# Patient Record
Sex: Female | Born: 1985 | Race: White | Hispanic: No | Smoking: Current every day smoker
Health system: Southern US, Community
[De-identification: ages and names within clinical notes are randomized; demographics above are authoritative.]

## PROBLEM LIST (undated history)

## (undated) DIAGNOSIS — I38 Endocarditis, valve unspecified: Secondary | ICD-10-CM

## (undated) DIAGNOSIS — N159 Renal tubulo-interstitial disease, unspecified: Secondary | ICD-10-CM

## (undated) DIAGNOSIS — F1910 Other psychoactive substance abuse, uncomplicated: Secondary | ICD-10-CM

## (undated) HISTORY — PX: KIDNEY STONE SURGERY: SHX686

## (undated) HISTORY — PX: ABDOMINAL HYSTERECTOMY: SHX81

## (undated) HISTORY — PX: MITRAL VALVE REPLACEMENT: SHX147

## (undated) MED FILL — lidocaine (PF) 20 mg/mL (2 %) intravenous solution: INTRAVENOUS | Qty: 3.35 | Status: AC

## (undated) MED FILL — microplegic 7.84 %-8.56 % (0.92 molar)-CP2D perfusion solution: Qty: 29.4 | Status: AC

## (undated) MED FILL — potassium chloride 2 mEq/mL intravenous solution: INTRAVENOUS | Qty: 30 | Status: AC

---
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
## 2009-07-24 ENCOUNTER — Ambulatory Visit: Payer: Self-pay | Admitting: Obstetrics & Gynecology

## 2009-08-01 ENCOUNTER — Ambulatory Visit: Payer: Self-pay | Admitting: Obstetrics & Gynecology

## 2010-12-23 ENCOUNTER — Emergency Department (HOSPITAL_COMMUNITY)
Admission: EM | Admit: 2010-12-23 | Discharge: 2010-12-23 | Disposition: A | Discharge status: Home / Self Care | Payer: Self-pay | Attending: Emergency Medicine | Admitting: Emergency Medicine

## 2010-12-23 ENCOUNTER — Emergency Department (HOSPITAL_COMMUNITY): Payer: Self-pay

## 2010-12-23 DIAGNOSIS — N12 Tubulo-interstitial nephritis, not specified as acute or chronic: Secondary | ICD-10-CM | POA: Insufficient documentation

## 2010-12-23 DIAGNOSIS — R110 Nausea: Secondary | ICD-10-CM | POA: Insufficient documentation

## 2010-12-23 DIAGNOSIS — Z79899 Other long term (current) drug therapy: Secondary | ICD-10-CM | POA: Insufficient documentation

## 2010-12-23 LAB — URINALYSIS, ROUTINE W REFLEX MICROSCOPIC
Glucose, UA: NEGATIVE mg/dL
Specific Gravity, Urine: 1.023 (ref 1.005–1.030)
pH: 6 (ref 5.0–8.0)

## 2010-12-23 LAB — BASIC METABOLIC PANEL
CO2: 28 mEq/L (ref 19–32)
Calcium: 9 mg/dL (ref 8.4–10.5)
Sodium: 135 mEq/L (ref 135–145)

## 2010-12-23 LAB — DIFFERENTIAL
Basophils Relative: 0 % (ref 0–1)
Eosinophils Absolute: 0.1 10*3/uL (ref 0.0–0.7)
Monocytes Relative: 9 % (ref 3–12)
Neutrophils Relative %: 72 % (ref 43–77)

## 2010-12-23 LAB — URINE MICROSCOPIC-ADD ON

## 2010-12-23 LAB — CBC
MCH: 31.1 pg (ref 26.0–34.0)
Platelets: 133 10*3/uL — ABNORMAL LOW (ref 150–400)
RBC: 4.7 MIL/uL (ref 3.87–5.11)
WBC: 7.4 10*3/uL (ref 4.0–10.5)

## 2010-12-25 LAB — URINE CULTURE

## 2011-05-04 ENCOUNTER — Emergency Department: Payer: Self-pay | Admitting: Emergency Medicine

## 2011-11-20 ENCOUNTER — Emergency Department: Payer: Self-pay | Admitting: *Deleted

## 2011-11-20 LAB — URINALYSIS, COMPLETE
Bacteria: NONE SEEN
Bilirubin,UR: NEGATIVE
Glucose,UR: NEGATIVE mg/dL (ref 0–75)
Ketone: NEGATIVE
Nitrite: NEGATIVE
Ph: 6 (ref 4.5–8.0)
Protein: 100
RBC,UR: 43 /HPF (ref 0–5)
Specific Gravity: 1.01 (ref 1.003–1.030)
Squamous Epithelial: 1
WBC UR: 164 /HPF (ref 0–5)

## 2011-11-20 LAB — COMPREHENSIVE METABOLIC PANEL
Albumin: 3.1 g/dL — ABNORMAL LOW (ref 3.4–5.0)
Alkaline Phosphatase: 80 U/L (ref 50–136)
Anion Gap: 6 — ABNORMAL LOW (ref 7–16)
BUN: 5 mg/dL — ABNORMAL LOW (ref 7–18)
Bilirubin,Total: 0.5 mg/dL (ref 0.2–1.0)
Calcium, Total: 8.4 mg/dL — ABNORMAL LOW (ref 8.5–10.1)
Chloride: 104 mmol/L (ref 98–107)
Co2: 28 mmol/L (ref 21–32)
Creatinine: 0.67 mg/dL (ref 0.60–1.30)
EGFR (African American): >60
EGFR (Non-African Amer.): >60
Glucose: 78 mg/dL (ref 65–99)
Osmolality: 272 (ref 275–301)
Potassium: 3.8 mmol/L (ref 3.5–5.1)
SGOT(AST): 40 U/L — ABNORMAL HIGH (ref 15–37)
SGPT (ALT): 35 U/L
Sodium: 138 mmol/L (ref 136–145)
Total Protein: 7 g/dL (ref 6.4–8.2)

## 2011-11-20 LAB — CBC
HCT: 36.4 % (ref 35.0–47.0)
HGB: 12.7 g/dL (ref 12.0–16.0)
MCH: 30.7 pg (ref 26.0–34.0)
MCHC: 34.8 g/dL (ref 32.0–36.0)
MCV: 88 fL (ref 80–100)
Platelet: 166 10*3/uL (ref 150–440)
RBC: 4.13 10*6/uL (ref 3.80–5.20)
RDW: 13.4 % (ref 11.5–14.5)
WBC: 8.6 10*3/uL (ref 3.6–11.0)

## 2011-11-22 LAB — URINE CULTURE

## 2012-06-28 IMAGING — CT CT ABD-PELV W/O CM
2 of 4 series · 14 of 32 positions shown, 19 images · non-contrast
Comparison: None.

CLINICAL DATA: Left-sided abdominal pain.  Flank pain.  History of
kidney stones.  Hematuria.

CT ABDOMEN AND PELVIS WITHOUT CONTRAST
TECHNIQUE: Multidetector CT imaging of the abdomen and pelvis was
performed following the standard protocol without intravenous
contrast.

[Series 2: renal stone · axial · 0.61mm/px · z∈[-411,-91]mm · 6 of 90 slices shown, 11 images]
[im 13/90  soft-tissue]
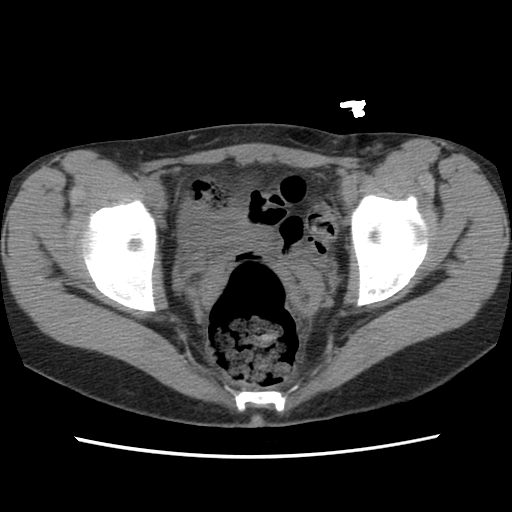
[im 13/90  bone]
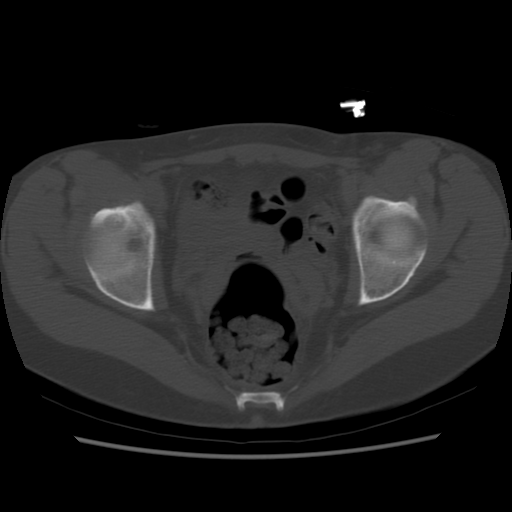
[im 26/90  soft-tissue]
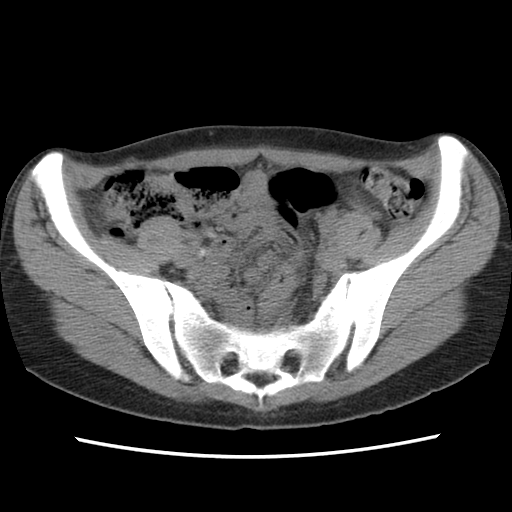
[im 39/90  soft-tissue]
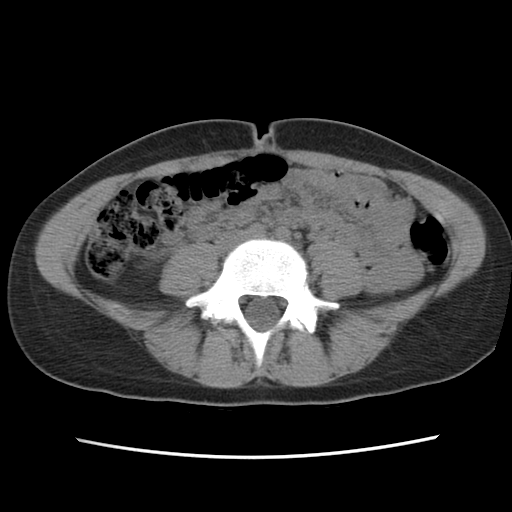
[im 39/90  lung]
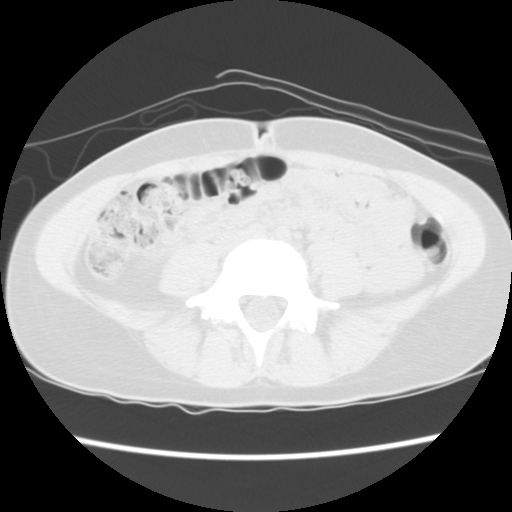
[im 51/90  soft-tissue]
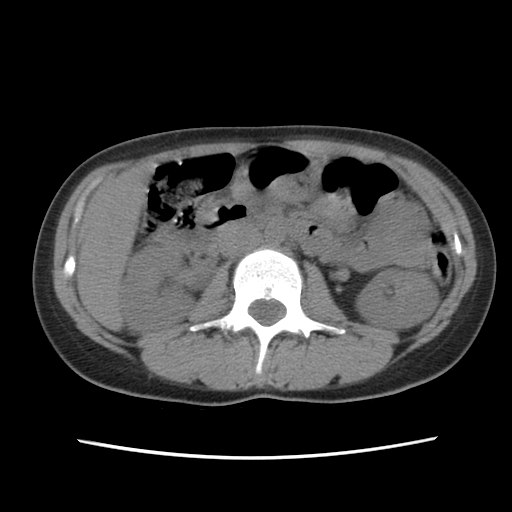
[im 51/90  lung]
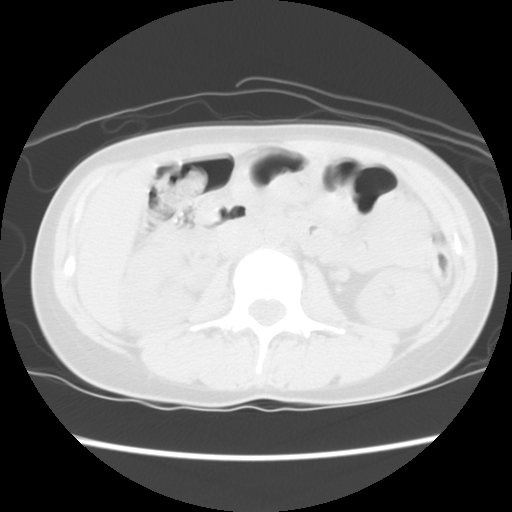
[im 64/90  soft-tissue]
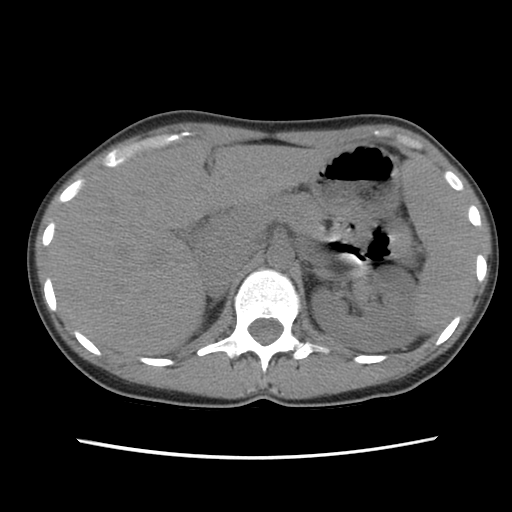
[im 64/90  lung]
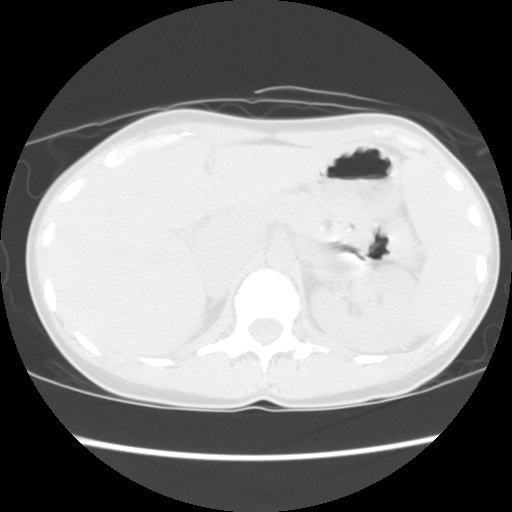
[im 77/90  soft-tissue]
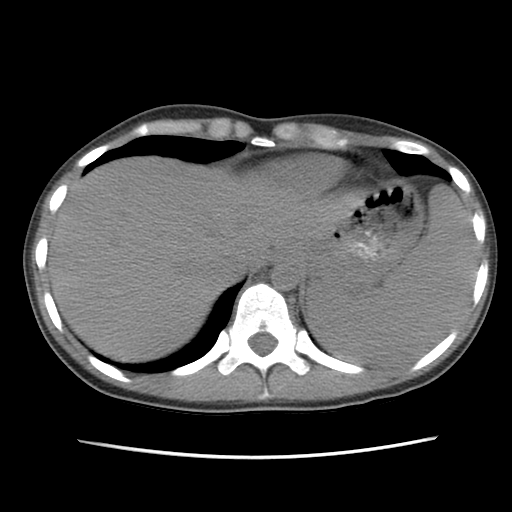
[im 77/90  lung]
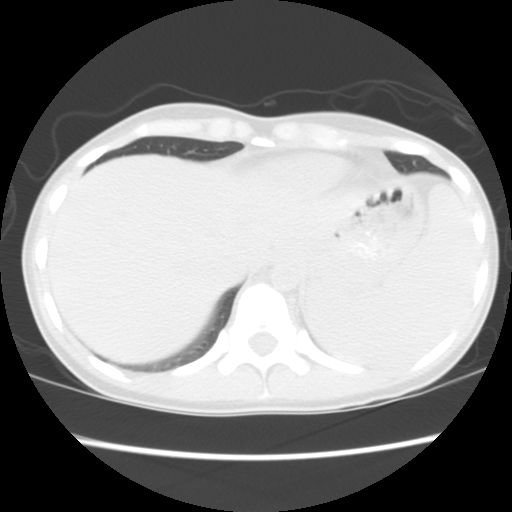

[Series 401: sag · sagittal · 0.95mm/px · 8 of 154 slices shown]
[im 13/154  soft-tissue]
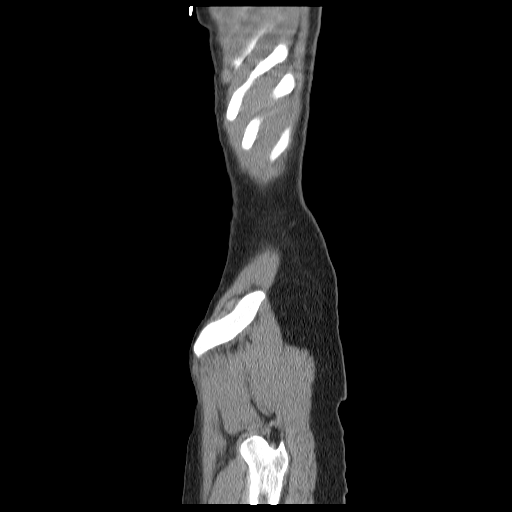
[im 39/154  soft-tissue]
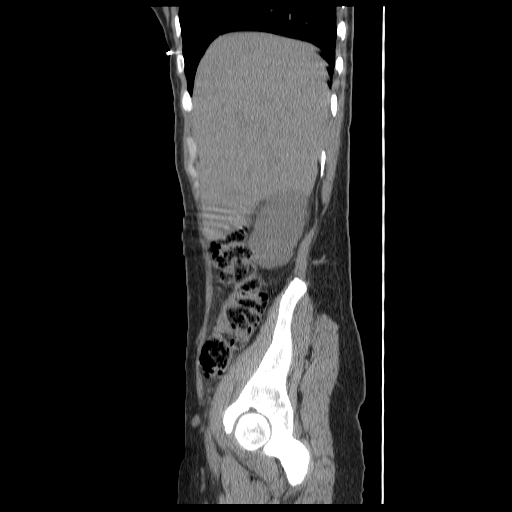
[im 52/154  soft-tissue]
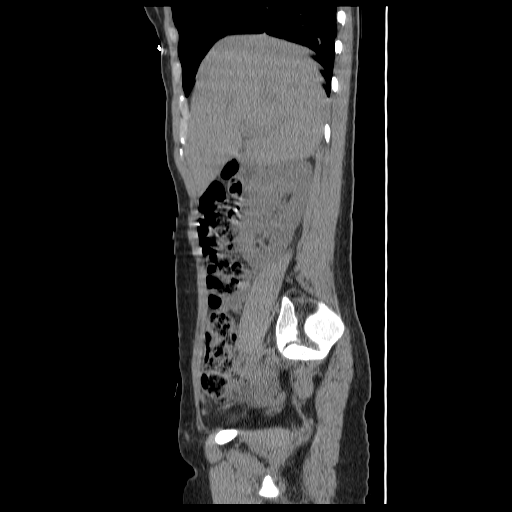
[im 64/154  soft-tissue]
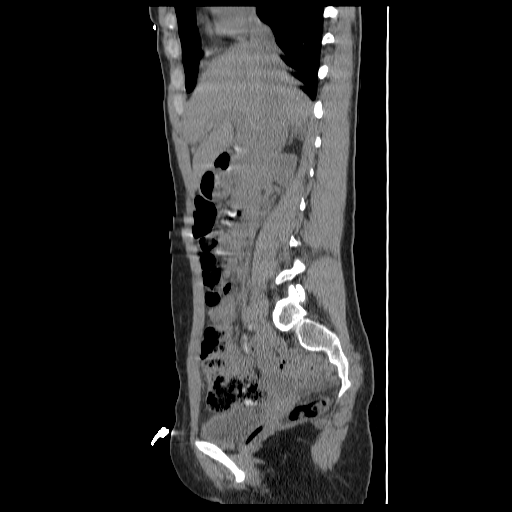
[im 90/154  soft-tissue]
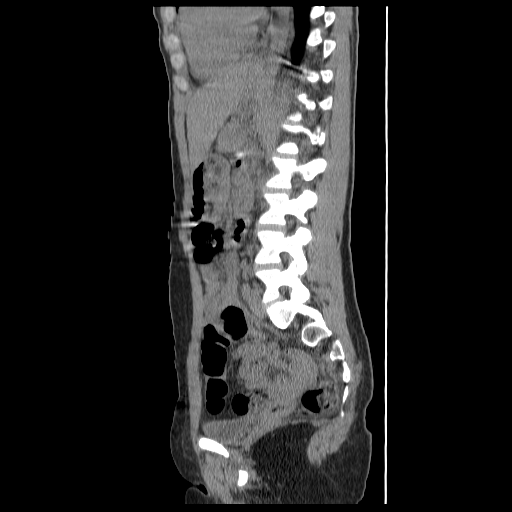
[im 103/154  soft-tissue]
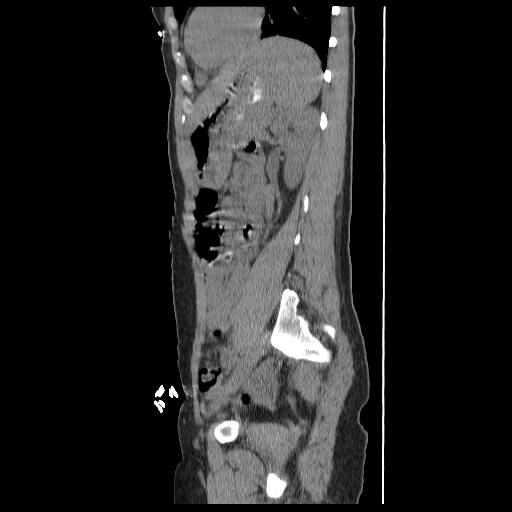
[im 115/154  soft-tissue]
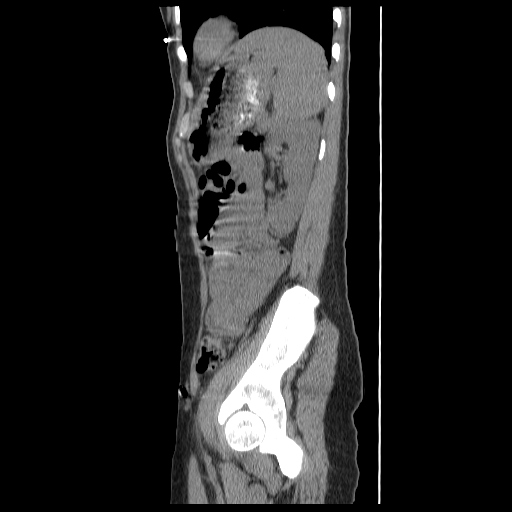
[im 141/154  soft-tissue]
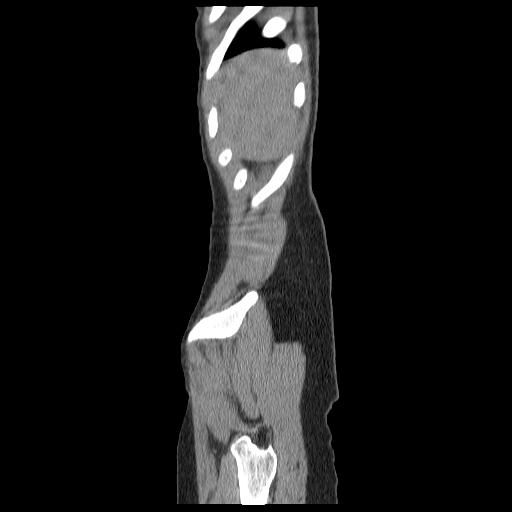

[14 of 32 positions shown; findings below may reference images not displayed]

FINDINGS: Lung bases clear.  Noncontrast appearance of liver and
spleen normal.  No renal calculi.  No obstruction or
hydronephrosis.  Urinary bladder appears within normal limits.
Large stool burden is present.  Normal appendix identified with
high density material contained within.  Small and large bowel
grossly appears normal.  Bilateral ovarian cysts are present.  Bone
windows are within normal limits.
IMPRESSION: No acute abnormality.  No urolithiasis.

## 2013-02-09 ENCOUNTER — Emergency Department (HOSPITAL_COMMUNITY)
Admission: EM | Admit: 2013-02-09 | Discharge: 2013-02-09 | Disposition: A | Discharge status: Home / Self Care | Payer: Self-pay | Attending: Emergency Medicine | Admitting: Emergency Medicine

## 2013-02-09 ENCOUNTER — Encounter (HOSPITAL_COMMUNITY): Payer: Self-pay | Admitting: Emergency Medicine

## 2013-02-09 DIAGNOSIS — T24219A Burn of second degree of unspecified thigh, initial encounter: Secondary | ICD-10-CM | POA: Insufficient documentation

## 2013-02-09 DIAGNOSIS — W38XXXA Explosion and rupture of other specified pressurized devices, initial encounter: Secondary | ICD-10-CM | POA: Insufficient documentation

## 2013-02-09 DIAGNOSIS — Y9289 Other specified places as the place of occurrence of the external cause: Secondary | ICD-10-CM | POA: Insufficient documentation

## 2013-02-09 DIAGNOSIS — F17200 Nicotine dependence, unspecified, uncomplicated: Secondary | ICD-10-CM | POA: Insufficient documentation

## 2013-02-09 DIAGNOSIS — T25229A Burn of second degree of unspecified foot, initial encounter: Secondary | ICD-10-CM | POA: Insufficient documentation

## 2013-02-09 DIAGNOSIS — T2112XA Burn of first degree of abdominal wall, initial encounter: Secondary | ICD-10-CM

## 2013-02-09 DIAGNOSIS — T2122XA Burn of second degree of abdominal wall, initial encounter: Secondary | ICD-10-CM | POA: Insufficient documentation

## 2013-02-09 DIAGNOSIS — Y9389 Activity, other specified: Secondary | ICD-10-CM | POA: Insufficient documentation

## 2013-02-09 DIAGNOSIS — T23179A Burn of first degree of unspecified wrist, initial encounter: Secondary | ICD-10-CM | POA: Insufficient documentation

## 2013-02-09 DIAGNOSIS — Z87442 Personal history of urinary calculi: Secondary | ICD-10-CM | POA: Insufficient documentation

## 2013-02-09 DIAGNOSIS — X12XXXA Contact with other hot fluids, initial encounter: Secondary | ICD-10-CM | POA: Insufficient documentation

## 2013-02-09 HISTORY — DX: Renal tubulo-interstitial disease, unspecified: N15.9

## 2013-02-09 MED ORDER — SILVER SULFADIAZINE 1 % EX CREA
TOPICAL_CREAM | Freq: Two times a day (BID) | CUTANEOUS | Status: DC
  Filled 2013-02-09: qty 85

## 2013-02-09 MED ORDER — FENTANYL CITRATE 0.05 MG/ML IJ SOLN
100.0000 ug | INTRAMUSCULAR | Status: DC | PRN
  Administered 2013-02-09 (×3): 100 ug via INTRAVENOUS
  Filled 2013-02-09 (×3): qty 2

## 2013-02-09 MED ORDER — FENTANYL CITRATE 0.05 MG/ML IJ SOLN: 50.0000 ug | INTRAMUSCULAR | Status: DC | PRN

## 2013-02-09 MED ORDER — SILVER SULFADIAZINE 1 % EX CREA: TOPICAL_CREAM | Freq: Every day | CUTANEOUS | Status: DC

## 2013-02-09 MED ORDER — CEPHALEXIN 500 MG PO CAPS: 500.0000 mg | ORAL_CAPSULE | Freq: Four times a day (QID) | ORAL | Status: DC

## 2013-02-09 MED ORDER — OXYCODONE-ACETAMINOPHEN 5-325 MG PO TABS
2.0000 | ORAL_TABLET | Freq: Once | ORAL | Status: AC
  Administered 2013-02-09: 2 via ORAL
  Filled 2013-02-09: qty 2

## 2013-02-09 MED ORDER — OXYCODONE-ACETAMINOPHEN 5-325 MG PO TABS: 1.0000 | ORAL_TABLET | ORAL | Status: DC | PRN
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
## 2013-02-09 NOTE — Progress Notes (Signed)
ED CM meet with patient in regards to PCP issues. Pt states, she is in transition, she is relocating from [place] to [place]. She is awaiting her Medicaid to be transferred to [facility]. Provided her with verbal and written information on the [facility] and [facility]. Explained the importance of a PCP for preventive,matinence, and follow up care, pt verbalizes understanding. Also provided the [place]. Pt very appreciative. No further CM needs identified
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
## 2013-02-12 ENCOUNTER — Emergency Department (HOSPITAL_COMMUNITY)
Admission: EM | Admit: 2013-02-12 | Discharge: 2013-02-13 | Disposition: A | Discharge status: Home / Self Care | Payer: Self-pay | Attending: Emergency Medicine | Admitting: Emergency Medicine

## 2013-02-12 ENCOUNTER — Encounter (HOSPITAL_COMMUNITY): Payer: Self-pay | Admitting: *Deleted

## 2013-02-12 DIAGNOSIS — Y929 Unspecified place or not applicable: Secondary | ICD-10-CM | POA: Insufficient documentation

## 2013-02-12 DIAGNOSIS — X12XXXA Contact with other hot fluids, initial encounter: Secondary | ICD-10-CM | POA: Insufficient documentation

## 2013-02-12 DIAGNOSIS — T2102XD Burn of unspecified degree of abdominal wall, subsequent encounter: Secondary | ICD-10-CM

## 2013-02-12 DIAGNOSIS — T2122XA Burn of second degree of abdominal wall, initial encounter: Secondary | ICD-10-CM | POA: Insufficient documentation

## 2013-02-12 DIAGNOSIS — Z87448 Personal history of other diseases of urinary system: Secondary | ICD-10-CM | POA: Insufficient documentation

## 2013-02-12 DIAGNOSIS — Y939 Activity, unspecified: Secondary | ICD-10-CM | POA: Insufficient documentation

## 2013-02-12 DIAGNOSIS — F17200 Nicotine dependence, unspecified, uncomplicated: Secondary | ICD-10-CM | POA: Insufficient documentation

## 2013-02-12 MED ORDER — OXYCODONE-ACETAMINOPHEN 5-325 MG PO TABS
2.0000 | ORAL_TABLET | Freq: Once | ORAL | Status: AC
  Administered 2013-02-12: 2 via ORAL
  Filled 2013-02-12: qty 2

## 2013-02-12 MED ORDER — LIDOCAINE-PRILOCAINE 2.5-2.5 % EX CREA
TOPICAL_CREAM | Freq: Once | CUTANEOUS | Status: AC
  Administered 2013-02-13: via TOPICAL
  Filled 2013-02-12: qty 5
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
## 2013-02-12 NOTE — ED Notes (Signed)
Nurse First Rounds: nurse explained delay , wait time and process to pt. No pain / respirations unlabored .
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
## 2013-02-13 MED ORDER — OXYCODONE-ACETAMINOPHEN 5-325 MG PO TABS: 1.0000 | ORAL_TABLET | ORAL | Status: DC | PRN

## 2013-02-13 MED ORDER — HYDROMORPHONE HCL PF 1 MG/ML IJ SOLN
1.0000 mg | Freq: Once | INTRAMUSCULAR | Status: AC
  Administered 2013-02-13: 1 mg via INTRAMUSCULAR
  Filled 2013-02-13: qty 1

## 2013-02-13 MED ORDER — HYDROMORPHONE HCL PF 1 MG/ML IJ SOLN: 1.0000 mg | Freq: Once | INTRAMUSCULAR | Status: DC
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
## 2013-02-13 NOTE — ED Notes (Signed)
Pt  Given pain med. emla cream applied to the pts abd especially over her blisters

## 2013-02-13 NOTE — ED Provider Notes (Deleted)
Medical screening examination/treatment/procedure(s) were conducted as a shared visit with non-physician practitioner(s) and myself.  I personally evaluated the patient during the encounter  Elyn Peers, MD 02/13/13 [phone]
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
## 2013-02-17 ENCOUNTER — Encounter (HOSPITAL_COMMUNITY): Payer: Self-pay | Admitting: *Deleted

## 2013-02-17 ENCOUNTER — Emergency Department (HOSPITAL_COMMUNITY)
Admission: EM | Admit: 2013-02-17 | Discharge: 2013-02-17 | Discharge status: Against Medical Advice | Payer: Self-pay | Attending: Emergency Medicine | Admitting: Emergency Medicine

## 2013-02-17 DIAGNOSIS — F17200 Nicotine dependence, unspecified, uncomplicated: Secondary | ICD-10-CM | POA: Insufficient documentation

## 2013-02-17 DIAGNOSIS — R404 Transient alteration of awareness: Secondary | ICD-10-CM | POA: Insufficient documentation

## 2013-02-17 NOTE — ED Notes (Signed)
Pt passed out and states she has not ate and drank 2 energy drinks.  Pt states that she has anemia and has passed out before.  NO pain.

## 2013-02-17 NOTE — ED Notes (Signed)
Nurse First: pt choosing to leave, alert, NAD, calm, interactive, speaking in clear complete sentences, walking out with sister and friend. walked out of exam/tx room in back, D30.

## 2013-02-17 NOTE — ED Notes (Signed)
Per friend when she passed out she curled up and was shaking and they thought she had a seizures.  No history of seizures

## 2019-04-15 ENCOUNTER — Emergency Department (HOSPITAL_COMMUNITY)
Admission: EM | Admit: 2019-04-15 | Discharge: 2019-04-15 | Disposition: A | Discharge status: Home / Self Care | Payer: Medicaid - Out of State | Attending: Emergency Medicine | Admitting: Emergency Medicine

## 2019-04-15 ENCOUNTER — Encounter (HOSPITAL_COMMUNITY): Payer: Self-pay

## 2019-04-15 ENCOUNTER — Other Ambulatory Visit: Payer: Self-pay

## 2019-04-15 DIAGNOSIS — F17210 Nicotine dependence, cigarettes, uncomplicated: Secondary | ICD-10-CM | POA: Insufficient documentation

## 2019-04-15 DIAGNOSIS — S41132S Puncture wound without foreign body of left upper arm, sequela: Secondary | ICD-10-CM

## 2019-04-15 DIAGNOSIS — M79632 Pain in left forearm: Secondary | ICD-10-CM | POA: Diagnosis not present

## 2019-04-15 DIAGNOSIS — S51832S Puncture wound without foreign body of left forearm, sequela: Secondary | ICD-10-CM | POA: Insufficient documentation

## 2019-04-15 DIAGNOSIS — W3400XS Accidental discharge from unspecified firearms or gun, sequela: Secondary | ICD-10-CM | POA: Insufficient documentation

## 2019-04-15 MED ORDER — OXYCODONE-ACETAMINOPHEN 5-325 MG PO TABS: 2.0000 | ORAL_TABLET | Freq: Four times a day (QID) | ORAL | 0 refills | Status: DC | PRN
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
## 2019-04-15 NOTE — Progress Notes (Signed)
Orthopedic Tech Progress Note Patient Details:  Robyn Shaffer Feb 27, 1986 [id number] went to find PA to asked about which splint to apply to patient and she said I could just reapply what she had on. Patient is from [place] and was not able to access patient records. So I applied a Volar splint to patient.  Ortho Devices Type of Ortho Device: Short arm splint Ortho Device/Splint Location: ULE Ortho Device/Splint Interventions: Application, Ordered, Adjustment   Post Interventions Patient Tolerated: Well Instructions Provided: Care of device, Adjustment of device   [name] 04/15/2019, 4:28 PM
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
## 2019-04-15 NOTE — ED Notes (Signed)
Ortho placed left short arm splint

## 2019-04-15 NOTE — ED Notes (Signed)
Called ortho to place short arm splint

## 2019-04-15 NOTE — Discharge Instructions (Signed)
Keep your arm elevated as much as possible. Take ibuprofen every 6 hours for pain and swelling. Use the oxycodone for breakthrough pain.  The emergency department will be unable to provide any further prescriptions for controlled substance medication regarding this injury.  Please follow-up closely with the orthopedic specialist.

## 2021-01-22 ENCOUNTER — Encounter: Payer: Self-pay | Admitting: Emergency Medicine

## 2021-01-22 ENCOUNTER — Emergency Department: Payer: Medicaid - Out of State

## 2021-01-22 ENCOUNTER — Emergency Department
Admission: EM | Admit: 2021-01-22 | Discharge: 2021-01-22 | Discharge status: Against Medical Advice | Payer: Medicaid - Out of State | Attending: Student in an Organized Health Care Education/Training Program | Admitting: Student in an Organized Health Care Education/Training Program

## 2021-01-22 ENCOUNTER — Other Ambulatory Visit: Payer: Self-pay

## 2021-01-22 DIAGNOSIS — Z2239 Carrier of other specified bacterial diseases: Secondary | ICD-10-CM

## 2021-01-22 DIAGNOSIS — M79605 Pain in left leg: Secondary | ICD-10-CM | POA: Insufficient documentation

## 2021-01-22 DIAGNOSIS — E871 Hypo-osmolality and hyponatremia: Secondary | ICD-10-CM | POA: Diagnosis not present

## 2021-01-22 DIAGNOSIS — Z87442 Personal history of urinary calculi: Secondary | ICD-10-CM | POA: Insufficient documentation

## 2021-01-22 DIAGNOSIS — M79604 Pain in right leg: Secondary | ICD-10-CM | POA: Insufficient documentation

## 2021-01-22 DIAGNOSIS — F17210 Nicotine dependence, cigarettes, uncomplicated: Secondary | ICD-10-CM | POA: Diagnosis not present

## 2021-01-22 DIAGNOSIS — N12 Tubulo-interstitial nephritis, not specified as acute or chronic: Secondary | ICD-10-CM | POA: Insufficient documentation

## 2021-01-22 DIAGNOSIS — R109 Unspecified abdominal pain: Secondary | ICD-10-CM | POA: Diagnosis present

## 2021-01-22 HISTORY — DX: Carrier of other specified bacterial diseases: Z22.39

## 2021-01-22 LAB — COMPREHENSIVE METABOLIC PANEL
ALT: 51 U/L — ABNORMAL HIGH (ref 0–44)
AST: 59 U/L — ABNORMAL HIGH (ref 15–41)
Albumin: 4 g/dL (ref 3.5–5.0)
Alkaline Phosphatase: 83 U/L (ref 38–126)
Anion gap: 7 (ref 5–15)
BUN: 25 mg/dL — ABNORMAL HIGH (ref 6–20)
CO2: 26 mmol/L (ref 22–32)
Calcium: 8.7 mg/dL — ABNORMAL LOW (ref 8.9–10.3)
Chloride: 97 mmol/L — ABNORMAL LOW (ref 98–111)
Creatinine, Ser: 1.43 mg/dL — ABNORMAL HIGH (ref 0.44–1.00)
GFR, Estimated: 49 mL/min — ABNORMAL LOW (ref >60)
Glucose, Bld: 120 mg/dL — ABNORMAL HIGH (ref 70–99)
Potassium: 4.4 mmol/L (ref 3.5–5.1)
Sodium: 130 mmol/L — ABNORMAL LOW (ref 135–145)
Total Bilirubin: 0.8 mg/dL (ref 0.3–1.2)
Total Protein: 8 g/dL (ref 6.5–8.1)

## 2021-01-22 LAB — URINALYSIS, COMPLETE (UACMP) WITH MICROSCOPIC
Bilirubin Urine: NEGATIVE
Glucose, UA: NEGATIVE mg/dL
Ketones, ur: NEGATIVE mg/dL
Nitrite: NEGATIVE
Protein, ur: 100 mg/dL — AB
RBC / HPF: >50 RBC/hpf — ABNORMAL HIGH (ref 0–5)
Specific Gravity, Urine: 1.016 (ref 1.005–1.030)
WBC, UA: >50 WBC/hpf — ABNORMAL HIGH (ref 0–5)
pH: 5 (ref 5.0–8.0)

## 2021-01-22 LAB — CBC WITH DIFFERENTIAL/PLATELET
Abs Immature Granulocytes: 0.05 10*3/uL (ref 0.00–0.07)
Basophils Absolute: 0 10*3/uL (ref 0.0–0.1)
Basophils Relative: 0 %
Eosinophils Absolute: 0 10*3/uL (ref 0.0–0.5)
Eosinophils Relative: 0 %
HCT: 35.5 % — ABNORMAL LOW (ref 36.0–46.0)
Hemoglobin: 10.9 g/dL — ABNORMAL LOW (ref 12.0–15.0)
Immature Granulocytes: 1 %
Lymphocytes Relative: 7 %
Lymphs Abs: 0.7 10*3/uL (ref 0.7–4.0)
MCH: 22 pg — ABNORMAL LOW (ref 26.0–34.0)
MCHC: 30.7 g/dL (ref 30.0–36.0)
MCV: 71.6 fL — ABNORMAL LOW (ref 80.0–100.0)
Monocytes Absolute: 0.7 10*3/uL (ref 0.1–1.0)
Monocytes Relative: 7 %
Neutro Abs: 8.5 10*3/uL — ABNORMAL HIGH (ref 1.7–7.7)
Neutrophils Relative %: 85 %
Platelets: 134 10*3/uL — ABNORMAL LOW (ref 150–400)
RBC: 4.96 MIL/uL (ref 3.87–5.11)
RDW: 17.6 % — ABNORMAL HIGH (ref 11.5–15.5)
WBC: 10 10*3/uL (ref 4.0–10.5)
nRBC: 0 % (ref 0.0–0.2)

## 2021-01-22 LAB — POC URINE PREG, ED: Preg Test, Ur: NEGATIVE

## 2021-01-22 MED ORDER — SODIUM CHLORIDE 0.9 % IV BOLUS
1000.0000 mL | Freq: Once | INTRAVENOUS | Status: AC
  Administered 2021-01-22: 1000 mL via INTRAVENOUS

## 2021-01-22 MED ORDER — SODIUM CHLORIDE 0.9 % IV SOLN
1.0000 g | Freq: Once | INTRAVENOUS | Status: AC
  Administered 2021-01-22: 1 g via INTRAVENOUS
  Filled 2021-01-22: qty 10

## 2021-01-22 MED ORDER — CEPHALEXIN 500 MG PO CAPS: 500.0000 mg | ORAL_CAPSULE | Freq: Four times a day (QID) | ORAL | 0 refills | Status: AC

## 2021-01-22 NOTE — Discharge Instructions (Addendum)
Take Keflex four times daily for the next seven days.  
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
## 2021-01-22 NOTE — ED Notes (Signed)
Sent a rainbow with gray on ICE to lab.

## 2021-01-22 NOTE — ED Provider Notes (Signed)
[facility]-EMERGENCY DEPARTMENT  ____________________________________________  Time seen: Approximately 6:00 PM  I have reviewed the triage vital signs and the nursing notes.   HISTORY  Chief Complaint Leg Pain and Hematuria   Historian Patient     HPI Robyn Shaffer is a 35 y.o. female presents to the emergency department with hematuria and left-sided flank pain.  Patient endorses a history of pyelonephritis and nephrolithiasis.  She reports that she has had chills and some dysuria at home.  No chest pain, chest tightness or abdominal pain.     Past Medical History:  Diagnosis Date   Kidney infection      Immunizations up to date:  Yes.     Past Medical History:  Diagnosis Date   Kidney infection     There are no problems to display for this patient.   Past Surgical History:  Procedure Laterality Date   ABDOMINAL HYSTERECTOMY     KIDNEY STONE SURGERY      Prior to Admission medications   Medication Sig Start Date End Date Taking? Authorizing Provider  cephALEXin (KEFLEX) 500 MG capsule Take 1 capsule (500 mg total) by mouth 4 (four) times daily for 7 days. 01/22/21 01/29/21 Yes Lannie Fields, PA-C    Allergies Patient has no known allergies.  No family history on file.  Social History Social History   Tobacco Use   Smoking status: Every Day    Types: Cigarettes  Substance Use Topics   Drug use: No     Review of Systems  Constitutional: No fever/chills Eyes:  No discharge ENT: No upper respiratory complaints. Respiratory: no cough. No SOB/ use of accessory muscles to breath Gastrointestinal:  Patient has hematuria.  Musculoskeletal: Negative for musculoskeletal pain.* Skin: Negative for rash, abrasions, lacerations, ecchymosis.    ____________________________________________   PHYSICAL EXAM:  VITAL SIGNS: ED Triage Vitals  Enc Vitals Group     BP 01/22/21 1630 129/84     Pulse Rate 01/22/21 1630 (!) 53     Resp 01/22/21 1630 20      Temp 01/22/21 1630 97.9 F (36.6 C)     Temp Source 01/22/21 1630 Oral     SpO2 01/22/21 1630 97 %     Weight 01/22/21 1631 157 lb (71.2 kg)     Height 01/22/21 1631 '5\' 11"'$  (1.803 m)     Head Circumference --      Peak Flow --      Pain Score 01/22/21 1631 7     Pain Loc --      Pain Edu? --      Excl. in [place]? --      Constitutional: Alert and oriented. Well appearing and in no acute distress. Eyes: Conjunctivae are normal. PERRL. EOMI. Head: Atraumatic. ENT: Cardiovascular: Normal rate, regular rhythm. Normal S1 and S2.  Good peripheral circulation. Respiratory: Normal respiratory effort without tachypnea or retractions. Lungs CTAB. Good air entry to the bases with no decreased or absent breath sounds Gastrointestinal: Bowel sounds x 4 quadrants. Soft and nontender to palpation. No guarding or rigidity. No distention. Patient has left sided CVA tenderness.  Musculoskeletal: Full range of motion to all extremities. No obvious deformities noted Neurologic:  Normal for age. No gross focal neurologic deficits are appreciated.  Skin:  Skin is warm, dry and intact. No rash noted. Psychiatric: Mood and affect are normal for age. Speech and behavior are normal.   ____________________________________________   LABS (all labs ordered are listed, but only abnormal results are displayed)  Labs Reviewed  URINALYSIS, COMPLETE (UACMP) WITH MICROSCOPIC - Abnormal; Notable for the following components:      Result Value   Color, Urine AMBER (*)    APPearance TURBID (*)    Hgb urine dipstick LARGE (*)    Protein, ur 100 (*)    Leukocytes,Ua MODERATE (*)    RBC / HPF >50 (*)    WBC, UA >50 (*)    Bacteria, UA RARE (*)    All other components within normal limits  CBC WITH DIFFERENTIAL/PLATELET - Abnormal; Notable for the following components:   Hemoglobin 10.9 (*)    HCT 35.5 (*)    MCV 71.6 (*)    MCH 22.0 (*)    RDW 17.6 (*)    Platelets 134 (*)    Neutro Abs 8.5 (*)    All other  components within normal limits  COMPREHENSIVE METABOLIC PANEL - Abnormal; Notable for the following components:   Sodium 130 (*)    Chloride 97 (*)    Glucose, Bld 120 (*)    BUN 25 (*)    Creatinine, Ser 1.43 (*)    Calcium 8.7 (*)    AST 59 (*)    ALT 51 (*)    GFR, Estimated 49 (*)    All other components within normal limits  URINE CULTURE  POC URINE PREG, ED   ____________________________________________  EKG   ____________________________________________  RADIOLOGY Unk Pinto, personally viewed and evaluated these images (plain radiographs) as part of my medical decision making, as well as reviewing the written report by the radiologist.    CT Renal Stone Study  Result Date: 01/22/2021 CLINICAL DATA:  Bilateral leg pain and hematuria. EXAM: CT ABDOMEN AND PELVIS WITHOUT CONTRAST TECHNIQUE: Multidetector CT imaging of the abdomen and pelvis was performed following the standard protocol without IV contrast. COMPARISON:  CT November 20, 2011 FINDINGS: Lower chest: No acute abnormality. Hepatobiliary: Unremarkable noncontrast appearance of the hepatic parenchyma. Gallbladder is unremarkable. No biliary ductal dilation. Pancreas: Unremarkable noncontrast appearance of the pancreatic parenchyma. No pancreatic ductal dilation. Spleen: Within normal limits. Adrenals/Urinary Tract: Adrenal glands are unremarkable. Kidneys are normal, without renal calculi, contour deforming lesion, or hydronephrosis. Bladder is unremarkable. Stomach/Bowel: Stomach is within normal limits. Appendix is not confidently identified however there is no pericecal/right lower quadrant inflammation. No evidence of bowel wall thickening, distention, or inflammatory changes. Vascular/Lymphatic: No significant vascular findings are present. No pathologically enlarged abdominal or pelvic lymph nodes. Reproductive: No acute abnormality. Other: No abdominopelvic ascites.  No pneumoperitoneum. Musculoskeletal: No acute  osseous abnormality. IMPRESSION: No acute abdominopelvic findings. Specifically, no evidence of nephrolithiasis or obstructive uropathy. Electronically Signed   By: Dahlia Bailiff MD   On: 01/22/2021 18:06    ____________________________________________    PROCEDURES  Procedure(s) performed:     Procedures     Medications  sodium chloride 0.9 % bolus 1,000 mL (1,000 mLs Intravenous New Bag/Given 01/22/21 2120)  cefTRIAXone (ROCEPHIN) 1 g in sodium chloride 0.9 % 100 mL IVPB (1 g Intravenous New Bag/Given 01/22/21 2121)     ____________________________________________   INITIAL IMPRESSION / ASSESSMENT AND PLAN / ED COURSE  Pertinent labs & imaging results that were available during my care of the patient were reviewed by me and considered in my medical decision making (see chart for details).      Assessment and Plan:  Flank pain:  34 year old female presents to the emergency department with left-sided flank pain for the past 2 to 3 days. Patient  was bradycardic at triage but vital signs otherwise reassuring.  She had CVA tenderness on exam and expressed concern for nephrolithiasis.  CMP indicated hyponatremia and elevated creatinine compared to patient's baseline.  Patient had white blood cell count within reference range.  CT renal stone study showed no evidence of nephrolithiasis.  Urine pregnancy test was negative.  Patient was given IV Rocephin in the emergency department and was given supplemental fluids.  She was advised to increase hydration at home and to consume salty foods.  She was discharged with Keflex 4 times daily for the next 7 days.  Return precautions were given to return with new or worsening symptoms.       ____________________________________________  FINAL CLINICAL IMPRESSION(S) / ED DIAGNOSES  Final diagnoses:  Pyelonephritis      NEW MEDICATIONS STARTED DURING THIS VISIT:  ED Discharge Orders          Ordered    cephALEXin (KEFLEX) 500  MG capsule  4 times daily        01/22/21 2258                This chart was dictated using voice recognition software/Dragon. Despite best efforts to proofread, errors can occur which can change the meaning. Any change was purely unintentional.     Lannie Fields, PA-C 01/22/21 2304    Merlyn Lot, MD 01/25/21 [phone]

## 2021-01-22 NOTE — ED Notes (Signed)
Unsuccessful IV attempt x2, once in left antecubital and once in right antecubital. Aquilla Solian, PA aware, IV team consult ordered. Pt given crackers and sprite, agrees with plan to utilize IV team.

## 2021-01-25 LAB — URINE CULTURE: Culture: >=100000 — AB

## 2021-02-04 ENCOUNTER — Encounter (HOSPITAL_COMMUNITY): Payer: Self-pay

## 2021-02-04 ENCOUNTER — Other Ambulatory Visit: Payer: Self-pay

## 2021-02-04 NOTE — Care Management Notes (Signed)
Dr. Lacinda Axon connected with Dr. Dema Severin patient reviewed and accepted. The patient is a known drug user with tricuspid endocarditis, MRSA positive blood cultures and severe renal failure.

## 2021-02-05 ENCOUNTER — Inpatient Hospital Stay (HOSPITAL_COMMUNITY): Payer: Medicaid Other

## 2021-02-05 ENCOUNTER — Inpatient Hospital Stay (HOSPITAL_COMMUNITY): Payer: Medicaid Other | Admitting: THORACIC SURGERY CARDIOTHORACIC VASCULAR SURGERY

## 2021-02-05 ENCOUNTER — Inpatient Hospital Stay
Admission: AD | Admit: 2021-02-05 | Discharge: 2021-03-02 | DRG: 853 | Disposition: A | Discharge status: Home / Self Care | Payer: Medicaid Other | Source: Other Acute Inpatient Hospital | Attending: Internal Medicine | Admitting: Internal Medicine

## 2021-02-05 DIAGNOSIS — F32A Depression, unspecified: Secondary | ICD-10-CM | POA: Diagnosis present

## 2021-02-05 DIAGNOSIS — D631 Anemia in chronic kidney disease: Secondary | ICD-10-CM | POA: Diagnosis present

## 2021-02-05 DIAGNOSIS — R188 Other ascites: Secondary | ICD-10-CM

## 2021-02-05 DIAGNOSIS — I38 Endocarditis, valve unspecified: Secondary | ICD-10-CM | POA: Diagnosis present

## 2021-02-05 DIAGNOSIS — I330 Acute and subacute infective endocarditis: Secondary | ICD-10-CM

## 2021-02-05 DIAGNOSIS — M310 Hypersensitivity angiitis: Secondary | ICD-10-CM | POA: Diagnosis present

## 2021-02-05 DIAGNOSIS — D649 Anemia, unspecified: Secondary | ICD-10-CM

## 2021-02-05 DIAGNOSIS — R4182 Altered mental status, unspecified: Secondary | ICD-10-CM

## 2021-02-05 DIAGNOSIS — E871 Hypo-osmolality and hyponatremia: Secondary | ICD-10-CM | POA: Diagnosis not present

## 2021-02-05 DIAGNOSIS — E785 Hyperlipidemia, unspecified: Secondary | ICD-10-CM

## 2021-02-05 DIAGNOSIS — J942 Hemothorax: Secondary | ICD-10-CM | POA: Diagnosis not present

## 2021-02-05 DIAGNOSIS — Z79899 Other long term (current) drug therapy: Secondary | ICD-10-CM

## 2021-02-05 DIAGNOSIS — T401X1A Poisoning by heroin, accidental (unintentional), initial encounter: Secondary | ICD-10-CM

## 2021-02-05 DIAGNOSIS — I272 Pulmonary hypertension, unspecified: Secondary | ICD-10-CM | POA: Diagnosis present

## 2021-02-05 DIAGNOSIS — I011 Acute rheumatic endocarditis: Secondary | ICD-10-CM | POA: Diagnosis present

## 2021-02-05 DIAGNOSIS — I071 Rheumatic tricuspid insufficiency: Secondary | ICD-10-CM

## 2021-02-05 DIAGNOSIS — R918 Other nonspecific abnormal finding of lung field: Secondary | ICD-10-CM

## 2021-02-05 DIAGNOSIS — Z8659 Personal history of other mental and behavioral disorders: Secondary | ICD-10-CM

## 2021-02-05 DIAGNOSIS — I2690 Septic pulmonary embolism without acute cor pulmonale: Secondary | ICD-10-CM | POA: Diagnosis present

## 2021-02-05 DIAGNOSIS — F1190 Opioid use, unspecified, uncomplicated: Secondary | ICD-10-CM

## 2021-02-05 DIAGNOSIS — Z9911 Dependence on respirator [ventilator] status: Secondary | ICD-10-CM

## 2021-02-05 DIAGNOSIS — R6521 Severe sepsis with septic shock: Secondary | ICD-10-CM | POA: Diagnosis present

## 2021-02-05 DIAGNOSIS — F1510 Other stimulant abuse, uncomplicated: Secondary | ICD-10-CM | POA: Diagnosis present

## 2021-02-05 DIAGNOSIS — D692 Other nonthrombocytopenic purpura: Secondary | ICD-10-CM | POA: Diagnosis present

## 2021-02-05 DIAGNOSIS — Z87442 Personal history of urinary calculi: Secondary | ICD-10-CM

## 2021-02-05 DIAGNOSIS — R0902 Hypoxemia: Secondary | ICD-10-CM

## 2021-02-05 DIAGNOSIS — Z9889 Other specified postprocedural states: Secondary | ICD-10-CM

## 2021-02-05 DIAGNOSIS — F419 Anxiety disorder, unspecified: Secondary | ICD-10-CM | POA: Diagnosis present

## 2021-02-05 DIAGNOSIS — T43621A Poisoning by amphetamines, accidental (unintentional), initial encounter: Secondary | ICD-10-CM

## 2021-02-05 DIAGNOSIS — J15212 Pneumonia due to Methicillin resistant Staphylococcus aureus: Secondary | ICD-10-CM | POA: Diagnosis present

## 2021-02-05 DIAGNOSIS — J9601 Acute respiratory failure with hypoxia: Secondary | ICD-10-CM | POA: Diagnosis present

## 2021-02-05 DIAGNOSIS — D696 Thrombocytopenia, unspecified: Secondary | ICD-10-CM | POA: Diagnosis present

## 2021-02-05 DIAGNOSIS — Z01818 Encounter for other preprocedural examination: Secondary | ICD-10-CM

## 2021-02-05 DIAGNOSIS — J9811 Atelectasis: Secondary | ICD-10-CM | POA: Diagnosis not present

## 2021-02-05 DIAGNOSIS — F17210 Nicotine dependence, cigarettes, uncomplicated: Secondary | ICD-10-CM | POA: Diagnosis present

## 2021-02-05 DIAGNOSIS — A4902 Methicillin resistant Staphylococcus aureus infection, unspecified site: Secondary | ICD-10-CM

## 2021-02-05 DIAGNOSIS — Z6826 Body mass index (BMI) 26.0-26.9, adult: Secondary | ICD-10-CM

## 2021-02-05 DIAGNOSIS — E8770 Fluid overload, unspecified: Secondary | ICD-10-CM | POA: Diagnosis present

## 2021-02-05 DIAGNOSIS — F1110 Opioid abuse, uncomplicated: Secondary | ICD-10-CM | POA: Diagnosis present

## 2021-02-05 DIAGNOSIS — D509 Iron deficiency anemia, unspecified: Secondary | ICD-10-CM | POA: Diagnosis present

## 2021-02-05 DIAGNOSIS — K029 Dental caries, unspecified: Secondary | ICD-10-CM | POA: Diagnosis present

## 2021-02-05 DIAGNOSIS — E875 Hyperkalemia: Secondary | ICD-10-CM | POA: Diagnosis present

## 2021-02-05 DIAGNOSIS — D62 Acute posthemorrhagic anemia: Secondary | ICD-10-CM | POA: Diagnosis not present

## 2021-02-05 DIAGNOSIS — Z954 Presence of other heart-valve replacement: Secondary | ICD-10-CM

## 2021-02-05 DIAGNOSIS — I76 Septic arterial embolism: Secondary | ICD-10-CM | POA: Diagnosis present

## 2021-02-05 DIAGNOSIS — D849 Immunodeficiency, unspecified: Secondary | ICD-10-CM

## 2021-02-05 DIAGNOSIS — J90 Pleural effusion, not elsewhere classified: Secondary | ICD-10-CM | POA: Diagnosis present

## 2021-02-05 DIAGNOSIS — Z20822 Contact with and (suspected) exposure to covid-19: Secondary | ICD-10-CM | POA: Diagnosis present

## 2021-02-05 DIAGNOSIS — R162 Hepatomegaly with splenomegaly, not elsewhere classified: Secondary | ICD-10-CM

## 2021-02-05 DIAGNOSIS — A4102 Sepsis due to Methicillin resistant Staphylococcus aureus: Principal | ICD-10-CM | POA: Diagnosis present

## 2021-02-05 DIAGNOSIS — R1310 Dysphagia, unspecified: Secondary | ICD-10-CM | POA: Diagnosis present

## 2021-02-05 DIAGNOSIS — Z01810 Encounter for preprocedural cardiovascular examination: Secondary | ICD-10-CM

## 2021-02-05 DIAGNOSIS — G9341 Metabolic encephalopathy: Secondary | ICD-10-CM | POA: Diagnosis present

## 2021-02-05 DIAGNOSIS — Z4682 Encounter for fitting and adjustment of non-vascular catheter: Secondary | ICD-10-CM

## 2021-02-05 DIAGNOSIS — R601 Generalized edema: Secondary | ICD-10-CM

## 2021-02-05 DIAGNOSIS — J690 Pneumonitis due to inhalation of food and vomit: Secondary | ICD-10-CM | POA: Diagnosis present

## 2021-02-05 DIAGNOSIS — K027 Dental root caries: Secondary | ICD-10-CM

## 2021-02-05 DIAGNOSIS — R21 Rash and other nonspecific skin eruption: Secondary | ICD-10-CM | POA: Diagnosis not present

## 2021-02-05 DIAGNOSIS — R5381 Other malaise: Secondary | ICD-10-CM

## 2021-02-05 DIAGNOSIS — B957 Other staphylococcus as the cause of diseases classified elsewhere: Secondary | ICD-10-CM | POA: Diagnosis present

## 2021-02-05 DIAGNOSIS — A419 Sepsis, unspecified organism: Secondary | ICD-10-CM

## 2021-02-05 DIAGNOSIS — F1520 Other stimulant dependence, uncomplicated: Secondary | ICD-10-CM

## 2021-02-05 DIAGNOSIS — R739 Hyperglycemia, unspecified: Secondary | ICD-10-CM | POA: Diagnosis not present

## 2021-02-05 DIAGNOSIS — R5383 Other fatigue: Secondary | ICD-10-CM

## 2021-02-05 DIAGNOSIS — N179 Acute kidney failure, unspecified: Secondary | ICD-10-CM | POA: Diagnosis present

## 2021-02-05 DIAGNOSIS — N1831 Chronic kidney disease, stage 3a: Secondary | ICD-10-CM | POA: Diagnosis present

## 2021-02-05 DIAGNOSIS — I129 Hypertensive chronic kidney disease with stage 1 through stage 4 chronic kidney disease, or unspecified chronic kidney disease: Secondary | ICD-10-CM | POA: Diagnosis present

## 2021-02-05 DIAGNOSIS — G4700 Insomnia, unspecified: Secondary | ICD-10-CM | POA: Diagnosis present

## 2021-02-05 DIAGNOSIS — Q211 Atrial septal defect: Secondary | ICD-10-CM

## 2021-02-05 DIAGNOSIS — K5900 Constipation, unspecified: Secondary | ICD-10-CM | POA: Diagnosis present

## 2021-02-05 HISTORY — DX: Methicillin resistant Staphylococcus aureus infection, unspecified site: A49.02

## 2021-02-05 LAB — ECG 12-LEAD
Atrial Rate: 99 {beats}/min
Calculated P Axis: 41 degrees
Calculated R Axis: 13 degrees
Calculated T Axis: 21 degrees
PR Interval: 136 ms
QRS Duration: 86 ms
QT Interval: 338 ms
QTC Calculation: 433 ms
Ventricular rate: 99 {beats}/min

## 2021-02-05 LAB — HEPATITIS B SURFACE ANTIGEN: HBV SURFACE ANTIGEN QUALITATIVE: NEGATIVE

## 2021-02-05 LAB — VANCOMYCIN, RANDOM: VANCOMYCIN RANDOM: 24.4 ug/mL — ABNORMAL LOW (ref 30.0–40.0)

## 2021-02-05 LAB — BASIC METABOLIC PANEL
ANION GAP: 13 mmol/L (ref 4–13)
BUN/CREA RATIO: 9 (ref 6–22)
BUN: 63 mg/dL — ABNORMAL HIGH (ref 8–25)
CALCIUM: 7.7 mg/dL — ABNORMAL LOW (ref 8.5–10.0)
CHLORIDE: 103 mmol/L (ref 96–111)
CO2 TOTAL: 21 mmol/L — ABNORMAL LOW (ref 22–30)
CREATININE: 6.79 mg/dL — ABNORMAL HIGH (ref 0.60–1.05)
ESTIMATED GFR: 8 mL/min/BSA — ABNORMAL LOW (ref >=60)
GLUCOSE: 117 mg/dL (ref 65–125)
POTASSIUM: 3.6 mmol/L (ref 3.5–5.1)
SODIUM: 137 mmol/L (ref 136–145)

## 2021-02-05 LAB — URINALYSIS, MICROSCOPIC
HYALINE CASTS: 33 /lpf — ABNORMAL HIGH (ref <4.0)
RBCS: >182 /hpf — ABNORMAL HIGH (ref <6.0)
WBCS: >182 /hpf — ABNORMAL HIGH (ref <11.0)

## 2021-02-05 LAB — LIPID PANEL
CHOLESTEROL: 96 mg/dL — ABNORMAL LOW (ref 100–200)
HDL CHOL: <5 mg/dL — ABNORMAL LOW (ref >=50)
TRIGLYCERIDES: 214 mg/dL — ABNORMAL HIGH (ref <150)
VLDL CALC: 43 mg/dL — ABNORMAL HIGH (ref <30)

## 2021-02-05 LAB — TRANSTHORACIC ECHOCARDIOGRAM - ADULT
Aortic Valve Area by Continuity of Peak Velocity: 2.89 cm2
Aortic Valve Area by Continuity of VTI: 2.84 cm2
FS: 32 percent (ref 28–44)
IVS: 0.88 cm (ref 0.6–1.1)
LV Diastolic Volume Index: 104.9 milliliter
LV Diastolic Volume Index: 79.4 milliliter
LV Diastolic Volume Index: 89.6 milliliter
LV Diastolic Volume Index: 99.8 milliliter
LV Systolic Volume Index: 27.3 milliliter
LV Systolic Volume Index: 33.5 milliliter
LV Systolic Volume Index: 38.7 milliliter
LV Systolic Volume Index: 42.5 milliliter
LVIDD: 4.75 cm (ref 3.5–6.0)
LVIDS: 3.25 cm (ref 2.1–4.0)
LVOT diameter: 2.15 cm
LVOT stroke volume: 46.22 milliliter
LVPWD: 1.02 cm
MV Peak E Vel: 81.86 cm/s
Pulmonic Valve Acceleration Time: 81.35 ms
RVDD: 2.96 cm
TR VELOCITY: 265.66 cm/s
TR VELOCITY: 281.98 cm/s
TR VELOCITY: 285.67 cm/s
TR VELOCITY: 285.67 cm/s

## 2021-02-05 LAB — HEPATIC FUNCTION PANEL
ALBUMIN: 1.3 g/dL — ABNORMAL LOW (ref 3.5–5.0)
ALKALINE PHOSPHATASE: 163 U/L — ABNORMAL HIGH (ref 40–110)
ALT (SGPT): 10 U/L (ref 8–22)
AST (SGOT): 18 U/L (ref 8–45)
BILIRUBIN DIRECT: 0.4 mg/dL (ref 0.1–0.4)
BILIRUBIN TOTAL: 0.6 mg/dL (ref 0.3–1.3)
PROTEIN TOTAL: 6.1 g/dL — ABNORMAL LOW (ref 6.4–8.3)

## 2021-02-05 LAB — VENOUS BLOOD GAS, CO-OX, LYTES, LACTATE REFLEX
%FIO2 (VENOUS): 50 %
BASE DEFICIT: 3.2 mmol/L — ABNORMAL HIGH (ref <=3.0)
BICARBONATE (VENOUS): 22 mmol/L (ref 22.0–26.0)
CARBOXYHEMOGLOBIN: 1.8 % (ref 0.0–2.5)
CHLORIDE: 102 mmol/L (ref 101–111)
GLUCOSE: 81 mg/dL (ref 60–105)
HEMOGLOBIN: 11.9 g/dL — ABNORMAL LOW (ref 12.0–18.0)
IONIZED CALCIUM: 1.11 mmol/L (ref 1.10–1.35)
LACTATE: 1 mmol/L (ref 0.0–1.3)
O2CT: 12.9 % (ref 6.7–17.5)
OXYHEMOGLOBIN: 76.9 % (ref 40.0–80.0)
PCO2 (VENOUS): 35 mm/Hg — ABNORMAL LOW (ref 41–51)
PH (VENOUS): 7.39 (ref 7.31–7.41)
PO2 (VENOUS): 50 mm/Hg (ref 35–50)
SODIUM: 134 mmol/L — ABNORMAL LOW (ref 137–145)
WHOLE BLOOD POTASSIUM: 3.9 mmol/L (ref 3.5–4.6)

## 2021-02-05 LAB — MANUAL DIFF AND MORPHOLOGY-SYSMEX
BASOPHIL #: <0.1 10*3/uL (ref <=0.20)
BASOPHIL %: 0 %
EOSINOPHIL #: <0.1 10*3/uL (ref <=0.50)
EOSINOPHIL %: 0 %
LYMPHOCYTE #: 0.23 10*3/uL — ABNORMAL LOW (ref 1.00–4.80)
LYMPHOCYTE %: 2 %
METAMYELOCYTE %: 1 %
MONOCYTE #: 0.45 10*3/uL (ref 0.20–1.10)
MONOCYTE %: 4 %
NEUTROPHIL #: 10.62 10*3/uL — ABNORMAL HIGH (ref 1.50–7.70)
NEUTROPHIL %: 92 %
NEUTROPHIL BANDS %: 2 %

## 2021-02-05 LAB — CBC WITH DIFF
HCT: 25.7 % — ABNORMAL LOW (ref 34.8–46.0)
HGB: 8.5 g/dL — ABNORMAL LOW (ref 11.5–16.0)
MCH: 23.5 pg — ABNORMAL LOW (ref 26.0–32.0)
MCHC: 33.1 g/dL (ref 31.0–35.5)
MCV: 71.2 fL — ABNORMAL LOW (ref 78.0–100.0)
PLATELETS: 88 10*3/uL — ABNORMAL LOW (ref 150–400)
RBC: 3.61 10*6/uL — ABNORMAL LOW (ref 3.85–5.22)
RDW-CV: 22.6 % — ABNORMAL HIGH (ref 11.5–15.5)
WBC: 11.3 10*3/uL — ABNORMAL HIGH (ref 3.7–11.0)

## 2021-02-05 LAB — URINALYSIS, MACROSCOPIC
GLUCOSE: NEGATIVE mg/dL
KETONES: 10 mg/dL — AB
NITRITE: NEGATIVE
PH: 5.5 (ref 5.0–8.0)
PROTEIN: >=600 mg/dL — AB
SPECIFIC GRAVITY: 1.034 — ABNORMAL HIGH (ref 1.005–1.030)
UROBILINOGEN: NEGATIVE mg/dL

## 2021-02-05 LAB — C-REACTIVE PROTEIN(CRP),INFLAMMATION: CRP INFLAMMATION: 207.9 mg/L — ABNORMAL HIGH (ref <8.0)

## 2021-02-05 LAB — ELECTROLYTES
ANION GAP: 13 mmol/L (ref 4–13)
CHLORIDE: 104 mmol/L (ref 96–111)
CO2 TOTAL: 21 mmol/L — ABNORMAL LOW (ref 22–30)
POTASSIUM: 4 mmol/L (ref 3.5–5.1)
SODIUM: 138 mmol/L (ref 136–145)

## 2021-02-05 LAB — HEPATITIS B SURFACE ANTIBODY: HBV SURFACE ANTIBODY QUANTITATIVE: 497 m[IU]/mL — ABNORMAL HIGH (ref <8)

## 2021-02-05 LAB — HIV1/HIV2 SCREEN, COMBINED ANTIGEN AND ANTIBODY: HIV SCREEN, COMBINED ANTIGEN & ANTIBODY: NEGATIVE

## 2021-02-05 LAB — AMMONIA: AMMONIA: 55 umol/L — ABNORMAL HIGH (ref 15–50)

## 2021-02-05 LAB — MAGNESIUM
MAGNESIUM: 2.4 mg/dL (ref 1.8–2.6)
MAGNESIUM: 2.5 mg/dL (ref 1.8–2.6)

## 2021-02-05 LAB — ALT (SGPT): ALT (SGPT): 10 U/L (ref 8–22)

## 2021-02-05 LAB — POC BLOOD GLUCOSE (RESULTS)
GLUCOSE, POC: 122 mg/dl — ABNORMAL HIGH (ref 70–105)
GLUCOSE, POC: 76 mg/dl (ref 70–105)
GLUCOSE, POC: 84 mg/dl (ref 70–105)
GLUCOSE, POC: 91 mg/dl (ref 70–105)
GLUCOSE, POC: 93 mg/dl (ref 70–105)

## 2021-02-05 LAB — PT/INR
INR: 1.38 — ABNORMAL HIGH (ref 0.80–1.20)
PROTHROMBIN TIME: 16 seconds — ABNORMAL HIGH (ref 9.1–13.9)

## 2021-02-05 LAB — PHOSPHORUS
PHOSPHORUS: 7.7 mg/dL — ABNORMAL HIGH (ref 2.4–4.7)
PHOSPHORUS: 7.4 mg/dL — ABNORMAL HIGH (ref 2.4–4.7)

## 2021-02-05 LAB — HGA1C (HEMOGLOBIN A1C WITH EST AVG GLUCOSE)
ESTIMATED AVERAGE GLUCOSE: 120 mg/dL
HEMOGLOBIN A1C: 5.8 % — ABNORMAL HIGH (ref 4.0–5.6)

## 2021-02-05 LAB — BUN: BUN: 54 mg/dL — ABNORMAL HIGH (ref 8–25)

## 2021-02-05 LAB — AST (SGOT): AST (SGOT): 19 U/L (ref 8–45)

## 2021-02-05 LAB — CREATININE
CREATININE: 6.21 mg/dL — ABNORMAL HIGH (ref 0.60–1.05)
ESTIMATED GFR: 8 mL/min/BSA — ABNORMAL LOW (ref >=60)

## 2021-02-05 LAB — HEPATITIS C ANTIBODY SCREEN WITH REFLEX TO HCV PCR: HCV ANTIBODY QUALITATIVE: REACTIVE — AB

## 2021-02-05 LAB — VENOUS BLOOD GAS, CO-OX, LYTES, LACTATE REFLEX - INACTIVE: MET-HEMOGLOBIN: 0.6 % (ref 0.0–2.0)

## 2021-02-05 LAB — THYROID STIMULATING HORMONE (SENSITIVE TSH): TSH: 2.365 u[IU]/mL (ref 0.430–3.550)

## 2021-02-05 LAB — ABO & RH: ABO/RH(D): O POS

## 2021-02-05 MED ORDER — NYSTATIN 100,000 UNIT/GRAM TOPICAL CREAM
TOPICAL_CREAM | Freq: Two times a day (BID) | CUTANEOUS | Status: DC
Start: 2021-02-05 — End: 2021-02-07
  Filled 2021-02-05 (×2): qty 15

## 2021-02-05 MED ORDER — INSULIN LISPRO 100 UNIT/ML SUB-Q SSIP
0.0000 [IU] | INJECTION | SUBCUTANEOUS | Status: DC | PRN
Start: 2021-02-05 — End: 2021-02-10
  Filled 2021-02-05: qty 300

## 2021-02-05 MED ORDER — FLUCONAZOLE 50 MG TABLET
150.0000 mg | ORAL_TABLET | Freq: Once | ORAL | Status: AC
Start: 2021-02-05 — End: 2021-02-05
  Administered 2021-02-05: 150 mg via GASTROSTOMY
  Filled 2021-02-05: qty 1

## 2021-02-05 MED ORDER — FENTANYL (PF) 50 MCG/ML INJECTION SOLUTION
25.0000 ug | Freq: Once | INTRAMUSCULAR | Status: AC
Start: 2021-02-05 — End: 2021-02-05
  Administered 2021-02-05: 25 ug via INTRAVENOUS
  Filled 2021-02-05: qty 2

## 2021-02-05 MED ORDER — VANCOMYCIN IV - INTERMITTENT DOSING
Freq: Every day | Status: DC | PRN
Start: 2021-02-05 — End: 2021-02-06

## 2021-02-05 MED ORDER — LEVALBUTEROL 0.63 MG/3 ML SOLUTION FOR NEBULIZATION
0.6300 mg | INHALATION_SOLUTION | Freq: Three times a day (TID) | RESPIRATORY_TRACT | Status: DC
Start: 2021-02-05 — End: 2021-02-09
  Administered 2021-02-05 – 2021-02-08 (×12): 0.63 mg via RESPIRATORY_TRACT
  Administered 2021-02-09: 0 mg via RESPIRATORY_TRACT
  Administered 2021-02-09: 0.63 mg via RESPIRATORY_TRACT
  Filled 2021-02-05 (×16): qty 1

## 2021-02-05 MED ORDER — DEXTROSE 10 % IN WATER (D10W) BOLUS
250.0000 mL | INJECTION | INTRAVENOUS | Status: AC
Start: 2021-02-05 — End: 2021-02-05
  Administered 2021-02-05: 250 mL via INTRAVENOUS
  Administered 2021-02-05: 0 mL via INTRAVENOUS

## 2021-02-05 MED ORDER — CHLORHEXIDINE GLUCONATE 0.12 % MOUTHWASH
15.0000 mL | MOUTHWASH | Freq: Two times a day (BID) | Status: DC
Start: 2021-02-05 — End: 2021-02-08
  Administered 2021-02-05 – 2021-02-08 (×7): 15 mL via ORAL
  Filled 2021-02-05 (×6): qty 15

## 2021-02-05 MED ORDER — IPRATROPIUM BROMIDE 0.02 % SOLUTION FOR INHALATION
0.5000 mg | Freq: Three times a day (TID) | RESPIRATORY_TRACT | Status: DC
Start: 2021-02-05 — End: 2021-02-07
  Administered 2021-02-05 – 2021-02-07 (×8): 0.5 mg via RESPIRATORY_TRACT
  Filled 2021-02-05 (×8): qty 1

## 2021-02-05 MED ORDER — PROPOFOL 10 MG/ML INTRAVENOUS EMULSION
10.0000 ug/kg/min | INTRAVENOUS | Status: DC
Start: 2021-02-05 — End: 2021-02-07
  Administered 2021-02-05: 30 ug/kg/min via INTRAVENOUS
  Administered 2021-02-05: 20 ug/kg/min via INTRAVENOUS
  Administered 2021-02-05 (×2): 30 ug/kg/min via INTRAVENOUS
  Administered 2021-02-05 (×2): 35 ug/kg/min via INTRAVENOUS
  Administered 2021-02-05 (×3): 0 ug/kg/min via INTRAVENOUS
  Administered 2021-02-06: 20 ug/kg/min via INTRAVENOUS
  Administered 2021-02-06: 0 ug/kg/min via INTRAVENOUS
  Administered 2021-02-06: 25 ug/kg/min via INTRAVENOUS
  Administered 2021-02-06: 35 ug/kg/min via INTRAVENOUS
  Administered 2021-02-06: 10 ug/kg/min via INTRAVENOUS
  Administered 2021-02-06: 20 ug/kg/min via INTRAVENOUS
  Administered 2021-02-06: 10 ug/kg/min via INTRAVENOUS
  Administered 2021-02-06: 15 ug/kg/min via INTRAVENOUS
  Administered 2021-02-06: 10 ug/kg/min via INTRAVENOUS
  Filled 2021-02-05 (×2): qty 100

## 2021-02-05 MED ORDER — KETAMINE 10 MG/ML INJECTION WRAPPER
INTRAMUSCULAR | Status: AC
Start: 2021-02-05 — End: 2021-02-05
  Administered 2021-02-05: 30 mg via INTRAVENOUS
  Filled 2021-02-05: qty 5

## 2021-02-05 MED ORDER — SODIUM CHLORIDE 0.9 % (FLUSH) INJECTION SYRINGE
2.0000 mL | INJECTION | Freq: Three times a day (TID) | INTRAMUSCULAR | Status: DC
Start: 2021-02-05 — End: 2021-02-13
  Administered 2021-02-05 – 2021-02-07 (×9): 0 mL
  Administered 2021-02-07: 6 mL
  Administered 2021-02-08: 10 mL
  Administered 2021-02-08 (×3): 0 mL
  Administered 2021-02-09: 6 mL
  Administered 2021-02-09 (×2): 0 mL
  Administered 2021-02-10: 6 mL
  Administered 2021-02-10: 0 mL
  Administered 2021-02-10: 2 mL
  Administered 2021-02-11 – 2021-02-13 (×7): 0 mL

## 2021-02-05 MED ORDER — SENNA LEAF EXTRACT 176 MG/5 ML ORAL SYRUP
10.0000 mL | ORAL_SOLUTION | Freq: Two times a day (BID) | ORAL | Status: DC
Start: 2021-02-05 — End: 2021-02-07
  Administered 2021-02-05 – 2021-02-07 (×5): 352 mg via GASTROSTOMY
  Filled 2021-02-05 (×5): qty 15

## 2021-02-05 MED ORDER — DEXMEDETOMIDINE 100 MCG/ML INTRAVENOUS SOLUTION
0.2000 ug/kg/h | INTRAVENOUS | Status: DC
Start: 2021-02-05 — End: 2021-02-05
  Administered 2021-02-05: 0.4 ug/kg/h via INTRAVENOUS
  Administered 2021-02-05 (×2): 0 ug/kg/h via INTRAVENOUS
  Filled 2021-02-05: qty 4

## 2021-02-05 MED ORDER — FENTANYL (PF) 50 MCG/ML INJECTION SOLUTION
INTRAMUSCULAR | Status: AC
Start: 2021-02-05 — End: 2021-02-05
  Administered 2021-02-05: 50 ug via INTRAVENOUS
  Filled 2021-02-05: qty 2

## 2021-02-05 MED ORDER — DEXMEDETOMIDINE 100 MCG/ML INTRAVENOUS SOLUTION
0.2000 ug/kg/h | INTRAVENOUS | Status: DC
Start: 2021-02-05 — End: 2021-02-08
  Administered 2021-02-05: 0 ug/kg/h via INTRAVENOUS
  Administered 2021-02-05: 0.6 ug/kg/h via INTRAVENOUS
  Administered 2021-02-05: 0.2 ug/kg/h via INTRAVENOUS
  Administered 2021-02-05: 0.8 ug/kg/h via INTRAVENOUS
  Administered 2021-02-05: 0.4 ug/kg/h via INTRAVENOUS
  Administered 2021-02-05: 1.5 ug/kg/h via INTRAVENOUS
  Administered 2021-02-05: 1 ug/kg/h via INTRAVENOUS
  Administered 2021-02-06: 0 ug/kg/h via INTRAVENOUS
  Administered 2021-02-06: 0.2 ug/kg/h via INTRAVENOUS
  Administered 2021-02-06: 0.3 ug/kg/h via INTRAVENOUS
  Administered 2021-02-06: 0.6 ug/kg/h via INTRAVENOUS
  Administered 2021-02-06: 0.3 ug/kg/h via INTRAVENOUS
  Administered 2021-02-06: 0.2 ug/kg/h via INTRAVENOUS
  Administered 2021-02-06: 0.6 ug/kg/h via INTRAVENOUS
  Administered 2021-02-06: 0.2 ug/kg/h via INTRAVENOUS
  Administered 2021-02-06: 1 ug/kg/h via INTRAVENOUS
  Administered 2021-02-06: 0.5 ug/kg/h via INTRAVENOUS
  Administered 2021-02-06: 0.6 ug/kg/h via INTRAVENOUS
  Administered 2021-02-06: 0 ug/kg/h via INTRAVENOUS
  Administered 2021-02-07: 0.8 ug/kg/h via INTRAVENOUS
  Administered 2021-02-07: 1.5 ug/kg/h via INTRAVENOUS
  Administered 2021-02-07: 0.8 ug/kg/h via INTRAVENOUS
  Administered 2021-02-07: 1 ug/kg/h via INTRAVENOUS
  Administered 2021-02-07: 1.5 ug/kg/h via INTRAVENOUS
  Administered 2021-02-07: 1.2 ug/kg/h via INTRAVENOUS
  Administered 2021-02-07 (×4): 1 ug/kg/h via INTRAVENOUS
  Administered 2021-02-07: 1.5 ug/kg/h via INTRAVENOUS
  Administered 2021-02-07: 1.4 ug/kg/h via INTRAVENOUS
  Administered 2021-02-07: 1 ug/kg/h via INTRAVENOUS
  Administered 2021-02-07: 0.8 ug/kg/h via INTRAVENOUS
  Administered 2021-02-08: 0.2 ug/kg/h via INTRAVENOUS
  Administered 2021-02-08: 0 ug/kg/h via INTRAVENOUS
  Administered 2021-02-08 (×2): 1.5 ug/kg/h via INTRAVENOUS
  Filled 2021-02-05 (×9): qty 4

## 2021-02-05 MED ORDER — FAMOTIDINE (PF) 20 MG/2 ML INTRAVENOUS SOLUTION
10.0000 mg | Freq: Every day | INTRAVENOUS | Status: DC
Start: 2021-02-05 — End: 2021-02-05

## 2021-02-05 MED ORDER — SODIUM CHLORIDE 0.9 % INJECTION SOLUTION
2.0000 mL | INTRAVENOUS | Status: AC
Start: 2021-02-05 — End: 2021-02-05
  Administered 2021-02-05: 2 mL via INTRAVENOUS

## 2021-02-05 MED ORDER — FENTANYL (PF) 50 MCG/ML INJECTION SOLUTION
50.0000 ug | INTRAMUSCULAR | Status: AC
Start: 2021-02-05 — End: 2021-02-05

## 2021-02-05 MED ORDER — PROPOFOL 10 MG/ML INTRAVENOUS EMULSION
INTRAVENOUS | Status: AC
Start: 2021-02-05 — End: 2021-02-06
  Administered 2021-02-05: 35 ug/kg/min via INTRAVENOUS
  Filled 2021-02-05: qty 100

## 2021-02-05 MED ORDER — SODIUM CHLORIDE 0.9 % (FLUSH) INJECTION SYRINGE
2.0000 mL | INJECTION | INTRAMUSCULAR | Status: DC | PRN
Start: 2021-02-05 — End: 2021-02-13

## 2021-02-05 MED ORDER — FAMOTIDINE 20 MG TABLET
10.0000 mg | ORAL_TABLET | Freq: Every day | ORAL | Status: DC
Start: 2021-02-05 — End: 2021-02-06
  Administered 2021-02-05 – 2021-02-06 (×2): 10 mg via ORAL
  Filled 2021-02-05 (×2): qty 1

## 2021-02-05 MED ORDER — VANCOMYCIN IV - PHARMACIST TO DOSE PER PROTOCOL - NO EXCLUSION CRITERIA
Freq: Every day | Status: DC | PRN
Start: 2021-02-05 — End: 2021-02-17

## 2021-02-05 MED ORDER — DOCUSATE SODIUM 50 MG/5 ML ORAL LIQUID
100.0000 mg | Freq: Two times a day (BID) | ORAL | Status: DC
Start: 2021-02-05 — End: 2021-02-08
  Administered 2021-02-05 – 2021-02-08 (×7): 100 mg via GASTROSTOMY
  Filled 2021-02-05 (×8): qty 10

## 2021-02-05 MED ORDER — BUDESONIDE 0.5 MG/2 ML SUSPENSION FOR NEBULIZATION
1.0000 mg | INHALATION_SUSPENSION | Freq: Two times a day (BID) | RESPIRATORY_TRACT | Status: DC
Start: 2021-02-05 — End: 2021-02-11
  Administered 2021-02-05 – 2021-02-09 (×10): 1 mg via RESPIRATORY_TRACT
  Administered 2021-02-10 – 2021-02-11 (×3): 0 mg via RESPIRATORY_TRACT
  Filled 2021-02-05 (×13): qty 4

## 2021-02-05 MED ORDER — KETAMINE 10 MG/ML INJECTION WRAPPER
30.0000 mg | Freq: Once | INTRAMUSCULAR | Status: AC
Start: 2021-02-05 — End: 2021-02-05

## 2021-02-05 MED ORDER — HEPARIN (PORCINE) 5,000 UNIT/ML INJECTION SOLUTION
5000.0000 [IU] | Freq: Three times a day (TID) | INTRAMUSCULAR | Status: DC
Start: 2021-02-05 — End: 2021-02-13
  Administered 2021-02-05 – 2021-02-12 (×23): 5000 [IU] via SUBCUTANEOUS
  Administered 2021-02-13: 0 [IU] via SUBCUTANEOUS
  Filled 2021-02-05 (×22): qty 1

## 2021-02-05 NOTE — Progress Notes (Signed)
[facility] Critical Care    Due to critical illness requiring sedation and intubation support, the patient does not have decision making capacity at this time. Will need care management to facilitate a heath care surrogate for decision making.     Kathaleen Bury, APRN,AGACNP-BC  02/05/2021, 04:51

## 2021-02-05 NOTE — Care Management Notes (Signed)
[facility] Management Initial Evaluation    Patient Name: [name] [name]  Date of Birth: Mar 27, 1986  Sex: female  Date/Time of Admission: 02/05/2021 12:34 AM  Room/Bed: 19/A  Payor: [place] MEDICAID / Plan: [place] MEDICAID / Product Type: Medicaid [facility] /   Primary Care Providers:  Pcp, No (General)    Pharmacy Info:   Preferred Pharmacy       None          Emergency Contact Info:   Extended Emergency Contact Information  Primary Emergency Contact: [place] Phone: [phone]  Relation: Mother  Secondary Emergency Contact: Trixie Dredge  Mobile Phone: [phone]  Relation: Sister    History:   [name] [name] is a 35 y.o., female, admitted with endocarditis    Height/Weight: 165.1 cm (5' 5" ) / 72.4 kg (159 lb 9.8 oz)     LOS: 0 days   Admitting Diagnosis: Endocarditis [I38]    Assessment:      02/05/21 1419   Assessment Details   Assessment Type Admission   Date of Care Management Update 02/05/21   Date of Next DCP Update 02/08/21   Readmission   Is this a readmission? No   Insurance Information/Type   Insurance type Medicaid   Employment/Financial   Patient has Prescription Coverage?  Yes        Name of Insurance Coverage for Medications [employer]   Financial Concerns none   Living Environment   Select an age group to open "lives with" row.  Adult   Lives With significant other   Living Arrangements house   Able to Return to Prior Arrangements other (see comments)   Living Arrangement Comments Pt's mother/HCS plans for pt to come to her home in [place] following d/c   Home Safety   Home Assessment: No Problems Identified   Home Accessibility no concerns   Custody and Legal Status   Do you have a court appointed guardian/conservator? No   Care Management Plan   Discharge Planning Status initial meeting   Projected Discharge Date 02/08/21   CM will evaluate for rehabilitation potential yes   Patient choice offered to patient/family no   Form for patient choice reviewed/signed and on  chart no   Patient aware of possible cost for ambulance transport?  No   Discharge Needs Assessment   Equipment Currently Used at Home none   Equipment Needed After Discharge other (see comments)  (TBD)   Discharge Facility/Level of Care Needs Undetermined at this time   Transportation Available car;family or friend will provide   Referral Information   Admission Type inpatient   Address Verified verified-no changes   Arrived From acute hospital, other   [place]   [facility]   ADVANCE DIRECTIVES   Does the Patient have an Advance Directive? Yes, Patient Does Have Advance Directive for Healthcare Treatment   Type of Advance Directive Completed 1   Copy of Advance Directives in Chart? 0   Name of MPOA or Healthcare Surrogate [name] [name] (mother)   Phone Number of [place] or Healthcare Surrogate [phone]   Patient Requests Assistance in Having Advance Directive Notarized. N/A       Discharge Plan:  Undetermined at this time  Pt admitted as a transfer from [facility]. Per chart review, pt remains intubated, on ventilator support, and in restraints. Cardiac Surgery evaluating for surgical intervention. 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 ID and Nephrology consulted. Pt  remains in a propofol drip. CTs and TTE completed today, and blood cultures ordered. Pt is NPO, and on continuous tube feeds.      MSW Karns met with the pt's mother/HCS, [name] [name], at bedside to complete the initial assessment. [name] [name] discussed that she has transferred the pt's address to her address ([address] [place] [address]), and pt will not be returning to the address in [place], [place] ([address] [place], [place] [address]). Pt's insurance was confirmed. [name] [name] could not provide pt's pharmacy of preference or PCP. HCS was completed at [facility] prior to pt's transfer to [facility]. Copy was obtained; copy placed in the pt's paper chart and the  [facility]. No additional needs identified at this time. D/C date and disposition TBD, pending pt's treatment plan and pt's progress. MSW Jearld Shines wrote her contact information on the pt's whiteboard, and will continue to follow.     The patient will continue to be evaluated for developing discharge needs.     Case Manager: Elease Hashimoto, [place]  Phone: [phone]

## 2021-02-05 NOTE — Consults (Signed)
INFECTIOUS DISEASE INITIAL CONSULTATION    Patient Name: [name] [name] Number: [id number]  Date of Service: 02/05/2021  Date of Birth: 1986-03-13    Hospital Day:  LOS: 0 days     Requesting Service: CARDIAC SURGERY  Requesting Physician: Lyla Son, MD    Impression:  [name] [name] is a 35 y.o., White female with a past medical history of IVDU, frequent kidney stones with concern for recurrent UTIs vs pyelo, and hep C (unsure of viral load).  Patient was hospitalized at [facility] 8/18 after being found down with suspected overdose.  She required intubation due to bradypnea and lethargy as well as question for aspiration pneumonia.  Blood cultures have been positive for MRSA from 08/18 through 8/21. Additionally, a respiratory culture was positive for MRSA.  Patient had TTE at outside facility that suggested tricuspid regurgitation and large tricuspid vegetation.  Patient had severe anuric AKI and has required HD. Patient transferred to [facility] for cardiac surgery evaluation.    Upon admission to [facility] 8/22, patient is intubated on minimal vent settings.  She has recently been on HD due to severe anuric AKI.  She has been receiving cefepime and vancomycin, which has been deescalated to vancomycin monotherapy due to suspicion that this is all monomicrobial process with MRSA.  Repeat TTE 8/22 shows 1.4 x 0.8 cm tricuspid valve vegetation with mild tricuspid valve regurgitation.  Additionally, lung imaging suggests multiple lung nodularities and possible cavitation, which is suggestive of bilateral septic pulmonary emboli.    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  Please repeat blood cultures Q 48 hours until negative.  Agree with continuing vancomycin monotherapy pharmacy to dose protocol.  Please keep in mind patient's HD requirements.    Please continue to follow CBC with differential, BUN,  creatinine, hepatic function panel, and CRP while the patient remains on antimicrobial therapy.    Thank you for this consultation.  We will continue to follow.  Please call or page the Infectious Diseases Service with any questions regarding this patient.    Information Source: Patient and electronic medical record    Reason for Consultation:  Native Tricuspid valve MRSA infective endocarditis    History of Present Illness:  [name] [name] is a 35 y.o., White female with a past medical history of IVDU, frequent kidney stones with concern for recurrent UTIs vs pyelo, and hep C (unsure of viral load).  Patient was hospitalized at [facility] 8/18 after being found down with suspected overdose. She required intubation due to bradypnea and lethargy as well as question for aspiration pneumonia.  Blood cultures have been positive for MRSA from 08/18 through 8/21.  Additionally, a respiratory culture was positive for MRSA.  Patient had TTE at outside facility that suggested tricuspid regurgitation and large tricuspid vegetation.  Patient had severe anuric AKI and has required HD. Patient transferred to [facility] for cardiac surgery evaluation.    Upon admission to [facility] 8/22, patient is intubated on minimal vent settings.  She has recently been on HD due to severe anuric AKI.  She has been receiving cefepime and vancomycin, which has been deescalated to vancomycin monotherapy due to suspicion that this is all monomicrobial process with MRSA.  Repeat TTE 8/22 shows 1.4 x 0.8 cm tricuspid valve vegetation with mild tricuspid valve regurgitation.    Prior to this hospitalization, patient's mother says the patient was having hematuria and pain  from kidney stone.  She had recently finished a 10 day course of Keflex about a week ago due to concern for pyelonephritis.  Mother was not sure if this had resolved patient's hematuria or issue.    Past Medical History:  IVDU with meth and heroin.  Repeat kidney stones  with recurrent UTIs versus pyelonephritis    Past Surgical History:  Hysterectomy.  Her mother confirms to she does not have any hardware or previous heart valve surgeries.    Family History:  Unable to obtain family medical history as patient is intubated and unable to answer questions.    Allergies:  No Known Allergies    Medications:  Medications Prior to Admission     None        Inpatient Medications:  budesonide (PULMICORT RESPULES) 0.5 mg/2 mL nebulizer suspension, 1 mg, Nebulization, 2x/day  chlorhexidine gluconate (PERIDEX) 0.12% mouthwash, 15 mL, Swish & Spit, 2x/day  docusate sodium (COLACE) '10mg'$  per mL oral liquid, 100 mg, Gastric (NG, OG, PEG, GT), 2x/day  famotidine (PEPCID) tablet, 10 mg, Oral, Daily  fluconazole (DIFLUCAN) tablet 150 mg, 150 mg, Gastric (NG, OG, PEG, GT), Once  heparin 5,000 unit/mL injection, 5,000 Units, Subcutaneous, Q8HRS  levalbuterol (XOPENEX) 0.63 mg/ 3 mL nebulizer solution, 0.63 mg, Nebulization, 3x/day   And  ipratropium (ATROVENT) 0.02% nebulizer solution, 0.5 mg, Nebulization, 3x/day  NS flush syringe, 2-6 mL, Intracatheter, Q8HRS   And  NS flush syringe, 2-6 mL, Intracatheter, Q1 MIN PRN  nystatin (MYCOSTATIN) 100,000 units/g topical cream, , Apply Topically, 2x/day  propofol (DIPRIVAN) 10 mg/mL premix infusion, 10 mcg/kg/min (Adjusted), Intravenous, Continuous  senna concentrate (SENNA) '528mg'$  per 64m oral liquid, 10 mL, Gastric (NG, OG, PEG, GT), 2x/day  SSIP insulin lispro 100 units/mL injection, 0-12 Units, Subcutaneous, Q4H PRN  Vancomycin IV - Pharmacist to Dose per Protocol, , Does not apply, Daily PRN  Vancomycin IV Intermittent Dosing, , Does not apply, Daily PRN            Current Antimicrobials:  Antibiotics (From admission, onward)    Start     Stop Route Frequency    02/05/21 0226  Vancomycin IV Intermittent Dosing         -- N/A DAILY PRN           Lines:  Patient Lines/Drains/Airways Status     Active Line / Dialysis Catheter / Dialysis Graft / Drain /  Airway / Wound     Name Placement date Placement time Site Days    Peripheral IV Left Median Cubital  (antecubital fossa) 02/04/21  2000  -- less than 1    Peripheral IV Right Basilic  (medial side of arm) 02/04/21  2000  -- less than 1    Dialysis Catheter Trialysis Catheter;With injection port/lumen 02/04/21  2000  -- less than 1    Oral Gastric Tube --  --  -- --    EndoTracheal Tube 7.5 --  --  -- --    EndoTracheal Tube Lip --  --  -- --    Wound (Non-Surgical) Anterior;Right Elbow 02/05/21  0045  -- less than 1                 [place] History:  [place]  Patient [place] IVDU.  Patient has 3 kids.  Patient has a boyfriend.    REVIEW OF SYSTEMS:  Positive per HPI.     Physical Exam:  Current Vitals:   BP 123/88   Pulse (Marland Kitchen  125   Temp 37.9 C (100.22 F)   Resp 20   Ht 1.651 m ('5\' 5"'$ )   Wt 72.4 kg (159 lb 9.8 oz)   SpO2 100%   BMI 26.56 kg/m     Vitals in last 24 hours: Temp  Avg: 37.9 C (100.22 F)  Min: 37.9 C (100.22 F)  Max: 37.9 C (100.22 F)  MAP (Non-Invasive)  Avg: 91.9 mmHG  Min: 76 mmHG  Max: 115 mmHG  Pulse  Avg: 118  Min: 100  Max: 129  Resp  Avg: 19.1  Min: 15  Max: 34  SpO2  Avg: 99.5 %  Min: 98 %  Max: 100 %  General:  Ill appearing.  Mechanically ventilated and sedated.  Eyes: Pupils equal round. No conjunctival injection or scleral icterus.  ENT: Face symmetrical.  ET tube in place.  No mouth lesions.  Neck: Supple and symmetrical.    Lungs: Clear to auscultation bilaterally to anterior lung fields on MV.   Cardiovascular:  Tachycardic.  Regular rhythm.  No murmur.  Abdomen:  Hypoactive bowel sounds.  Nondistended, soft, and nontender to palpation.   Extremities: No joint erythema or swelling.  No cyanosis or edema.  Right AC with track marks.  Right groin with dialysis line.  No erythema, drainage, or induration around site.  Skin: Warm and dry.  No rash or lesions.  Neurologic:  Sedated.  Psychiatric:  Unable to assess.     Laboratory Studies:    CBC  Differential   Recent Labs     02/05/21  0055   WBC 11.3*   HGB 8.5*   HCT 25.7*   PLTCNT 88*   BANDS 2    Recent Labs     02/05/21  0055   PMNS 92   LYMPHOCYTES 2   MONOCYTES 4   EOSINOPHIL 0   BASOPHILS 0  <0.10   PMNABS 10.62*   MONOSABS 0.45   EOSABS <0.10      BMP LFTs   Recent Labs     02/05/21  0055   SODIUM 138  134*   POTASSIUM 4.0   CHLORIDE 104  102   CO2 21*   BUN 54*   CREATININE 6.21*   ANIONGAP 13   GFR 8*   MAGNESIUM 2.4   PHOSPHORUS 7.7*    Recent Labs     02/05/21  0055 02/05/21  0056   UROBILINOGEN  --  Negative   AST 19  --    ALT 10  --       CoAgs Blood Gas:   Recent Labs     02/05/21  0055   PROTHROMTME 16.0*   INR 1.38*    Recent Labs     02/05/21  0055   FI02 50.0   PH 7.39   PCO2 35*   PO2 50   BICARBONATE 22.0   BASEDEFICIT 3.2*       Cardiac Markers Lipid Panel   No results for input(s): TROPONINI, CKMB, MBINDEX, BNP in the last 72 hours. Recent Labs     02/05/21  0055   CHOLESTEROL 96*   HDLCHOL <5*   TRIG 214*   VLDLCAL 43*      Urine Analysis Other Labs   Recent Labs     02/05/21  0056   APPEARANCE Turbid*   COLOR Orange*   SPECGRAVUR 1.034*   GLUCOSE Negative   BILIRUBIN Small*   KETONES 10 *   PHURINE 5.5   PROTEIN >= 600*  UROBILINOGEN Negative   NITRITE Negative   LEUKOCYTES Large*   RBCS >182.0*   WBCS >182.0*   BACTERIA Many*    Recent Labs     02/05/21  0055   TSH 2.365          Microbiology:  No results found for any visits on 02/05/21 (from the past 96 hour(s)).    No results found for any visits on 02/05/21 (from the past 24 hour(s)).      Imaging Studies:  Results for orders placed or performed during the hospital encounter of 02/05/21 (from the past 24 hour(s))   XR AP MOBILE CHEST     Status: None    Narrative    XR AP MOBILE CHEST performed on 02/05/2021 1:17 AM    INDICATION: 35 years old Female; Mechanical vent    TECHNIQUE: 1 views of the chest; 1 images    COMPARISON: None available    SUPPORT DEVICES: Endotracheal tube tip projects about 2 cm from the carina.  Enteric tube tip projects below the diaphragm.    FINDINGS:  The heart is normal in size. There is no significant pleural effusion or evidence of pneumothorax. Few tiny nodular opacities with possible cavitation for example at left lung base, concerning for infectious/inflammatory process.      Impression    1.Few nodular opacities with possible cavitation, concerning for infectious/inflammatory process.  2.Endotracheal tube tip projects about 2 cm from the carina.       Reatha Harps, MD  02/05/2021, 18:00      02/05/2021    I saw and examined the patient.  I reviewed the fellow's note.  I agree with the findings and plan of care as documented in the fellow's note.  Any exceptions/additions are edited/noted.     On the day of the encounter, a total of 45 minutes was spent in direct/indirect care of this patient including evaluation, review and interpretation of laboratory and microbiology studies, review of radiology studies and the medical record, order entry and coordination of care, counseling and educating the patient/family/caregiver, and documentation.    Marlene Lard, M.D.  Assistant Professor  Section of Infectious Diseases  [employer]  02/05/2021, 18:04

## 2021-02-05 NOTE — Ancillary Notes (Signed)
Chart reviewed.  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 Will continue to monitor patient and provide services once plan of care has been determined, most likely after surgical intervention.    Kylon Philbrook A. Elvis Coil, [place], EP-C  Cardiac Rehabilitation  [phone]

## 2021-02-05 NOTE — Nurses Notes (Signed)
02/05/21 2000   Restraint Type(Q2 Hours)   Restraint Used M   Soft Restraint Right Wrist CONTINUED   Soft Restraint Left Wrist CONTINUED   Pt Assessment (Q2 Hours)   Reason For Restraint Limit ability/pt attempting to remove artificial airway;Intermittent break through RASS>-2;Limit ability/pt attempting to pull IV lines;Limit ability/pt attempting to pull tubes   Pt's current behavior/status Agitation increased;Restless   Emotional Support Provided   Restraint Monitoring (Q2 Hours)   Assessed For Continued Need Yes   Visual Check Agitated/Restless   Circulation No signs of injury   Restraint Removed for Assessment Yes   Range of Motion Performed   Fluids NPO   Food/Meal/Bottle NPO   Elimination Anuric   Education Group (Q2 Hours)   Discontinuation Criteria Able to follow directions;Decreased agitation;Oriented to self and environment;Discontinuation of medical treatment;Resolution of contributing medical condition   Criteria Explained Yes   Patient/Co-Learner's Response NL   Restraint Continuation      Patient continues to have the following condition AMS/ETT.       Least restrictive alternatives attempted : assessed meds/medical problems    Patient continues to exhibit the following behaviors: uncooperative and agitated    The restraint continued to facilitate medical/surgical treatment to ensure safety.    The patient will continue to be evaluated and assessments documented on the flowsheet to ensure that the patient is released from the restraint at the earliest possible time

## 2021-02-05 NOTE — Pharmacy (Signed)
[place] / [facility] of Pharm[facility]  Therapeutic Drug Monitoring: Vancomycin  02/05/2021      Patient name: [name] [name], [name] [name]  Date of Birth:  31-May-1986    Actual Weight:  Weight: 72.4 kg (159 lb 9.8 oz) (02/05/21 0100)     BMI:  BMI (Calculated): 26.62 (02/05/21 0100)      Date RPh Current regimen (including mg/kg) Indication &  Organism AUC or trough based dosing Target Levels^ SCr (mg/dL) CrCl* (mL/min) Infectious Laboratory Markers (as applicable)   Measured level(s)   (mcg/mL) Calculated AUC (if AUC based monitoring) Plan & predicted AUC/trough if initial dosing (including when levels are due) Comments   8/22 [facility] intermittent dosing at outside hospital with dialysis endocarditis trough 13-17 6.21 12 WBC:11.3  Procal:  CRP: Vanc random 24.4  No Vanc at this time as the level is 24.4 - will proceed with intermittent dosing- dialysis plan unclear at this time - will clarify in the morning to determine further dosing/levels                                                                                                    ^Target levels depends on dosing and monitoring method, AUC vs. trough based. For AUC based dosing units are mg*h/L. For trough based dosing units are mcg/mL.     *Creatinine clearance is estimated by using the Cockcroft-Gault equation for adult patients and the Carol Ada for pediatric patients.    The decision to discontinue vancomycin therapy will be determined by the primary service.  Please contact the pharmacist with any questions regarding this patient's medication regimen.

## 2021-02-05 NOTE — Nurses Notes (Signed)
Pt arrived to CVICU bed 19 via EMS from [place] s/t endocarditis. Pt hooked to monitor, on precedex gtt. APP Moccia at bedside. Awaiting further orders. Will continue to monitor.

## 2021-02-05 NOTE — Nurses Notes (Addendum)
02/05/21 0200   Restraints (Document with every START action)   Failed Less Restrictive Alternative Attempted  To Deescalate Behavior Assessed Meds/Medical Problems   Reason Alternatives Failed Unable To Follow Directions Basic To Patient Safety   Assessed Within 12 Hrs by LIP Yes   Family Notified of Application of restraint Yes   Notified: HCS   Name of person notified Otila Kluver   Date Notified 02/05/21   Time Notified 0200   Response to notification acknowledged information   Restraint Order (Document with every START action and daily when new order obtained)   Order Obtained Yes   Length of Order 24   Restraint Type(Q2 Hours)   Restraint Used M   Soft Restraint Right Wrist START   Soft Restraint Left Wrist START   Pt Assessment (Q2 Hours)   Reason For Restraint Limit ability/pt attempting to remove artificial airway   Pt's current behavior/status Agitated;Restless   Emotional Support Provided   Restraint Monitoring (Q2 Hours)   Assessed For Continued Need Yes   Visual Check Agitated/Restless   Circulation No signs of injury   Restraint Removed for Assessment Yes   Range of Motion Performed   Fluids NPO   Food/Meal/Bottle NPO   Elimination Urinary catheter   Education Group (Q2 Hours)   Discontinuation Criteria Able to follow directions;Decreased agitation;Discontinuation of medical treatment   Criteria Explained Yes   Patient/Co-Learner's Response NL   Restraint Plan of Care   Restraint Plan of Care Initiated   Restraint Initiation    Patient assessed and found to have the following condition:agitated/confused/ETT          Least restrictive alternatives attempted: assessed meds/medical problems.    Patient is exhibiting the following behaviors: uncooperative and agitated.    The reason for application of restraint: patient attempting to remove artificial airway, patient attempting to pull at IV lines, patient attempting to pull at invasive medical tubes and intermittent break through RASS> -2    The restraint was  applied to facilitate medical/surgical treatment to ensure safety.    The patient will continue to be evaluated and assessments documented on the flowsheet to ensure that the patient is released from the restraint at the earliest possible time.

## 2021-02-05 NOTE — Care Plan (Signed)
Respiratory Care Services  ICU Care Plan    Patient Name:  [name] [name], Weight  Date of Service:  02/05/2021  Hospital Day:     LOS: 0 days     Ventilator Settings:  Mode: P-CMV  Set Rate: 15 Breaths Per Minute  Set PEEP: 5 cmH2O  PC Set: 10 cmH2O  FiO2: 30 %  I:E Ratio: 1:1.1    Blood Gas Results:  Recent Labs     02/05/21  0055   FI02 50.0   PH 7.39   PCO2 35*   PO2 50   BICARBONATE 22.0   BASEDEFICIT 3.2*       Summary/Plan of Care:  Patient remains intubated throughout shift. FiO2 and PC weaned. Xopenex and Atrovent ordered TID, Pulmicort ordered BID. Suctioning for thick, rusty secretions. BAL sent to lab. RT will continue to monitor.       Katherina Mires, RT  02/05/2021

## 2021-02-05 NOTE — Nurses Notes (Signed)
Restraint Continuation      Patient continues to have the following condition: agitation and confusion.       Least restrictive alternatives attempted : bed-exit alarm/motion detector, concealed tubes/lines, decreased/removed stimulus, reoriented to person, place, time, and treatment and assessed meds/medical problems    Patient continues to exhibit the following behaviors: agitated and confused    The restraint continued to facilitate medical/surgical treatment to ensure safety.    The patient will continue to be evaluated and assessments documented on the flowsheet to ensure that the patient is released from the restraint at the earliest possible time    Dierdre Searles  02/05/2021, 15:49

## 2021-02-05 NOTE — H&P (Signed)
[facility]  HVI Critical Care Consult/Progress Note    Sameen, Stair, 35 y.o. female  Date of Birth: [birth year]  Medical Record Number:  [id number]   Inpatient Admission Date: 02/05/2021   Hospital Day:  LOS: 0 days     Chief Complaint: Altered Mental Status   History of Present Illness: Pt is a 35 yo female with a significant PMH for Smoking and IVDU presented to [facility] with AMS on 01/31/21. She was found to have a drug overdose, aspiration PNA requiring intubation, MRSA bacteremia, large TV endocarditis with moderate TR and elevated systemic pulmonary pressures (29mhg), AKI requiring HD, anemia requiring PRBC and thrombocytopenia. Urine drug screen + for heroin and methamphetamines.    [facility]    [facility]: [facility]  8/22: See above.   -Prop for sedation. Agitated; MAE; does not follow. Repeat CT Head/CAP. Hemodynamics stable. Intubated. Repeated blood cultures. Continue Vanc.    Past Medical History: No past medical history on file.    Past Surgical History: No past surgical history on file.   Social History:  Social History     Socioeconomic History   . Marital status: Divorced     Spouse name: Not on file   . Number of children: Not on file   . Years of education: Not on file   . Highest education level: Not on file   Occupational History   . Not on file   Tobacco Use   . Smoking status: Not on file   . Smokeless tobacco: Not on file   Substance and Sexual Activity   . Alcohol use: Not on file   . Drug use: Not on file   . Sexual activity: Not on file   Other Topics Concern   . Not on file   Social History Narrative   . Not on file     Social Determinants of Health     Financial Resource Strain: Not on file   Food Insecurity: Not on file   Transportation Needs: Not on file   Physical Activity: Not on file   Stress: Not on file    Intimate Partner Violence: Not on file   Housing Stability: Not on file      Family History: Family Medical History:    None          Allergies: No Known Allergies   ROS:  Unobtainable because intubated/sedated      Problem List:   Patient Active Problem List   Diagnosis   . Endocarditis       Medications:  Scheduled Meds: chlorhexidine gluconate, 15 mL, 2x/day  docusate sodium, 100 mg, 2x/day  famotidine, 10 mg, Daily  NS flush, 2-6 mL, Q8HRS  perflutrn lipid microspheres (DEFINITY) DILUTION injection, 2 mL, Give in Cardiology  propofoL, ,   senna concentrate, 10 mL, 2x/day      IV Infusions: propofoL, Last Rate: 20 mcg/kg/min (02/05/21 0220)      PRN:  NS flush, 2-6 mL, Q1 MIN PRN  insulin lispro, 0-12 Units, Q4H PRN  Pharmacy to Dose Vancomycin, , Daily PRN  Vancomycin IV Intermittent Dosing, , Daily PRN        Last VS:    Heart Rate: (!) 126  Respiratory Rate: (!) 23  BP (Non-Invasive): 130/84      Physical Exam  Vitals reviewed.   Constitutional:  Appearance: She is ill-appearing.   HENT:      Mouth/Throat:      Mouth: Mucous membranes are dry.   Eyes:      Pupils: Pupils are equal, round, and reactive to light.   Cardiovascular:      Rate and Rhythm: Regular rhythm. Tachycardia present.      Pulses: Normal pulses.      Comments: Sinus tach 100-120    Pulmonary:      Breath sounds: Rhonchi present.      Comments: Intubated    Abdominal:      Palpations: Abdomen is soft.      Comments: Non-tender, non distended. Hypoactive bowel sounds.    Genitourinary:     Comments: Foley; yellow urine   Musculoskeletal:      Right lower leg: Edema present.      Left lower leg: Edema present.   Skin:     General: Skin is warm and dry.   Neurological:      General: No focal deficit present.      Comments: MAE. Agitation.   Protectives intact          '[x]'$ I have reviewed the patient's vitals signs, the nursing notes, the physician's notes and other progress notes     '[x]'$ I have reviewed the laboratory and imaging  data    '[x]'$ I have reviewed the records from [place]     '[x]'$ I have discussed this case with CCM attending       Systems Based Assessment and Plan:    Neurologic:  Data/Assessment Reviewed    Diagnosis Altered mental status 2/2 encephalopathy   Overdose 2/2 IVDU   Course: Critical   Plan PRN pain medication, will monitor   Day/night policy, reorientation as needed  PT/OT when appropriate   Serial neuro checks    8/17: outside hospital head CT negative for abnormalities   Repeat head CT 2/2 continued altered mental status     Continue prop for agitation      Cardiovascular:  Data/Assessment Most Recent Hemodynamics: Reviewed   Cardiovascular support: NA     Diagnosis TV Endocarditis    Moderate TR  Mild Pulmonary HTN   Course Critical   Plan Endocarditis:   -Repeat TTE  -Continue Vanc  -Repeat Blood cultures   -Will consult SCT for surgical evaluation     Serial hemodynamics  MAP >65     Respiratory:  Data/Assessment: CXR: Reviewed   Most recent VBG: Reviewed   Vent Settings: Reviewed      Diagnosis Aspiration PNA   Mechanical Ventilation   Smoker     Course Critical   Plan Wean vent as tolerated  Continue vent support for airway protection   Pulm toilet; scheduled nebs  CT chest this morning          Renal:  Data/Assessment: Reviewed    Diagnosis Acute kidney injury   Course Stable   Plan Replacing electrolytes per order  Strict I&O  Foley intact  Will monitor BMP    HD 8/19 and 8/20     Gastrointestinal:  Data/Assessment: Famotidine (Pepcid)  for stress ulcer prophylaxis   Diet:  NPO   Bowel Regimen:  Colace and Senna    Diagnosis Not applicable   Course  Stable   Plan Tube feeds if not surgical plans  OG to LIS  Continue bowel regimen  Continue stress ulcer prophylaxis     CT abdomen/pelvis      Endocrine:  Data/Assessment Last three Fingerstick  glucose: Reviewed   Last HbA1C: NA  Most recent Thyroid results: 2.3     Diagnosis No active diagnosis   Course Stable   Plan Regular SSI every 6 hours  Goal 140-180        Hematologic:  Data/Assessment:  Reviewed    Diagnosis Anemia of chronic disease and Thrombocytopenia   Course Stable   Plan SCDs (Sequential Compression Device)  For DVT Prophylaxis  Sub q heparin  Continue to closely monitor cbc/coags      Infectious Diseases:  Data/Assessment Tmax last 24 hrs: No data recorded.       Diagnosis endocarditis   Course Critical   Plan Antibiotic therapy  Agent:  Vancomycin  Indication: MRSA     Will repeat cultures; UA    Will continue to monitor for additional s/s of infection        Family Communication and Disposition:  Data/Assessment Will update patient and family when appropriate        I independently of the faculty provider spent a total of 50 minutes in direct/indirect care of this patient including initial evaluation, review of laboratory, radiology, diagnostic studies, review of medical record, order entry and coordination of care.    Kathaleen Bury, APRN,AGACNP-BC, 02/05/2021    Critical Care Attestation  I was present at the bedside of this critically ill patient.  I saw and examined the patient and discussed the patient with the CVICU team.  I agree with the current note and plan.  This patient suffers from encephalopathy, endocarditis, respiratory failure, dysphagia, leukocytosis, anemia related critical illness, acute kidney injury, history of IV drug abuse.  The care of this patient was in regard to managing condition(s) that have a high probability of sudden, clinically significant or life-threatening deterioration and require a high degree of attending physician attention.  The data reviewed and care planning were performed in direct proximity of the patient.  All critical care time was spent exclusive of procedures which will be documented elsewhere in the chart.  My critical care time is independent and unique to other providers.  Medications, allergies, vital signs, lab tests, imaging, nursing notes and physician notes have been reviewed.  Total Critical Care  Time: 52 minutes    Joaquin Courts, MD

## 2021-02-05 NOTE — Consults (Signed)
 [facility]  Cardiac Surgery  Admission       Date of Service:  02/05/2021  [name] [name], [name] [name]  Encounter Start Date:  02/05/2021  Inpatient Admission Date:  02/05/2021  Admission Source: Outside Facility: What Facility: [facility]    Chief Complaint: Altered mental status; +MRSA Bacteremia; TV Endocarditis    Primary Service: Cardiac Surgery  Primary Care Provider: None on file  Primary Cardiologist: None on file    HPI: "Pt is a 35 yo female with a significant PMH for Smoking and IVDU presented to [name] with AMS on 01/31/21. 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 Urine drug screen + for heroin and methamphetamines. [facility] of her TV endocarditis."    ROS: Review of systems was not obtained due to intubation and sedation .    Information Obtained from history reviewed via medical record    No past medical history on file.    Risk Factors:    Family history of premature CAD (female < 526or female < 682: No  Diabetes Mellitus  (HbA1C >6.5, Fasting >126 or Random glucose >200 with hyperglycemic symptoms):  No    Dyslipidemia:  (total chol >200, LDL? 130, HDL men <40, HDL women <50): No    Renal Disease: Yes. If yes  Yes,  Acute Kidney Failure with Tubular Necrosis (ATN)   Hypertension (140/90 or 130/80 with DM or CKD): No  Chronic Lung Disease: No.   Home oxygen:No  Sleep Apnea: No   Pneumonia: Recent (within 1 month of surgery)  History of or current Depression: No  Liver disease: No  Immunocompromised at present (systemic steroids, chemo, anti-rejection meds): No  Mediastinal radiation: No  Cancer within 5 years (doesn't include basal cell or squamous cell CA): No  Peripheral artery disease  (claudication, amputation, vascular reconstruction, aortic aneurysm. THIS DOES NOT include the carotid or cerebral vascular arteries or thoracic aneurysms): No    Thoracic Aorta Disease (history or current disease of the thoracic or thorcoabdominal aorta): No    Syncope (cardiac related within 1 year or surgery): No  Prior Cerebrovascular disease: No  History of previous carotid artery surgery and/or stenting: No  CSHA Clinical Frailty Scale: 3- Managing Well: Medical problems well controlled, but not regularly active beyond routine walking.  Five Meter Walk/ Six Minute Walk Performed: No.  Functional Disability: None  Electrolyte Imbalance: No  Protein-Calorie Malnutrition: No  Coagulopathy: No  Current Sepsis: Yes. If yes:  Sepsis without shock  Contraindication for Perioperative Beta-Blocker: No  Fatigue/Debility: Acute Weakness.  weakness    Cardiac Status:    Prior MI: No  CAD presentation: N/a  Heart Failure: No      Cardiogenic Shock: No  Previous Arrhythmia : No  Prior Arrythmia surgery (MAZE or ablation): No  Prior CABG: No   Prior Valve Surgery: No  Prior PCI: No  Previous congenital: No  Previous ICD: No  Previous Pacemaker: No  Other previous cardiovascular intervention: No  Cardiomyopathy: No  Porcelain Aorta: No    Transcatheter Procedure: No    No Known Allergies    Prior to Admission Medications:  Medications Prior to Admission     None           Current Inpatient Medications:  chlorhexidine gluconate (  PERIDEX) 0.12% mouthwash, 15 mL, Swish & Spit, 2x/day  docusate sodium (COLACE) '10mg'$  per mL oral liquid, 100 mg, Gastric (NG, OG, PEG, GT), 2x/day  famotidine (PEPCID) 10 mg/mL injection, 10 mg, Intravenous, Daily  NS flush syringe, 2-6 mL, Intracatheter, Q8HRS   And  NS flush syringe, 2-6 mL, Intracatheter, Q1 MIN PRN  perflutren lipid microspheres (DEFINITY) 1.3 mL in NS 10 mL (tot vol) injection, 2 mL, Intravenous, Give in Cardiology  propofoL (DIPRIVAN) 10 mg/mL premix infusion ---Cabinet Override,  , ,   propofol (DIPRIVAN) 10 mg/mL premix infusion, 10 mcg/kg/min (Adjusted), Intravenous, Continuous  senna concentrate (SENNA) '528mg'$  per 49m oral liquid, 10 mL, Gastric (NG, OG, PEG, GT), 2x/day  SSIP insulin lispro 100 units/mL injection, 0-12 Units, Subcutaneous, Q4H PRN  Vancomycin IV - Pharmacist to Dose per Protocol, , Does not apply, Daily PRN  Vancomycin IV Intermittent Dosing, , Does not apply, Daily PRN      Past Family History:   Family Medical History:    None         Social History     Socioeconomic History   . Marital status: Divorced       Substance Use Screening and Counseling:  Counseling performed for Tobacco use, Unhealthy Alcohol use (greater than 7 drinks per week) and Illicit Drug Use based on social history    Exam:   Heart Rate: (!) 112  BP (Non-Invasive): 102/72  Respiratory Rate: 19  SpO2: 99 %  Constitutional:  appears chronically ill, acutely ill, moderately obese, no distress and vital signs reviewed  Eyes:  Conjunctiva clear., Pupils equal and round. , Sclera non-icteric.   ENT:  Mouth mucous membranes moist. , Oral Cavity/Oropharynx:  Poor oral hygiene.  Neck:  no thyromegaly or lymphadenopathy and Neck ROM:  WNL  Respiratory:  decreased breath sounds throughout all lung fields, rhonchi anteriorly, Thoracic excursion  Normal  Cardiovascular:     Tachycardia, +murmur, no edema, peripheral pulses intact.  Gastrointestinal:  Soft, non-tender, non-distended, Bowel sounds: Hypoactive  Genitourinary:  foley intact with clear, yellow urine  Musculoskeletal:  Head atraumatic and normocephalic, AROM and PROM  Integumentary:  Skin warm and dry  Neurologic:  No focal deficit, MAE, +agitation    Labs:  Results for [name] [name], [name] [name](MRN [id number] as of 02/05/2021 03:48   Ref. Range 02/05/2021 00:55   WBC Latest Ref Range: 3.7 - 11.0 x10^3/uL 11.3 (H)   HGB Latest Ref Range: 11.5 - 16.0 g/dL 8.5 (L)   HCT Latest Ref Range: 34.8 - 46.0 % 25.7 (L)   PLATELET COUNT Latest Ref Range: 150 - 400 x10^3/uL 88  (L)   RBC Latest Ref Range: 3.85 - 5.22 x10^6/uL 3.61 (L)   MCV Latest Ref Range: 78.0 - 100.0 fL 71.2 (L)   MCHC Latest Ref Range: 31.0 - 35.5 g/dL 33.1   MCH Latest Ref Range: 26.0 - 32.0 pg 23.5 (L)   RDW-CV Latest Ref Range: 11.5 - 15.5 % 22.6 (H)   BANDS Latest Units: % 2   PMN'S Latest Units: % 92   LYMPHOCYTES Latest Units: % 2   EOSINOPHIL Latest Units: % 0   MONOCYTES Latest Units: % 4   BASOPHILS Latest Units: % 0   METAMYELOCYTES Latest Units: % 1   PMN ABS Latest Ref Range: 1.50 - 7.70 x10^3/uL 10.62 (H)   LYMPHS ABS Latest Ref Range: 1.00 - 4.80 x10^3/uL 0.23 (L)   EOS ABS Latest Ref Range: <=0.50 x10^3/uL <0.10   MONOS ABS Latest Ref  Range: 0.20 - 1.10 x10^3/uL 0.45   BASOS ABS Latest Ref Range: <=0.20 x10^3/uL <0.10   PROTHROMBIN TIME Latest Ref Range: 9.1 - 13.9 seconds 16.0 (H)   INR Latest Ref Range: 0.80 - 1.20  1.38 (H)   BURR CELLS Latest Ref Range: None  2+/Moderate (A)   SODIUM Latest Ref Range: 136 - 145 mmol/L 138   POTASSIUM Latest Ref Range: 3.5 - 5.1 mmol/L 4.0   CHLORIDE Latest Ref Range: 96 - 111 mmol/L 104   CARBON DIOXIDE Latest Ref Range: 22 - 30 mmol/L 21 (L)   BUN Latest Ref Range: 8 - 25 mg/dL 54 (H)   CREATININE Latest Ref Range: 0.60 - 1.05 mg/dL 6.21 (H)   ANION GAP Latest Ref Range: 4 - 13 mmol/L 13   ESTIMATED GLOMERULAR FILTRATION RATE Latest Ref Range: >=60 mL/min/BSA 8 (L)   MAGNESIUM Latest Ref Range: 1.8 - 2.6 mg/dL 2.4   PHOSPHORUS Latest Ref Range: 2.4 - 4.7 mg/dL 7.7 (H)   CHOLESTEROL Latest Ref Range: 100 - 200 mg/dL 96 (L)   HDL-CHOLESTEROL Latest Ref Range: >=50 mg/dL <5 (L)   LDL (CALCULATED) Unknown SEE COMMENT   TRIGLYCERIDES Latest Ref Range: <150 mg/dL 214 (H)   VLDL (CALCULATED) Latest Ref Range: <30 mg/dL 43 (H)   CHOL/HDL RATIO Unknown SEE COMMENT   NON - HDL (CALCULATED) Unknown SEE COMMENT   TSH Latest Ref Range: 0.430 - 3.550 uIU/mL 2.365   AST (SGOT) Latest Ref Range: 8 - 45 U/L 19   ALT (SGPT) Latest Ref Range: 8 - 22 U/L 10         Diagnostic  Tests/Radiology Tests:    TTE 02/03/21  1. Cardiac chamber size was normal  2. No thrombus or mass in cardiac chamber   3. No atrial septal defect, no ventricular septal defect  4. No pericardial effusion   5. Thickness of left and right ventricular walls was normal  6. Systolic function of left and right ventricular walls was normal. LVEF 55-60%  7. Aortic root was normal size. Aortic and mitral valves were pliable with adequate excursion of leaflets. Aortic valve was tri leaflet  8. There was a large vegetation seen on the tricuspid valve measuring 1.19 X 1.67 cm   9. Doppler and color flow studies reveal:     A. Reversal of E to A ration.      B. Tissue doppler analysis of mitral valve annulus was consistent with normal velocity ratio.      C. There was trivial mitral valve and moderate tricuspid regurgitation     D. Estimated systolic pulmonary pressure was 15mHg.      E. Maximum gradient across the aortic valve was 9 and mean was 5 mmHg.      F. Maximum gradient across left ventricular outflow tract was 2 and mean was 347mg.     G. Mean trans mitral diastolic gradient was 94m36m.      XR AP MOBILE CHEST performed on 02/05/2021 1:17 AM    INDICATION: [age]ars old Female; Mechanical vent    TECHNIQUE: 1 views of the chest; 1 images    COMPARISON: None available    SUPPORT DEVICES: Endotracheal tube tip projects about 2 cm from the carina. Enteric tube tip projects below the diaphragm.    FINDINGS:  The heart is normal in size. There is no significant pleural effusion or evidence of pneumothorax. Few tiny nodular opacities with possible cavitation for example at left lung base, concerning for  infectious/inflammatory process.    IMPRESSION:  1.Few nodular opacities with possible cavitation, concerning for infectious/inflammatory process.  2.Endotracheal tube tip projects about 2 cm from the carina.      Images obtained from [place].    Records reviewed from [place] .    Plan     ASSESSMENT & PLAN:  [name] [name] is a 35 y.o. female with PMH significant for IVDU and current smoker, who is transferred for cardiac surgery evaluation for MRSA bacteremia and TV endocarditis. She remains intubated on arrival, hemodynamically stable with no vasopressor requirements. Will discuss plan with cardiac surgeon today and continue with preoperative testing.    Will obtain a formal TTE this am  CT Brain/ Chest/ Abdomen/ Pelvis  CT Facial for dental evaluation  Repeat blood cultures  Continue Vancomycin   Supportive care per ICU team    Patient Active Problem List    Diagnosis Date Noted   . Endocarditis 02/05/2021       Patient has decision making capacity:  no  Advance Directive:  No Advance Directive  DNR Status:  Full Code    DVT RISK FACTORS HAVE BEEN ASSESSED AND PROPHYLAXIS ORDERED (SEE RUBYONLINE - REFERENCE TOOLS - MD, DVT PROPHY OR POCKET CARD):  YES      Warden Fillers, APRN  02/05/2021 03:08    I have personally seen and examined this patient, reviewed imaging and clinical information, and formulated and agree with the detailed management plan as documented above. I have spent 30 minutes of this clinical encounter in counseling and direct patient care without APP overlap.  Plan continue workup and hopefully extubate in the next few days.  Would like to assess her willingness to undergo drug rehab and family support before considering surgery.     C. Lacinda Axon, MD      Copies sent to Care Team       Relationship Specialty Notifications Start End    Pcp, No PCP - General   02/04/21

## 2021-02-06 ENCOUNTER — Inpatient Hospital Stay (HOSPITAL_COMMUNITY): Payer: Medicaid Other

## 2021-02-06 ENCOUNTER — Encounter (HOSPITAL_COMMUNITY): Payer: Self-pay

## 2021-02-06 DIAGNOSIS — F17200 Nicotine dependence, unspecified, uncomplicated: Secondary | ICD-10-CM

## 2021-02-06 DIAGNOSIS — I38 Endocarditis, valve unspecified: Secondary | ICD-10-CM

## 2021-02-06 DIAGNOSIS — R918 Other nonspecific abnormal finding of lung field: Secondary | ICD-10-CM

## 2021-02-06 DIAGNOSIS — R319 Hematuria, unspecified: Secondary | ICD-10-CM

## 2021-02-06 DIAGNOSIS — B9562 Methicillin resistant Staphylococcus aureus infection as the cause of diseases classified elsewhere: Secondary | ICD-10-CM

## 2021-02-06 DIAGNOSIS — Z4682 Encounter for fitting and adjustment of non-vascular catheter: Secondary | ICD-10-CM

## 2021-02-06 DIAGNOSIS — J9601 Acute respiratory failure with hypoxia: Secondary | ICD-10-CM

## 2021-02-06 DIAGNOSIS — R809 Proteinuria, unspecified: Secondary | ICD-10-CM

## 2021-02-06 DIAGNOSIS — D696 Thrombocytopenia, unspecified: Secondary | ICD-10-CM

## 2021-02-06 DIAGNOSIS — Z978 Presence of other specified devices: Secondary | ICD-10-CM

## 2021-02-06 DIAGNOSIS — J690 Pneumonitis due to inhalation of food and vomit: Secondary | ICD-10-CM

## 2021-02-06 DIAGNOSIS — I2690 Septic pulmonary embolism without acute cor pulmonale: Secondary | ICD-10-CM

## 2021-02-06 DIAGNOSIS — F19239 Other psychoactive substance dependence with withdrawal, unspecified: Secondary | ICD-10-CM

## 2021-02-06 DIAGNOSIS — I272 Pulmonary hypertension, unspecified: Secondary | ICD-10-CM

## 2021-02-06 DIAGNOSIS — J90 Pleural effusion, not elsewhere classified: Secondary | ICD-10-CM

## 2021-02-06 DIAGNOSIS — R000 Tachycardia, unspecified: Secondary | ICD-10-CM

## 2021-02-06 DIAGNOSIS — G9341 Metabolic encephalopathy: Secondary | ICD-10-CM

## 2021-02-06 DIAGNOSIS — N179 Acute kidney failure, unspecified: Secondary | ICD-10-CM

## 2021-02-06 DIAGNOSIS — T50904A Poisoning by unspecified drugs, medicaments and biological substances, undetermined, initial encounter: Secondary | ICD-10-CM

## 2021-02-06 DIAGNOSIS — Z452 Encounter for adjustment and management of vascular access device: Secondary | ICD-10-CM

## 2021-02-06 DIAGNOSIS — D638 Anemia in other chronic diseases classified elsewhere: Secondary | ICD-10-CM

## 2021-02-06 LAB — CBC
HCT: 23.7 % — ABNORMAL LOW (ref 34.8–46.0)
HGB: 7.8 g/dL — ABNORMAL LOW (ref 11.5–16.0)
MCH: 23.8 pg — ABNORMAL LOW (ref 26.0–32.0)
MCHC: 32.9 g/dL (ref 31.0–35.5)
MCV: 72.3 fL — ABNORMAL LOW (ref 78.0–100.0)
MPV: 10.5 fL (ref 8.7–12.5)
PLATELETS: 75 10*3/uL — ABNORMAL LOW (ref 150–400)
RBC: 3.28 10*6/uL — ABNORMAL LOW (ref 3.85–5.22)
RDW-CV: 22.9 % — ABNORMAL HIGH (ref 11.5–15.5)
WBC: 13.8 10*3/uL — ABNORMAL HIGH (ref 3.7–11.0)

## 2021-02-06 LAB — PTT (PARTIAL THROMBOPLASTIN TIME): APTT: 32.9 seconds (ref 24.2–37.5)

## 2021-02-06 LAB — VENOUS BLOOD GAS, CO-OX, LYTES, LACTATE REFLEX
%FIO2 (VENOUS): 30 %
BASE DEFICIT: 2.7 mmol/L (ref <=3.0)
BICARBONATE (VENOUS): 22.1 mmol/L (ref 22.0–26.0)
CARBOXYHEMOGLOBIN: 1.4 % (ref 0.0–2.5)
CHLORIDE: 101 mmol/L (ref 101–111)
GLUCOSE: 85 mg/dL (ref 60–105)
HEMOGLOBIN: 8.1 g/dL — ABNORMAL LOW (ref 12.0–18.0)
IONIZED CALCIUM: 1.09 mmol/L — ABNORMAL LOW (ref 1.10–1.35)
LACTATE: 0.9 mmol/L (ref 0.0–1.3)
O2CT: 6.1 % — ABNORMAL LOW (ref 6.7–17.5)
OXYHEMOGLOBIN: 52.9 % (ref 40.0–80.0)
PCO2 (VENOUS): 35 mm/Hg — ABNORMAL LOW (ref 41–51)
PH (VENOUS): 7.4 (ref 7.31–7.41)
PO2 (VENOUS): 39 mm/Hg (ref 35–50)
SODIUM: 133 mmol/L — ABNORMAL LOW (ref 137–145)
WHOLE BLOOD POTASSIUM: 4.4 mmol/L (ref 3.5–4.6)

## 2021-02-06 LAB — MRSA/MSSA MOLECULAR SCREEN, BLOOD
METHICILLIN RESISTANCE: POSITIVE — AB
STAPHYLOCOCCUS AUREUS: POSITIVE — AB

## 2021-02-06 LAB — POC BLOOD GLUCOSE (RESULTS)
GLUCOSE, POC: 92 mg/dl (ref 70–105)
GLUCOSE, POC: 112 mg/dl — ABNORMAL HIGH (ref 70–105)
GLUCOSE, POC: 105 mg/dl (ref 70–105)
GLUCOSE, POC: 122 mg/dl — ABNORMAL HIGH (ref 70–105)

## 2021-02-06 LAB — ARTERIAL BLOOD GAS, CO-OX, LYTES, LACTATE REFLEX
%FIO2 (ARTERIAL): 30 %
BASE DEFICIT: 3.8 mmol/L — ABNORMAL HIGH (ref 0.0–3.0)
BICARBONATE (ARTERIAL): 21.9 mmol/L (ref 18.0–26.0)
CARBOXYHEMOGLOBIN: 2.3 % (ref 0.0–2.5)
CHLORIDE: 104 mmol/L (ref 101–111)
GLUCOSE: 87 mg/dL (ref 60–105)
HEMATOCRITRT: 30 %
HEMOGLOBIN: 10 g/dL — ABNORMAL LOW (ref 12.0–18.0)
IONIZED CALCIUM: 1.05 mmol/L — ABNORMAL LOW (ref 1.10–1.35)
LACTATE: 1.1 mmol/L (ref 0.0–1.3)
O2CT: 13.5 % — ABNORMAL LOW (ref 15.7–24.3)
OXYHEMOGLOBIN: 95.2 % (ref 85.0–98.0)
PAO2/FIO2 RATIO: 303 (ref <=200)
PCO2 (ARTERIAL): 33 mm/Hg — ABNORMAL LOW (ref 35–45)
PH (ARTERIAL): 7.4 (ref 7.35–7.45)
PO2 (ARTERIAL): 91 mm/Hg (ref 72–100)
SODIUM: 133 mmol/L — ABNORMAL LOW (ref 137–145)
WHOLE BLOOD POTASSIUM: 4.5 mmol/L (ref 3.5–4.6)

## 2021-02-06 LAB — HEPATIC FUNCTION PANEL
ALBUMIN: 1.3 g/dL — ABNORMAL LOW (ref 3.5–5.0)
ALBUMIN: 1.3 g/dL — ABNORMAL LOW (ref 3.5–5.0)
ALKALINE PHOSPHATASE: 181 U/L — ABNORMAL HIGH (ref 40–110)
ALKALINE PHOSPHATASE: 173 U/L — ABNORMAL HIGH (ref 40–110)
ALT (SGPT): 13 U/L (ref 8–22)
ALT (SGPT): 11 U/L (ref 8–22)
AST (SGOT): 26 U/L (ref 8–45)
AST (SGOT): 23 U/L (ref 8–45)
BILIRUBIN DIRECT: 0.6 mg/dL — ABNORMAL HIGH (ref 0.1–0.4)
BILIRUBIN DIRECT: 0.6 mg/dL — ABNORMAL HIGH (ref 0.1–0.4)
BILIRUBIN TOTAL: 0.8 mg/dL (ref 0.3–1.3)
BILIRUBIN TOTAL: 0.9 mg/dL (ref 0.3–1.3)
PROTEIN TOTAL: 6.3 g/dL — ABNORMAL LOW (ref 6.4–8.3)
PROTEIN TOTAL: 5.9 g/dL — ABNORMAL LOW (ref 6.0–7.9)

## 2021-02-06 LAB — MAGNESIUM: MAGNESIUM: 2.6 mg/dL (ref 1.8–2.6)

## 2021-02-06 LAB — PT/INR
INR: 1.44 — ABNORMAL HIGH (ref 0.80–1.20)
PROTHROMBIN TIME: 16.7 seconds — ABNORMAL HIGH (ref 9.1–13.9)

## 2021-02-06 LAB — BRONCHOALVEOLAR LAVAGE (BAL) QUANTITATIVE CULTURE/GRAM STAIN
BRONCHOALVEOLAR LAVAGE (BAL) QUANTITATIVE CULTURE: 15000 — AB
BRONCHOALVEOLAR LAVAGE (BAL) QUANTITATIVE CULTURE: 2000

## 2021-02-06 LAB — BASIC METABOLIC PANEL
ANION GAP: 12 mmol/L (ref 4–13)
BUN/CREA RATIO: 10 (ref 6–22)
BUN: 69 mg/dL — ABNORMAL HIGH (ref 8–25)
CALCIUM: 7.5 mg/dL — ABNORMAL LOW (ref 8.5–10.0)
CHLORIDE: 101 mmol/L (ref 96–111)
CO2 TOTAL: 22 mmol/L (ref 22–30)
CREATININE: 6.86 mg/dL — ABNORMAL HIGH (ref 0.60–1.05)
ESTIMATED GFR: 7 mL/min/BSA — ABNORMAL LOW (ref >=60)
GLUCOSE: 87 mg/dL (ref 65–125)
POTASSIUM: 4.4 mmol/L (ref 3.5–5.1)
SODIUM: 135 mmol/L — ABNORMAL LOW (ref 136–145)

## 2021-02-06 LAB — HEPATITIS A (HAV) IGG ANTIBODY: HAV IGG ANTIBODY: REACTIVE — AB

## 2021-02-06 LAB — TRIGLYCERIDE: TRIGLYCERIDES: 336 mg/dL — ABNORMAL HIGH (ref <150)

## 2021-02-06 LAB — ARTERIAL BLOOD GAS, CO-OX, LYTES, LACTATE REFLEX - INACTIVE: MET-HEMOGLOBIN: 0.9 % (ref 0.0–2.0)

## 2021-02-06 LAB — HEPATITIS C VIRUS (HCV) RNA DETECTION AND QUANTIFICATION, PCR, PLASMA: HCV QUANTITATIVE PCR: NOT DETECTED

## 2021-02-06 LAB — PHOSPHORUS: PHOSPHORUS: 7.8 mg/dL — ABNORMAL HIGH (ref 2.4–4.7)

## 2021-02-06 LAB — VENOUS BLOOD GAS, CO-OX, LYTES, LACTATE REFLEX - INACTIVE: MET-HEMOGLOBIN: 0.2 % (ref 0.0–2.0)

## 2021-02-06 MED ORDER — CALCIUM CHLORIDE 100 MG/ML (10 %) INTRAVENOUS SYRINGE
INJECTION | INTRAVENOUS | Status: AC
Start: 2021-02-06 — End: 2021-02-06
  Administered 2021-02-06 (×2): 1000 mg via INTRAVENOUS
  Filled 2021-02-06: qty 10

## 2021-02-06 MED ORDER — QUETIAPINE 50 MG TABLET
50.0000 mg | ORAL_TABLET | Freq: Two times a day (BID) | ORAL | Status: DC
Start: 2021-02-06 — End: 2021-02-08
  Administered 2021-02-06 – 2021-02-08 (×6): 50 mg via GASTROSTOMY
  Filled 2021-02-06 (×4): qty 1

## 2021-02-06 MED ORDER — CALCIUM CHLORIDE 100 MG/ML (10 %) INTRAVENOUS SYRINGE
1000.0000 mg | INJECTION | INTRAVENOUS | Status: AC
Start: 2021-02-06 — End: 2021-02-06

## 2021-02-06 MED ORDER — KETAMINE 10 MG/ML INJECTION WRAPPER
50.0000 mg | INTRAMUSCULAR | Status: AC
Start: 2021-02-06 — End: 2021-02-06
  Administered 2021-02-06: 50 mg via INTRAVENOUS

## 2021-02-06 MED ORDER — FENTANYL (PF) 50 MCG/ML INJECTION SOLUTION
INTRAMUSCULAR | Status: AC
Start: 2021-02-06 — End: 2021-02-06
  Administered 2021-02-06: 50 ug via INTRAVENOUS
  Filled 2021-02-06: qty 2

## 2021-02-06 MED ORDER — FENTANYL (PF) 50 MCG/ML INJECTION SOLUTION
25.0000 ug | Freq: Once | INTRAMUSCULAR | Status: AC
Start: 2021-02-06 — End: 2021-02-06
  Administered 2021-02-06: 25 ug via INTRAVENOUS
  Filled 2021-02-06: qty 2

## 2021-02-06 MED ORDER — SODIUM CHLORIDE 0.9 % IV BOLUS
40.0000 mL | INJECTION | Freq: Once | Status: AC | PRN
Start: 2021-02-06 — End: 2021-02-06

## 2021-02-06 MED ORDER — KETAMINE 10 MG/ML INJECTION WRAPPER
50.0000 mg | Freq: Once | INTRAMUSCULAR | Status: AC
Start: 2021-02-06 — End: 2021-02-06

## 2021-02-06 MED ORDER — PRISMASATE (BICARBONATE-BASED) 4K DIALYSATE
ARTERIOVENOUS_FISTULA | Status: DC
Start: 2021-02-06 — End: 2021-02-07

## 2021-02-06 MED ORDER — MICONAZOLE NITRATE 2 % TOPICAL CREAM
TOPICAL_CREAM | Freq: Two times a day (BID) | CUTANEOUS | Status: DC
Start: 2021-02-06 — End: 2021-03-02
  Administered 2021-02-21: 0 mL via TOPICAL
  Filled 2021-02-06 (×3): qty 42.5

## 2021-02-06 MED ORDER — QUETIAPINE 50 MG TABLET
50.0000 mg | ORAL_TABLET | Freq: Two times a day (BID) | ORAL | Status: DC
Start: 2021-02-06 — End: 2021-02-06
  Filled 2021-02-06: qty 1

## 2021-02-06 MED ORDER — DEXTROSE 10 % IN WATER (D10W) BOLUS
250.0000 mL | INJECTION | INTRAVENOUS | Status: DC
Start: 2021-02-06 — End: 2021-02-06

## 2021-02-06 MED ORDER — CALCIUM CHLORIDE 100 MG/ML (10 %) INTRAVENOUS SYRINGE
INJECTION | INTRAVENOUS | Status: AC
Start: 2021-02-06 — End: 2021-02-06
  Administered 2021-02-06: 1000 mg via INTRAVENOUS
  Filled 2021-02-06: qty 10

## 2021-02-06 MED ORDER — FAMOTIDINE 20 MG TABLET
10.0000 mg | ORAL_TABLET | Freq: Every day | ORAL | Status: DC
Start: 2021-02-07 — End: 2021-02-08
  Administered 2021-02-07 – 2021-02-08 (×3): 10 mg via GASTROSTOMY
  Filled 2021-02-06 (×3): qty 1

## 2021-02-06 MED ORDER — KETAMINE 10 MG/ML INJECTION WRAPPER
100.0000 mg | Freq: Once | INTRAMUSCULAR | Status: DC
Start: 2021-02-07 — End: 2021-02-07
  Filled 2021-02-06: qty 10

## 2021-02-06 MED ORDER — ACETAMINOPHEN 160 MG/5 ML ORAL LIQUID WRAPPER
650.0000 mg | Freq: Once | ORAL | Status: AC
Start: 2021-02-06 — End: 2021-02-06
  Administered 2021-02-06: 650 mg via GASTROSTOMY
  Filled 2021-02-06: qty 20.3

## 2021-02-06 MED ORDER — SODIUM CITRATE 4 % (3 ML) INTRA-CATHETER INJECTION SYRINGE
2.0000 | INJECTION | Status: DC | PRN
Start: 2021-02-06 — End: 2021-03-02
  Administered 2021-02-06 – 2021-02-12 (×3): 2
  Filled 2021-02-06 (×4): qty 6
  Filled 2021-02-06: qty 3

## 2021-02-06 MED ORDER — VANCOMYCIN 1 GRAM/200 ML IN DEXTROSE 5 % INTRAVENOUS PIGGYBACK
15.0000 mg/kg | INJECTION | INTRAVENOUS | Status: DC
Start: 2021-02-06 — End: 2021-02-10
  Administered 2021-02-06: 0 mg via INTRAVENOUS
  Administered 2021-02-06: 1000 mg via INTRAVENOUS
  Administered 2021-02-07: 0 mg via INTRAVENOUS
  Administered 2021-02-07: 1000 mg via INTRAVENOUS
  Administered 2021-02-08: 0 mg via INTRAVENOUS
  Administered 2021-02-08 – 2021-02-09 (×3): 1000 mg via INTRAVENOUS
  Administered 2021-02-09: 0 mg via INTRAVENOUS
  Administered 2021-02-09: 1000 mg via INTRAVENOUS
  Filled 2021-02-06 (×4): qty 200

## 2021-02-06 MED ORDER — KETAMINE 10 MG/ML INJECTION WRAPPER
INTRAMUSCULAR | Status: AC
Start: 2021-02-06 — End: 2021-02-06
  Administered 2021-02-06: 50 mg via INTRAVENOUS
  Filled 2021-02-06: qty 10

## 2021-02-06 MED ORDER — HYDROMORPHONE 1 MG/ML INJECTION WRAPPER
0.5000 mg | INJECTION | INTRAMUSCULAR | Status: DC | PRN
Start: 2021-02-06 — End: 2021-02-06
  Filled 2021-02-06: qty 1

## 2021-02-06 MED ORDER — HYDROMORPHONE 1 MG/ML INJECTION WRAPPER
0.5000 mg | INJECTION | INTRAMUSCULAR | Status: DC | PRN
Start: 2021-02-06 — End: 2021-02-06
  Administered 2021-02-06 (×2): 0.5 mg via INTRAVENOUS

## 2021-02-06 MED ORDER — ACETAMINOPHEN 325 MG TABLET
650.0000 mg | ORAL_TABLET | ORAL | Status: DC | PRN
Start: 2021-02-06 — End: 2021-02-13
  Administered 2021-02-11 – 2021-02-12 (×3): 650 mg via ORAL
  Filled 2021-02-06 (×4): qty 2

## 2021-02-06 MED ORDER — SODIUM CITRATE 4 % (3 ML) INTRA-CATHETER INJECTION SYRINGE
3.0000 | INJECTION | Freq: Once | Status: AC
Start: 2021-02-06 — End: 2021-02-06
  Administered 2021-02-06: 3

## 2021-02-06 MED ORDER — HYDROMORPHONE 1 MG/ML INJECTION WRAPPER
0.3000 mg | INJECTION | INTRAMUSCULAR | Status: DC | PRN
Start: 2021-02-06 — End: 2021-02-09
  Administered 2021-02-06 – 2021-02-09 (×13): 0.3 mg via INTRAVENOUS
  Filled 2021-02-06 (×13): qty 1

## 2021-02-06 MED ORDER — FENTANYL (PF) 50 MCG/ML INJECTION SOLUTION
50.0000 ug | INTRAMUSCULAR | Status: AC
Start: 2021-02-06 — End: 2021-02-06

## 2021-02-06 NOTE — Care Plan (Signed)
VENTILATOR - CPAP(PS) / SPONTANEOUS  CONTINUOUS     Discontinue     Duration: Until Specified    Priority: Routine          Question Answer Comment   FIO2 (%) 30    Peep(cm/H2O) 5    Pressure Support(cm/H2O) 8    Indications IMPROVE DISTRIBUTION OF VENTILATION    Invasive/Non-Invasive INVASIVE          PATIENT WAS CHANGED FROM PC VENTILATION TO PS AROUND 0930. PATIENT HAS BEEN TOLERATING WELL. ETT HUB PERIODICALLY POPS OFF CIRCUIT, NO SIGNS OF HYPOXMIA OCCUR. TEAM IS AWARE. XOPENEX/ATROVENT ORDERED TID, PULMICORT BID. RT WILL CONTINUE TO MONITOR.

## 2021-02-06 NOTE — Consults (Signed)
[place]    Date of Service: 02/06/2021  [name] [name]  Date of Admission: 02/05/2021  Consult requested by Cardiac Surgery, Dr. Lacinda Axon    Reason for consult:   Anuric Acute Renal Failure    Assessment:  Acute Renal Failure   Baseline Scr Unknown , Baseline GFR unknown   UA Significant hematuria, significant proteinuria   Renal imaging: CT 8/22 showed no signs of hydronephrosis or renal calculi  Electrolytes Within acceptable parameters  Acid/base imbalance: Within acceptable parameters   Corr Ca 9.5 , Phos7.8, iPTH not checked   Infectious disease   BC: (+) GPC, BAL (+) MRSA  UC: not done  Abx: Vancomycin  Cardiology  TTE: EF 62% with tricuspid vegetation   Cardiac cath: no result available   Home meds   Unknown     Recommendations:  Unfortunately, patient BP has been acutely lower this afternoon, options would be to attempt HD on a vasopressor, or to initiate CVVHD  Trend daily BMP, Mg, Phos or q6H if initiating CVVHD  Renally dose all meds   Avoid nephrotoxic agents/meds   Strict I/Os   Optimize nutrition     HPI: (Obtained from Primary H&P)  35 y.o. "female with a significant PMH for Smoking and IVDU presented to [facility] with AMS on 01/31/21. 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 Urine drug screen + for heroin and methamphetamines.     [place] ""    Nephrology has been consulted for HD.    No past medical history on file.  ETOH and IVDU   no known past medical or surgical history       Social/Family History/ROS:   As per HPI by primary team     I have reviewed the current medications     Objective:  Filed Vitals:    02/06/21 1200 02/06/21 1222  02/06/21 1300 02/06/21 1302   BP: (!) 91/50  (!) 90/48    Pulse: (!) 120  (!) 122 (!) 122   Resp: 16  (!) 22 (!) 24   Temp: 37.7 C (99.9 F)  37.5 C (99.5 F) 37.5 C (99.5 F)   SpO2: 97% 97% 99% 99%     General: appears older than stated age, not in distress   Eyes: Conjunctiva clear  HENT: mucous membranes moist, ETT present   Neck: No JVD  Lungs: clear to auscultation bilaterally, no increased work of breathing  Cardiovascular: Tachycardic, regular   Abdomen: soft, non-tender, bowel sounds normal and non-distended  Extremities: no cyanosis, swelling noted to bilateral upper extremities   Skin: Skin warm and dry  Neurologic: sedated   Dialysis Access: R [place]    IO  08/22 0700 - 08/23 0659  In: 746.53 [P.O.:60; I.V.:566.53; OG:120]  Out: 50 [Urine:50]     Labs:  I have reviewed the labs     Imaging: reviewed     Thank you for this consultation.  Will continue to follow.      AJohnney Killian MD  Nephrology Fellow PGY-IV  Nephrology Consult Team [email or web address]         Attending Note:    I saw and examined the patient, personally performed the critical or key  portions of the service and discussed patient management with the Nephrology team including the resident,fellow and ancillary staff.  Patient was examined during morning rounds.  I reviewed Dr. Luis Abed note and agree with the history, findings and plan of care.  Exceptions are edited/noted above.  Laboratory, hemodynamic and radiological data were also reviewed and discussed.    Nathaniel Man, MD  Assistant Professor  Section of Nephrology  Section of [place]    Department of [place], [place] 02/06/2021, 15:52

## 2021-02-06 NOTE — Nurses Notes (Signed)
02/06/21 2000   Restraint Type(Q2 Hours)   Restraint Used M   Soft Restraint Right Wrist CONTINUED   Soft Restraint Left Wrist CONTINUED   Pt Assessment (Q2 Hours)   Reason For Restraint Limit ability/pt attempting to remove artificial airway;Limit ability/pt attempting to pull IV lines;Limit ability/pt attempting to pull tubes;Intermittent break through RASS>-2   Pt's current behavior/status Agitated;Restless   Emotional Support Provided   Restraint Monitoring (Q2 Hours)   Assessed For Continued Need Yes   Visual Check Agitated/Restless   Circulation No signs of injury   Restraint Removed for Assessment Yes   Range of Motion Performed   Fluids NPO   Food/Meal/Bottle NPO   Elimination Anuric   Education Group (Q2 Hours)   Discontinuation Criteria Able to follow directions;Decreased agitation;Oriented to self and environment;Discontinuation of medical treatment;Resolution of contributing medical condition   Criteria Explained Yes   Patient/Co-Learner's Response NL   Restraint Continuation      Patient continues to have the following condition ETT/AMS.       Least restrictive alternatives attempted : Increase patient observation, decreased/removed stimulus, and assessed meds/medical problems    Patient continues to exhibit the following behaviors: uncooperative, belligerent, and agitated    The restraint continued to facilitate medical/surgical treatment to ensure safety.    The patient will continue to be evaluated and assessments documented on the flowsheet to ensure that the patient is released from the restraint at the earliest possible time

## 2021-02-06 NOTE — Ancillary Notes (Signed)
[facility]  MRI Technologist Note        MRI has been completed.        Haig Prophet, RTR 02/06/2021, 23:48

## 2021-02-06 NOTE — Nurses Notes (Signed)
ETT exchanged performed by MD Chrystie Nose and RRT. Bedside RN at bedside to assist. 100 mg Ketamine given in total. No complications during procedure. VSS. Will continue to closely assess and monitor.

## 2021-02-06 NOTE — Progress Notes (Signed)
[facility]  HVI Critical Care Consult/Progress Note    [name] [name], [name] [name], 35 y.o. female  Date of Birth: 01-26-1986  Medical Record Number:  [id number]   Inpatient Admission Date: 02/05/2021   Hospital Day:  LOS: 0 days     Chief Complaint: Altered Mental Status   History of Present Illness: Pt is a 35 yo female with a significant PMH for Smoking and IVDU presented to [facility] with AMS on 01/31/21. She was found to have a drug overdose, aspiration PNA requiring intubation, MRSA bacteremia, large TV endocarditis with moderate TR and elevated systemic pulmonary pressures (34mhg), AKI requiring HD, anemia requiring PRBC and thrombocytopenia. Urine drug screen + for heroin and methamphetamines.    [facility] [place] [facility]    [facility]: [facility]  [facility]: See above.   -Prop for sedation. Agitated; MAE; does not follow. Repeat CT Head/CAP. Hemodynamics stable. Intubated. Repeated blood cultures. Continue Vanc.   8/23: had pan CT 8/22, still not awakening/folowing commands on precedex  But agitated. MRI brain ordered, started on CRRT   Past Medical History: No past medical history on file.    Past Surgical History: No past surgical history on file.   Social History:  Social History           Socioeconomic History   . Marital status: Divorced     Spouse name: Not on file   . Number of children: Not on file   . Years of education: Not on file   . Highest education level: Not on file   Occupational History   . Not on file   Tobacco Use   . Smoking status: Not on file   . Smokeless tobacco: Not on file   Substance and Sexual Activity   . Alcohol use: Not on file   . Drug use: Not on file   . Sexual activity: Not on file   Other Topics Concern   . Not on file   Social History Narrative   . Not on file     Social Determinants of Health     Financial Resource  Strain: Not on file   Food Insecurity: Not on file   Transportation Needs: Not on file   Physical Activity: Not on file   Stress: Not on file   Intimate Partner Violence: Not on file   Housing Stability: Not on file      Family History: Family Medical History:       None             Allergies: No Known Allergies   ROS:  Unobtainable because intubated/sedated      Problem List:       Patient Active Problem List   Diagnosis   . Endocarditis       Medications:  Scheduled Meds: chlorhexidine gluconate, 15 mL, 2x/day  docusate sodium, 100 mg, 2x/day  famotidine, 10 mg, Daily  NS flush, 2-6 mL, Q8HRS  perflutrn lipid microspheres (DEFINITY) DILUTION injection, 2 mL, Give in Cardiology  propofoL, ,   senna concentrate, 10 mL, 2x/day      IV Infusions: propofoL, Last Rate: 20 mcg/kg/min (02/05/21 0220)      PRN:  NS flush, 2-6 mL, Q1 MIN PRN  insulin lispro, 0-12 Units, Q4H PRN  Pharmacy to Dose Vancomycin, , Daily PRN  Vancomycin IV Intermittent Dosing, , Daily PRN  Last VS:    Heart Rate: (!) 126  Respiratory Rate: (!) 23  BP (Non-Invasive): 130/84    Physical Exam  Vitals and nursing note reviewed.   Constitutional:       General: She is in acute distress.      Appearance: She is ill-appearing.      Comments: Critically ill appearing female, distressed, agitated, not following commands   HENT:      Head: Normocephalic and atraumatic.      Nose: Nose normal.      Mouth/Throat:      Mouth: Mucous membranes are moist.   Eyes:      General: No scleral icterus.        Right eye: No discharge.         Left eye: No discharge.      Extraocular Movements: Extraocular movements intact.      Conjunctiva/sclera: Conjunctivae normal.      Pupils: Pupils are equal, round, and reactive to light.   Cardiovascular:      Rate and Rhythm: Tachycardia present.      Pulses: Normal pulses.      Heart sounds: Murmur heard.   Pulmonary:      Effort: Pulmonary effort is normal. No respiratory distress.      Breath sounds:  No stridor. No wheezing, rhonchi or rales.      Comments: On mechanical vent  Abdominal:      General: Abdomen is flat. Bowel sounds are normal. There is no distension.      Palpations: Abdomen is soft. There is no mass.      Hernia: No hernia is present.      Comments: Unable to assess for tenderness/guarding as pt with altered mental status   Musculoskeletal:         General: Swelling present. Normal range of motion.      Cervical back: Normal range of motion and neck supple. No rigidity.      Right lower leg: Edema present.      Left lower leg: Edema present.   Skin:     General: Skin is warm and dry.      Capillary Refill: Capillary refill takes less than 2 seconds.      Findings: No bruising, lesion or rash.   Neurological:      Comments: Altered mental status- not following commands, not opening eyes to voice  Moves all 4 extremities, + agitated delirium   Psychiatric:      Comments: Unable to assess 2/2 AMS           '[x]'$ ?I have reviewed the patient's vitals signs, the nursing notes, the physician's notes and other progress notes     '[x]'$ ?I have reviewed the laboratory and imaging data    '[x]'$ ?I have reviewed the records from [place]     '[x]'$ ?I have discussed this case with CCM team      Systems Based Assessment and Plan:    Neurologic:  Data/Assessment Reviewed    Diagnosis Altered mental status 2/2 encephalopathy   Overdose 2/2 IVDU  IVD withdrawal   Course: Critical   Plan Pt given 0.5 mg ivp dilaudid which alleviated the agitation  Continue dexmedetomidine and titrate to rass 0 to -1  Wean off propofol  PRN pain medication  MRI Brain ordered  Day/night policy, reorientation as needed  PT/OT when appropriate   Serial neuro checks    8/17: outside hospital head CT negative for abnormalities   Repeat head CT 2/2  continued altered mental status 8/22 no acute abnormalities       Cardiovascular:  Data/Assessment Most Recent Hemodynamics: Reviewed   Cardiovascular support: NA     Diagnosis TV  Endocarditis    Moderate TR  Mild Pulmonary HTN   Course Critical   Plan Endocarditis:   -Repeat TTE done and reviewed  -Continue Vanc  -Repeat Blood cultures done 8/22  -Will consult SCT for surgical evaluation - they have eval'd patient and await for pt to awaken in order to pursue surgery    Serial hemodynamics  MAP >65     Respiratory:  Data/Assessment: CXR: Reviewed   Most recent VBG: Reviewed   Vent Settings: Reviewed      Diagnosis Aspiration PNA   Mechanical Ventilation   Smoker     Course Critical   Plan Wean vent as tolerated- weaned from pressure control to pressure support today  FIO2 wean as tolerated for O2 sats>92%  Continue vent support for airway protection   Pulm toilet; scheduled nebs  CT chest 8/22 showed septic pulmonary emboli         Renal:  Data/Assessment: Reviewed    Diagnosis Acute kidney injury   Course Stable   Plan Replacing electrolytes per order  Strict I&O  Foley intact  Will monitor BMP    HD 8/19 and 8/20 at outside hospital  [facility] nephrology today for HD, however pt's BP was on the lower side  CRRT initiated by CCM team this afternoon with UF to achieve euvolemia     Gastrointestinal:  Data/Assessment: Famotidine (Pepcid)  for stress ulcer prophylaxis   Diet:  NPO   Bowel Regimen:  Colace and Senna    Diagnosis Not applicable   Course  Stable   Plan Tube feeds - titrate to goal rate  Continue bowel regimen  Continue stress ulcer prophylaxis   CT abdomen/pelvis 8/22 - no acute abnormalities     Endocrine:  Data/Assessment Last three Fingerstick glucose: Reviewed   Last HbA1C: NA  Most recent Thyroid results: 2.3     Diagnosis No active diagnosis   Course Stable   Plan Regular SSI every 6 hours  Goal 140-180       Hematologic:  Data/Assessment:  Reviewed    Diagnosis Anemia of chronic disease and Thrombocytopenia   Course Stable   Plan SCDs (Sequential Compression Device)  For DVT Prophylaxis  Sub q heparin  Continue to closely monitor cbc/coags      Infectious  Diseases:  Data/Assessment Tmax last 24 hrs: No data recorded.       Diagnosis Endocarditis - TV    Course Critical   Plan Antibiotic therapy  Agent:  Vancomycin  Indication: MRSA     Will repeat cultures; UA - follow up results    Will continue to monitor for additional s/s of infection     New non tunneled HD catheter placed 8/22 in right IJ  Femoral HD catheter removed       Family Communication and Disposition:  Data/Assessment I discussed pt's condition and plan of care in detail with pt's mother at bedside during rounds.         HVI Critical Care Attending  I have seen and examined the patient and discussed the plan of care on rounds with the CVICU team at the bedside. The patient remains critically ill and at high risk of acute clinical decompensation and/or death due to the clinical conditions and diagnoses listed in this note. I have spent a total  of 40 minutes of critical care time evaluating and treating this complex critically ill patient. My time is independent of other providers and does not include any time spent on procedures or teaching during rounds.  Ernest Haber, MD

## 2021-02-06 NOTE — Nurses Notes (Signed)
Restraint Continuation      Patient continues to have the following condition mechanical ventilation, AMS.      Least restrictive alternatives attempted : Increase patient observation, moved closer to the nurses station, concealed tubes/lines, decreased/removed stimulus, reoriented to person, place, time, and treatment, encouraged relaxation techniques, and assessed meds/medical problems.    Patient continues to exhibit the following behaviors: agitated and restless.    The restraint continued to facilitate medical/surgical treatment to ensure safety.    The patient will continue to be evaluated and assessments documented on the flowsheet to ensure that the patient is released from the restraint at the earliest possible time

## 2021-02-06 NOTE — Care Plan (Signed)
Medical Nutrition Therapy Assessment        SUBJECTIVE : Patient intubated, propofol weaned off. Not currently requiring pressor support. Tube feeds ordered, trickle feed initiated.    OBJECTIVE:     Current Diet Order/Nutrition Support:   MNT PROTOCOL FOR DIETITIAN  DIET NPO - SPECIFIC DATE & TIME  ADULT TUBE FEEDING - CONTINUOUS DRIP CONTINUOUS NO MEALS, TF ONLY; NEPRO; OG; Initial Rate (ml/hr): 10; Increase by: [id number]; Goal Rate (ml/hr): 50     Height Used for Calculations: 165.1 cm ('5\' 5"'$ )  Weight Used For Calculations: 72.4 kg (159 lb 9.8 oz)  BMI (kg/m2): 26.62     Ideal Body Weight (IBW) (kg): 57.29  % Ideal Body Weight: 126.37  Adjusted/Standard Body Weight  Adjusted BW: 61 kg        Estimated Needs:    Energy Calorie Requirements: 1200-1550 kcal per day (20-25 kcals/61 kg)  Protein Requirements (gms/day): 86 g protein per day (1.5 g/57.3 kg)      Comments: [facility], pt remains intubated, on ventilator support, and in restraints. Cardiac Surgery evaluating for surgical intervention. 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       Ref. Range 02/05/2021 11:57 02/05/2021 19:34 02/06/2021 01:30   BUN Latest Ref Range: 8 - 25 mg/dL 63 (H)  69 (H)   CREATININE Latest Ref Range: 0.60 - 1.05 mg/dL 6.79 (H)  6.86 (H)      Ref. Range 02/05/2021 11:57 02/05/2021 19:34 02/06/2021 01:30   PHOSPHORUS Latest Ref Range: 2.4 - 4.7 mg/dL 7.4 (H)  7.8 (H)       Plan/Interventions :   Adjust TF regimen to better meet estimated needs -   Tube Feed Formula : Nepro  Goal Rate: 30 ml/hr + 2 packets prosource  Provides: 1416 cal = 23 cals/kg                88 g protein = 1.5 g/kg                549 ml free water     Trend weights.  Monitor renal function/mode of therapy and adjust needs as medically able.  Recommend daily nephronex.  RD to continue to follow.        Nutrition Diagnosis: Inability to swallow related to Patient on vent as evidenced by Need for  TF    EMelody Comas RDLD  02/06/2021, 11:50      Pager #[phone]

## 2021-02-06 NOTE — Care Plan (Signed)
SLP Contact Note  02/06/21  0906    SLP attempted to see patient for initial swallow evaluation at bedside. Spoke to RN re: patient's status. Patient remains intubated. SLP to follow up.     Clifton Custard, SLP  Pager #: [phone]

## 2021-02-06 NOTE — Progress Notes (Signed)
Subsequent Critical Care Note:      Recent events:  ETT malfunction and exchange      S:  (mech vent)      O:    BP: 130/82              HR:  130          Temp:  37  GEN:  critically ill, mech vent  Neuro:  pupils equal and reactive, moves all ext's spontaneously  ENT:   nares patent, ETT, OGT  Neck:  supple, no masses  Card:  Reg tachy  Resp:  diminished bases, scattered rhonchi  GI:  soft, NT, BS+  GU:  foley in place  Ext:  min edema, pulses all ext's  Integ:  no purpura or petechia      A/P:    Neurologic:  (metabolic encephalopathy, IVDU)    -RASS goal 0 to -1    -MRI pending    -pain control       Cardiovascular:  (TV endocarditis)   -cont abx   -CT surgery eval      Respiratory:   (ETT malfunction, acute hypoxic resp failure, pulm septic emboli)    -cont mech vent    -exchange ETT        Renal:  (AKI)    -Renal dose meds    -RRT titration      Gastrointestinal:      -Enteral feeds w/ bowel regimen       Endocrine:      -Gluc goal < 180      Hematologic:      -DVT ppx          CC time:   Total critical care time spent in direct care of this patient at high risk based on presenting history/exam/and complaint, including initial evaluation and stabilization, review of data, re-examination, discussion with admitting and consulting services to arrange definitive care, and exclusive of any procedures performed, was 35 mins.    Claiborne Rigg, MD  HVI Critical Care

## 2021-02-06 NOTE — Consults (Signed)
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  Still intubated.  Febrile overnight to 102 F. Still tachycardic 120s.  Requiring no pressors.  Vent setting still minimal 30% FiO2 with 5 PEEP.  Unable to obtain review of systems.  However, it should be noted the patient's right dialysis line was removed and she had a right IJ dialysis line put in place.    Objective:  Physical Exam:  Current Vitals: BP 127/79   Pulse (!) 130   Temp 37.9 C (100.2 F)   Resp (!) 23   Ht 1.651 m (_0 )   Wt 72.4 kg (159 lb 9.8 oz)   SpO2 96%   BMI 26.56 kg/m     Vitals in last 24 hours: Temp  Avg: 38.4 C (101.2 F)  Min: 37.6 C (99.68 F)  Max: 39 C (102.2 F)  MAP (Non-Invasive)  Avg: 78.6 mmHG  Min: 52 mmHG  Max: 112 mmHG  Pulse  Avg: 118.1  Min: 102  Max: 133  Resp  Avg: 24  Min: 18  Max: 33  SpO2  Avg: 97.2 %  Min: 93 %  Max: 100 %  General:  Ill appearing.  Mechanically ventilated and sedated.  Eyes: Pupils equal round. No conjunctival injection or scleral icterus.  ENT: Face symmetrical.  ET tube in place.  No mouth lesions.  Neck: Supple and symmetrical.   Right IJ in place.  Lungs: Clear to auscultation bilaterally to anterior and axillary lung fields on MV.   Cardiovascular:  Tachycardic.  Regular rhythm.  No murmur.  Abdomen:  Hypoactive bowel sounds. Distended. Soft and easily depressible.  Extremities: No joint erythema or swelling.  No cyanosis or edema.  Right AC with track marks.  Right groin with bandage. No erythema, drainage, or induration around site.  Skin: Warm and dry.  No rash or lesions.  Neurologic:  Sedated.  Psychiatric:  Unable to assess.     Inpatient Medications:  acetaminophen (TYLENOL) 151m per 566moral liquid, 650 mg, Gastric (NG, OG, PEG,  GT), Once  budesonide (PULMICORT RESPULES) 0.5 mg/2 mL nebulizer suspension, 1 mg, Nebulization, 2x/day  chlorhexidine gluconate (PERIDEX) 0.12% mouthwash, 15 mL, Swish & Spit, 2x/day  dexmedeTOMIDine (PRECEDEX) 400 mcg in NS 100 mL (tot vol) infusion, 0.2 mcg/kg/hr (Adjusted), Intravenous, Continuous  docusate sodium (COLACE) 1067mer mL oral liquid, 100 mg, Gastric (NG, OG, PEG, GT), 2x/day  famotidine (PEPCID) tablet, 10 mg, Oral, Daily  fentaNYL (SUBLIMAZE) 50 mcg/mL injection, 25 mcg, Intravenous, Once  heparin 5,000 unit/mL injection, 5,000 Units, Subcutaneous, Q8HRS  levalbuterol (XOPENEX) 0.63 mg/ 3 mL nebulizer solution, 0.63 mg, Nebulization, 3x/day   And  ipratropium (ATROVENT) 0.02% nebulizer solution, 0.5 mg, Nebulization, 3x/day  NS flush syringe, 2-6 mL, Intracatheter, Q8HRS   And  NS flush syringe, 2-6 mL, Intracatheter, Q1 MIN PRN  nystatin (MYCOSTATIN) 100,000 units/g topical cream, , Apply Topically, 2x/day  propofol (DIPRIVAN) 10 mg/mL premix infusion, 10 mcg/kg/min (Adjusted), Intravenous, Continuous  senna concentrate (SENNA) 528m94mr 15mL57ml liquid, 10 mL, Gastric (NG, OG, PEG, GT), 2x/day  SSIP insulin lispro 100 units/mL injection, 0-12 Units, Subcutaneous, Q4H PRN  Vancomycin IV - Pharmacist to Dose per Protocol, , Does not apply, Daily PRN  Vancomycin IV Intermittent Dosing, , Does not apply, Daily PRN        Current Antimicrobials:  Antibiotics (From admission, onward)    Start     Stop Route Frequency    02/05/21 0226  Vancomycin IV Intermittent Dosing         -- N/A DAILY PRN           Lines:  Patient Lines/Drains/Airways Status     Active Line / Dialysis Catheter / Dialysis Graft / Drain / Airway / Wound     Name Placement date Placement time Site Days    Peripheral IV Left Median Cubital  (antecubital fossa) 02/04/21  2000  -- 1    Peripheral IV Right Basilic  (medial side of arm) 02/04/21  2000  -- 1    Dialysis Catheter Trialysis Catheter 02/06/21  0000  -- less than 1    Oral  Gastric Tube 02/05/21  2300  -- less than 1    EndoTracheal Tube 7.5 --  --  -- --    EndoTracheal Tube Lip --  --  -- --    Wound (Non-Surgical) Anterior;Right Elbow 02/05/21  0045  -- 1                 Laboratory Studies:  CBC Differential   Recent Labs     02/05/21  0055 02/06/21  0131   WBC 11.3* 13.8*   HGB 8.5* 7.8*   HCT 25.7* 23.7*   PLTCNT 88* 75*   BANDS 2  --     Recent Labs     02/05/21  0055   PMNS 92   LYMPHOCYTES 2   MONOCYTES 4   EOSINOPHIL 0   BASOPHILS 0  <0.10   PMNABS 10.62*   MONOSABS 0.45   EOSABS <0.10      BMP LFTs   Recent Labs     02/05/21  1157 02/06/21  0130 02/06/21  0131 02/06/21  0132 02/06/21  0420   SODIUM 137 135*  --  133* 133*   POTASSIUM 3.6 4.4  --   --   --    CHLORIDE 103 101  --  101 104   CO2 21* 22  --   --   --    BUN 63* 69*  --   --   --    CREATININE 6.79* 6.86*  --   --   --    GLUCOSENF 117 87  --   --   --    ANIONGAP 13 12  --   --   --    BUNCRRATIO 9 10  --   --   --    GFR 8* 7*  --   --   --    CALCIUM 7.7* 7.5*  --   --   --    MAGNESIUM 2.5 2.6  --   --   --    PHOSPHORUS 7.4* 7.8*  --   --   --    ALBUMIN  --  1.3* 1.3*  --   --     Recent Labs     02/05/21  1934 02/06/21  0130 02/06/21  0131   TOTALPROTEIN  --    < > 5.9*   ALBUMIN  --    < > 1.3*   TOTBILIRUBIN  --    < > 0.9   BILIRUBINCON  --    < > 0.6*   AST  --    < >  23   ALT  --    < > 11   ALKPHOS  --    < > 173*   AMMONIA 55*  --   --     < > = values in this interval not displayed.      CoAgs Blood Gas:   No results found for this encounter Recent Labs     02/06/21  0132 02/06/21  0420   FI02 30.0 30   PH 7.40 7.40   PCO2 35* 33*   PO2 39 91   BICARBONATE 22.1 21.9   BASEDEFICIT 2.7 3.8*       Cardiac Markers Lipid Panel   No results for input(s): TROPONINI, CKMB, MBINDEX, BNP in the last 72 hours. Recent Labs     02/06/21  0130   TRIG 336*      Urine Analysis Other Labs   Recent Labs     02/06/21  0420   GLUCOSE 87    No results found for this encounter    Invalid input(s): PRL      Microbiology:  Hospital Encounter on 02/05/21 (from the past 96 hour(s))   BRONCHOALVEOLAR LAVAGE (BAL) QUANTITATIVE CULTURE/GRAM STAIN    Collection Time: 02/05/21 10:19 AM    Specimen: Bronchoalveolar Lavage; Other   Culture Result Status    GRAM STAIN 3+ Several PMNs Preliminary    GRAM STAIN No Organisms Seen Preliminary       Hospital Encounter on 02/05/21 (from the past 24 hour(s))   BRONCHOALVEOLAR LAVAGE (BAL) QUANTITATIVE CULTURE/GRAM STAIN    Collection Time: 02/05/21 10:19 AM    Specimen: Bronchoalveolar Lavage; Other   Culture Result Status    GRAM STAIN 3+ Several PMNs Preliminary    GRAM STAIN No Organisms Seen Preliminary       Imaging Studies:  Results for orders placed or performed during the hospital encounter of 02/05/21 (from the past 72 hour(s))   XR AP MOBILE CHEST     Status: None    Narrative    XR AP MOBILE CHEST performed on 02/05/2021 1:17 AM    INDICATION: 35 years old Female; Mechanical vent    TECHNIQUE: 1 views of the chest; 1 images    COMPARISON: None available    SUPPORT DEVICES: Endotracheal tube tip projects about 2 cm from the carina. Enteric tube tip projects below the diaphragm.    FINDINGS:  The heart is normal in size. There is no significant pleural effusion or evidence of pneumothorax. Few tiny nodular opacities with possible cavitation for example at left lung base, concerning for infectious/inflammatory process.      Impression    1.Few nodular opacities with possible cavitation, concerning for infectious/inflammatory process.  2.Endotracheal tube tip projects about 2 cm from the carina.   CT BRAIN WO IV CONTRAST     Status: None    Narrative    Azora Idler  Female, 35 years old.    CT BRAIN WO IV CONTRAST performed on 02/05/2021 11:28 AM.    REASON FOR EXAM:  altered mental status  RADIATION DOSE: 1220.40 mGycm    COMPARISON: None.    TECHNIQUE: Axial CT images were performed from the vertex through the skull base without administration of contrast. Sagittal and  coronal reformats were also submitted for review.    FINDINGS:    The ventricles are normal in configuration, size and symmetry. The gray-white interface is grossly preserved. There is no evidence of acute hemorrhage, abnormal fluid collection, mass effect or midline  shift. There is partial left mastoid effusion with fluid seen layering in the inferior most mastoid cells. The visualized paranasal sinuses and mastoid air cells are otherwise clear. There is no evidence of acute fracture or suspicious osseous lesion.      Impression    No evidence of acute intracranial process.   CT FACIAL BONES WO IV CONTRAST     Status: None    Narrative    Abril Geathers  Female, 35 years old.    CT FACIAL BONES WO IV CONTRAST performed on 02/05/2021 11:28 AM.    REASON FOR EXAM:  Preop    RADIATION DOSE: 614.50 mGycm    COMPARISON: Concurrent CT brain    TECHNIQUE: Axial CT images were obtained from the skull base through the hyoid without administration of intravenous contrast. Sagittal and coronal reformats were also submitted for review.    FINDINGS:    The tracheostomy and enteric tubes are partially visualized. The maxilla is edentulous. There is periapical lucency surrounding the right mandibular first premolar root fragments with anterior cortical breakthrough. Periapical lucency is also noted at the left first premolar.. There are multiple dental caries. No definitive loculated soft tissue fluid collection to suggest abscess within the limitations of noncontrast examination. There is a small right maxillary mucous retention cyst. The paranasal sinuses are otherwise clear.      Impression    Multiple mandibular dental caries with periapical lucencies surrounding the root fragments of the first premolars. Dental follow-up recommended.   CT CHEST ABDOMEN PELVIS WO IV CONTRAST     Status: None    Narrative    Vantasia Behrend  Female, 35 years old.    CT CHEST ABDOMEN PELVIS WO IV CONTRAST performed on 02/05/2021 11:29 AM.    REASON  FOR EXAM:  hypoxia    Patient has a history of IV drug use and presented on 01/31/2021 and outside hospital with drug overdose, aspiration pneumonia and endocarditis.    RADIATION DOSE: 817.00 mGycm    COMPARISON: None    FINDINGS:   CHEST:  LUNGS: Bilateral patchy airspace opacities are noted, some of which demonstrate cavitation and are consistent with septic emboli. Airspace opacity is noted at the lung bases.    HEART/MEDIASTINUM: Cardiac size is at the upper limits of normal. Trace pericardial effusion is noted. The thyroid is symmetric.    PLEURA: No effusion    LYMPH NODES: No adenopathy    BONES/SOFT TISSUES: Soft tissues are normal. Vertebral body height is maintained.    ABDOMEN/PELVIS:   LIVER: The liver is enlarged but normal in attenuation    BILIARY SYSTEM/GALLBLADDER: The gallbladder is distended. No bile duct dilation.    PANCREAS: Grossly normal    KIDNEY/URETERS: No hydronephrosis. No renal calculi.    ADRENALS: No adrenal mass    SPLEEN: Spleen is enlarged    BOWEL: No abnormally distended loops of bowel are identified to suggest obstruction. Moderate volume fecal retention is noted.    VASCULATURE/LYMPH NODES: No definite adenopathy. Right-sided femoral central line is noted.    BLADDER: Bladder is decompressed. Punctate focus of air is likely from previous catheter placement.    REPRODUCTIVE ORGANS: Not identified    PERITONEAL CAVITY: Trace ascites is noted in the pelvis.    BONES/SOFT TISSUES: There is mild anasarca. Vertebral body height is maintained.      Impression    1. Bilateral airspace opacities, some of which are cavitary likely representing septic emboli. More confluent airspace opacity is noted dependently  at both lung bases and may represent a component of aspiration pneumonia.  2. Hepatosplenomegaly.  3. Trace ascites in the pelvis and mild anasarca.     XR ABD X-RAY CHECK DOBHOFF PLACEMENT     Status: Abnormal    Narrative    Annalyce Soza  Female, 35 years old.    XR ABD X-RAY  CHECK DOBHOFF PLACEMENT performed on 02/05/2021 11:28 PM.    REASON FOR EXAM:  OG placement    TECHNIQUE: 1 views/2 images submitted for interpretation.    COMPARISON:  CT chest abdomen and pelvis 02/05/2021      Impression    Enteric tube across the diaphragm with tip overlying the distal body of the stomach. Side-port is 9 cm caudal to the diaphragmatic hiatus. The esophageal temperature probe also crosses the diaphragm with tip projecting over the upper sacrum. Subsequent radiograph demonstrates significant retraction of the esophageal temperature probe.   XR AP MOBILE CHEST     Status: None    Narrative    XR AP MOBILE CHEST performed on 02/06/2021 12:49 AM    INDICATION: 35 years old Female; cvc placement    TECHNIQUE: 1 views of the chest; 1 images    COMPARISON: X-ray chest AP 2020 2022    SUPPORT DEVICES: Interval placement of the right IJ central venous catheter, tip projecting over the right atrium. Endotracheal tube, enteric tube and esophageal temperature probe are in place.    FINDINGS:  Multifocal cavitary lesions in bilateral lung fields with small bilateral pleural effusion. Cardiac size is within normal limit without pulmonary edema. No pneumothorax.      Impression    Interval placement of the right IJ central venous catheter, tip projecting over right atrium. No pneumothorax.       Results for orders placed or performed during the hospital encounter of 02/05/21 (from the past 24 hour(s))   CT BRAIN WO IV CONTRAST     Status: None    Narrative    [place]  Female, 35 years old.    CT BRAIN WO IV CONTRAST performed on 02/05/2021 11:28 AM.    REASON FOR EXAM:  altered mental status  RADIATION DOSE: 1220.40 mGycm    COMPARISON: None.    TECHNIQUE: Axial CT images were performed from the vertex through the skull base without administration of contrast. Sagittal and coronal reformats were also submitted for review.    FINDINGS:    The ventricles are normal in configuration, size and symmetry. The  gray-white interface is grossly preserved. There is no evidence of acute hemorrhage, abnormal fluid collection, mass effect or midline shift. There is partial left mastoid effusion with fluid seen layering in the inferior most mastoid cells. The visualized paranasal sinuses and mastoid air cells are otherwise clear. There is no evidence of acute fracture or suspicious osseous lesion.      Impression    No evidence of acute intracranial process.   CT FACIAL BONES WO IV CONTRAST     Status: None    Narrative    Nicholette Calandro  Female, 35 years old.    CT FACIAL BONES WO IV CONTRAST performed on 02/05/2021 11:28 AM.    REASON FOR EXAM:  Preop    RADIATION DOSE: 614.50 mGycm    COMPARISON: Concurrent CT brain    TECHNIQUE: Axial CT images were obtained from the skull base through the hyoid without administration of intravenous contrast. Sagittal and coronal reformats were also submitted for review.    FINDINGS:  The tracheostomy and enteric tubes are partially visualized. The maxilla is edentulous. There is periapical lucency surrounding the right mandibular first premolar root fragments with anterior cortical breakthrough. Periapical lucency is also noted at the left first premolar.. There are multiple dental caries. No definitive loculated soft tissue fluid collection to suggest abscess within the limitations of noncontrast examination. There is a small right maxillary mucous retention cyst. The paranasal sinuses are otherwise clear.      Impression    Multiple mandibular dental caries with periapical lucencies surrounding the root fragments of the first premolars. Dental follow-up recommended.   CT CHEST ABDOMEN PELVIS WO IV CONTRAST     Status: None    Narrative    Leisel Orsini  Female, 35 years old.    CT CHEST ABDOMEN PELVIS WO IV CONTRAST performed on 02/05/2021 11:29 AM.    REASON FOR EXAM:  hypoxia    Patient has a history of IV drug use and presented on 01/31/2021 and outside hospital with drug overdose,  aspiration pneumonia and endocarditis.    RADIATION DOSE: 817.00 mGycm    COMPARISON: None    FINDINGS:   CHEST:  LUNGS: Bilateral patchy airspace opacities are noted, some of which demonstrate cavitation and are consistent with septic emboli. Airspace opacity is noted at the lung bases.    HEART/MEDIASTINUM: Cardiac size is at the upper limits of normal. Trace pericardial effusion is noted. The thyroid is symmetric.    PLEURA: No effusion    LYMPH NODES: No adenopathy    BONES/SOFT TISSUES: Soft tissues are normal. Vertebral body height is maintained.    ABDOMEN/PELVIS:   LIVER: The liver is enlarged but normal in attenuation    BILIARY SYSTEM/GALLBLADDER: The gallbladder is distended. No bile duct dilation.    PANCREAS: Grossly normal    KIDNEY/URETERS: No hydronephrosis. No renal calculi.    ADRENALS: No adrenal mass    SPLEEN: Spleen is enlarged    BOWEL: No abnormally distended loops of bowel are identified to suggest obstruction. Moderate volume fecal retention is noted.    VASCULATURE/LYMPH NODES: No definite adenopathy. Right-sided femoral central line is noted.    BLADDER: Bladder is decompressed. Punctate focus of air is likely from previous catheter placement.    REPRODUCTIVE ORGANS: Not identified    PERITONEAL CAVITY: Trace ascites is noted in the pelvis.    BONES/SOFT TISSUES: There is mild anasarca. Vertebral body height is maintained.      Impression    1. Bilateral airspace opacities, some of which are cavitary likely representing septic emboli. More confluent airspace opacity is noted dependently at both lung bases and may represent a component of aspiration pneumonia.  2. Hepatosplenomegaly.  3. Trace ascites in the pelvis and mild anasarca.     XR ABD X-RAY CHECK DOBHOFF PLACEMENT     Status: Abnormal    Narrative    Audreena Fugate  Female, 35 years old.    XR ABD X-RAY CHECK DOBHOFF PLACEMENT performed on 02/05/2021 11:28 PM.    REASON FOR EXAM:  OG placement    TECHNIQUE: 1 views/2 images  submitted for interpretation.    COMPARISON:  CT chest abdomen and pelvis 02/05/2021      Impression    Enteric tube across the diaphragm with tip overlying the distal body of the stomach. Side-port is 9 cm caudal to the diaphragmatic hiatus. The esophageal temperature probe also crosses the diaphragm with tip projecting over the upper sacrum. Subsequent radiograph demonstrates significant retraction of the esophageal  temperature probe.   XR AP MOBILE CHEST     Status: None    Narrative    XR AP MOBILE CHEST performed on 02/06/2021 12:49 AM    INDICATION: 35 years old Female; cvc placement    TECHNIQUE: 1 views of the chest; 1 images    COMPARISON: X-ray chest AP 2020 2022    SUPPORT DEVICES: Interval placement of the right IJ central venous catheter, tip projecting over the right atrium. Endotracheal tube, enteric tube and esophageal temperature probe are in place.    FINDINGS:  Multifocal cavitary lesions in bilateral lung fields with small bilateral pleural effusion. Cardiac size is within normal limit without pulmonary edema. No pneumothorax.      Impression    Interval placement of the right IJ central venous catheter, tip projecting over right atrium. No pneumothorax.     Impression:  Camiya Vinal is a 35 y.o., White female with a past medical history of IVDU, frequent kidney stones with concern for recurrent UTIs vs pyelo, and hep C (unsure of viral load).  Patient was hospitalized at [facility] 8/18 after being found down with suspected overdose.  She required intubation due to bradypnea and lethargy as well as question for aspiration pneumonia.  Blood cultures have been positive for MRSA from 08/18 through 8/21 (this isolate was susceptible to vancomycin at outside facility). Additionally, a respiratory culture was positive for MRSA.  Patient had TTE at outside facility that suggested tricuspid regurgitation and large tricuspid vegetation.  Patient had severe anuric AKI and has required HD. Patient  transferred to [facility] for cardiac surgery evaluation.    Upon admission to [facility] 8/22, patient is intubated on minimal vent settings.  She has recently been on HD due to severe anuric AKI.  She has been receiving cefepime and vancomycin, which has been deescalated to vancomycin monotherapy due to suspicion that this is all monomicrobial process with MRSA.  Repeat TTE 8/22 shows 1.4 x 0.8 cm tricuspid valve vegetation with mild tricuspid valve regurgitation.  Additionally, lung imaging suggests multiple lung nodularities and possible cavitation, which is suggestive of bilateral septic pulmonary emboli.    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  Please repeat blood cultures Q 48 hours until negative.  Patient may need line holiday.  Agree with continuing vancomycin monotherapy pharmacy to dose protocol.  Please keep in mind patient's HD requirements.  We will ask Micro to run ceftaroline susceptibilities on our blood cultures.    Please continue to follow CBC with differential, BUN, creatinine, hepatic function panel, and CRP while the patient remains on antimicrobial therapy.    Thank you for this consultation.  We will continue to follow.  Please call or page the Infectious Diseases Service with any questions regarding this patient.    Reatha Harps, MD  02/06/2021, 13:25      02/06/2021    I saw and examined the patient.  I reviewed the fellow's note.  I agree with the findings and plan of care as documented in the fellow's note.  Any exceptions/additions are edited/noted.     On the day of the encounter, a total of 15 minutes was spent in direct/indirect care of this patient including evaluation, review and interpretation of laboratory and microbiology studies, review of radiology studies and the medical record, order entry and coordination of care, counseling and educating the patient/family/caregiver, and  documentation.    Marlene Lard, M.D.  Assistant Professor  Section of Infectious Diseases  [employer]  02/06/2021, 13:34

## 2021-02-06 NOTE — Ancillary Notes (Signed)
Chart reviewed.  Patient remains intubated/mechanically ventilated and is pending possible surgical intervention. Will continue to monitor patient for changes and provide CR services when appropriate.    Maile Linford A. Elvis Coil, [place], EP-C  Cardiac Rehabilitation  [phone]

## 2021-02-06 NOTE — Pharmacy (Signed)
[place] / [facility]  Therapeutic Drug Monitoring: Vancomycin  02/06/2021      Patient name: [name] [name], [name] [name]  Date of Birth:  June 18, 1985    Actual Weight:  Weight: 72.4 kg (159 lb 9.8 oz) (02/05/21 0100)     BMI:  BMI (Calculated): 26.62 (02/05/21 0100)      Date RPh Current regimen (including mg/kg) Indication &  Organism AUC or trough based dosing Target Levels^ SCr (mg/dL) CrCl* (mL/min) Infectious Laboratory Markers (as applicable)   Measured level(s)   (mcg/mL) Calculated AUC (if AUC based monitoring) Plan & predicted AUC/trough if initial dosing (including when levels are due) Comments   8/22 [facility] intermittent dosing at outside hospital with dialysis endocarditis trough 13-17 6.21 12 WBC:11.3  [place]cal:  CRP: Vanc random 24.4  No Vanc at this time as the level is 24.4 - will proceed with intermittent dosing- dialysis plan unclear at this time - will clarify in the morning to determine further dosing/levels    8/23 TWS Intermittent Dosing As above As above 13-17 CVVHD CVVHD    - Start vancomycin 1000 mg IV q24h while patient is on CRRT  - If CRRT is stopped, discontinue scheduled dose and dose by levels                                                                                     Katherina Mires levels depends on dosing and monitoring method, AUC vs. trough based. For AUC based dosing units are mg*h/L. For trough based dosing units are mcg/mL.     *Creatinine clearance is estimated by using the Cockcroft-Gault equation for adult patients and the Carol Ada for pediatric patients.    The decision to discontinue vancomycin therapy will be determined by the primary service.  Please contact the pharmacist with any questions regarding this patient's medication regimen.

## 2021-02-06 NOTE — Procedures (Signed)
Trialysis Catheter Placement      Procedure Date:  02/06/2021  Time:  0100  Procedure: right [facility] Dialysis Catheter Placement  Diagnosis:  ARF  Indication:  Need for Hemodialysis    Description:  After informed consent the patient was positioned. A surgical time out was performed to confirm correct patient and procedure.  The site was identified and prepped in the usual sterile fashion. An appropriate site was identified using ultrasound. The skin was prepped with chlorhexidine.  A needle was advanced into the vessel and dark, non-pulsatile blood was aspirated. The right IJ vein was cannulated A guidewire was then inserted through the needle and the needle removed. A small skin nick was made with a scalpel along the guidewire. Two sequential dilators were advanced over the guidewire. The dialysis catheter was then advanced over the guidewire and the guidewire was removed. All ports drew blood back and flushed easily. The catheter was sutured and dressed in sterile fashion.  Patient tolerated procedure without complications. A CXR was ordered for confirmation of line placement.       Rema Fendt, APRN,AGACNP-BC 02/06/2021, 01:40

## 2021-02-07 ENCOUNTER — Inpatient Hospital Stay (HOSPITAL_COMMUNITY): Payer: Medicaid Other

## 2021-02-07 ENCOUNTER — Encounter (HOSPITAL_COMMUNITY): Payer: Self-pay

## 2021-02-07 ENCOUNTER — Encounter (HOSPITAL_COMMUNITY): Payer: Self-pay | Admitting: THORACIC SURGERY CARDIOTHORACIC VASCULAR SURGERY

## 2021-02-07 DIAGNOSIS — R1310 Dysphagia, unspecified: Secondary | ICD-10-CM

## 2021-02-07 DIAGNOSIS — G9340 Encephalopathy, unspecified: Secondary | ICD-10-CM

## 2021-02-07 DIAGNOSIS — E8770 Fluid overload, unspecified: Secondary | ICD-10-CM

## 2021-02-07 DIAGNOSIS — R601 Generalized edema: Secondary | ICD-10-CM

## 2021-02-07 DIAGNOSIS — K5900 Constipation, unspecified: Secondary | ICD-10-CM

## 2021-02-07 DIAGNOSIS — R7881 Bacteremia: Secondary | ICD-10-CM

## 2021-02-07 DIAGNOSIS — R160 Hepatomegaly, not elsewhere classified: Secondary | ICD-10-CM

## 2021-02-07 DIAGNOSIS — J9690 Respiratory failure, unspecified, unspecified whether with hypoxia or hypercapnia: Secondary | ICD-10-CM

## 2021-02-07 DIAGNOSIS — J9600 Acute respiratory failure, unspecified whether with hypoxia or hypercapnia: Secondary | ICD-10-CM

## 2021-02-07 DIAGNOSIS — K828 Other specified diseases of gallbladder: Secondary | ICD-10-CM

## 2021-02-07 DIAGNOSIS — R918 Other nonspecific abnormal finding of lung field: Secondary | ICD-10-CM

## 2021-02-07 LAB — PHOSPHORUS
PHOSPHORUS: 5.6 mg/dL — ABNORMAL HIGH (ref 2.4–4.7)
PHOSPHORUS: 3.7 mg/dL (ref 2.4–4.7)

## 2021-02-07 LAB — CBC WITH DIFF
HCT: 24.7 % — ABNORMAL LOW (ref 34.8–46.0)
HGB: 8.4 g/dL — ABNORMAL LOW (ref 11.5–16.0)
MCH: 25.2 pg — ABNORMAL LOW (ref 26.0–32.0)
MCHC: 34 g/dL (ref 31.0–35.5)
MCV: 74.2 fL — ABNORMAL LOW (ref 78.0–100.0)
MPV: 10.1 fL (ref 8.7–12.5)
PLATELETS: 130 10*3/uL — ABNORMAL LOW (ref 150–400)
RBC: 3.33 10*6/uL — ABNORMAL LOW (ref 3.85–5.22)
RDW-CV: 21.5 % — ABNORMAL HIGH (ref 11.5–15.5)
WBC: 17.4 10*3/uL — ABNORMAL HIGH (ref 3.7–11.0)

## 2021-02-07 LAB — BASIC METABOLIC PANEL
ANION GAP: 9 mmol/L (ref 4–13)
ANION GAP: 7 mmol/L (ref 4–13)
BUN/CREA RATIO: 11 (ref 6–22)
BUN/CREA RATIO: 11 (ref 6–22)
BUN: 62 mg/dL — ABNORMAL HIGH (ref 8–25)
BUN: 46 mg/dL — ABNORMAL HIGH (ref 8–25)
CALCIUM: 7.6 mg/dL — ABNORMAL LOW (ref 8.5–10.0)
CALCIUM: 7.7 mg/dL — ABNORMAL LOW (ref 8.5–10.0)
CHLORIDE: 103 mmol/L (ref 96–111)
CHLORIDE: 102 mmol/L (ref 96–111)
CO2 TOTAL: 22 mmol/L (ref 22–30)
CO2 TOTAL: 24 mmol/L (ref 22–30)
CREATININE: 5.45 mg/dL — ABNORMAL HIGH (ref 0.60–1.05)
CREATININE: 4.05 mg/dL — ABNORMAL HIGH (ref 0.60–1.05)
ESTIMATED GFR: 10 mL/min/BSA — ABNORMAL LOW (ref >=60)
ESTIMATED GFR: 14 mL/min/BSA — ABNORMAL LOW (ref >=60)
GLUCOSE: 107 mg/dL (ref 65–125)
GLUCOSE: 127 mg/dL — ABNORMAL HIGH (ref 65–125)
POTASSIUM: 4.4 mmol/L (ref 3.5–5.1)
POTASSIUM: 4.5 mmol/L (ref 3.5–5.1)
SODIUM: 134 mmol/L — ABNORMAL LOW (ref 136–145)
SODIUM: 133 mmol/L — ABNORMAL LOW (ref 136–145)

## 2021-02-07 LAB — POC BLOOD GLUCOSE (RESULTS)
GLUCOSE, POC: 101 mg/dl (ref 70–105)
GLUCOSE, POC: 104 mg/dl (ref 70–105)
GLUCOSE, POC: 108 mg/dl — ABNORMAL HIGH (ref 70–105)
GLUCOSE, POC: 110 mg/dl — ABNORMAL HIGH (ref 70–105)
GLUCOSE, POC: 118 mg/dl — ABNORMAL HIGH (ref 70–105)
GLUCOSE, POC: 125 mg/dl — ABNORMAL HIGH (ref 70–105)

## 2021-02-07 LAB — BPAM PACKED CELL ORDER

## 2021-02-07 LAB — MANUAL DIFF AND MORPHOLOGY-SYSMEX
BASOPHIL #: <0.1 10*3/uL (ref <=0.20)
BASOPHIL %: 0 %
EOSINOPHIL #: <0.1 10*3/uL (ref <=0.50)
EOSINOPHIL %: 0 %
LYMPHOCYTE #: 0.52 10*3/uL — ABNORMAL LOW (ref 1.00–4.80)
LYMPHOCYTE %: 3 %
METAMYELOCYTE %: 1 %
MONOCYTE #: 0.52 10*3/uL (ref 0.20–1.10)
MONOCYTE %: 3 %
NEUTROPHIL #: 16.36 10*3/uL — ABNORMAL HIGH (ref 1.50–7.70)
NEUTROPHIL %: 92 %
NEUTROPHIL BANDS %: 2 %

## 2021-02-07 LAB — CBC
HCT: 23.4 % — ABNORMAL LOW (ref 34.8–46.0)
HGB: 7.9 g/dL — ABNORMAL LOW (ref 11.5–16.0)
MCH: 24.7 pg — ABNORMAL LOW (ref 26.0–32.0)
MCHC: 33.8 g/dL (ref 31.0–35.5)
MCV: 73.1 fL — ABNORMAL LOW (ref 78.0–100.0)
MPV: 10.2 fL (ref 8.7–12.5)
PLATELETS: 99 10*3/uL — ABNORMAL LOW (ref 150–400)
RBC: 3.2 10*6/uL — ABNORMAL LOW (ref 3.85–5.22)
RDW-CV: 21.5 % — ABNORMAL HIGH (ref 11.5–15.5)
WBC: 15.1 10*3/uL — ABNORMAL HIGH (ref 3.7–11.0)

## 2021-02-07 LAB — MAGNESIUM: MAGNESIUM: 2.5 mg/dL (ref 1.8–2.6)

## 2021-02-07 LAB — VENOUS BLOOD GAS WITH LACTATE REFLEX
%FIO2 (VENOUS): 30 %
BASE EXCESS: 0.3 mmol/L (ref <=3.0)
BICARBONATE (VENOUS): 24.3 mmol/L (ref 22.0–26.0)
LACTATE: 0.8 mmol/L (ref 0.0–1.3)
PCO2 (VENOUS): 38 mm/Hg — ABNORMAL LOW (ref 41–51)
PH (VENOUS): 7.42 — ABNORMAL HIGH (ref 7.31–7.41)
PO2 (VENOUS): 34 mm/Hg — ABNORMAL LOW (ref 35–50)

## 2021-02-07 LAB — HEPATIC FUNCTION PANEL
ALBUMIN: 1.3 g/dL — ABNORMAL LOW (ref 3.5–5.0)
ALKALINE PHOSPHATASE: 200 U/L — ABNORMAL HIGH (ref 40–110)
ALT (SGPT): 11 U/L (ref 8–22)
AST (SGOT): 29 U/L (ref 8–45)
BILIRUBIN DIRECT: 0.8 mg/dL — ABNORMAL HIGH (ref 0.1–0.4)
BILIRUBIN TOTAL: 1.2 mg/dL (ref 0.3–1.3)
PROTEIN TOTAL: 6.4 g/dL (ref 6.4–8.3)

## 2021-02-07 LAB — BRONCHOALVEOLAR LAVAGE (BAL) QUANTITATIVE CULTURE/GRAM STAIN: GRAM STAIN: NONE SEEN

## 2021-02-07 LAB — ADULT ROUTINE BLOOD CULTURE, SET OF 2 BOTTLES (BACTERIA AND YEAST)

## 2021-02-07 LAB — PATH COMMENT: PATHOLOGIST INTERPRETATION: ABNORMAL — AB

## 2021-02-07 MED ORDER — KETAMINE 10 MG/ML INJECTION WRAPPER
INTRAMUSCULAR | Status: AC
Start: 2021-02-07 — End: 2021-02-07
  Administered 2021-02-07: 50 mg via INTRAVENOUS
  Filled 2021-02-07: qty 5

## 2021-02-07 MED ORDER — VITAMIN B COMPLEX AND C NO.10-FOLIC ACID 900 MCG/5 ML ORAL LIQUID
5.0000 mL | Freq: Every day | ORAL | Status: DC
Start: 2021-02-07 — End: 2021-02-08
  Administered 2021-02-07 – 2021-02-08 (×3): 900 ug via GASTROSTOMY
  Filled 2021-02-07 (×3): qty 5

## 2021-02-07 MED ORDER — SODIUM CHLORIDE 0.9 % IV BOLUS
40.0000 mL | INJECTION | Freq: Once | Status: AC | PRN
Start: 2021-02-07 — End: 2021-02-07

## 2021-02-07 MED ORDER — SODIUM CHLORIDE 3 % FOR NEBULIZATION
3.0000 mL | INHALATION_SOLUTION | Freq: Two times a day (BID) | RESPIRATORY_TRACT | Status: DC
Start: 2021-02-07 — End: 2021-02-09
  Administered 2021-02-07 – 2021-02-09 (×4): 3 mL via RESPIRATORY_TRACT
  Filled 2021-02-07 (×5): qty 4

## 2021-02-07 MED ORDER — HYDROMORPHONE 1 MG/ML INJECTION WRAPPER
0.2000 mg | INJECTION | INTRAMUSCULAR | Status: AC
Start: 2021-02-07 — End: 2021-02-07
  Administered 2021-02-07: 0.2 mg via INTRAVENOUS
  Filled 2021-02-07: qty 1

## 2021-02-07 MED ORDER — PRISMASATE (BICARBONATE-BASED) 4K DIALYSATE
ARTERIOVENOUS_FISTULA | Status: AC
Start: 2021-02-07 — End: 2021-02-08

## 2021-02-07 MED ORDER — KETAMINE 10 MG/ML INJECTION WRAPPER
50.0000 mg | INTRAMUSCULAR | Status: AC
Start: 2021-02-07 — End: 2021-02-07

## 2021-02-07 MED ORDER — POLYETHYLENE GLYCOL 3350 17 GRAM ORAL POWDER PACKET
17.0000 g | Freq: Every day | ORAL | Status: DC
Start: 2021-02-07 — End: 2021-02-08
  Administered 2021-02-07 – 2021-02-08 (×2): 17 g via GASTROSTOMY
  Filled 2021-02-07 (×2): qty 1

## 2021-02-07 MED ORDER — NUTRITION PROTEIN SUPPLEMENT - TUBE FEED
2.0000 | Freq: Every day | Status: DC
Start: 2021-02-07 — End: 2021-02-08
  Administered 2021-02-07 – 2021-02-08 (×2): 30 g via GASTROSTOMY

## 2021-02-07 NOTE — Care Plan (Signed)
Ordered    02/06/21 1300  VENTILATOR - CPAP(PS) / SPONTANEOUS  CONTINUOUS     Discontinue     Duration: Until Specified    Priority: Routine          Question Answer Comment   FIO2 (%) 30    Peep(cm/H2O) 5    Pressure Support(cm/H2O) 8    Indications IMPROVE DISTRIBUTION OF VENTILATION    Invasive/Non-Invasive INVASIVE

## 2021-02-07 NOTE — Procedures (Signed)
CRRT NOTE  Indication: Acute Kidney Injury Date Started: 02/06/21 Access: Non-Tunneled line  Location: right internal jugular   Modality: CVVHD Anticoagulation: none   Replacement: n/a   Pre-filter rate:n/a  Post Filter Rate: n/a Blood Flow: 250 mL/min   Dialysis: 4K    Rate: 2000 L/hr Electrolytes:   Lab Results   Component Value Date    SODIUM 133 (L) 02/07/2021    POTASSIUM 4.5 02/07/2021    CHLORIDE 102 02/07/2021    CO2 24 02/07/2021    ANIONGAP 7 02/07/2021    BUN 46 (H) 02/07/2021    CREATININE 4.05 (H) 02/07/2021    BUNCRRATIO 11 02/07/2021    GFR 14 (L) 02/07/2021    GLUCOSENF 127 (H) 02/07/2021     Lab Results   Component Value Date    CALCIUM 7.7 (L) 02/07/2021     No components found for: [facility]  Lab Results   Component Value Date    PHOSPHORUS 5.6 (H) 02/07/2021        Recent Events: 35 yr old female with 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 Pt with AKI 2/2 TV endocarditis, sepsis, IVDU. Pt received IHD x 2 at outside hospital. Remained anuric after transfer to [facility] CVICU and was not following commands. CT Brain did not show any abnormality to account for pt's altered mental status. BP was on lower end of normal 02/06/21 so CRRT initiated instead of iHD. Pt seen on CRRT with UF- tolerating well.                         Plan: increase dialysate to 2573m/hr, increase UF as tolerated to achieve euvolemia. Continue CRRT, new order placed.

## 2021-02-07 NOTE — Care Plan (Signed)
Pt intubated on CRRT s/p OD and AMS. Pt can be agitated and fight the ventilator, however does not follow commands. MRI brain completed overnight. ETT exchange due to tube malfunction, tolerated well. Possible SCT workup if neuro status improves. 1 U PRBCs overnight. Safety precautions in place, hourly rounds completed. Alarms active and audible and call bell within reach. Plan of care reviewed with HCS. Will continue to monitor.    Problem: Adult Inpatient Plan of Care  Goal: Plan of Care Review  Outcome: Ongoing (see interventions/notes)  Goal: Patient-Specific Goal (Individualized)  Outcome: Ongoing (see interventions/notes)  Goal: Absence of Hospital-Acquired Illness or Injury  Outcome: Ongoing (see interventions/notes)  Intervention: Identify and Manage Fall Risk  Recent Flowsheet Documentation  Taken 02/06/2021 2000 by Francia Greaves, RN  Safety Promotion/Fall Prevention:   activity supervised   fall prevention program maintained   motion sensor pad activated   nonskid shoes/slippers when out of bed   safety round/check completed  Intervention: Prevent Skin Injury  Recent Flowsheet Documentation  Taken 02/06/2021 2000 by Francia Greaves, RN  Body Position: supine, head elevated  Intervention: Prevent and Manage VTE (Venous Thromboembolism) Risk  Recent Flowsheet Documentation  Taken 02/06/2021 2000 by Francia Greaves, RN  VTE Prevention/Management: anticoagulant therapy maintained  Goal: Optimal Comfort and Wellbeing  Outcome: Ongoing (see interventions/notes)  Intervention: Provide Person-Centered Care  Recent Flowsheet Documentation  Taken 02/06/2021 2000 by Francia Greaves, RN  Trust Relationship/Rapport:   care explained   choices provided   emotional support provided   empathic listening provided   questions answered   questions encouraged   reassurance provided   thoughts/feelings acknowledged  Goal: Rounds/Family Conference  Outcome: Ongoing (see interventions/notes)     Problem: Skin Injury Risk  Increased  Goal: Skin Health and Integrity  Outcome: Ongoing (see interventions/notes)  Intervention: Optimize Skin Protection  Recent Flowsheet Documentation  Taken 02/06/2021 2000 by Francia Greaves, RN  Pressure Reduction Techniques: (L,M,H,VH)Frequent Turning  Pressure Reduction Devices: (L.M,H,VH) Heel offloading device utilized  Skin Protection: adhesive use limited  Head of Bed (HOB) Positioning: HOB at 30-45 degrees     Problem: Non-violent/Non-Self Destructive Restraints  Goal: Alternative methods tried prior to restraints  Outcome: Ongoing (see interventions/notes)  Goal: Patient free from injury and discomfort  Outcome: Ongoing (see interventions/notes)  Goal: Autonomy maintained at the highest possible level  Outcome: Ongoing (see interventions/notes)  Goal: Need for restraints reassessed per policy  Outcome: Ongoing (see interventions/notes)  Goal: Patient education provided  Outcome: Ongoing (see interventions/notes)  Goal: Problem Interventions  Outcome: Ongoing (see interventions/notes)     Problem: Communication Impairment (Mechanical Ventilation, Invasive)  Goal: Effective Communication  Outcome: Ongoing (see interventions/notes)     Problem: Device-Related Complication Risk (Mechanical Ventilation, Invasive)  Goal: Optimal Device Function  Outcome: Ongoing (see interventions/notes)     Problem: Inability to Wean (Mechanical Ventilation, Invasive)  Goal: Mechanical Ventilation Liberation  Outcome: Ongoing (see interventions/notes)  Intervention: Promote Extubation and Mechanical Ventilation Liberation  Recent Flowsheet Documentation  Taken 02/06/2021 2000 by Francia Greaves, RN  Sleep/Rest Enhancement: awakenings minimized     Problem: Nutrition Impairment (Mechanical Ventilation, Invasive)  Goal: Optimal Nutrition Delivery  Outcome: Ongoing (see interventions/notes)     Problem: Skin and Tissue Injury (Mechanical Ventilation, Invasive)  Goal: Absence of Device-Related Skin and Tissue  Injury  Outcome: Ongoing (see interventions/notes)     Problem: Ventilator-Induced Lung Injury (Mechanical Ventilation, Invasive)  Goal: Absence of Ventilator-Induced Lung Injury  Outcome: Ongoing (see interventions/notes)  Intervention: Prevent Ventilator-Associated Pneumonia  Recent Flowsheet Documentation  Taken 02/06/2021 2000 by Francia Greaves, RN  Head of Bed [facility]) Positioning: HOB at 30-45 degrees     Problem: Infection Progression (Sepsis)  Goal: Absence of Infection Signs and Symptoms  Outcome: Ongoing (see interventions/notes)  Intervention: Promote Stabilization  Recent Flowsheet Documentation  Taken 02/06/2021 2000 by Francia Greaves, RN  Fever Reduction/Comfort Measures:   lightweight bedding   lightweight clothing  Intervention: Promote Recovery  Recent Flowsheet Documentation  Taken 02/06/2021 2000 by Francia Greaves, RN  Sleep/Rest Enhancement: awakenings minimized  Activity Management: activity adjusted per tolerance     Problem: Fall Injury Risk  Goal: Absence of Fall and Fall-Related Injury  Outcome: Ongoing (see interventions/notes)  Intervention: Identify and Manage Contributors  Recent Flowsheet Documentation  Taken 02/06/2021 2000 by Francia Greaves, RN  Self-Care Promotion: independence encouraged  Intervention: Promote Injury-Free Environment  Recent Flowsheet Documentation  Taken 02/06/2021 2000 by Francia Greaves, RN  Safety Promotion/Fall Prevention:   activity supervised   fall prevention program maintained   motion sensor pad activated   nonskid shoes/slippers when out of bed   safety round/check completed

## 2021-02-07 NOTE — Progress Notes (Signed)
[facility]  HVI Critical Care Consult/Progress Note    [name] [name], [name] [name], 35 y.o. female  Date of Birth: 1985-12-18  Medical Record Number:  [id number]   Inpatient Admission Date: 02/05/2021   Hospital Day:  LOS: 0 days     Chief Complaint: Altered Mental Status   History of Present Illness: Per previous providers: "Pt is a 35 yo female with a significant PMH for Smoking and IVDU presented to [name] with AMS on 01/31/21. She was found to have a drug overdose, aspiration PNA requiring intubation, MRSA bacteremia, large TV endocarditis with moderate TR and elevated systemic pulmonary pressures (35mhg), AKI requiring HD, anemia requiring PRBC and thrombocytopenia. Urine drug screen + for heroin and methamphetamines.    [facility]   [facility]: [facility]  [facility]: See above.   -Prop for sedation. Agitated; MAE; does not follow. Repeat CT Head/CAP. Hemodynamics stable. Intubated. Repeated blood cultures. Continue Vanc.   8/23: had pan CT 8/22, still not awakening/folowing commands on precedex  But agitated. MRI brain ordered, started on CRRT  8/24: remains on CRRT, MRI Brain no abnormality, following commands on dexmedetomidine, quetiapine, prn dilaudid   Past Medical History: No past medical history on file.    Past Surgical History: No past surgical history on file.   Social History:  Social History           Socioeconomic History   . Marital status: Divorced     Spouse name: Not on file   . Number of children: Not on file   . Years of education: Not on file   . Highest education level: Not on file   Occupational History   . Not on file   Tobacco Use   . Smoking status: Not on file   . Smokeless tobacco: Not on file   Substance and Sexual Activity   . Alcohol use: Not on file   . Drug use: Not on file   . Sexual activity: Not on file    Other Topics Concern   . Not on file   Social History Narrative   . Not on file     Social Determinants of Health     Financial Resource Strain: Not on file   Food Insecurity: Not on file   Transportation Needs: Not on file   Physical Activity: Not on file   Stress: Not on file   Intimate Partner Violence: Not on file   Housing Stability: Not on file      Family History: Family Medical History:       None             Allergies: No Known Allergies   ROS:  Unobtainable because intubated/sedated      Problem List:       Patient Active Problem List   Diagnosis   . Endocarditis       Medications:  Scheduled Meds: chlorhexidine gluconate, 15 mL, 2x/day  docusate sodium, 100 mg, 2x/day  famotidine, 10 mg, Daily  NS flush, 2-6 mL, Q8HRS  perflutrn lipid microspheres (DEFINITY) DILUTION injection, 2 mL, Give in Cardiology  propofoL, ,   senna concentrate, 10 mL, 2x/day      IV Infusions: propofoL, Last Rate: 20 mcg/kg/min (02/05/21 0220)      PRN:  NS flush, 2-6 mL, Q1 MIN PRN  insulin lispro, 0-12 Units, Q4H  PRN  Pharmacy to Dose Vancomycin, , Daily PRN  Vancomycin IV Intermittent Dosing, , Daily PRN        Last VS:    Heart Rate: (!) 126  Respiratory Rate: (!) 23  BP (Non-Invasive): 130/84    Physical Exam  Vitals and nursing note reviewed.   Constitutional:       General: She is in acute distress.      Appearance: She is ill-appearing.      Comments: Critically ill appearing female, no acute distress, becomes restless at times, wants ETT out, following commands   HENT:      Head: Normocephalic and atraumatic.      Nose: Nose normal.      Mouth/Throat:      Mouth: Mucous membranes are moist.   Eyes:      General: No scleral icterus.        Right eye: No discharge.         Left eye: No discharge.      Extraocular Movements: Extraocular movements intact.      Conjunctiva/sclera: Conjunctivae normal.      Pupils: Pupils are equal, round, and reactive to light.   Cardiovascular:      Rate and Rhythm:  Tachycardia present.      Pulses: Normal pulses.      Heart sounds: Murmur heard.   Pulmonary:      Effort: Pulmonary effort is normal. No respiratory distress.      Breath sounds: No stridor. No wheezing, rhonchi or rales.      Comments: On mechanical vent  Abdominal:      General: Abdomen is flat. Bowel sounds are normal. There is no distension.      Palpations: Abdomen is soft. There is no mass.      Hernia: No hernia is present.      Comments: Unable to assess for tenderness/guarding as pt with altered mental status   Musculoskeletal:         General: Swelling present. Normal range of motion.      Cervical back: Normal range of motion and neck supple. No rigidity.      Right lower leg: Edema present.      Left lower leg: Edema present.   Skin:     General: Skin is warm and dry.      Capillary Refill: Capillary refill takes less than 2 seconds.      Findings: No bruising, lesion or rash.   Neurological:      General: No focal deficit present.      Comments: Follows commands, shakes head yes/no appropriately   Psychiatric:      Comments: Unable to assess 2/2 pt intubated and sedated           [x] ?I have reviewed the patient's vitals signs, the nursing notes, the physician's notes and other progress notes     [x] ?I have reviewed the laboratory and imaging data    [x] ?I have reviewed the records from [place]     [x] ?I have discussed this case with CCM team      Systems Based Assessment and Plan:    Neurologic:  Data/Assessment Reviewed    Diagnosis Altered mental status 2/2 encephalopathy   Overdose 2/2 IVDU  IVD withdrawal   Course: Critical   Plan Continue dexmedetomidine and titrate to rass 0 to -1  Continue quetiapine 50 mg bid  Weaned off propofol  PRN pain medication: dilaudid 0.50m ivp prn  MRI Brain 8/23 negative  Day/night policy, reorientation as needed  PT/OT  Serial neuro checks       Cardiovascular:  Data/Assessment Most Recent Hemodynamics: Reviewed   Cardiovascular support: NA      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  Moderate TR  Mild Pulmonary HTN   Course Critical   Plan Endocarditis:   -Repeat TTE done and reviewed  -Continue Vanc  -Repeat Blood cultures done 8/22  SCT evaluating pt for surgery- will inform them re pt's improving neuro function, continue to optimize pt medically  MAP goal  >65     Respiratory:  Data/Assessment: CXR: Reviewed   Most recent VBG: Reviewed   Vent Settings: Reviewed      Diagnosis Acute respiratory failure 2/2 Aspiration PNA and altered mental status  Septic pulmonary emboli  Smoker     Course Critical   Plan Wean vent as tolerated-continue pressure support ventilation  Volume removal on CRRT to achieve euvolemia then assess for extubation  FIO2 wean as tolerated for O2 sats>92%  Continue vent support for airway protection   Pulm toilet; scheduled nebs         Renal:  Data/Assessment: Reviewed    Diagnosis Acute kidney injury - multifactorial: septic shock 2/2 MRSA TV endocarditis, IVDU  Hypervolemia and anasarca   Course critical   Plan Continue CRRT which was initiated 02/06/21- increase UF as tolerated to achieve euvolemia  Monitor electrolytes, clearance as needed       Gastrointestinal:  Data/Assessment: Famotidine (Pepcid)  for stress ulcer prophylaxis   Diet: tube feeds   Bowel Regimen:  Colace and Senna    Diagnosis Dysphagia  Enlarged liver  Distended gallbladder with mild elevation in Alk phos and bilirubin  constipation   Course  Severe   Plan Tube feeds - titrate to goal rate - will d/w dietitian re any further recs  Can change nepro to osmolite 1.5 and add prosource as pt in on CRRT  Continue bowel regimen: colace, miralax  Continue stress ulcer prophylaxis   Monitor lfts and abdominal exam - if increasing bilirubin/alk phos and/or abdominal tenderness will obtain gallbladder ultrasound and abdominal duplex     Endocrine:  Data/Assessment Last three Fingerstick glucose: Reviewed   Last HbA1C: NA  Most recent Thyroid results: 2.3      Diagnosis No active diagnosis   Course Stable   Plan Regular SSI every 6 hours  Goal 140-180       Hematologic:  Data/Assessment:  Reviewed    Diagnosis Anemia of chronic disease and Thrombocytopenia   Course Stable   Plan SCDs (Sequential Compression Device)  For DVT Prophylaxis  Sub q heparin  Continue to closely monitor cbc/coags      Infectious Diseases:  Data/Assessment Tmax last 24 hrs: No data recorded.       Diagnosis MRSA TV Endocarditis   Course Critical   Plan Antibiotic therapy  Agent:  Vancomycin  Indication: MRSA     repeat cultures; - follow up results    Will continue to monitor for additional s/s of infection     New non tunneled HD catheter placed 8/22 in right IJ  Femoral HD catheter removed       Family Communication and Disposition:  Data/Assessment I discussed pt's condition and plan of care in detail with pt's mother at bedside during rounds.         HVI Critical Care Attending  I have seen and examined the patient and discussed the plan of care on rounds with the  CVICU team at the bedside. The patient remains critically ill and at high risk of acute clinical decompensation and/or death due to the clinical conditions and diagnoses listed in this note. I have spent a total of 38 minutes of critical care time evaluating and treating this complex critically ill patient. My time is independent of other providers and does not include any time spent on procedures or teaching during rounds.  Ernest Haber, MD

## 2021-02-07 NOTE — Ancillary Notes (Signed)
Chart reviewed.  Patient remains mechanically ventilated and now on CRRT. She is awaiting surgical evaluation from SCT.   Cardiac rehab will sign off on patient at this time. Please reorder services if surgical intervention occurs.   Thank you for this referral.    Arend Bahl A. Elvis Coil, [place], EP-C  Cardiac Rehabilitation  [phone]

## 2021-02-07 NOTE — Nurses Notes (Signed)
Restraint Continuation      Patient continues to have the following condition intubated and combative.       Least restrictive alternatives attempted : Provided 1:1 observation    Patient continues to exhibit the following behaviors: uncooperative, belligerent, agitated, withdrawn, kicking and hitting    The restraint continued to facilitate medical/surgical treatment to ensure safety.    The patient will continue to be evaluated and assessments documented on the flowsheet to ensure that the patient is released from the restraint at the earliest possible time

## 2021-02-07 NOTE — Care Plan (Signed)
Problem: Communication Impairment (Mechanical Ventilation, Invasive)  Goal: Effective Communication  Outcome: Ongoing (see interventions/notes)     Problem: Device-Related Complication Risk (Mechanical Ventilation, Invasive)  Goal: Optimal Device Function  Outcome: Ongoing (see interventions/notes)     Problem: Inability to Wean (Mechanical Ventilation, Invasive)  Goal: Mechanical Ventilation Liberation  Outcome: Ongoing (see interventions/notes)     Problem: Nutrition Impairment (Mechanical Ventilation, Invasive)  Goal: Optimal Nutrition Delivery  Outcome: Ongoing (see interventions/notes)     Problem: Skin and Tissue Injury (Mechanical Ventilation, Invasive)  Goal: Absence of Device-Related Skin and Tissue Injury  Outcome: Ongoing (see interventions/notes)     Problem: Ventilator-Induced Lung Injury (Mechanical Ventilation, Invasive)  Goal: Absence of Ventilator-Induced Lung Injury  Outcome: Ongoing (see interventions/notes)  Intervention: Prevent Ventilator-Associated Pneumonia  Recent Flowsheet Documentation  Taken 02/07/2021 1551 by Germain Osgood, RT  Head of Bed [facility]) Positioning: [place] elevated  Taken 02/07/2021 1215 by Germain Osgood, RT  Head of Bed [facility]) Positioning: [place] elevated  Taken 02/07/2021 1020 by Germain Osgood, RT  Head of Bed [facility]) Positioning: [place] elevated  Taken 02/07/2021 [id number] by Germain Osgood, RT  Head of Bed (HOB) Positioning: HOB elevated     PATIENT ON THE FOLLOWING CPAP SETTINGS:    FIO2 (%) 30    Peep(cm/H2O) 5    Pressure Support(cm/H2O) 8      PATIENT HAVING THICK SECRETIONS, HYPERTONIC ADDED TO XOPENEX AND PULMICORT NEBS. RT WILL CONTINUE TO MONITOR.

## 2021-02-07 NOTE — Care Management Notes (Signed)
[facility]  Care Management Note    Patient Name: [name] [name]  Date of Birth: 06/21/1985  Sex: female  Date/Time of Admission: 02/05/2021 12:34 AM  Room/Bed: 19/A  Payor: [place] MEDICAID / Plan: [place] MEDICAID / Product Type: Medicaid [place] /    LOS: 2 days   Primary Care Providers:  Pcp, No (General)    Admitting Diagnosis:  Endocarditis [I38]    Assessment:      02/07/21 1528   Assessment Detail   Assessment Type Continued Assessment   Date of Care Management Update 02/07/21   Date of Next DCP Update 02/09/21   Social Work Plan   Discharge Planning Status plan in progress   Projected Discharge Date 02/14/21   CM will evaluate for rehabilitation potential yes   Discharge Needs Assessment   Discharge Facility/Level of Care Needs Undetermined at this time       Discharge Plan:  Undetermined at this time  Per chart review, pt remains intubated, on ventilator support, and in restraints. Cardiac Surgery following and evaluating for surgical intervention. Pt also remains on CRRT, and a precedex drip. ID following; pt is on IV vancomycin for endocarditis (end date TBD). Pt is on continuous tube feeds. D/C date and disposition TBD, pending treatment/surgical plan, and pt's progress. MSW Jearld Shines will continue to follow.     The patient will continue to be evaluated for developing discharge needs.     Case Manager: Elease Hashimoto, [place]  Phone: [phone]

## 2021-02-07 NOTE — Consults (Signed)
INFECTIOUS DISEASE FOLLOW UP CONSULTATION    Patient Name: [name] [name] Number: [id number]  Date of Service: 02/07/2021  Date of Birth: 22-Jun-1985    Hospital Day:  LOS: 2 days     Reason for Consultation: Native Tricuspid valve MRSA infective endocarditis    Subjective:  It is reported the patient has intermittently follow commands.  She is on small amount Precedex sedation.  Patient will not follow commands or interact with [place] as we are in the room to voice or light touch.  Patient is on spontaneous mode of ventilator.  Data collected seems to be toward down trend of fever curve, but still intermittently febrile yesterday.  Nurse reports normal bowel movements.  No rash reported.    Objective:  Physical Exam:  Current Vitals: BP 121/84   Pulse (!) 128   Temp 37.4 C (99.3 F)   Resp (!) 22   Ht 1.651 m (_0 )   Wt 72.4 kg (159 lb 9.8 oz)   SpO2 100%   BMI 26.56 kg/m     Vitals in last 24 hours: Temp  Avg: 37.5 C (99.5 F)  Min: 36.7 C (98.06 F)  Max: 39 C (102.2 F)  MAP (Non-Invasive)  Avg: 81.5 mmHG  Min: 53 mmHG  Max: 101 mmHG  Pulse  Avg: 123  Min: 109  Max: 142  Resp  Avg: 25.7  Min: 16  Max: 39  SpO2  Avg: 98.1 %  Min: 94 %  Max: 100 %  General:  Ill appearing.  Mechanically ventilated and sedated.  Eyes: Pupils equal round. No conjunctival injection or scleral icterus.  ENT: Face symmetrical.  ET tube in place.  No mouth lesions.  Neck: Supple and symmetrical.   Right IJ in place.  Lungs: Clear to auscultation bilaterally to anterior and axillary lung fields on MV.   Cardiovascular:  Tachycardic.  Regular rhythm.  No murmur.  Abdomen:  BS+. Less Distended than prior. Soft and easily depressible.  Extremities: No joint erythema or swelling.  No cyanosis or edema.  Right AC with track marks.  Right groin with bandage. No erythema, drainage, or induration around site.  Bilateral upper extremities with edema.  Skin: Warm and dry.  No rash or lesions.  Neurologic:  Sedated.  Psychiatric:   Unable to assess.     Inpatient Medications:  acetaminophen (TYLENOL) tablet, 650 mg, Oral, Q4H PRN  budesonide (PULMICORT RESPULES) 0.5 mg/2 mL nebulizer suspension, 1 mg, Nebulization, 2x/day  chlorhexidine gluconate (PERIDEX) 0.12% mouthwash, 15 mL, Swish & Spit, 2x/day  dexmedeTOMIDine (PRECEDEX) 400 mcg in NS 100 mL (tot vol) infusion, 0.2 mcg/kg/hr (Adjusted), Intravenous, Continuous  docusate sodium (COLACE) 54m per mL oral liquid, 100 mg, Gastric (NG, OG, PEG, GT), 2x/day  famotidine (PEPCID) tablet, 10 mg, Gastric (NG, OG, PEG, GT), Daily  heparin 5,000 unit/mL injection, 5,000 Units, Subcutaneous, Q8HRS  HYDROmorphone (DILAUDID) 1 mg/mL injection, 0.3 mg, Intravenous, Q3H PRN  levalbuterol (XOPENEX) 0.63 mg/ 3 mL nebulizer solution, 0.63 mg, Nebulization, 3x/day   And  ipratropium (ATROVENT) 0.02% nebulizer solution, 0.5 mg, Nebulization, 3x/day  miconazole nitrate (SECURA) 2% topical cream, , Apply Topically, 2x/day  NS bolus infusion 40 mL, 40 mL, Intravenous, Once PRN  NS flush syringe, 2-6 mL, Intracatheter, Q8HRS   And  NS flush syringe, 2-6 mL, Intracatheter, Q1 MIN PRN  nystatin (MYCOSTATIN) 100,000 units/g topical cream, , Apply Topically, 2x/day  prismaSATE (bicarbonate-based) 4K-2.5Ca dialysate premix, , Hemodialysis, Dialysis Cont.  propofol (DIPRIVAN) 10 mg/mL premix  infusion, 10 mcg/kg/min (Adjusted), Intravenous, Continuous  QUEtiapine (SEROQUEL) tablet, 50 mg, Gastric (NG, OG, PEG, GT), 2x/day  senna concentrate (SENNA) 581m per 129moral liquid, 10 mL, Gastric (NG, OG, PEG, GT), 2x/day  sodium citrate 4% (3 mL) injection syringe, 2 Syringe, Intracatheter, Q1H PRN  SSIP insulin lispro 100 units/mL injection, 0-12 Units, Subcutaneous, Q4H PRN  vancomycin (VANCOCIN) 1 g in D5W 200 mL premix IVPB, 15 mg/kg (Adjusted), Intravenous, Q24H  Vancomycin IV - Pharmacist to Dose per Protocol, , Does not apply, Daily PRN        Current Antimicrobials:  Antibiotics (From admission, onward)    Start      Stop Route Frequency    02/05/21 0226  Vancomycin IV Intermittent Dosing         -- N/A DAILY PRN           Lines:  Patient Lines/Drains/Airways Status     Active Line / Dialysis Catheter / Dialysis Graft / Drain / Airway / Wound     Name Placement date Placement time Site Days    Peripheral IV Right Basilic  (medial side of arm) 02/04/21  2000  -- 2    Dialysis Catheter Trialysis Catheter 02/06/21  0000  -- 1    Oral Gastric Tube 02/05/21  2300  -- 1    EndoTracheal Tube Cuffed 7.5 02/06/21  2001  -- less than 1    Wound (Non-Surgical) Anterior;Right Elbow 02/05/21  0045  -- 2                 Laboratory Studies:  CBC Differential   Recent Labs     02/05/21  0055 02/06/21  0131 02/07/21  0026   WBC 11.3* 13.8* 15.1*   HGB 8.5* 7.8* 7.9*   HCT 25.7* 23.7* 23.4*   PLTCNT 88* 75* 99*   BANDS 2  --   --     Recent Labs     02/05/21  0055   PMNS 92   LYMPHOCYTES 2   MONOCYTES 4   EOSINOPHIL 0   BASOPHILS 0  <0.10   PMNABS 10.62*   MONOSABS 0.45   EOSABS <0.10      BMP LFTs   Recent Labs     02/07/21  0026   SODIUM 134*   POTASSIUM 4.4   CHLORIDE 103   CO2 22   BUN 62*   CREATININE 5.45*   GLUCOSENF 107   ANIONGAP 9   BUNCRRATIO 11   GFR 10*   CALCIUM 7.6*   MAGNESIUM 2.5   PHOSPHORUS 5.6*   ALBUMIN 1.3*    Recent Labs     02/07/21  0026   TOTALPROTEIN 6.4   ALBUMIN 1.3*   TOTBILIRUBIN 1.2   BILIRUBINCON 0.8*   AST 29   ALT 11   ALKPHOS 200*      CoAgs Blood Gas:   Recent Labs     02/06/21  1013   PROTHROMTME 16.7*   INR 1.44*   APTT 32.9    Recent Labs     02/07/21  0345   FI02 30.0   PH 7.42*   PCO2 38*   PO2 34*   BICARBONATE 24.3   BASEEXCESS 0.3       Cardiac Markers Lipid Panel   No results for input(s): TROPONINI, CKMB, MBINDEX, BNP in the last 72 hours. No results found for this encounter   Urine Analysis Other Labs   No results found for this encounter No results  found for this encounter    Invalid input(s): [place]     Microbiology:  Hospital Encounter on 02/05/21 (from the past 96 hour(s))   BRONCHOALVEOLAR LAVAGE  (BAL) QUANTITATIVE CULTURE/GRAM STAIN    Collection Time: 02/05/21 10:19 AM    Specimen: Bronchoalveolar Lavage; Other   Culture Result Status    BRONCHOALVEOLAR LAVAGE (BAL) QUANTITATIVE CULTURE 15000 Methicillin Resistant Staphylococcus aureus (A) Preliminary    BRONCHOALVEOLAR LAVAGE (BAL) QUANTITATIVE CULTURE 2000 Normal Oral Flora Preliminary    GRAM STAIN 3+ Several PMNs Preliminary    GRAM STAIN No Organisms Seen Preliminary       Susceptibility    Methicillin Resistant Staphylococcus aureus - MIC SUSCEPTIBILITY     Oxacillin >=4 Resistant mcg/mL     Gentamicin* <=0.5 Sensitive mcg/mL      * The role of gentamicin for the treatment of MSSA and MRSA infections is in combination with a cell-wall acting agent in the setting of prosthetic valve endocarditis.     Erythromycin >=8 Resistant mcg/mL     Clindamycin 0.25 Sensitive mcg/mL     Tetracycline <=1 Sensitive mcg/mL     Doxycycline <=0.5 Sensitive mcg/mL     Linezolid 2 Sensitive mcg/mL     Vancomycin 1 Sensitive mcg/mL     Tigecycline <=0.12 Sensitive mcg/mL     Rifampin* <=0.5 Sensitive mcg/mL      * Rifampin should not be used alone for the treatment of Staphylococcus due to the quick emergence of resistance. For use in combination with other active agents for prosthetic material biofilm penetration.      Trimethoprim/Sulfamethoxazole <=10 Sensitive mcg/mL   ADULT ROUTINE BLOOD CULTURE, SET OF 2 ADULT BOTTLES (BACTERIA AND YEAST)    Collection Time: 02/05/21  9:55 PM    Specimen: Blood   Culture Result Status    BLOOD CULTURE, ROUTINE Abnormal Stain (AA) Preliminary    GRAM STAIN Gram Positive Cocci/Clusters (A) Preliminary   ADULT ROUTINE BLOOD CULTURE, SET OF 2 ADULT BOTTLES (BACTERIA AND YEAST)    Collection Time: 02/05/21  9:55 PM    Specimen: Blood   Culture Result Status    BLOOD CULTURE, ROUTINE Abnormal Stain (AA) Preliminary    GRAM STAIN Gram Positive Cocci/Clusters (A) Preliminary   MRSA/MSSA MOLECULAR SCREEN, BLOOD    Collection Time: 02/05/21   9:55 PM    Specimen: Blood   Culture Result Status    METHICILLIN RESISTANCE Positive (A) Final    STAPHYLOCOCCUS AUREUS Positive (A) Final    Narrative    INTERPRETATION: Methicillin Resistant S. aureus (MRSA)     Preliminary molecular testing has detected genetic markers for Staph aureus (spa) and methicillin resistance (mecA and SCCmec).  Standard isolate identification and antimicrobial susceptibility testing results are forthcoming.     Methicillin resistance/susceptibility in organisms other than Staphylococcus aureus CANNOT be determined by this assay.    Performed by PCR using the MRSA/SA Blood Culture Assay and the GeneXpert system. Test performance on this bottle type verified at [facility] using an FDA cleared assay. Results are preliminary and should be interpreted within the clinical context.       No results found for any visits on 02/05/21 (from the past 24 hour(s)).    Imaging Studies:  Results for orders placed or performed during the hospital encounter of 02/05/21 (from the past 72 hour(s))   XR AP MOBILE CHEST     Status: None    Narrative    XR AP MOBILE CHEST performed on 02/05/2021 1:17 AM  INDICATION: 34 years old Female; Mechanical vent    TECHNIQUE: 1 views of the chest; 1 images    COMPARISON: None available    SUPPORT DEVICES: Endotracheal tube tip projects about 2 cm from the carina. Enteric tube tip projects below the diaphragm.    FINDINGS:  The heart is normal in size. There is no significant pleural effusion or evidence of pneumothorax. Few tiny nodular opacities with possible cavitation for example at left lung base, concerning for infectious/inflammatory process.      Impression    1.Few nodular opacities with possible cavitation, concerning for infectious/inflammatory process.  2.Endotracheal tube tip projects about 2 cm from the carina.   CT BRAIN WO IV CONTRAST     Status: None    Narrative    [name] [name]  Female, 35 years old.    CT BRAIN WO IV CONTRAST performed on 02/05/2021  11:28 AM.    REASON FOR EXAM:  altered mental status  RADIATION DOSE: 1220.40 mGycm    COMPARISON: None.    TECHNIQUE: Axial CT images were performed from the vertex through the skull base without administration of contrast. Sagittal and coronal reformats were also submitted for review.    FINDINGS:    The ventricles are normal in configuration, size and symmetry. The gray-white interface is grossly preserved. There is no evidence of acute hemorrhage, abnormal fluid collection, mass effect or midline shift. There is partial left mastoid effusion with fluid seen layering in the inferior most mastoid cells. The visualized paranasal sinuses and mastoid air cells are otherwise clear. There is no evidence of acute fracture or suspicious osseous lesion.      Impression    No evidence of acute intracranial process.   CT FACIAL BONES WO IV CONTRAST     Status: None    Narrative    [name] [name]  Female, 35 years old.    CT FACIAL BONES WO IV CONTRAST performed on 02/05/2021 11:28 AM.    REASON FOR EXAM:  Preop    RADIATION DOSE: 614.50 mGycm    COMPARISON: Concurrent CT brain    TECHNIQUE: Axial CT images were obtained from the skull base through the hyoid without administration of intravenous contrast. Sagittal and coronal reformats were also submitted for review.    FINDINGS:    The tracheostomy and enteric tubes are partially visualized. The maxilla is edentulous. There is periapical lucency surrounding the right mandibular first premolar root fragments with anterior cortical breakthrough. Periapical lucency is also noted at the left first premolar.. There are multiple dental caries. No definitive loculated soft tissue fluid collection to suggest abscess within the limitations of noncontrast examination. There is a small right maxillary mucous retention cyst. The paranasal sinuses are otherwise clear.      Impression    Multiple mandibular dental caries with periapical lucencies surrounding the root fragments of the first  premolars. Dental follow-up recommended.   CT CHEST ABDOMEN PELVIS WO IV CONTRAST     Status: None    Narrative    [name] [name]  Female, 35 years old.    CT CHEST ABDOMEN PELVIS WO IV CONTRAST performed on 02/05/2021 11:29 AM.    REASON FOR EXAM:  hypoxia    Patient has a history of IV drug use and presented on 01/31/2021 and outside hospital with drug overdose, aspiration pneumonia and endocarditis.    RADIATION DOSE: 817.00 mGycm    COMPARISON: None    FINDINGS:   CHEST:  LUNGS: Bilateral patchy airspace opacities are  noted, some of which demonstrate cavitation and are consistent with septic emboli. Airspace opacity is noted at the lung bases.    HEART/MEDIASTINUM: Cardiac size is at the upper limits of normal. Trace pericardial effusion is noted. The thyroid is symmetric.    PLEURA: No effusion    LYMPH NODES: No adenopathy    BONES/SOFT TISSUES: Soft tissues are normal. Vertebral body height is maintained.    ABDOMEN/PELVIS:   LIVER: The liver is enlarged but normal in attenuation    BILIARY SYSTEM/GALLBLADDER: The gallbladder is distended. No bile duct dilation.    PANCREAS: Grossly normal    KIDNEY/URETERS: No hydronephrosis. No renal calculi.    ADRENALS: No adrenal mass    SPLEEN: Spleen is enlarged    BOWEL: No abnormally distended loops of bowel are identified to suggest obstruction. Moderate volume fecal retention is noted.    VASCULATURE/LYMPH NODES: No definite adenopathy. Right-sided femoral central line is noted.    BLADDER: Bladder is decompressed. Punctate focus of air is likely from previous catheter placement.    REPRODUCTIVE ORGANS: Not identified    PERITONEAL CAVITY: Trace ascites is noted in the pelvis.    BONES/SOFT TISSUES: There is mild anasarca. Vertebral body height is maintained.      Impression    1. Bilateral airspace opacities, some of which are cavitary likely representing septic emboli. More confluent airspace opacity is noted dependently at both lung bases and may represent a  component of aspiration pneumonia.  2. Hepatosplenomegaly.  3. Trace ascites in the pelvis and mild anasarca.     XR ABD X-RAY CHECK DOBHOFF PLACEMENT     Status: Abnormal    Narrative    [name] [name]  Female, 35 years old.    XR ABD X-RAY CHECK DOBHOFF PLACEMENT performed on 02/05/2021 11:28 PM.    REASON FOR EXAM:  OG placement    TECHNIQUE: 1 views/2 images submitted for interpretation.    COMPARISON:  CT chest abdomen and pelvis 02/05/2021      Impression    Enteric tube across the diaphragm with tip overlying the distal body of the stomach. Side-port is 9 cm caudal to the diaphragmatic hiatus. The esophageal temperature probe also crosses the diaphragm with tip projecting over the upper sacrum. Subsequent radiograph demonstrates significant retraction of the esophageal temperature probe.   XR ABD X-RAY CHECK DOBHOFF PLACEMENT     Status: None    Narrative    [name] [name]  Female, 35 years old.    XR ABD X-RAY CHECK DOBHOFF PLACEMENT performed on 02/05/2021 11:53 PM.    REASON FOR EXAM:  OG readjusted    TECHNIQUE: 1 views/1 images submitted for interpretation.    COMPARISON:  Abdominal radiograph 02/05/2021    FINDINGS:  Enteric tube is seen with tip overlying the distal gastric body and side port 9 cm below the GE junction. Esophageal temperature probe has been retracted since prior exam, with tip now overlying the expected location of the GE junction. Visualized bowel gas pattern is nonobstructive, with nondilated loops of bowel. The included lung bases are clear.      Impression    1.Appropriately positioned enteric tube with tip in the gastric body.  2.Interval retraction of esophageal temperature probe with tip at the level of the GE junction.   XR AP MOBILE CHEST     Status: None    Narrative    XR AP MOBILE CHEST performed on 02/06/2021 12:49 AM    INDICATION: 35 years old Female; cvc  placement    TECHNIQUE: 1 views of the chest; 1 images    COMPARISON: X-ray chest AP 2020 2022    SUPPORT DEVICES: Interval  placement of the right IJ central venous catheter, tip projecting over the right atrium. Endotracheal tube, enteric tube and esophageal temperature probe are in place.    FINDINGS:  Multifocal cavitary lesions in bilateral lung fields with small bilateral pleural effusion. Cardiac size is within normal limit without pulmonary edema. No pneumothorax.      Impression    Interval placement of the right IJ central venous catheter, tip projecting over right atrium. No pneumothorax.   XR AP MOBILE CHEST     Status: None    Narrative    [name] [name]  Female, 35 years old.    XR AP MOBILE CHEST performed on 02/06/2021 5:56 AM.    REASON FOR EXAM:  ETT, endocarditis    TECHNIQUE: 1 views/1 images submitted for interpretation.    COMPARISON:  Same date at 12:38 AM      Impression    There are stable support devices with esophageal temperature probe tip at the level of the gastroesophageal junction. Slight retraction is recommended. Mixed interstitial and airspace opacities and cavitary opacities are redemonstrated. No visible pneumothorax.     Impression:  [name] [name] is a 35 y.o., White female with a past medical history of IVDU, frequent kidney stones with concern for recurrent UTIs vs pyelo, and hep C (unsure of viral load).  Patient was [facility] 8/18 after being found down with suspected overdose.  She required intubation due to bradypnea and lethargy as well as question for aspiration pneumonia.  Blood cultures have been positive for MRSA from 08/18 through 8/21 (this isolate was susceptible to vancomycin at outside facility). Additionally, a respiratory culture was positive for MRSA.  Patient had TTE at outside facility that suggested tricuspid regurgitation and large tricuspid vegetation.  Patient had severe anuric AKI and has required HD. Patient transferred to [facility] for cardiac surgery evaluation.    Upon admission to [facility] 8/22, patient is intubated on minimal vent  settings.  She has recently been on HD due to severe anuric AKI.  She has been receiving cefepime and vancomycin, which has been deescalated to vancomycin monotherapy due to suspicion that this is all monomicrobial process with MRSA.  Repeat TTE 8/22 shows 1.4 x 0.8 cm tricuspid valve vegetation with mild tricuspid valve regurgitation.  Additionally, lung imaging suggests multiple lung nodularities and possible cavitation, which is suggestive of bilateral septic pulmonary emboli.  Blood cultures 8/22 growing MRSA.  BAL 8/22 with greater than 15,000 MRSA.  Blood cultures 8/24 already positive with what will likely be MRSA.    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  Patient may need line holiday.  Agree with continuing vancomycin monotherapy pharmacy to dose protocol.  Please keep in mind patient's HD requirements.    We will ask Micro to run ceftaroline susceptibilities on our blood cultures.    Please continue to follow CBC with differential, BUN, creatinine, hepatic function panel, and CRP while the patient remains on antimicrobial therapy.    Thank you for this consultation.  We will continue to follow.  Please call or page the Infectious Diseases Service with any questions regarding this patient.    Reatha Harps, MD  02/07/2021, 16:35    02/07/2021    I saw and examined  the patient.  I reviewed the fellow's note.  I agree with the findings and plan of care as documented in the fellow's note.  Any exceptions/additions are edited/noted.     On the day of the encounter, a total of 15 minutes was spent in direct/indirect care of this patient including evaluation, review and interpretation of laboratory and microbiology studies, review of radiology studies and the medical record, order entry and coordination of care, counseling and educating the patient/family/caregiver, and documentation.    Marlene Lard,  M.D.  Assistant Professor  Section of Infectious Diseases  [employer] [facility] [employer]  02/07/2021, 16:44

## 2021-02-08 ENCOUNTER — Inpatient Hospital Stay (HOSPITAL_COMMUNITY): Payer: Medicaid Other

## 2021-02-08 ENCOUNTER — Encounter (HOSPITAL_COMMUNITY): Payer: Self-pay

## 2021-02-08 DIAGNOSIS — Z452 Encounter for adjustment and management of vascular access device: Secondary | ICD-10-CM

## 2021-02-08 DIAGNOSIS — R918 Other nonspecific abnormal finding of lung field: Secondary | ICD-10-CM

## 2021-02-08 DIAGNOSIS — Z4682 Encounter for fitting and adjustment of non-vascular catheter: Secondary | ICD-10-CM

## 2021-02-08 DIAGNOSIS — I330 Acute and subacute infective endocarditis: Secondary | ICD-10-CM

## 2021-02-08 DIAGNOSIS — I368 Other nonrheumatic tricuspid valve disorders: Secondary | ICD-10-CM

## 2021-02-08 LAB — ADULT ROUTINE BLOOD CULTURE, SET OF 2 BOTTLES (BACTERIA AND YEAST)
BLOOD CULTURE, ROUTINE: ABNORMAL — CR
BLOOD CULTURE, ROUTINE: ABNORMAL — CR
BLOOD CULTURE, ROUTINE: ABNORMAL — CR
BLOOD CULTURE, ROUTINE: ABNORMAL — CR

## 2021-02-08 LAB — MAGNESIUM: MAGNESIUM: 2.3 mg/dL (ref 1.8–2.6)

## 2021-02-08 LAB — VENOUS BLOOD GAS, CO-OX, LYTES, LACTATE REFLEX
%FIO2 (VENOUS): 21 %
BASE EXCESS: 3.6 mmol/L — ABNORMAL HIGH (ref <=3.0)
BICARBONATE (VENOUS): 27 mmol/L — ABNORMAL HIGH (ref 22.0–26.0)
CARBOXYHEMOGLOBIN: 1.4 % (ref 0.0–2.5)
CHLORIDE: 101 mmol/L (ref 101–111)
GLUCOSE: 101 mg/dL (ref 60–105)
HEMOGLOBIN: 7.9 g/dL — ABNORMAL LOW (ref 12.0–18.0)
IONIZED CALCIUM: 1.15 mmol/L (ref 1.10–1.35)
LACTATE: 0.8 mmol/L (ref 0.0–1.3)
O2CT: 5.2 % — ABNORMAL LOW (ref 6.7–17.5)
OXYHEMOGLOBIN: 46.3 % (ref 40.0–80.0)
PCO2 (VENOUS): 39 mm/Hg — ABNORMAL LOW (ref 41–51)
PH (VENOUS): 7.46 — ABNORMAL HIGH (ref 7.31–7.41)
PO2 (VENOUS): 33 mm/Hg — ABNORMAL LOW (ref 35–50)
SODIUM: 132 mmol/L — ABNORMAL LOW (ref 137–145)
WHOLE BLOOD POTASSIUM: 4.9 mmol/L — ABNORMAL HIGH (ref 3.5–4.6)

## 2021-02-08 LAB — MANUAL DIFF AND MORPHOLOGY-SYSMEX
BASOPHIL #: 0.4 10*3/uL — ABNORMAL HIGH (ref <=0.20)
BASOPHIL %: 2 %
EOSINOPHIL #: <0.1 10*3/uL (ref <=0.50)
EOSINOPHIL %: 0 %
LYMPHOCYTE #: 0.6 10*3/uL — ABNORMAL LOW (ref 1.00–4.80)
LYMPHOCYTE %: 3 %
MONOCYTE #: 0.2 10*3/uL (ref 0.20–1.10)
MONOCYTE %: 1 %
MYELOCYTE %: 1 %
NEUTROPHIL #: 18.69 10*3/uL — ABNORMAL HIGH (ref 1.50–7.70)
NEUTROPHIL %: 91 %
NEUTROPHIL BANDS %: 2 %
RBC MORPHOLOGY: NORMAL

## 2021-02-08 LAB — VENOUS BLOOD GAS, CO-OX, LYTES, LACTATE REFLEX - INACTIVE: MET-HEMOGLOBIN: 0.6 % (ref 0.0–2.0)

## 2021-02-08 LAB — VENOUS BLOOD GAS WITH LACTATE REFLEX
%FIO2 (VENOUS): 35 %
BASE EXCESS: 3.2 mmol/L — ABNORMAL HIGH (ref <=3.0)
BICARBONATE (VENOUS): 26.6 mmol/L — ABNORMAL HIGH (ref 22.0–26.0)
LACTATE: 0.7 mmol/L (ref 0.0–1.3)
PCO2 (VENOUS): 42 mm/Hg (ref 41–51)
PH (VENOUS): 7.43 — ABNORMAL HIGH (ref 7.31–7.41)
PO2 (VENOUS): 34 mm/Hg — ABNORMAL LOW (ref 35–50)

## 2021-02-08 LAB — BPAM PACKED CELL ORDER
UNIT DIVISION: 0
UNIT DIVISION: 0

## 2021-02-08 LAB — CBC WITH DIFF
HCT: 24.9 % — ABNORMAL LOW (ref 34.8–46.0)
HGB: 8.2 g/dL — ABNORMAL LOW (ref 11.5–16.0)
MCH: 24.8 pg — ABNORMAL LOW (ref 26.0–32.0)
MCHC: 32.9 g/dL (ref 31.0–35.5)
MCV: 75.2 fL — ABNORMAL LOW (ref 78.0–100.0)
MPV: 10.6 fL (ref 8.7–12.5)
PLATELETS: 148 10*3/uL — ABNORMAL LOW (ref 150–400)
RBC: 3.31 10*6/uL — ABNORMAL LOW (ref 3.85–5.22)
RDW-CV: 21.7 % — ABNORMAL HIGH (ref 11.5–15.5)
WBC: 20.1 10*3/uL — ABNORMAL HIGH (ref 3.7–11.0)

## 2021-02-08 LAB — BASIC METABOLIC PANEL
ANION GAP: 4 mmol/L (ref 4–13)
BUN/CREA RATIO: 13 (ref 6–22)
BUN: 34 mg/dL — ABNORMAL HIGH (ref 8–25)
CALCIUM: 7.1 mg/dL — ABNORMAL LOW (ref 8.5–10.0)
CHLORIDE: 105 mmol/L (ref 96–111)
CO2 TOTAL: 25 mmol/L (ref 22–30)
CREATININE: 2.57 mg/dL — ABNORMAL HIGH (ref 0.60–1.05)
ESTIMATED GFR: 24 mL/min/BSA — ABNORMAL LOW (ref >=60)
GLUCOSE: 113 mg/dL (ref 65–125)
POTASSIUM: 4.5 mmol/L (ref 3.5–5.1)
SODIUM: 134 mmol/L — ABNORMAL LOW (ref 136–145)

## 2021-02-08 LAB — POC BLOOD GLUCOSE (RESULTS)
GLUCOSE, POC: 114 mg/dl — ABNORMAL HIGH (ref 70–105)
GLUCOSE, POC: 98 mg/dl (ref 70–105)
GLUCOSE, POC: 90 mg/dl (ref 70–105)
GLUCOSE, POC: 98 mg/dl (ref 70–105)
GLUCOSE, POC: 118 mg/dl — ABNORMAL HIGH (ref 70–105)
GLUCOSE, POC: 92 mg/dl (ref 70–105)

## 2021-02-08 LAB — PHOSPHORUS: PHOSPHORUS: 2.8 mg/dL (ref 2.4–4.7)

## 2021-02-08 LAB — CBC
HCT: 23.7 % — ABNORMAL LOW (ref 34.8–46.0)
HGB: 8 g/dL — ABNORMAL LOW (ref 11.5–16.0)
MCH: 25.3 pg — ABNORMAL LOW (ref 26.0–32.0)
MCHC: 33.8 g/dL (ref 31.0–35.5)
MCV: 75 fL — ABNORMAL LOW (ref 78.0–100.0)
MPV: 10.5 fL (ref 8.7–12.5)
PLATELETS: 137 10*3/uL — ABNORMAL LOW (ref 150–400)
RBC: 3.16 10*6/uL — ABNORMAL LOW (ref 3.85–5.22)
RDW-CV: 21.8 % — ABNORMAL HIGH (ref 11.5–15.5)
WBC: 21.1 10*3/uL — ABNORMAL HIGH (ref 3.7–11.0)

## 2021-02-08 LAB — TYPE AND CROSS RED CELLS - UNITS
ABO/RH(D): O POS
ANTIBODY SCREEN: NEGATIVE
UNITS ORDERED: 2

## 2021-02-08 LAB — H & H
HCT: 25.1 % — ABNORMAL LOW (ref 34.8–46.0)
HCT: 25 % — ABNORMAL LOW (ref 34.8–46.0)
HCT: 25.1 % — ABNORMAL LOW (ref 34.8–46.0)
HGB: 8.1 g/dL — ABNORMAL LOW (ref 11.5–16.0)
HGB: 8.1 g/dL — ABNORMAL LOW (ref 11.5–16.0)
HGB: 8.3 g/dL — ABNORMAL LOW (ref 11.5–16.0)

## 2021-02-08 LAB — VANCOMYCIN, RANDOM: VANCOMYCIN RANDOM: 19.4 ug/mL — ABNORMAL LOW (ref 30.0–40.0)

## 2021-02-08 MED ORDER — FAMOTIDINE 20 MG TABLET
10.0000 mg | ORAL_TABLET | Freq: Every day | ORAL | Status: DC
Start: 2021-02-09 — End: 2021-02-09

## 2021-02-08 MED ORDER — VITAMIN B COMPLEX AND C NO.10-FOLIC ACID 900 MCG/5 ML ORAL LIQUID
5.0000 mL | Freq: Every day | ORAL | Status: DC
Start: 2021-02-09 — End: 2021-02-12
  Administered 2021-02-10: 0 ug via ORAL
  Administered 2021-02-11 – 2021-02-12 (×2): 900 ug via ORAL
  Filled 2021-02-08 (×4): qty 5

## 2021-02-08 MED ORDER — QUETIAPINE 50 MG TABLET
50.0000 mg | ORAL_TABLET | Freq: Two times a day (BID) | ORAL | Status: DC
Start: 2021-02-08 — End: 2021-02-12
  Administered 2021-02-08 – 2021-02-10 (×3): 0 mg via ORAL
  Administered 2021-02-10 – 2021-02-11 (×3): 50 mg via ORAL
  Filled 2021-02-08 (×5): qty 1

## 2021-02-08 MED ORDER — SODIUM CHLORIDE 0.9 % (FLUSH) INJECTION SYRINGE
20.0000 mL | INJECTION | INTRAMUSCULAR | Status: DC | PRN
Start: 2021-02-08 — End: 2021-02-15

## 2021-02-08 MED ORDER — SODIUM CHLORIDE 0.9 % (FLUSH) INJECTION SYRINGE
10.0000 mL | INJECTION | Freq: Three times a day (TID) | INTRAMUSCULAR | Status: DC
Start: 2021-02-08 — End: 2021-02-15
  Administered 2021-02-08: 0 mL via INTRAVENOUS
  Administered 2021-02-09: 10 mL via INTRAVENOUS
  Administered 2021-02-09 (×2): 0 mL via INTRAVENOUS
  Administered 2021-02-10: 10 mL via INTRAVENOUS
  Administered 2021-02-10: 0 mL via INTRAVENOUS
  Administered 2021-02-10: 10 mL via INTRAVENOUS
  Administered 2021-02-11 – 2021-02-13 (×7): 0 mL via INTRAVENOUS
  Administered 2021-02-13: 10 mL via INTRAVENOUS
  Administered 2021-02-13 – 2021-02-15 (×6): 0 mL via INTRAVENOUS

## 2021-02-08 MED ORDER — LIDOCAINE (PF) 20 MG/ML (2 %) INJECTION SOLUTION
INTRAMUSCULAR | Status: AC
Start: 2021-02-08 — End: 2021-02-09
  Filled 2021-02-08: qty 5

## 2021-02-08 MED ORDER — DOCUSATE SODIUM 100 MG CAPSULE
100.0000 mg | ORAL_CAPSULE | Freq: Two times a day (BID) | ORAL | Status: DC
Start: 2021-02-08 — End: 2021-02-15
  Administered 2021-02-08 – 2021-02-09 (×2): 0 mg via ORAL
  Administered 2021-02-10: 100 mg via ORAL
  Administered 2021-02-10 – 2021-02-12 (×5): 0 mg via ORAL
  Administered 2021-02-13 – 2021-02-14 (×4): 100 mg via ORAL
  Filled 2021-02-08 (×7): qty 1

## 2021-02-08 MED ORDER — PRISMASATE (BICARBONATE-BASED) 4K DIALYSATE
ARTERIOVENOUS_FISTULA | Status: AC
Start: 2021-02-08 — End: 2021-02-09

## 2021-02-08 MED ORDER — POLYETHYLENE GLYCOL 3350 17 GRAM ORAL POWDER PACKET
17.0000 g | Freq: Every day | ORAL | Status: DC
Start: 2021-02-09 — End: 2021-02-15
  Administered 2021-02-10 – 2021-02-12 (×3): 0 g via ORAL
  Administered 2021-02-14: 17 g via ORAL
  Filled 2021-02-08: qty 1

## 2021-02-08 NOTE — Consults (Signed)
Nephrology Consult follow-up:   Patient Name: [name] [name]  Date of Birth: 1986-04-19  Date of service: 02/08/2021  Hospital Stay LOS: 3     S/O:   - I did not examine the patient today.  - Laboratory, hemodynamic and radiological data reviewed this morning.   - Medication list reviewed as well.     Impression/Recommendations:  35 y.o. female with a significant PMH for Smoking and IVDU presented to [place] [facility] with AMS on 01/31/21. She was found to have a drug overdose, aspiration PNA requiring intubation, MRSA bacteremia, large TV endocarditis with moderate TR and elevated systemic pulmonary pressures (60mhg), AKI requiring HD, anemia requiring PRBC and thrombocytopenia. Urine drug screen + for heroin and methamphetamines. [facility] was [facility] medical management and surgical evaluation, and nephrology was consulted for HD 8/23.     Patient remains on CRRT at this time with no urine output  We will sign of for the time being. Please consult for any HD needs once patient is off of CRRT.    This patient's care was discussed with supervising physician.   AJohnney Killian MD 02/08/2021, 11:20          Attending Note:  I did not examine the patient today.   No new recommendations at this time.   Will sign off today as she remains on CRRT managed by [facility]  Please call with any questions or concerns.    Late entry for 02/08/2021  I did not see or examine the patient today. I reviewed the resident's note.  I agree with the findings and plan of care as documented in the resident's note.  Any exceptions/additions are edited/noted.          MNathaniel Man MD  Assistant Professor  Section of Nephrology  [facility]8/31/2022, 15:57

## 2021-02-08 NOTE — Progress Notes (Signed)
[facility]  HVI Critical Care Consult/Progress Note    [name] [name], Sliwinski, 35 y.o. female  Date of Birth: 1985/09/12  Medical Record Number:  [id number]   Inpatient Admission Date: 02/05/2021   Hospital Day:  LOS: 0 days     Chief Complaint: Altered Mental Status   History of Present Illness: Per previous providers: "Pt is a 35 yo female with a significant PMH for Smoking and IVDU presented to [name] with AMS on 01/31/21. She was found to have a drug overdose, aspiration PNA requiring intubation, MRSA bacteremia, large TV endocarditis with moderate TR and elevated systemic pulmonary pressures (56mhg), AKI requiring HD, anemia requiring PRBC and thrombocytopenia. Urine drug screen + for heroin and methamphetamines.    [place] [facility] [place]."   POD [facility]/ [facility]: [facility]/Major events:  8/22: See above.   -Prop for sedation. Agitated; MAE; does not follow. Repeat CT Head/CAP. Hemodynamics stable. Intubated. Repeated blood cultures. Continue Vanc.   8/23: had pan CT 8/22, still not awakening/folowing commands on precedex  But agitated. MRI brain ordered, started on CRRT  8/24: remains on CRRT, MRI Brain no abnormality, following commands on dexmedetomidine, quetiapine, prn dilaudid  8/25 brocnh today for increased ETT secretions,plan for OR early next week, OMFS following for dental root extriction.  Pt self-extubated this afternoon.  Doing well on NC.   Past Medical History: No past medical history on file.    Past Surgical History: No past surgical history on file.   Social History:  Social History           Socioeconomic History   . Marital status: Divorced     Spouse name: Not on file   . Number of children: Not on file   . Years of education: Not on file   . Highest education level: Not on file   Occupational History   . Not on file   Tobacco Use   . Smoking status:  Not on file   . Smokeless tobacco: Not on file   Substance and Sexual Activity   . Alcohol use: Not on file   . Drug use: Not on file   . Sexual activity: Not on file   Other Topics Concern   . Not on file   Social History Narrative   . Not on file     Social Determinants of Health     Financial Resource Strain: Not on file   Food Insecurity: Not on file   Transportation Needs: Not on file   Physical Activity: Not on file   Stress: Not on file   Intimate Partner Violence: Not on file   Housing Stability: Not on file      Family History: Family Medical History:       None             Allergies: No Known Allergies   ROS:  Unobtainable because intubated/sedated      Problem List:       Patient Active Problem List   Diagnosis   . Endocarditis       Medications:  Scheduled Meds: chlorhexidine gluconate, 15 mL, 2x/day  docusate sodium, 100 mg, 2x/day  famotidine, 10 mg, Daily  NS flush, 2-6 mL, Q8HRS  perflutrn lipid microspheres (DEFINITY) DILUTION injection, 2 mL, Give in Cardiology  propofoL, ,   senna concentrate, 10 mL, 2x/day  IV Infusions: propofoL, Last Rate: 20 mcg/kg/min (02/05/21 0220)      PRN:  NS flush, 2-6 mL, Q1 MIN PRN  insulin lispro, 0-12 Units, Q4H PRN  Pharmacy to Dose Vancomycin, , Daily PRN  Vancomycin IV Intermittent Dosing, , Daily PRN        Last VS:    Heart Rate: (!) 126  Respiratory Rate: (!) 23  BP (Non-Invasive): 130/84    Physical Exam  Vitals and nursing note reviewed.   Constitutional:       General: She is in acute distress.      Appearance: She is ill-appearing.      Comments: Critically ill appearing female, no acute distress, becomes restless at times, wants ETT out, following commands   HENT:      Head: Normocephalic and atraumatic.      Nose: Nose normal.      Mouth/Throat:      Mouth: Mucous membranes are moist.   Eyes:      General: No scleral icterus.        Right eye: No discharge.         Left eye: No discharge.      Extraocular Movements: Extraocular  movements intact.      Conjunctiva/sclera: Conjunctivae normal.      Pupils: Pupils are equal, round, and reactive to light.   Cardiovascular:      Rate and Rhythm: Tachycardia present.      Pulses: Normal pulses.   Pulmonary:      Effort: Pulmonary effort is normal. No respiratory distress.      Breath sounds: No stridor. No wheezing, rhonchi or rales.      Comments: On mechanical vent  Abdominal:      General: Abdomen is flat. Bowel sounds are normal. There is no distension.      Palpations: Abdomen is soft. There is no mass.      Hernia: No hernia is present.      Comments: Unable to assess for tenderness/guarding as pt with altered mental status   Musculoskeletal:         General: Swelling present. Normal range of motion.      Cervical back: Normal range of motion and neck supple. No rigidity.      Right lower leg: Edema present.      Left lower leg: Edema present.   Skin:     General: Skin is warm and dry.      Capillary Refill: Capillary refill takes less than 2 seconds.      Findings: No bruising, lesion or rash.   Neurological:      General: No focal deficit present.      Comments: Follows commands, shakes head yes/no appropriately   Psychiatric:      Comments: Unable to assess 2/2 pt intubated and sedated           [x] ?I have reviewed the patient's vitals signs, the nursing notes, the physician's notes and other progress notes     [x] ?I have reviewed the laboratory and imaging data    [x] ?I have reviewed the records from [place]     [x] ?I have discussed this case with CCM team      Systems Based Assessment and Plan:    Neurologic:  Data/Assessment Reviewed    Diagnosis Altered mental status 2/2 encephalopathy   Overdose 2/2 IVDU  IVD withdrawal   Course: Critical   Plan Continue dexmedetomidine and titrate to rass 0 to -1  Continue quetiapine 50  mg bid  PRN pain medication: dilaudid 0.14m ivp prn  MRI Brain 8/23 negative  Day/night policy, reorientation as needed  PT/OT  Serial neuro checks        Cardiovascular:  Data/Assessment Most Recent Hemodynamics: Reviewed   Cardiovascular support: NA     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  Moderate TR  Mild Pulmonary HTN   Course Critical   Plan Endocarditis:   -Repeat TTE done and reviewed  -Continue Vanc  -Repeat Blood cultures done 8/22  SCT evaluating pt for surgery- will inform them re pt's improving neuro function, continue to optimize pt medically  MAP goal  >65     Respiratory:  Data/Assessment: CXR: Reviewed   Most recent VBG: Reviewed   Vent Settings: Reviewed      Diagnosis Acute respiratory failure 2/2 Aspiration PNA and altered mental status  Septic pulmonary emboli  Smoker     Course Critical   Plan Wean vent as tolerated-continue pressure support ventilation  Volume removal on CRRT to achieve euvolemia then assess for extubation  FIO2 wean as tolerated for O2 sats>92%  Continue vent support for airway protection-OMFS planning for dental extraction, will base possible extubation on there OR date  Pulm toilet; scheduled nebs         Renal:  Data/Assessment: Reviewed    Diagnosis Acute kidney injury - multifactorial: septic shock 2/2 MRSA TV endocarditis, IVDU  Hypervolemia and anasarca   Course critical   Plan Continue CRRT which was initiated 02/06/21- increase UF as tolerated to achieve euvolemia  Monitor electrolytes, clearance as needed       Gastrointestinal:  Data/Assessment: Famotidine (Pepcid)  for stress ulcer prophylaxis   Diet: tube feeds   Bowel Regimen:  Colace and Senna    Diagnosis Dysphagia  Enlarged liver  Distended gallbladder with mild elevation in Alk phos and bilirubin  constipation   Course  Severe   Plan Tube feeds - titrate to goal rate - will d/w dietitian re any further recs  Can change nepro to osmolite 1.5 and add prosource as pt in on CRRT  Continue bowel regimen: colace, miralax  Continue stress ulcer prophylaxis   Monitor lfts and abdominal exam - if increasing bilirubin/alk phos and/or abdominal tenderness  will obtain gallbladder ultrasound and abdominal duplex     Endocrine:  Data/Assessment Last three Fingerstick glucose: Reviewed   Last HbA1C: NA  Most recent Thyroid results: 2.3     Diagnosis No active diagnosis   Course Stable   Plan Regular SSI every 6 hours  Goal 140-180       Hematologic:  Data/Assessment:  Reviewed    Diagnosis Anemia of chronic disease and Thrombocytopenia   Course Stable   Plan SCDs (Sequential Compression Device)  For DVT Prophylaxis  Sub q heparin  Continue to closely monitor cbc/coags      Infectious Diseases:  Data/Assessment Tmax last 24 hrs: No data recorded.       Diagnosis MRSA TV Endocarditis   Course Critical   Plan Antibiotic therapy  Agent:  Vancomycin  Indication: MRSA     repeat cultures; - follow up results    Will continue to monitor for additional s/s of infection     New non tunneled HD catheter placed 8/22 in right IJ  Femoral HD catheter removed       Family Communication and Disposition:  Data/Assessment I discussed pt's condition and plan of care in detail with pt's mother at bedside     [facility]  Nikki Dom, APRN-ACNP-BC  02/08/2021, 10:17      Critical Care Attending Attestation  I was present at the bedside of this critically ill patient.  I saw and examined the patient and discussed the patient with the CVICU team.  I agree with the current note and plan.  This patient suffers from IVDA, drug overdose, aspiration PNA requiring intubation, MRSA PNA and MRSA IE of TV w/moderate TR & bacteremia.  AKI requiring CRRT.  UDOA +heroin & methamphetamines.  S/p bronchoscopy this a.m.  Pt self-extubated this afternoon; doing well on NC.  For OR tomorrow w/OMFS for dental procedure prior to open heart sx.  Decrease precedex gtt.  C/w ABx.  The care of this patient was in regard to managing condition(s) that have a high probability of sudden, clinically significant or life-threatening deterioration and require a high degree of attending physician attention.  The data  reviewed and care planning were performed in direct proximity of the patient.  All critical care time was spent exclusive of procedures which will be documented elsewhere in the chart.  My critical care time is independent and unique to other providers.  Medications, allergies, vital signs, lab tests, imaging, nursing notes and physician notes have been reviewed.  11mn independent provider CCM time in overall assessment, review, documentation, family discussions; excludes procedures.  AJuan Quam MD  [facility] CCM      .

## 2021-02-08 NOTE — Care Plan (Signed)
Respiratory Care Services  ICU Care Plan    Patient Name:  [name] [name], [name] [name]  Date of Service:  02/08/2021  Hospital Day:     LOS: 3 days         Ventilator Settings:  Mode: SPONT  Set PEEP: 5 cmH2O  Pressure Support: 8 cmH2O  FiO2: 30 %  I:E Ratio: 1:1.2        Summary/Plan of Care:  Patient is on above vent setting and comfortable   RT will continue to monitor     [employer], RT  02/08/2021

## 2021-02-08 NOTE — Care Plan (Signed)
No acute events on this shift. CRRT still present, fluid removal goal for this shift 200. dex gtt present with prn dilaudid given. Tube feeds stopped verbal orders for possible extubation in AM. Febrile with cooling blanket on. Blood pressures stable, tachycardic ~120s.   Problem: Adult Inpatient Plan of Care  Goal: Absence of Hospital-Acquired Illness or Injury  Intervention: Identify and Manage Fall Risk  Recent Flowsheet Documentation  Taken 02/08/2021 0100 by Lovelle Lema, Randa Evens., RN  Safety Promotion/Fall Prevention: safety round/check completed     Problem: Ventilator-Induced Lung Injury (Mechanical Ventilation, Invasive)  Goal: Absence of Ventilator-Induced Lung Injury  Intervention: Prevent Ventilator-Associated Pneumonia  Recent Flowsheet Documentation  Taken 02/07/2021 2000 by Kareena Arrambide, Randa Evens., RN  Oral Care: oral care with Chlorhexidine provided     Problem: Fall Injury Risk  Goal: Absence of Fall and Fall-Related Injury  Intervention: Promote Injury-Free Environment  Recent Flowsheet Documentation  Taken 02/08/2021 0100 by Iraida Cragin, Randa Evens., RN  Safety Promotion/Fall Prevention: safety round/check completed

## 2021-02-08 NOTE — Nurses Notes (Signed)
Restraint Discontinuation    The need for the restrain is no longer present and the patient's needs can be addressed using less restrictive alternatives.

## 2021-02-08 NOTE — Consults (Signed)
[facility]  Oral and Maxillofacial Surgery  Consultation H&P    [name] [name], [name] [name] 35 y.o. female  Date of Admission:  02/05/2021  Date of Birth:  May 10, 1986    CHIEF COMPLAINT:  Needs remaining dentition ext for valve replacement    HISTORY OF PRESENT ILLNESS   [name] [name] is a 35 y.o. female with past medical history of IVDU with meth and heroin, frequent kidney stones, andhep C. Patient was hospitalized at [facility] 8/18 after being found down with suspected overdose. She required intubation due to bradypnea and lethargy as well as question for aspiration pneumonia. Patient awaiting cardiac surgery.    PAST MEDICAL HISTORY  Past Medical History:   Diagnosis Date   . 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 MRSA (methicillin resistant Staphylococcus aureus) 02/05/2021    blood           MEDICATIONS  Medications Prior to Admission     None          ALLERGIES  No Known Allergies    SURGICAL HISTORY  Not on file        FAMILY HISTORY  Family Medical History:    None           SOCIAL HISTORY  Social History     Socioeconomic History   . Marital status: Divorced     Spouse name: Not on file   . Number of children: Not on file   . Years of education: Not on file   . Highest education level: Not on file   Occupational History   . Not on file   Tobacco Use   . Smoking status: Not on file   . Smokeless tobacco: Not on file   Substance and Sexual Activity   . Alcohol use: Not on file   . Drug use: Not on file   . Sexual activity: Not on file   Other Topics Concern   . Not on file   Social History Narrative   . Not on file     Social Determinants of Health     Financial Resource Strain: Not on file   Food Insecurity: Not on file   Transportation Needs: Not on file   Physical Activity: Not on file   Stress: Not on file   Intimate Partner Violence: Not on file   Housing Stability: Not on file       REVIEW OF SYSTEMS  Other than ROS noted in HPI, all other systems are negative      PHYSICAL  EXAM  Temperature: 37.3 C (99.1 F)  Heart Rate: (!) 121  BP (Non-Invasive): (!) 127/95  Respiratory Rate: (!) 27  SpO2: 100 %  Consitutional: Pt NAD. Disoriented.  ENT: PERRL, EOMI.Marland KitchenNo malocclusion, FOM soft, Tongue NROM, CN V, VII intact, No skeletal mobility, Midline uvula, edentulous Mx, grossly carious Md dentition (#21-28 remaining)  Cardio: RRR. No murmurs/gallops/rubs.   Resp: Patient intubated  Abd: BS active. Abd soft/NTTP.  GU: Deferred  MSK: No gait disturbances/weakness.   Skin: No rashes/ulcers.  Neuro: a/o x3. No focal deficits. CN 2-12 Grossly intact  Psych: Normal mood/affect    LABS  Results for orders placed or performed during the hospital encounter of 02/05/21 (from the past 24 hour(s))   CBC/DIFF    Narrative    The following orders were created for panel order CBC/DIFF.  Procedure  Abnormality         Status                     ---------                               -----------         ------                     CBC WITH [id number]:[id number]                Abnormal            Final result               PATH COMMENT[[id number]]                 Abnormal            Final result               MANUAL DIFF AND MORPHOLO.Marland KitchenMarland KitchenYT:[id number]  Abnormal            Final result                 Please view results for these tests on the individual orders.   CBC WITH DIFF   Result Value Ref Range    WBC 17.4 (H) 3.7 - 11.0 x10^3/uL    RBC 3.33 (L) 3.85 - 5.22 x10^6/uL    HGB 8.4 (L) 11.5 - 16.0 g/dL    HCT 24.7 (L) 34.8 - 46.0 %    MCV 74.2 (L) 78.0 - 100.0 fL    MCH 25.2 (L) 26.0 - 32.0 pg    MCHC 34.0 31.0 - 35.5 g/dL    RDW-CV 21.5 (H) 11.5 - 15.5 %    PLATELETS 130 (L) 150 - 400 x10^3/uL    MPV 10.1 8.7 - 12.5 fL   BASIC METABOLIC PANEL - ONCE   Result Value Ref Range    SODIUM 133 (L) 136 - 145 mmol/L    POTASSIUM 4.5 3.5 - 5.1 mmol/L    CHLORIDE 102 96 - 111 mmol/L    CO2 TOTAL 24 22 - 30 mmol/L    ANION GAP 7 4 - 13 mmol/L    CALCIUM 7.7 (L) 8.5 - 10.0 mg/dL    GLUCOSE 127 (H) 65 -  125 mg/dL    BUN 46 (H) 8 - 25 mg/dL    CREATININE 4.05 (H) 0.60 - 1.05 mg/dL    BUN/CREA RATIO 11 6 - 22    ESTIMATED GFR 14 (L) >=60 mL/min/BSA   PHOSPHORUS   Result Value Ref Range    PHOSPHORUS 3.7 2.4 - 4.7 mg/dL   MANUAL DIFF AND MORPHOLOGY-SYSMEX   Result Value Ref Range    NEUTROPHIL % 92 %    LYMPHOCYTE %  3 %    MONOCYTE % 3 %    EOSINOPHIL % 0 %    BASOPHIL % 0 %    NEUTROPHIL BANDS % 2 %    METAMYELOCYTE %  1 %    NEUTROPHIL # 16.36 (H) 1.50 - 7.70 x10^3/uL    LYMPHOCYTE # 0.52 (L) 1.00 - 4.80 x10^3/uL    MONOCYTE # 0.52 0.20 - 1.10 x10^3/uL    EOSINOPHIL # <0.10 <=0.50 x10^3/uL    BASOPHIL # <0.10 <=0.20 x10^3/uL    ANISOCYTOSIS 2+/Moderate (A) None    HYPOCHROMASIA 2+/Moderate (A)  None   PATH COMMENT   Result Value Ref Range    PATHOLOGIST INTERPRETATION Abnormal Study, See Interpretation (A) Normal Study   CBC - AM ONCE   Result Value Ref Range    WBC 21.1 (H) 3.7 - 11.0 x10^3/uL    RBC 3.16 (L) 3.85 - 5.22 x10^6/uL    HGB 8.0 (L) 11.5 - 16.0 g/dL    HCT 23.7 (L) 34.8 - 46.0 %    MCV 75.0 (L) 78.0 - 100.0 fL    MCH 25.3 (L) 26.0 - 32.0 pg    MCHC 33.8 31.0 - 35.5 g/dL    RDW-CV 21.8 (H) 11.5 - 15.5 %    PLATELETS 137 (L) 150 - 400 x10^3/uL    MPV 10.5 8.7 - 12.5 fL   BASIC METABOLIC PANEL   Result Value Ref Range    SODIUM 134 (L) 136 - 145 mmol/L    POTASSIUM 4.5 3.5 - 5.1 mmol/L    CHLORIDE 105 96 - 111 mmol/L    CO2 TOTAL 25 22 - 30 mmol/L    ANION GAP 4 4 - 13 mmol/L    CALCIUM 7.1 (L) 8.5 - 10.0 mg/dL    GLUCOSE 113 65 - 125 mg/dL    BUN 34 (H) 8 - 25 mg/dL    CREATININE 2.57 (H) 0.60 - 1.05 mg/dL    BUN/CREA RATIO 13 6 - 22    ESTIMATED GFR 24 (L) >=60 mL/min/BSA   PHOSPHORUS   Result Value Ref Range    PHOSPHORUS 2.8 2.4 - 4.7 mg/dL   MAGNESIUM   Result Value Ref Range    MAGNESIUM 2.3 1.8 - 2.6 mg/dL   VENOUS BLOOD GAS WITH LACTATE REFLEX   Result Value Ref Range    %FIO2 (VENOUS) 35.0 %    PH (VENOUS) 7.43 (H) 7.31 - 7.41    PCO2 (VENOUS) 42 41 - 51 mm/Hg    PO2 (VENOUS) 34 (L) 35 - 50 mm/Hg     BASE EXCESS 3.2 (H) -3.0 - 3.0 mmol/L    BICARBONATE (VENOUS) 26.6 (H) 22.0 - 26.0 mmol/L    LACTATE 0.7 0.0 - 1.3 mmol/L   CBC/DIFF    Narrative    The following orders were created for panel order CBC/DIFF.  Procedure                               Abnormality         Status                     ---------                               -----------         ------                     CBC WITH [id number]                Abnormal            Preliminary result           Please view results for these tests on the individual orders.   CBC WITH DIFF   Result Value Ref Range    WBC 20.1 (H) 3.7 - 11.0 x10^3/uL    RBC 3.31 (L) 3.85 - 5.22 x10^6/uL    HGB 8.2 (L)  11.5 - 16.0 g/dL    HCT 24.9 (L) 34.8 - 46.0 %    MCV 75.2 (L) 78.0 - 100.0 fL    MCH 24.8 (L) 26.0 - 32.0 pg    MCHC 32.9 31.0 - 35.5 g/dL    RDW-CV 21.7 (H) 11.5 - 15.5 %    PLATELETS 148 (L) 150 - 400 x10^3/uL    MPV 10.6 8.7 - 12.5 fL   POC BLOOD GLUCOSE (RESULTS)   Result Value Ref Range    GLUCOSE, POC 110 (H) 70 - 105 mg/dl   POC BLOOD GLUCOSE (RESULTS)   Result Value Ref Range    GLUCOSE, POC 108 (H) 70 - 105 mg/dl   POC BLOOD GLUCOSE (RESULTS)   Result Value Ref Range    GLUCOSE, POC 104 70 - 105 mg/dl   POC BLOOD GLUCOSE (RESULTS)   Result Value Ref Range    GLUCOSE, POC 101 70 - 105 mg/dl   POC BLOOD GLUCOSE (RESULTS)   Result Value Ref Range    GLUCOSE, POC 114 (H) 70 - 105 mg/dl   POC BLOOD GLUCOSE (RESULTS)   Result Value Ref Range    GLUCOSE, POC 98 70 - 105 mg/dl       RADIOLOGY RESULTS   Results for orders placed or performed during the hospital encounter of 02/05/21 (from the past 72 hour(s))   CT BRAIN WO IV CONTRAST     Status: None    Narrative    [place]  Female, 35 years old.    CT BRAIN WO IV CONTRAST performed on 02/05/2021 11:28 AM.    REASON FOR EXAM:  altered mental status  RADIATION DOSE: 1220.40 mGycm    COMPARISON: None.    TECHNIQUE: Axial CT images were performed from the vertex through the skull base without administration  of contrast. Sagittal and coronal reformats were also submitted for review.    FINDINGS:    The ventricles are normal in configuration, size and symmetry. The gray-white interface is grossly preserved. There is no evidence of acute hemorrhage, abnormal fluid collection, mass effect or midline shift. There is partial left mastoid effusion with fluid seen layering in the inferior most mastoid cells. The visualized paranasal sinuses and mastoid air cells are otherwise clear. There is no evidence of acute fracture or suspicious osseous lesion.      Impression    No evidence of acute intracranial process.   CT FACIAL BONES WO IV CONTRAST     Status: None    Narrative    Lauran Sachs  Female, 34 years old.    CT FACIAL BONES WO IV CONTRAST performed on 02/05/2021 11:28 AM.    REASON FOR EXAM:  Preop    RADIATION DOSE: 614.50 mGycm    COMPARISON: Concurrent CT brain    TECHNIQUE: Axial CT images were obtained from the skull base through the hyoid without administration of intravenous contrast. Sagittal and coronal reformats were also submitted for review.    FINDINGS:    The tracheostomy and enteric tubes are partially visualized. The maxilla is edentulous. There is periapical lucency surrounding the right mandibular first premolar root fragments with anterior cortical breakthrough. Periapical lucency is also noted at the left first premolar.. There are multiple dental caries. No definitive loculated soft tissue fluid collection to suggest abscess within the limitations of noncontrast examination. There is a small right maxillary mucous retention cyst. The paranasal sinuses are otherwise clear.      Impression    Multiple mandibular  dental caries with periapical lucencies surrounding the root fragments of the first premolars. Dental follow-up recommended.   CT CHEST ABDOMEN PELVIS WO IV CONTRAST     Status: None    Narrative    Dulcemaria He  Female, 35 years old.    CT CHEST ABDOMEN PELVIS WO IV CONTRAST performed on  02/05/2021 11:29 AM.    REASON FOR EXAM:  hypoxia    Patient has a history of IV drug use and presented on 01/31/2021 and outside hospital with drug overdose, aspiration pneumonia and endocarditis.    RADIATION DOSE: 817.00 mGycm    COMPARISON: None    FINDINGS:   CHEST:  LUNGS: Bilateral patchy airspace opacities are noted, some of which demonstrate cavitation and are consistent with septic emboli. Airspace opacity is noted at the lung bases.    HEART/MEDIASTINUM: Cardiac size is at the upper limits of normal. Trace pericardial effusion is noted. The thyroid is symmetric.    PLEURA: No effusion    LYMPH NODES: No adenopathy    BONES/SOFT TISSUES: Soft tissues are normal. Vertebral body height is maintained.    ABDOMEN/PELVIS:   LIVER: The liver is enlarged but normal in attenuation    BILIARY SYSTEM/GALLBLADDER: The gallbladder is distended. No bile duct dilation.    PANCREAS: Grossly normal    KIDNEY/URETERS: No hydronephrosis. No renal calculi.    ADRENALS: No adrenal mass    SPLEEN: Spleen is enlarged    BOWEL: No abnormally distended loops of bowel are identified to suggest obstruction. Moderate volume fecal retention is noted.    VASCULATURE/LYMPH NODES: No definite adenopathy. Right-sided femoral central line is noted.    BLADDER: Bladder is decompressed. Punctate focus of air is likely from previous catheter placement.    REPRODUCTIVE ORGANS: Not identified    PERITONEAL CAVITY: Trace ascites is noted in the pelvis.    BONES/SOFT TISSUES: There is mild anasarca. Vertebral body height is maintained.      Impression    1. Bilateral airspace opacities, some of which are cavitary likely representing septic emboli. More confluent airspace opacity is noted dependently at both lung bases and may represent a component of aspiration pneumonia.  2. Hepatosplenomegaly.  3. Trace ascites in the pelvis and mild anasarca.     XR ABD X-RAY CHECK DOBHOFF PLACEMENT     Status: Abnormal    Narrative    Bobie Jayne  Female, 35  years old.    XR ABD X-RAY CHECK DOBHOFF PLACEMENT performed on 02/05/2021 11:28 PM.    REASON FOR EXAM:  OG placement    TECHNIQUE: 1 views/2 images submitted for interpretation.    COMPARISON:  CT chest abdomen and pelvis 02/05/2021      Impression    Enteric tube across the diaphragm with tip overlying the distal body of the stomach. Side-port is 9 cm caudal to the diaphragmatic hiatus. The esophageal temperature probe also crosses the diaphragm with tip projecting over the upper sacrum. Subsequent radiograph demonstrates significant retraction of the esophageal temperature probe.   XR ABD X-RAY CHECK DOBHOFF PLACEMENT     Status: None    Narrative    Malka Shasteen  Female, 35 years old.    XR ABD X-RAY CHECK DOBHOFF PLACEMENT performed on 02/05/2021 11:53 PM.    REASON FOR EXAM:  OG readjusted    TECHNIQUE: 1 views/1 images submitted for interpretation.    COMPARISON:  Abdominal radiograph 02/05/2021    FINDINGS:  Enteric tube is seen with tip overlying the distal  gastric body and side port 9 cm below the GE junction. Esophageal temperature probe has been retracted since prior exam, with tip now overlying the expected location of the GE junction. Visualized bowel gas pattern is nonobstructive, with nondilated loops of bowel. The included lung bases are clear.      Impression    1.Appropriately positioned enteric tube with tip in the gastric body.  2.Interval retraction of esophageal temperature probe with tip at the level of the GE junction.   XR AP MOBILE CHEST     Status: None    Narrative    XR AP MOBILE CHEST performed on 02/06/2021 12:49 AM    INDICATION: 35 years old Female; cvc placement    TECHNIQUE: 1 views of the chest; 1 images    COMPARISON: X-ray chest AP 2020 2022    SUPPORT DEVICES: Interval placement of the right IJ central venous catheter, tip projecting over the right atrium. Endotracheal tube, enteric tube and esophageal temperature probe are in place.    FINDINGS:  Multifocal cavitary lesions in  bilateral lung fields with small bilateral pleural effusion. Cardiac size is within normal limit without pulmonary edema. No pneumothorax.      Impression    Interval placement of the right IJ central venous catheter, tip projecting over right atrium. No pneumothorax.   XR AP MOBILE CHEST     Status: None    Narrative    Emmanuel Eldridge  Female, 35 years old.    XR AP MOBILE CHEST performed on 02/06/2021 5:56 AM.    REASON FOR EXAM:  ETT, endocarditis    TECHNIQUE: 1 views/1 images submitted for interpretation.    COMPARISON:  Same date at 12:38 AM      Impression    There are stable support devices with esophageal temperature probe tip at the level of the gastroesophageal junction. Slight retraction is recommended. Mixed interstitial and airspace opacities and cavitary opacities are redemonstrated. No visible pneumothorax.   MRI BRAIN WO CONTRAST     Status: None    Narrative    Tonnya Grainger  Female, 35 years old.    MRI BRAIN WO CONTRAST performed on 02/06/2021 11:29 PM.    REASON FOR EXAM:  Encephalopathy    TECHNIQUE: Sagittal T1, axial FLAIR, coronal FLAIR axial TSE T2, axial diffusion, axial T1, axial GRE, axial T2, coronal T2, and coronal 3-D RAGE.    COMPARISON: CT facial bone from 02/05/2021. CT brain from 02/05/2021.    FINDINGS:  The diffusion sequences are negative for an acute infarct. There is no abnormal brain tissue staining by blood breakdown products on blood sensitive sequences. No extra-axial fluid collection identified. There is no midline shift, herniation or hydrocephalus.    There is no masslike brain lesion. Although, evaluation is limited due to lack of IV contrast.    Major proximal intracranial flow voids are preserved. Bilateral mastoid effusions.        Impression    1.No acute intracranial abnormality.  2.Bilateral mastoid effusions.   XR AP MOBILE CHEST     Status: None    Narrative    Earl Iqbal  Female, 35 years old.    XR AP MOBILE CHEST performed on 02/07/2021 5:16 AM.    REASON FOR  EXAM:  Respiratory Failure    TECHNIQUE: 1 views/1 images submitted for interpretation.    COMPARISON:  February 06, 2021    FINDINGS:  Esophageal temperature probe is noted projecting over the lower cervical spine. Other lines and  support devices are unchanged in position. Minor airspace opacities are again noted, unchanged. No lobar consolidation, pleural effusions or pneumothorax. Cardiac size and central pulmonary vasculature within normal limits. Visualized osseous structures are unremarkable.      Impression    Minor air space opacities unchanged in position. No lobar consolidation or pleural effusions.   XR AP MOBILE CHEST     Status: None    Narrative    Jakyrah Mans  Female, 35 years old.    XR AP MOBILE CHEST performed on 02/08/2021 5:20 AM.    REASON FOR EXAM:  ETT    TECHNIQUE: 1 views/1 images submitted for interpretation.    COMPARISON:  Chest x-ray from 02/07/2021.    FINDINGS: Subtle patchy bilateral airspace opacities are noted. No lobar   consolidation, pleural effusions or pneumothorax. Cardiac size and central   pulmonary vasculature within normal limits. Visualized osseous structures   appear intact.    Endotracheal tube tip projects approximately 3.3 cm above the carina.  Enteric tube courses towards the left upper quadrant, tip not included.  Esophageal probe tip projects over the cervical esophagus.  Right IJ catheter tip projects over the expected area of the right atrium.        Impression    1.Subtle patchy bilateral airspace opacities are unchanged. No new   cardiopulmonary process.  2.Esophageal probe tip projects over the cervical esophagus. Repositioning   is recommended. Other support devices as above.       ASSESSMENT/PLAN:  35 y.o. female presenting with past medical history of IVDU, frequent kidney stones, and hep C. Patient is currently intubated and awaiting CT surgery. OMFS consultation required for dental clearance. Patient requires EXT of remaining dentition (#21-28). Patient will  be seen as an add on in [facility] OR, per anesthesia approval, on Friday 8/26 after 11:00. Patient will have remaining dentition extracted.    - Patient will be seen in OR on 02/09/21, per anesthesia approval, in 5N for Ext of remaining dentition.  - NPO after midnight  - MPOA consent received  - Please page resident on call with any questions      Chales Abrahams, DDS 02/08/2021, 11:15

## 2021-02-08 NOTE — Procedures (Signed)
Bronchoscopy/Awake Patient       Procedure Date:  02/08/2021 Time:  0900  Procedure: Bronchoscopy  Diagnosis:  endocarditis  Indication:  Increased secretions    Description: After informed consent the patient was positioned. A surgical time out was performed to confirm correct patient and procedure. Conscious sedation and sterile conditions were maintained for the procedure. The patient underwent fiber-optic bronchoscopy through the endotracheal tube. The vocal cords were of normal appearance and functioned normally. The trachea was normal in appearance and the carina was sharp. All airways were inspected to the level of segmental bronchi. The airways were normal and lobar and segmental bronchi were patent. The no endobronchial lesions or other anatomical abnormality. A small amount of clear secretions were encountered and aspirated as completely as possible. There was no bleeding identified.     The bronchoscopy was performed without flouro. The samples were sent to the lab for cell counts, cultures and cytology.     The bronchoscope was removed at the conclusion of the examination.  Patient tolerated procedure without complications.     Noberto Retort, APRN-ACNP-BC 02/08/2021, 09:37        Lindalou Hose, MD  [facility] CCM

## 2021-02-08 NOTE — Pharmacy (Signed)
[place] / [facility] of Pharm[facility]  Therapeutic Drug Monitoring: Vancomycin  02/08/2021      Patient name: [name] [name], [name] [name]  Date of Birth:  May 01, 1986    Actual Weight:  Weight: 72.4 kg (159 lb 9.8 oz) (02/05/21 0100)     BMI:  BMI (Calculated): 26.62 (02/05/21 0100)      Date RPh Current regimen (including mg/kg) Indication &  Organism AUC or trough based dosing Target Levels^ SCr (mg/dL) CrCl* (mL/min) Infectious Laboratory Markers (as applicable)   Measured level(s)   (mcg/mL) Calculated AUC (if AUC based monitoring) Plan & predicted AUC/trough if initial dosing (including when levels are due) Comments   8/22 [facility] intermittent dosing at outside hospital with dialysis endocarditis trough 13-17 6.21 12 WBC:11.3  Procal:  CRP: Vanc random 24.4  No Vanc at this time as the level is 24.4 - will proceed with intermittent dosing- dialysis plan unclear at this time - will clarify in the morning to determine further dosing/levels    8/23 [facility] Intermittent Dosing As above As above 13-17 CVVHD CVVHD    - Start vancomycin 1000 mg IV q24h while patient is on CRRT  - If CRRT is stopped, discontinue scheduled dose and dose by levels    8/25 [facility] Vancomycin 1000 mg IV q24h As above As above 13-17 CVVHD CVVHD  16h level = 19.4  - Continue current dosing  - Obtain trough on 8/26                                                                      ^Target levels depends on dosing and monitoring method, AUC vs. trough based. For AUC based dosing units are mg*h/L. For trough based dosing units are mcg/mL.     *Creatinine clearance is estimated by using the Cockcroft-Gault equation for adult patients and the Carol Ada for pediatric patients.    The decision to discontinue vancomycin therapy will be determined by the primary service.  Please contact the pharmacist with any questions regarding this patient's medication regimen.

## 2021-02-08 NOTE — Respiratory Therapy (Signed)
VENTILATOR - CPAP(PS) / SPONTANEOUS  CONTINUOUS     Discontinue     Duration: Until Specified    Priority: Routine          Question Answer Comment   FIO2 (%) 30    Peep(cm/H2O) 5    Pressure Support(cm/H2O) 8    Indications IMPROVE DISTRIBUTION OF VENTILATION    Invasive/Non-Invasive INVASIVE

## 2021-02-08 NOTE — Nurses Notes (Signed)
Restraint Continuation      Patient continues to have the following condition: Risk for self harm by means of self removal of critical vascular access, advanced airway, and lines/drains. We will continuously monitor for resolved need for restraint.        Least restrictive alternatives attempted : Frequent reorientation, heightened observation, explanation of condition.      Patient continues to exhibit the following behaviors: uncooperative and agitated    The restraint continued to facilitate medical/surgical treatment to ensure safety.    The patient will continue to be evaluated and assessments documented on the flowsheet to ensure that the patient is released from the restraint at the earliest possible time.     Myer Haff, RN  02/08/2021, 0800

## 2021-02-08 NOTE — Consults (Signed)
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  She will respond to questions with head nods.  She indicated that she was having diffuse neck and back pain.    Objective:  Physical Exam:  Current Vitals: BP (!) 120/91   Pulse (!) 120   Temp 36.9 C (98.4 F)   Resp (!) 28   Ht 1.651 m (_0 )   Wt 72.4 kg (159 lb 9.8 oz)   SpO2 100%   BMI 26.56 kg/m     Vitals in last 24 hours: Temp  Avg: 37.7 C (99.8 F)  Min: 36.4 C (97.5 F)  Max: 38.5 C (101.3 F)  MAP (Non-Invasive)  Avg: 94.8 mmHG  Min: 75 mmHG  Max: 155 mmHG  Pulse  Avg: 124.8  Min: 113  Max: 137  Resp  Avg: 28.9  Min: 16  Max: 38  SpO2  Avg: 99.5 %  Min: 97 %  Max: 100 %  General:  Ill appearing.  Mechanically ventilated and sedated.  Eyes: Pupils equal round. No conjunctival injection or scleral icterus.  ENT: Face symmetrical.  ET tube in place.  No mouth lesions.  Poor dentition.  Neck: Supple and symmetrical.   Right IJ in place.  Lungs: Clear to auscultation bilaterally to anterior and axillary lung fields on MV.   Cardiovascular:  Tachycardic.  Regular rhythm.  No murmur.  Abdomen:  BS+.  Slightly distended.  prior. Soft and easily depressible.  Extremities: No joint erythema or swelling.  No cyanosis or edema.  Right AC with track marks.  Right groin with bandage. No erythema, drainage, or induration around site.  Bilateral upper extremities with edema.  Skin: Warm and dry.  No rash or lesions.  Neurologic:   intermittently nods to questions.  Psychiatric:  Unable to assess.     Inpatient Medications:  acetaminophen (TYLENOL) tablet, 650 mg, Oral, Q4H PRN  B complex-vitamin C-folic acid (NEPHRONEX) oral liquid, 5 mL, Gastric (NG, OG, PEG, GT),  Daily  budesonide (PULMICORT RESPULES) 0.5 mg/2 mL nebulizer suspension, 1 mg, Nebulization, 2x/day  chlorhexidine gluconate (PERIDEX) 0.12% mouthwash, 15 mL, Swish & Spit, 2x/day  dexmedeTOMIDine (PRECEDEX) 400 mcg in NS 100 mL (tot vol) infusion, 0.2 mcg/kg/hr (Adjusted), Intravenous, Continuous  docusate sodium (COLACE) 26m per mL oral liquid, 100 mg, Gastric (NG, OG, PEG, GT), 2x/day  famotidine (PEPCID) tablet, 10 mg, Gastric (NG, OG, PEG, GT), Daily  heparin 5,000 unit/mL injection, 5,000 Units, Subcutaneous, Q8HRS  HYDROmorphone (DILAUDID) 1 mg/mL injection, 0.3 mg, Intravenous, Q3H PRN  levalbuterol (XOPENEX) 0.63 mg/ 3 mL nebulizer solution, 0.63 mg, Nebulization, 3x/day  miconazole nitrate (SECURA) 2% topical cream, , Apply Topically, 2x/day  NS flush syringe, 2-6 mL, Intracatheter, Q8HRS   And  NS flush syringe, 2-6 mL, Intracatheter, Q1 MIN PRN  nutrition protein supplement 15 g per 30 mL packet, 2 Packet, Gastric (NG, OG, PEG, GT), Daily  polyethylene glycol (MIRALAX) oral packet, 17 g, Gastric (NG, OG, PEG, GT), Daily  prismaSATE (bicarbonate-based) 4K-2.5Ca dialysate premix, , Hemodialysis, Dialysis Cont.  QUEtiapine (SEROQUEL) tablet, 50 mg, Gastric (NG, OG, PEG, GT), 2x/day  sodium chloride 3% nebulizer solution, 3 mL, Nebulization, Q12H  sodium citrate 4% (3 mL) injection syringe,  2 Syringe, Intracatheter, Q1H PRN  SSIP insulin lispro 100 units/mL injection, 0-12 Units, Subcutaneous, Q4H PRN  vancomycin (VANCOCIN) 1 g in D5W 200 mL premix IVPB, 15 mg/kg (Adjusted), Intravenous, Q24H  Vancomycin IV - Pharmacist to Dose per Protocol, , Does not apply, Daily PRN        Current Antimicrobials:  Antibiotics (From admission, onward)    Start     Stop Route Frequency    02/05/21 0226  Vancomycin IV Intermittent Dosing         -- N/A DAILY PRN           Lines:  Patient Lines/Drains/Airways Status     Active Line / Dialysis Catheter / Dialysis Graft / Drain / Airway / Wound     Name Placement date  Placement time Site Days    Peripheral IV Right Basilic  (medial side of arm) 02/04/21  2000  -- 3    Dialysis Catheter Trialysis Catheter 02/06/21  0000  -- 2    Oral Gastric Tube 02/05/21  2300  -- 2    EndoTracheal Tube Cuffed 7.5 02/06/21  2001  -- 1    Wound (Non-Surgical) Anterior;Right Elbow 02/05/21  0045  -- 3                 Laboratory Studies:  CBC Differential   Recent Labs     02/07/21  0026 02/07/21  1036 02/08/21  0100   WBC 15.1* 17.4* 21.1*   HGB 7.9* 8.4* 8.0*   HCT 23.4* 24.7* 23.7*   PLTCNT 99* 130* 137*   BANDS  --  2  --     Recent Labs     02/07/21  1036   PMNS 92   LYMPHOCYTES 3   MONOCYTES 3   EOSINOPHIL 0   BASOPHILS 0  <0.10   PMNABS 16.36*   MONOSABS 0.52   EOSABS <0.10      BMP LFTs   Recent Labs     02/07/21  1036 02/08/21  0100   SODIUM 133* 134*   POTASSIUM 4.5 4.5   CHLORIDE 102 105   CO2 24 25   BUN 46* 34*   CREATININE 4.05* 2.57*   GLUCOSENF 127* 113   ANIONGAP 7 4   BUNCRRATIO 11 13   GFR 14* 24*   CALCIUM 7.7* 7.1*   MAGNESIUM  --  2.3   PHOSPHORUS 3.7 2.8    No results found for this encounter   CoAgs Blood Gas:   No results found for this encounter Recent Labs     02/08/21  0100   FI02 35.0   PH 7.43*   PCO2 42   PO2 34*   BICARBONATE 26.6*   BASEEXCESS 3.2*       Cardiac Markers Lipid Panel   No results for input(s): TROPONINI, CKMB, MBINDEX, BNP in the last 72 hours. No results found for this encounter   Urine Analysis Other Labs   No results found for this encounter No results found for this encounter    Invalid input(s): PRL     Microbiology:  Hospital Encounter on 02/05/21 (from the past 96 hour(s))   BRONCHOALVEOLAR LAVAGE (BAL) QUANTITATIVE CULTURE/GRAM STAIN    Collection Time: 02/05/21 10:19 AM    Specimen: Bronchoalveolar Lavage; Other   Culture Result Status    BRONCHOALVEOLAR LAVAGE (BAL) QUANTITATIVE CULTURE 15000 Methicillin Resistant Staphylococcus aureus (A) Final    BRONCHOALVEOLAR LAVAGE (BAL) QUANTITATIVE CULTURE 2000 Normal Oral Flora Final  GRAM STAIN  3+ Several PMNs Final    GRAM STAIN No Organisms Seen Final       Susceptibility    Methicillin Resistant Staphylococcus aureus - MIC SUSCEPTIBILITY     Oxacillin >=4 Resistant mcg/mL     Gentamicin* <=0.5 Sensitive mcg/mL      * The role of gentamicin for the treatment of MSSA and MRSA infections is in combination with a cell-wall acting agent in the setting of prosthetic valve endocarditis.     Erythromycin >=8 Resistant mcg/mL     Clindamycin 0.25 Sensitive mcg/mL     Tetracycline <=1 Sensitive mcg/mL     Doxycycline <=0.5 Sensitive mcg/mL     Linezolid 2 Sensitive mcg/mL     Vancomycin 1 Sensitive mcg/mL     Tigecycline <=0.12 Sensitive mcg/mL     Rifampin* <=0.5 Sensitive mcg/mL      * Rifampin should not be used alone for the treatment of Staphylococcus due to the quick emergence of resistance. For use in combination with other active agents for prosthetic material biofilm penetration.      Trimethoprim/Sulfamethoxazole <=10 Sensitive mcg/mL   ADULT ROUTINE BLOOD CULTURE, SET OF 2 ADULT BOTTLES (BACTERIA AND YEAST)    Collection Time: 02/05/21  9:55 PM    Specimen: Blood   Culture Result Status    BLOOD CULTURE, ROUTINE Abnormal Stain (AA) Preliminary    BLOOD CULTURE, ROUTINE Methicillin Resistant Staphylococcus aureus (A) Preliminary    GRAM STAIN Gram Positive Cocci/Clusters (A) Preliminary       Susceptibility    Methicillin Resistant Staphylococcus aureus - MIC SUSCEPTIBILITY     Oxacillin >=4 Resistant mcg/mL     Gentamicin* <=0.5 Sensitive mcg/mL      * The role of gentamicin for the treatment of MSSA and MRSA infections is in combination with a cell-wall acting agent in the setting of prosthetic valve endocarditis.     Erythromycin >=8 Resistant mcg/mL     Clindamycin 0.25 Sensitive mcg/mL     Daptomycin 0.5 Sensitive mcg/mL     Tetracycline <=1 Sensitive mcg/mL     Doxycycline <=0.5 Sensitive mcg/mL     Linezolid 2 Sensitive mcg/mL     Vancomycin 1 Sensitive mcg/mL     Rifampin* <=0.5 Sensitive mcg/mL       * Rifampin should not be used alone for the treatment of Staphylococcus due to the quick emergence of resistance. For use in combination with other active agents for prosthetic material biofilm penetration.      Trimethoprim/Sulfamethoxazole <=10 Sensitive mcg/mL   ADULT ROUTINE BLOOD CULTURE, SET OF 2 ADULT BOTTLES (BACTERIA AND YEAST)    Collection Time: 02/05/21  9:55 PM    Specimen: Blood   Culture Result Status    BLOOD CULTURE, ROUTINE Abnormal Stain (AA) Preliminary    BLOOD CULTURE, ROUTINE Methicillin Resistant Staphylococcus aureus (A) Preliminary    GRAM STAIN Gram Positive Cocci/Clusters (A) Preliminary   MRSA/MSSA MOLECULAR SCREEN, BLOOD    Collection Time: 02/05/21  9:55 PM    Specimen: Blood   Culture Result Status    METHICILLIN RESISTANCE Positive (A) Final    STAPHYLOCOCCUS AUREUS Positive (A) Final    Narrative    INTERPRETATION: Methicillin Resistant S. aureus (MRSA)     Preliminary molecular testing has detected genetic markers for Staph aureus (spa) and methicillin resistance (mecA and SCCmec).  Standard isolate identification and antimicrobial susceptibility testing results are forthcoming.     Methicillin resistance/susceptibility in organisms other than Staphylococcus aureus CANNOT be determined  by this assay.    Performed by PCR using the MRSA/SA Blood Culture Assay and the GeneXpert system. Test performance on this bottle type verified at [facility] using an FDA cleared assay. Results are preliminary and should be interpreted within the clinical context.   ADULT ROUTINE BLOOD CULTURE, SET OF 2 ADULT BOTTLES (BACTERIA AND YEAST)    Collection Time: 02/07/21  5:15 AM    Specimen: Blood   Culture Result Status    BLOOD CULTURE, ROUTINE Abnormal Stain (AA) Preliminary    GRAM STAIN Gram Positive Cocci/Clusters (A) Preliminary    GRAM STAIN Gram Positive Cocci/Clusters (A) Preliminary   ADULT ROUTINE BLOOD CULTURE, SET OF 2 ADULT BOTTLES (BACTERIA AND YEAST)    Collection Time: 02/07/21  5:15  AM    Specimen: Blood   Culture Result Status    BLOOD CULTURE, ROUTINE Abnormal Stain (AA) Preliminary    GRAM STAIN Gram Positive Cocci/Clusters (A) Preliminary    GRAM STAIN Gram Positive Cocci/Clusters (A) Preliminary       No results found for any visits on 02/05/21 (from the past 24 hour(s)).    Imaging Studies:  Results for orders placed or performed during the hospital encounter of 02/05/21 (from the past 72 hour(s))   CT BRAIN WO IV CONTRAST     Status: None    Narrative    Tangerine  Female, 35 years old.    CT BRAIN WO IV CONTRAST performed on 02/05/2021 11:28 AM.    REASON FOR EXAM:  altered mental status  RADIATION DOSE: 1220.40 mGycm    COMPARISON: None.    TECHNIQUE: Axial CT images were performed from the vertex through the skull base without administration of contrast. Sagittal and coronal reformats were also submitted for review.    FINDINGS:    The ventricles are normal in configuration, size and symmetry. The gray-white interface is grossly preserved. There is no evidence of acute hemorrhage, abnormal fluid collection, mass effect or midline shift. There is partial left mastoid effusion with fluid seen layering in the inferior most mastoid cells. The visualized paranasal sinuses and mastoid air cells are otherwise clear. There is no evidence of acute fracture or suspicious osseous lesion.      Impression    No evidence of acute intracranial process.   CT FACIAL BONES WO IV CONTRAST     Status: None    Narrative    Jamilett Corallo  Female, 35 years old.    CT FACIAL BONES WO IV CONTRAST performed on 02/05/2021 11:28 AM.    REASON FOR EXAM:  Preop    RADIATION DOSE: 614.50 mGycm    COMPARISON: Concurrent CT brain    TECHNIQUE: Axial CT images were obtained from the skull base through the hyoid without administration of intravenous contrast. Sagittal and coronal reformats were also submitted for review.    FINDINGS:    The tracheostomy and enteric tubes are partially visualized. The maxilla is  edentulous. There is periapical lucency surrounding the right mandibular first premolar root fragments with anterior cortical breakthrough. Periapical lucency is also noted at the left first premolar.. There are multiple dental caries. No definitive loculated soft tissue fluid collection to suggest abscess within the limitations of noncontrast examination. There is a small right maxillary mucous retention cyst. The paranasal sinuses are otherwise clear.      Impression    Multiple mandibular dental caries with periapical lucencies surrounding the root fragments of the first premolars. Dental follow-up recommended.   CT CHEST ABDOMEN  PELVIS WO IV CONTRAST     Status: None    Narrative    Riata Vasko  Female, 35 years old.    CT CHEST ABDOMEN PELVIS WO IV CONTRAST performed on 02/05/2021 11:29 AM.    REASON FOR EXAM:  hypoxia    Patient has a history of IV drug use and presented on 01/31/2021 and outside hospital with drug overdose, aspiration pneumonia and endocarditis.    RADIATION DOSE: 817.00 mGycm    COMPARISON: None    FINDINGS:   CHEST:  LUNGS: Bilateral patchy airspace opacities are noted, some of which demonstrate cavitation and are consistent with septic emboli. Airspace opacity is noted at the lung bases.    HEART/MEDIASTINUM: Cardiac size is at the upper limits of normal. Trace pericardial effusion is noted. The thyroid is symmetric.    PLEURA: No effusion    LYMPH NODES: No adenopathy    BONES/SOFT TISSUES: Soft tissues are normal. Vertebral body height is maintained.    ABDOMEN/PELVIS:   LIVER: The liver is enlarged but normal in attenuation    BILIARY SYSTEM/GALLBLADDER: The gallbladder is distended. No bile duct dilation.    PANCREAS: Grossly normal    KIDNEY/URETERS: No hydronephrosis. No renal calculi.    ADRENALS: No adrenal mass    SPLEEN: Spleen is enlarged    BOWEL: No abnormally distended loops of bowel are identified to suggest obstruction. Moderate volume fecal retention is  noted.    VASCULATURE/LYMPH NODES: No definite adenopathy. Right-sided femoral central line is noted.    BLADDER: Bladder is decompressed. Punctate focus of air is likely from previous catheter placement.    REPRODUCTIVE ORGANS: Not identified    PERITONEAL CAVITY: Trace ascites is noted in the pelvis.    BONES/SOFT TISSUES: There is mild anasarca. Vertebral body height is maintained.      Impression    1. Bilateral airspace opacities, some of which are cavitary likely representing septic emboli. More confluent airspace opacity is noted dependently at both lung bases and may represent a component of aspiration pneumonia.  2. Hepatosplenomegaly.  3. Trace ascites in the pelvis and mild anasarca.     XR ABD X-RAY CHECK DOBHOFF PLACEMENT     Status: Abnormal    Narrative    Gurnoor Andis  Female, 35 years old.    XR ABD X-RAY CHECK DOBHOFF PLACEMENT performed on 02/05/2021 11:28 PM.    REASON FOR EXAM:  OG placement    TECHNIQUE: 1 views/2 images submitted for interpretation.    COMPARISON:  CT chest abdomen and pelvis 02/05/2021      Impression    Enteric tube across the diaphragm with tip overlying the distal body of the stomach. Side-port is 9 cm caudal to the diaphragmatic hiatus. The esophageal temperature probe also crosses the diaphragm with tip projecting over the upper sacrum. Subsequent radiograph demonstrates significant retraction of the esophageal temperature probe.   XR ABD X-RAY CHECK DOBHOFF PLACEMENT     Status: None    Narrative    Netanya Coppin  Female, 35 years old.    XR ABD X-RAY CHECK DOBHOFF PLACEMENT performed on 02/05/2021 11:53 PM.    REASON FOR EXAM:  OG readjusted    TECHNIQUE: 1 views/1 images submitted for interpretation.    COMPARISON:  Abdominal radiograph 02/05/2021    FINDINGS:  Enteric tube is seen with tip overlying the distal gastric body and side port 9 cm below the GE junction. Esophageal temperature probe has been retracted since prior exam, with tip  now overlying the expected  location of the GE junction. Visualized bowel gas pattern is nonobstructive, with nondilated loops of bowel. The included lung bases are clear.      Impression    1.Appropriately positioned enteric tube with tip in the gastric body.  2.Interval retraction of esophageal temperature probe with tip at the level of the GE junction.   XR AP MOBILE CHEST     Status: None    Narrative    XR AP MOBILE CHEST performed on 02/06/2021 12:49 AM    INDICATION: 35 years old Female; cvc placement    TECHNIQUE: 1 views of the chest; 1 images    COMPARISON: X-ray chest AP 2020 2022    SUPPORT DEVICES: Interval placement of the right IJ central venous catheter, tip projecting over the right atrium. Endotracheal tube, enteric tube and esophageal temperature probe are in place.    FINDINGS:  Multifocal cavitary lesions in bilateral lung fields with small bilateral pleural effusion. Cardiac size is within normal limit without pulmonary edema. No pneumothorax.      Impression    Interval placement of the right IJ central venous catheter, tip projecting over right atrium. No pneumothorax.   XR AP MOBILE CHEST     Status: None    Narrative    Analeah Kirtz  Female, 35 years old.    XR AP MOBILE CHEST performed on 02/06/2021 5:56 AM.    REASON FOR EXAM:  ETT, endocarditis    TECHNIQUE: 1 views/1 images submitted for interpretation.    COMPARISON:  Same date at 12:38 AM      Impression    There are stable support devices with esophageal temperature probe tip at the level of the gastroesophageal junction. Slight retraction is recommended. Mixed interstitial and airspace opacities and cavitary opacities are redemonstrated. No visible pneumothorax.   MRI BRAIN WO CONTRAST     Status: None    Narrative    Ambreen Eaker  Female, 35 years old.    MRI BRAIN WO CONTRAST performed on 02/06/2021 11:29 PM.    REASON FOR EXAM:  Encephalopathy    TECHNIQUE: Sagittal T1, axial FLAIR, coronal FLAIR axial TSE T2, axial diffusion, axial T1, axial GRE, axial T2,  coronal T2, and coronal 3-D RAGE.    COMPARISON: CT facial bone from 02/05/2021. CT brain from 02/05/2021.    FINDINGS:  The diffusion sequences are negative for an acute infarct. There is no abnormal brain tissue staining by blood breakdown products on blood sensitive sequences. No extra-axial fluid collection identified. There is no midline shift, herniation or hydrocephalus.    There is no masslike brain lesion. Although, evaluation is limited due to lack of IV contrast.    Major proximal intracranial flow voids are preserved. Bilateral mastoid effusions.        Impression    1.No acute intracranial abnormality.  2.Bilateral mastoid effusions.   XR AP MOBILE CHEST     Status: None    Narrative    Shalayah Malson  Female, 35 years old.    XR AP MOBILE CHEST performed on 02/07/2021 5:16 AM.    REASON FOR EXAM:  Respiratory Failure    TECHNIQUE: 1 views/1 images submitted for interpretation.    COMPARISON:  February 06, 2021    FINDINGS:  Esophageal temperature probe is noted projecting over the lower cervical spine. Other lines and support devices are unchanged in position. Minor airspace opacities are again noted, unchanged. No lobar consolidation, pleural effusions or pneumothorax. Cardiac size  and central pulmonary vasculature within normal limits. Visualized osseous structures are unremarkable.      Impression    Minor air space opacities unchanged in position. No lobar consolidation or pleural effusions.     Impression:  Catera Hankins is a 35 y.o., White female with a past medical history of IVDU, frequent kidney stones with concern for recurrent UTIs vs pyelo, and hep C (unsure of viral load).  Patient was hospitalized at [facility] 8/18 after being found down with suspected overdose.  She required intubation due to bradypnea and lethargy as well as question for aspiration pneumonia.  Blood cultures have been positive for MRSA from 08/18 through 8/21 (this isolate was susceptible to vancomycin at outside  facility). Additionally, a respiratory culture was positive for MRSA.  Patient had TTE at outside facility that suggested tricuspid regurgitation and large tricuspid vegetation.  Patient had severe anuric AKI and has required HD. Patient transferred to [facility] for cardiac surgery evaluation.    Upon admission to [facility] 8/22, patient is intubated on minimal vent settings.  She has recently been on HD due to severe anuric AKI.  She has been receiving cefepime and vancomycin, which has been deescalated to vancomycin monotherapy due to suspicion that this is all monomicrobial process with MRSA.  Repeat TTE 8/22 shows 1.4 x 0.8 cm tricuspid valve vegetation with mild tricuspid valve regurgitation.  Additionally, lung imaging suggests multiple lung nodularities and possible cavitation, which is suggestive of bilateral septic pulmonary emboli.  Blood cultures 8/22 growing MRSA.  BAL 8/22 with greater than 15,000 MRSA.  Blood cultures 8/24 already positive with what will likely be MRSA.  Fortunately, MIC of vanc from blood cultures is 1.     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  Please repeat blood cultures Q 48 hours until negative.  Patient may need line holiday.  Agree with continuing vancomycin monotherapy pharmacy to dose protocol.  Please keep in mind patient's HD requirements when dosing.      Please continue to follow CBC with differential, BUN, creatinine, hepatic function panel, and CRP while the patient remains on antimicrobial therapy.    Thank you for this consultation.  We will continue to follow.  Please call or page the Infectious Diseases Service with any questions regarding this patient.    Reatha Harps, MD  02/08/2021, 16:58      02/08/2021    I saw and examined the patient.  I reviewed the fellow's note.  I agree with the findings and plan of care as  documented in the fellow's note.  Any exceptions/additions are edited/noted.     On the day of the encounter, a total of 15 minutes was spent in direct/indirect care of this patient including evaluation, review and interpretation of laboratory and microbiology studies, review of radiology studies and the medical record, order entry and coordination of care, counseling and educating the patient/family/caregiver, and documentation.    Marlene Lard, M.D.  Assistant Professor  Section of Infectious Diseases  [employer]  02/08/2021, 17:07

## 2021-02-09 ENCOUNTER — Encounter (HOSPITAL_COMMUNITY): Payer: Self-pay

## 2021-02-09 ENCOUNTER — Inpatient Hospital Stay (HOSPITAL_COMMUNITY): Payer: Medicaid Other

## 2021-02-09 ENCOUNTER — Encounter (HOSPITAL_COMMUNITY)
Admission: AD | Disposition: A | Discharge status: Home / Self Care | Payer: Self-pay | Source: Other Acute Inpatient Hospital | Attending: Student in an Organized Health Care Education/Training Program

## 2021-02-09 ENCOUNTER — Inpatient Hospital Stay (HOSPITAL_COMMUNITY): Payer: Medicaid Other | Admitting: Student in an Organized Health Care Education/Training Program

## 2021-02-09 DIAGNOSIS — N19 Unspecified kidney failure: Secondary | ICD-10-CM

## 2021-02-09 DIAGNOSIS — Z4682 Encounter for fitting and adjustment of non-vascular catheter: Secondary | ICD-10-CM

## 2021-02-09 DIAGNOSIS — K029 Dental caries, unspecified: Secondary | ICD-10-CM

## 2021-02-09 DIAGNOSIS — Z452 Encounter for adjustment and management of vascular access device: Secondary | ICD-10-CM

## 2021-02-09 DIAGNOSIS — R918 Other nonspecific abnormal finding of lung field: Secondary | ICD-10-CM

## 2021-02-09 DIAGNOSIS — T6591XA Toxic effect of unspecified substance, accidental (unintentional), initial encounter: Secondary | ICD-10-CM

## 2021-02-09 LAB — CBC WITH DIFF
HCT: 25.2 % — ABNORMAL LOW (ref 34.8–46.0)
HGB: 8.2 g/dL — ABNORMAL LOW (ref 11.5–16.0)
MCH: 24.6 pg — ABNORMAL LOW (ref 26.0–32.0)
MCHC: 32.5 g/dL (ref 31.0–35.5)
MCV: 75.7 fL — ABNORMAL LOW (ref 78.0–100.0)
MPV: 10.2 fL (ref 8.7–12.5)
PLATELETS: 167 10*3/uL (ref 150–400)
RBC: 3.33 10*6/uL — ABNORMAL LOW (ref 3.85–5.22)
RDW-CV: 21.8 % — ABNORMAL HIGH (ref 11.5–15.5)
WBC: 24.8 10*3/uL — ABNORMAL HIGH (ref 3.7–11.0)

## 2021-02-09 LAB — PHOSPHORUS: PHOSPHORUS: 1.8 mg/dL — ABNORMAL LOW (ref 2.4–4.7)

## 2021-02-09 LAB — BASIC METABOLIC PANEL
ANION GAP: 9 mmol/L (ref 4–13)
BUN/CREA RATIO: 12 (ref 6–22)
BUN: 24 mg/dL (ref 8–25)
CALCIUM: 7.6 mg/dL — ABNORMAL LOW (ref 8.5–10.0)
CHLORIDE: 101 mmol/L (ref 96–111)
CO2 TOTAL: 24 mmol/L (ref 22–30)
CREATININE: 1.93 mg/dL — ABNORMAL HIGH (ref 0.60–1.05)
ESTIMATED GFR: 34 mL/min/BSA — ABNORMAL LOW (ref >=60)
GLUCOSE: 88 mg/dL (ref 65–125)
POTASSIUM: 4.9 mmol/L (ref 3.5–5.1)
SODIUM: 134 mmol/L — ABNORMAL LOW (ref 136–145)

## 2021-02-09 LAB — MANUAL DIFF AND MORPHOLOGY-SYSMEX
BASOPHIL #: <0.1 10*3/uL (ref <=0.20)
BASOPHIL %: 0 %
EOSINOPHIL #: 0.25 10*3/uL (ref <=0.50)
EOSINOPHIL %: 1 %
LYMPHOCYTE #: <0.1 10*3/uL — ABNORMAL LOW (ref 1.00–4.80)
LYMPHOCYTE %: 0 %
MONOCYTE #: 0.74 10*3/uL (ref 0.20–1.10)
MONOCYTE %: 3 %
NEUTROPHIL #: 24.06 10*3/uL — ABNORMAL HIGH (ref 1.50–7.70)
NEUTROPHIL %: 97 %
RBC MORPHOLOGY: NORMAL

## 2021-02-09 LAB — ALBUMIN: ALBUMIN: 1.4 g/dL — ABNORMAL LOW (ref 3.5–5.0)

## 2021-02-09 LAB — POC BLOOD GLUCOSE (RESULTS)
GLUCOSE, POC: 76 mg/dl (ref 70–105)
GLUCOSE, POC: 86 mg/dl (ref 70–105)
GLUCOSE, POC: 88 mg/dl (ref 70–105)
GLUCOSE, POC: 88 mg/dl (ref 70–105)
GLUCOSE, POC: 90 mg/dl (ref 70–105)
GLUCOSE, POC: 99 mg/dl (ref 70–105)
GLUCOSE, POC: 99 mg/dl (ref 70–105)

## 2021-02-09 LAB — TRIGLYCERIDE: TRIGLYCERIDES: 264 mg/dL — ABNORMAL HIGH (ref <150)

## 2021-02-09 LAB — LAVENDER TOP TUBE

## 2021-02-09 LAB — MAGNESIUM: MAGNESIUM: 2.4 mg/dL (ref 1.8–2.6)

## 2021-02-09 LAB — GOLD TOP TUBE

## 2021-02-09 LAB — VANCOMYCIN, TROUGH: VANCOMYCIN TROUGH: 16.9 ug/mL (ref 10.0–20.0)

## 2021-02-09 SURGERY — EXTRACTION TEETH MULTIPLE
Anesthesia: General | Site: Mouth | Wound class: Clean Contaminated Wounds-The respiratory, GI, Genital, or urinary

## 2021-02-09 MED ORDER — FENTANYL (PF) 50 MCG/ML INJECTION SOLUTION
INTRAMUSCULAR | Status: AC
Start: 2021-02-09 — End: 2021-02-09
  Filled 2021-02-09: qty 2

## 2021-02-09 MED ORDER — HYDROMORPHONE 1 MG/ML INJECTION WRAPPER
0.2000 mg | INJECTION | INTRAMUSCULAR | Status: DC | PRN
Start: 2021-02-09 — End: 2021-02-12
  Administered 2021-02-10 – 2021-02-11 (×5): 0.2 mg via INTRAVENOUS
  Filled 2021-02-09 (×5): qty 1

## 2021-02-09 MED ORDER — ROCURONIUM 10 MG/ML INTRAVENOUS SOLUTION
Freq: Once | INTRAVENOUS | Status: DC | PRN
Start: 2021-02-09 — End: 2021-02-09
  Administered 2021-02-09 (×2): 50 mg via INTRAVENOUS

## 2021-02-09 MED ORDER — SUGAMMADEX 100 MG/ML INTRAVENOUS SOLUTION
Freq: Once | INTRAVENOUS | Status: DC | PRN
Start: 2021-02-09 — End: 2021-02-09
  Administered 2021-02-09: 200 mg via INTRAVENOUS

## 2021-02-09 MED ORDER — SODIUM CHLORIDE 3 % FOR NEBULIZATION
3.0000 mL | INHALATION_SOLUTION | Freq: Three times a day (TID) | RESPIRATORY_TRACT | Status: DC
Start: 2021-02-09 — End: 2021-02-11
  Administered 2021-02-09: 3 mL via RESPIRATORY_TRACT
  Administered 2021-02-10 – 2021-02-11 (×5): 0 mL via RESPIRATORY_TRACT
  Filled 2021-02-09 (×2): qty 4

## 2021-02-09 MED ORDER — LIDOCAINE 1 %-EPINEPHRINE 1:100,000 INJECTION SOLUTION
15.0000 mL | Freq: Once | INTRAMUSCULAR | Status: DC | PRN
Start: 2021-02-09 — End: 2021-02-10
  Administered 2021-02-09: 10 mL via INTRAMUSCULAR

## 2021-02-09 MED ORDER — ELECTROLYTE-A INTRAVENOUS SOLUTION
INTRAVENOUS | Status: DC | PRN
Start: 2021-02-09 — End: 2021-02-09

## 2021-02-09 MED ORDER — BISACODYL 10 MG RECTAL SUPPOSITORY
10.0000 mg | Freq: Every day | RECTAL | Status: DC | PRN
Start: 2021-02-09 — End: 2021-02-13

## 2021-02-09 MED ORDER — HYDROMORPHONE 1 MG/ML INJECTION WRAPPER
INJECTION | INTRAMUSCULAR | Status: AC
Start: 2021-02-09 — End: 2021-02-09
  Filled 2021-02-09: qty 2

## 2021-02-09 MED ORDER — LEVALBUTEROL CONCENTRATE 1.25 MG/0.5 ML SOLUTION FOR NEBULIZATION
1.2500 mg | INHALATION_SOLUTION | Freq: Three times a day (TID) | RESPIRATORY_TRACT | Status: DC
Start: 2021-02-09 — End: 2021-02-11
  Administered 2021-02-09: 1.25 mg via RESPIRATORY_TRACT
  Administered 2021-02-10 – 2021-02-11 (×6): 0 mg via RESPIRATORY_TRACT
  Filled 2021-02-09 (×2): qty 1

## 2021-02-09 MED ORDER — MIDAZOLAM 1 MG/ML INJECTION SOLUTION
INTRAMUSCULAR | Status: AC
Start: 2021-02-09 — End: 2021-02-09
  Filled 2021-02-09: qty 2

## 2021-02-09 MED ORDER — HYDROMORPHONE 1 MG/ML INJECTION WRAPPER
INJECTION | Freq: Once | INTRAMUSCULAR | Status: DC | PRN
Start: 2021-02-09 — End: 2021-02-09
  Administered 2021-02-09: 2 mg via INTRAVENOUS

## 2021-02-09 MED ORDER — MIDAZOLAM (PF) 1 MG/ML INJECTION SOLUTION
Freq: Once | INTRAMUSCULAR | Status: DC | PRN
Start: 2021-02-09 — End: 2021-02-09
  Administered 2021-02-09: 2 mg via INTRAVENOUS

## 2021-02-09 MED ORDER — PHENYLEPHRINE 50 MG/250 ML (200 MCG/ML) IN 0.9 % SODIUM CHLORIDE IV
INTRAVENOUS | Status: DC | PRN
Start: 2021-02-09 — End: 2021-02-09
  Administered 2021-02-09: .5 ug/kg/min via INTRAVENOUS
  Administered 2021-02-09: 1 ug/kg/min via INTRAVENOUS
  Administered 2021-02-09: 0 ug/kg/min via INTRAVENOUS

## 2021-02-09 MED ORDER — LIDOCAINE (PF) 100 MG/5 ML (2 %) INTRAVENOUS SYRINGE
INJECTION | Freq: Once | INTRAVENOUS | Status: DC | PRN
Start: 2021-02-09 — End: 2021-02-09
  Administered 2021-02-09: 60 mg via INTRAVENOUS

## 2021-02-09 MED ORDER — SODIUM CHLORIDE 0.9 % INTRAVENOUS SOLUTION
0.2000 ug/kg/h | INTRAVENOUS | Status: AC
Start: 2021-02-09 — End: 2021-02-10
  Administered 2021-02-09: 0.6 ug/kg/h via INTRAVENOUS
  Administered 2021-02-09: 0.2 ug/kg/h via INTRAVENOUS
  Administered 2021-02-09: 0.6 ug/kg/h via INTRAVENOUS
  Administered 2021-02-09: 0.4 ug/kg/h via INTRAVENOUS
  Administered 2021-02-10: 0 ug/kg/h via INTRAVENOUS
  Administered 2021-02-10: 0.8 ug/kg/h via INTRAVENOUS
  Administered 2021-02-10: 0.4 ug/kg/h via INTRAVENOUS
  Administered 2021-02-10: 0.2 ug/kg/h via INTRAVENOUS
  Filled 2021-02-09 (×2): qty 4

## 2021-02-09 MED ORDER — BUPIVACAINE-EPINEPHRINE (PF) 0.25 %-1:200,000 INJECTION SOLUTION
30.0000 mL | Freq: Once | INTRAMUSCULAR | Status: DC | PRN
Start: 2021-02-09 — End: 2021-02-10

## 2021-02-09 MED ORDER — PROPOFOL 10 MG/ML IV BOLUS
INJECTION | Freq: Once | INTRAVENOUS | Status: DC | PRN
Start: 2021-02-09 — End: 2021-02-09
  Administered 2021-02-09 (×2): 50 mg via INTRAVENOUS
  Administered 2021-02-09: 200 mg via INTRAVENOUS

## 2021-02-09 MED ORDER — MAGNESIUM SULFATE 4 GRAM/100 ML (4 %) IN WATER INTRAVENOUS PIGGYBACK
4.0000 g | INJECTION | Freq: Once | INTRAVENOUS | Status: AC
Start: 2021-02-09 — End: 2021-02-09
  Administered 2021-02-09: 0 g via INTRAVENOUS
  Administered 2021-02-09: 4 g via INTRAVENOUS
  Filled 2021-02-09: qty 100

## 2021-02-09 MED ORDER — CHLORHEXIDINE GLUCONATE 0.12 % MOUTHWASH
15.0000 mL | MOUTHWASH | Freq: Once | Status: AC
Start: 2021-02-09 — End: 2021-02-09
  Administered 2021-02-09: 15 mL via TOPICAL

## 2021-02-09 MED ORDER — ONDANSETRON HCL (PF) 4 MG/2 ML INJECTION SOLUTION
Freq: Once | INTRAMUSCULAR | Status: DC | PRN
Start: 2021-02-09 — End: 2021-02-09
  Administered 2021-02-09: 4 mg via INTRAVENOUS

## 2021-02-09 MED ORDER — PHENYLEPHRINE 1 MG/10 ML (100 MCG/ML) IN 0.9 % SOD.CHLORIDE IV SYRINGE
INJECTION | Freq: Once | INTRAVENOUS | Status: DC | PRN
Start: 2021-02-09 — End: 2021-02-09
  Administered 2021-02-09: 150 ug via INTRAVENOUS
  Administered 2021-02-09: 400 ug via INTRAVENOUS
  Administered 2021-02-09: 300 ug via INTRAVENOUS
  Administered 2021-02-09: 400 ug via INTRAVENOUS
  Administered 2021-02-09 (×2): 300 ug via INTRAVENOUS
  Administered 2021-02-09: 500 ug via INTRAVENOUS
  Administered 2021-02-09: 150 ug via INTRAVENOUS

## 2021-02-09 MED ORDER — ONDANSETRON HCL (PF) 4 MG/2 ML INJECTION SOLUTION
4.0000 mg | Freq: Three times a day (TID) | INTRAMUSCULAR | Status: DC | PRN
Start: 2021-02-09 — End: 2021-02-13

## 2021-02-09 MED ORDER — DEXAMETHASONE SODIUM PHOSPHATE 4 MG/ML INJECTION SOLUTION
Freq: Once | INTRAMUSCULAR | Status: DC | PRN
Start: 2021-02-09 — End: 2021-02-09
  Administered 2021-02-09 (×2): 4 mg via INTRAVENOUS

## 2021-02-09 MED ORDER — FENTANYL (PF) 50 MCG/ML INJECTION SOLUTION
Freq: Once | INTRAMUSCULAR | Status: DC | PRN
Start: 2021-02-09 — End: 2021-02-09
  Administered 2021-02-09: 100 ug via INTRAVENOUS

## 2021-02-09 SURGICAL SUPPLY — 30 items
BLANKET MISTRAL-AIR ADULT LWR BODY 55.9X40.2IN FRC AIR HI VOL BLWR INTUITIVE CONTROL PNL LRG LED (MED SURG SUPPLIES) ×1 IMPLANT
BURR SURG 2.1MM CARBIDE XCUT FSSR 6 FLTD STRL (SURGICAL CUTTING SUPPLIES) IMPLANT
BURR SURG 2.1MM CARBIDE XCUT F_SSR 6 FLTD STRL (CUTTING ELEMENTS)
BURR SURG 44.5MM 1.6MM 702 1 CARBIDE STRL LF  DISP (SURGICAL CUTTING SUPPLIES) ×1 IMPLANT
BURR SURG 44.5MM 1.6MM 702 1 C_ARBIDE STRL LF DISP (CUTTING ELEMENTS) ×1
BURR SURG 44.5MM 1.8MM 6 1 CARBIDE HP STRL DISP (SURGICAL CUTTING SUPPLIES) ×1 IMPLANT
BURR SURG 44.5MM 1.8MM 6 1 CAR_BIDE HP STRL DISP (CUTTING ELEMENTS) ×1
BURR SURG 44.5MM 2.1MM CARBIDE 1703 1 HEAD SHNK STRL DISP (SURGICAL CUTTING SUPPLIES) ×1 IMPLANT
BURR SURG 44.5MM 2.1MM CARBIDE_1703 1 HEAD SHNK STRL DISP (CUTTING ELEMENTS) ×1
CATH IV 18GA 1.16IN SHIELD NTCH NEEDLE PSHBTN DEHP-FR BD VLN INST ATGRD PERI STD STRL LF  DISP GRN (IV TUBING & ACCESSORIES) IMPLANT
CONV USE 163322 - SYRINGE 20ML LF  STRL LL MED (MED SURG SUPPLIES) ×1 IMPLANT
CONV USE 23866 - NEEDLE HYPO 27GA 1.5IN STD MONOJECT SS POLYPROP REG BVL LL HUB UL SHRP ANTICORE YW STRL LF  DISP (MED SURG SUPPLIES) ×2 IMPLANT
CONV USE ITEM 337890 - PACK SURG BSIN 2 STRL LF  DISP (CUSTOM TRAYS & PACK) ×1 IMPLANT
CONV USE ITEM 91465 - SUTURE CHR 3-0 PS2 MTPS 27IN BRN MONOF ABS (SUTURE/WOUND CLOSURE) ×1 IMPLANT
COVER WAND RFD STRL 50EA/CS_01-0020 (DRAPE/PACKS/SHEETS/OR TOWEL) ×1
COVER WND RF DETECT STRL CLR EQP (DRAPE/PACKS/SHEETS/OR TOWEL) ×1 IMPLANT
DISCONTINUED USE 338665 - PACK SURG ENT STRL DISP LTX (CUSTOM TRAYS & PACK) ×1 IMPLANT
GARMENT COMPRESS MED CALF CENTAURA NYL VASOGRAD LTWT BRTHBL SEQ FIL BLU 18- IN (MED SURG SUPPLIES) ×1 IMPLANT
GARMENT COMPRESS MED CALF CENT_AURA NYL VASOGRAD LTWT BRTHBL (MED SURG SUPPLIES) ×1
GUARD BUR BIENAIR NOSE CONE STRL LF  DISP (SURGICAL CUTTING SUPPLIES) ×1 IMPLANT
HANDPC DENTAL IRRG LINE 2.3MR SIL PVC 2.6MM 1.4MM STRL DISP (DENTAL PRODUCTS) ×1 IMPLANT
NEEDLE 3CM COLORADO MICRO STR HEAT RST TIP UL SHRP DSCT TUNG NPTN E-SEP SMOKE EVAC PNCL LF  DISP (SURGICAL CUTTING SUPPLIES) IMPLANT
NEEDLE 3CM COLORADO MICRO STR_HEAT RST TIP UL SHRP DSCT TUNG (CUTTING ELEMENTS)
PACK BASIN DBL CUSTOM (CUSTOM TRAYS & PACK) ×1
PACK SURG ENT STRL DISP LTX (CUSTOM TRAYS & PACK) ×1
PACK SURG TBG STRL DISP 30IN SIL LF (MED SURG SUPPLIES) ×1 IMPLANT
SYRINGE 20ML LF STRL LL MED (MED SURG SUPPLIES) ×1
SYRINGE LL 10ML LF  STRL CONTROL CONCEN TIP PRGN FREE DEHP-FR MED DISP (MED SURG SUPPLIES) ×2 IMPLANT
TUBING SILICONE 30IN (MED SURG SUPPLIES) ×1
XCONV USE ITEM 161881 - TOOTHBRUSH ADULT EXTRA SOFT_MDS136000 144/BX (DENTAL PRODUCTS) ×1 IMPLANT

## 2021-02-09 NOTE — Brief Op Note (Signed)
[facility]                                              BRIEF OPERATIVE NOTE    Patient Name: Kricket, Len Number: [id number]  Date of Service: 02/09/2021   Date of Birth: [birth year]    All elements must be documented.    Pre-Operative Diagnosis:Dental caries   Post-Operative Diagnosis:same  Procedure(s)/Description:Extraction of remaining teeth (teeth #21,22,23,24,25,26,27,28,29)  Findings: none     Attending Surgeon: Dr. Donnel Saxon  Assistant(s): Erasmo Downer, DDS    Anesthesia Type: General  Estimated Blood Loss:  Minimal  Blood Given: none  Fluids Given: See anesthesia record  Complications (unintended/unexpected/iatrogenic/accidental/inadvertent events):  none  Characteristic Event (routinely expected or inherent to the difficulty/nature of the procedure): none  Did the use of current and/or prior Anticoagulants impact the outcome of the case? no  Wound Class: Clean Contaminated Wounds -Respiratory, GI, Genital, or Urinary    Tubes: None  Drains: None  Specimens/ Cultures: none  Implants: none           Disposition: ICU - extubated and stable.  Condition: stable    Erasmo Downer, DDS

## 2021-02-09 NOTE — OR Surgeon (Signed)
[facility]                                                     OPERATIVE NOTE    Patient Name: [name] [name] Number: [id number]  Date of Service: 02/09/2021  Date of Birth: [birth year]    All elements must be documented.    Pre-Operative Diagnosis: Dental Caries  Post-Operative Diagnosis: Same  Operation performed:  Extraction of remaining teeth (teeth #21,22,23,24,25,26,27,28,29)    Attending Surgeon: Dr. Donnel Saxon  Assistant(s): Erasmo Downer, DDS    Anesthesia Type: General endotracheal anesthesia    ESTIMATED BLOOD LOSS: minimal    COMPLICATIONS:  None.    INDICATIONS FOR PROCEDURE:  with grossly non restorable teeth requiring extraction     OPERATIVE FINDINGS: Generalized carious and non restorable teeth     DESCRIPTION OF PROCEDURE:  Patient was met in the preop area and informed consent was reviewed.    The patient was brought to the operating room and placed in the supine position. Time-out completed. Patient was intubated with oro-endotracheal tube, secured with tape. Eyes protected with clear tape, following induction of general anesthesia intraoral prepped with 0.12% chlorohexidine and toothbrush.   The oropharynx was suctioned freed of all secretions and debris. Patient draped in a sterile fashion. A throat pack was placed.     10cc 1% lidocaine w/1:100:000 epinehprine used via infiltrations    Attention was paid to the mandible. By use of a number 15 blade, sulcular incision around teeth #21,22,23,24,25,26,27,28,29 with distal release bilaterally along external oblique ridge, Full thickness mucoperiosteal flap reflected. Teeth #21,22,23,24,25,26,27,28,29  luxated until sufficient mobility achieved and extracted using forceps. All root tips accounted for. Bony edges removed with rongeur and rasp. Surgafoam placed into sockets and tissues reapproximated with 3.0 chromic gut suture in running interlocking fashion in mandibular right and left quadrants.     Sites noted to be  hemostatic. Copious irrigation with normal saline and suctioned. Throat pack removed with simultaneous suctioning with Yankauer. Orogastric tube inserted and suctioning of gastric contents.     Patient tolerated the procedure well, was extubated in the operating room and was brought to the recovery room in stable condition. The attending Dr. Donnel Saxon was present for the entirety of the procedure      Erasmo Downer, DDS  02/09/2021, 12:05

## 2021-02-09 NOTE — Anesthesia Postprocedure Evaluation (Signed)
Anesthesia Post Op Evaluation    Patient: [name] [name]  Procedure(s):  EXTRACTION TEETH MULTIPLE    Last Vitals:Temperature: (!) 38.3 C (100.9 F) (02/09/21 1454)  Heart Rate: (!) 119 (02/09/21 1454)  BP (Non-Invasive): 107/65 (02/09/21 1454)  Respiratory Rate: (!) 28 (02/09/21 1454)  SpO2: 100 % (02/09/21 [id number])    No complications documented.    Patient is sufficiently recovered from the effects of anesthesia to participate in the evaluation and has returned to their pre-procedure level.  Patient location during evaluation: ICU       Patient participation: complete - patient cannot participate  Level of consciousness: sleepy but conscious    Pain management: adequate  Airway patency: patent    Anesthetic complications: no  Cardiovascular status: acceptable  Respiratory status: acceptable  Hydration status: acceptable  Patient post-procedure temperature: Pt Normothermic   PONV Status: Absent

## 2021-02-09 NOTE — Consults (Signed)
INFECTIOUS DISEASE FOLLOW UP CONSULTATION    Patient Name: [name] [name] Number: [id number]  Date of Service: 02/09/2021  Date of Birth: 1986-05-21    Hospital Day:  LOS: 4 days     Reason for Consultation:  MRSA native tricuspid valve endocarditis    Subjective:  Ms. Penland self-extubated yesterday.  She is currently stable in the CVICU.  She is going to the OR today for dental extractions and will be going to the OR next week for heart valve surgery.  This morning, she reports pain in her thoracic and lumbosacral spine.  She denies any fevers but reports feeling cold.      Objective:  Physical Exam:  Current Vitals: BP 128/88   Pulse (!) 126   Temp 37.1 C (98.7 F)   Resp (!) 38   Ht 1.651 m (_0 )   Wt 71.4 kg (157 lb 6.5 oz)   SpO2 96%   BMI 26.19 kg/m       Vitals in last 24 hours: Temp  Avg: 37 C (98.6 F)  Min: 36.4 C (97.5 F)  Max: 37.8 C (100 F)  MAP (Non-Invasive)  Avg: 100.2 mmHG  Min: 73 mmHG  Max: 192 mmHG  Pulse  Avg: 126.6  Min: 121  Max: 139  Resp  Avg: 28.8  Min: 14  Max: 43  SpO2  Avg: 95.4 %  Min: 79 %  Max: 100 %  General: Patient is acutely ill-appearing, nontoxic, resting in a hospital bed in the CVICU.   Eyes: There is no conjunctival injection or scleral icterus.  There are no subconjunctival hemorrhages.  ENT:  She has poor dentition with multiple missing teeth and caries.  Neck:  A Trialysis catheter is in place in the right IJ.  Lungs:  There are crackles throughout all lung fields.  Cardiovascular: Heart is tachycardic with no murmur.  Abdomen: Soft, non-tender, nondistended with normal bowel sounds.    Musculoskeletal:  There is significant pain with palpation of the thoracic and lumbosacral spine.  There is no pain with palpation of the cervical spine.  Skin: She has purpuric lesions on her low back/ buttocks.  She also has purplish purpuric lesions on her hands.  Neurologic: Patient is alert and oriented.  She is able to lift both legs off the bed.       Inpatient  Medications:  acetaminophen (TYLENOL) tablet, 650 mg, Oral, Q4H PRN  B complex-vitamin C-folic acid (NEPHRONEX) oral liquid, 5 mL, Oral, Daily  bisacodyl (DULCOLAX) rectal suppository, 10 mg, Rectal, Daily PRN  budesonide (PULMICORT RESPULES) 0.5 mg/2 mL nebulizer suspension, 1 mg, Nebulization, 2x/day  docusate sodium (COLACE) capsule, 100 mg, Oral, 2x/day  heparin 5,000 unit/mL injection, 5,000 Units, Subcutaneous, Q8HRS  HYDROmorphone (DILAUDID) 1 mg/mL injection, 0.3 mg, Intravenous, Q3H PRN  levalbuterol (XOPENEX) 0.63 mg/ 3 mL nebulizer solution, 0.63 mg, Nebulization, 3x/day  lidocaine 20 mg/mL (2 %) injection ---Cabinet Override, , ,   magnesium sulfate 4 G in SW 100 mL premix IVPB, 4 g, Intravenous, Once  miconazole nitrate (SECURA) 2% topical cream, , Apply Topically, 2x/day  NS flush syringe, 2-6 mL, Intracatheter, Q8HRS   And  NS flush syringe, 2-6 mL, Intracatheter, Q1 MIN PRN  NS flush syringe, 10-40 mL, Intravenous, Q8HRS  NS flush syringe, 20-30 mL, Intravenous, Q1 MIN PRN  ondansetron (ZOFRAN) 2 mg/mL injection, 4 mg, Intravenous, Q8H PRN  polyethylene glycol (MIRALAX) oral packet, 17 g, Oral, Daily  prismaSATE (bicarbonate-based) 4K-2.5Ca dialysate premix, , Hemodialysis,  Dialysis Cont.  QUEtiapine (SEROQUEL) tablet, 50 mg, Oral, 2x/day  sodium chloride 3% nebulizer solution, 3 mL, Nebulization, Q12H  sodium citrate 4% (3 mL) injection syringe, 2 Syringe, Intracatheter, Q1H PRN  SSIP insulin lispro 100 units/mL injection, 0-12 Units, Subcutaneous, Q4H PRN  vancomycin (VANCOCIN) 1 g in D5W 200 mL premix IVPB, 15 mg/kg (Adjusted), Intravenous, Q24H  Vancomycin IV - Pharmacist to Dose per Protocol, , Does not apply, Daily PRN        Current Antimicrobials:  Antibiotics (From admission, onward)    Start     Stop Route Frequency    02/06/21 2000  vancomycin (VANCOCIN) 1 g in D5W 200 mL premix IVPB         -- IV EVERY 24 HOURS    02/05/21 0226  Vancomycin IV Intermittent Dosing  Status:  Discontinued          08/23 1608 N/A DAILY PRN           Lines:  Patient Lines/Drains/Airways Status     Active Line / Dialysis Catheter / Dialysis Graft / Drain / Airway / Wound     Name Placement date Placement time Site Days    Peripheral IV Right Basilic  (medial side of arm) 02/04/21  2000  -- 4    Dialysis Catheter Trialysis Catheter 02/06/21  0000  -- 3    Wound (Non-Surgical) Anterior;Right Elbow 02/05/21  0045  -- 4                 Laboratory Studies:  CBC Differential   Recent Labs     02/07/21  1036 02/08/21  0100 02/08/21  0706 02/08/21  1119 02/08/21  1530 02/08/21  2032 02/09/21  0038   WBC 17.4* 21.1* 20.1*  --   --   --  24.8*   HGB 8.4* 8.0* 8.2*   < > 8.1* 8.3* 8.2*   HCT 24.7* 23.7* 24.9*   < > 25.0* 25.1* 25.2*   PLTCNT 130* 137* 148*  --   --   --  167   BANDS 2  --  2  --   --   --   --     < > = values in this interval not displayed.    Recent Labs     02/07/21  1036 02/08/21  0706 02/09/21  0038   PMNS 92 91 97   LYMPHOCYTES 3 3 0   MONOCYTES _0 EOSINOPHIL 0 0 1   BASOPHILS 0  <0.10 2  0.40* 0  <0.10   PMNABS 16.36* 18.69* 24.06*   MONOSABS 0.52 0.20 0.74   EOSABS <0.10 <0.10 0.25      BMP LFTs   Recent Labs     02/08/21  1531 02/09/21  0038   SODIUM 132* 134*   POTASSIUM  --  4.9   CHLORIDE 101 101   CO2  --  24   BUN  --  24   CREATININE  --  1.93*   GLUCOSENF  --  88   ANIONGAP  --  9   BUNCRRATIO  --  12   GFR  --  34*   CALCIUM  --  7.6*   MAGNESIUM  --  2.4   PHOSPHORUS  --  1.8*   ALBUMIN  --  1.4*    Recent Labs     02/09/21  0038   ALBUMIN 1.4*      CoAgs Blood Gas:   No  results found for this encounter Recent Labs     02/08/21  1531   FI02 21.0   PH 7.46*   PCO2 39*   PO2 33*   BICARBONATE 27.0*   BASEEXCESS 3.6*       Cardiac Markers Lipid Panel   No results for input(s): TROPONINI, CKMB, MBINDEX, BNP in the last 72 hours. Recent Labs     02/09/21  0038   TRIG 264*      Urine Analysis Other Labs   Recent Labs     02/08/21  1531   GLUCOSE 101    No results found for this encounter    Invalid  input(s): PRL     Microbiology:  Hospital Encounter on 02/05/21 (from the past 96 hour(s))   BRONCHOALVEOLAR LAVAGE (BAL) QUANTITATIVE CULTURE/GRAM STAIN    Collection Time: 02/05/21 10:19 AM    Specimen: Bronchoalveolar Lavage; Other   Culture Result Status    BRONCHOALVEOLAR LAVAGE (BAL) QUANTITATIVE CULTURE 15000 Methicillin Resistant Staphylococcus aureus (A) Final    BRONCHOALVEOLAR LAVAGE (BAL) QUANTITATIVE CULTURE 2000 Normal Oral Flora Final    GRAM STAIN 3+ Several PMNs Final    GRAM STAIN No Organisms Seen Final       Susceptibility    Methicillin Resistant Staphylococcus aureus - MIC SUSCEPTIBILITY     Oxacillin >=4 Resistant mcg/mL     Gentamicin* <=0.5 Sensitive mcg/mL      * The role of gentamicin for the treatment of MSSA and MRSA infections is in combination with a cell-wall acting agent in the setting of prosthetic valve endocarditis.     Erythromycin >=8 Resistant mcg/mL     Clindamycin 0.25 Sensitive mcg/mL     Tetracycline <=1 Sensitive mcg/mL     Doxycycline <=0.5 Sensitive mcg/mL     Linezolid 2 Sensitive mcg/mL     Vancomycin 1 Sensitive mcg/mL     Tigecycline <=0.12 Sensitive mcg/mL     Rifampin* <=0.5 Sensitive mcg/mL      * Rifampin should not be used alone for the treatment of Staphylococcus due to the quick emergence of resistance. For use in combination with other active agents for prosthetic material biofilm penetration.      Trimethoprim/Sulfamethoxazole <=10 Sensitive mcg/mL   ADULT ROUTINE BLOOD CULTURE, SET OF 2 ADULT BOTTLES (BACTERIA AND YEAST)    Collection Time: 02/05/21  9:55 PM    Specimen: Blood   Culture Result Status    BLOOD CULTURE, ROUTINE Abnormal Stain (AA) Final    BLOOD CULTURE, ROUTINE Methicillin Resistant Staphylococcus aureus (A) Final    GRAM STAIN Gram Positive Cocci/Clusters (A) Final       Susceptibility    Methicillin Resistant Staphylococcus aureus - KIRBY-BAUER SUSCEPTIBILITY     Ceftaroline  Sensitive     Methicillin Resistant Staphylococcus aureus - MIC  SUSCEPTIBILITY     Oxacillin >=4 Resistant mcg/mL     Gentamicin* <=0.5 Sensitive mcg/mL      * The role of gentamicin for the treatment of MSSA and MRSA infections is in combination with a cell-wall acting agent in the setting of prosthetic valve endocarditis.     Erythromycin >=8 Resistant mcg/mL     Clindamycin 0.25 Sensitive mcg/mL     Daptomycin 0.5 Sensitive mcg/mL     Tetracycline <=1 Sensitive mcg/mL     Doxycycline <=0.5 Sensitive mcg/mL     Linezolid 2 Sensitive mcg/mL     Vancomycin 1 Sensitive mcg/mL     Rifampin* <=0.5 Sensitive mcg/mL      *  Rifampin should not be used alone for the treatment of Staphylococcus due to the quick emergence of resistance. For use in combination with other active agents for prosthetic material biofilm penetration.      Trimethoprim/Sulfamethoxazole <=10 Sensitive mcg/mL   ADULT ROUTINE BLOOD CULTURE, SET OF 2 ADULT BOTTLES (BACTERIA AND YEAST)    Collection Time: 02/05/21  9:55 PM    Specimen: Blood   Culture Result Status    BLOOD CULTURE, ROUTINE Abnormal Stain (AA) Final    BLOOD CULTURE, ROUTINE Methicillin Resistant Staphylococcus aureus (A) Final    GRAM STAIN Gram Positive Cocci/Clusters (A) Final   MRSA/MSSA MOLECULAR SCREEN, BLOOD    Collection Time: 02/05/21  9:55 PM    Specimen: Blood   Culture Result Status    METHICILLIN RESISTANCE Positive (A) Final    STAPHYLOCOCCUS AUREUS Positive (A) Final    Narrative    INTERPRETATION: Methicillin Resistant S. aureus (MRSA)     Preliminary molecular testing has detected genetic markers for Staph aureus (spa) and methicillin resistance (mecA and SCCmec).  Standard isolate identification and antimicrobial susceptibility testing results are forthcoming.     Methicillin resistance/susceptibility in organisms other than Staphylococcus aureus CANNOT be determined by this assay.    Performed by PCR using the MRSA/SA Blood Culture Assay and the GeneXpert system. Test performance on this bottle type verified at [facility] using an FDA  cleared assay. Results are preliminary and should be interpreted within the clinical context.   ADULT ROUTINE BLOOD CULTURE, SET OF 2 ADULT BOTTLES (BACTERIA AND YEAST)    Collection Time: 02/07/21  5:15 AM    Specimen: Blood   Culture Result Status    BLOOD CULTURE, ROUTINE Abnormal Stain (AA) Final    BLOOD CULTURE, ROUTINE Methicillin Resistant Staphylococcus aureus (A) Final    GRAM STAIN Gram Positive Cocci/Clusters (A) Final    GRAM STAIN Gram Positive Cocci/Clusters (A) Final    Narrative    Initiate Contact Isolation    For susceptibility, see previous report.     ADULT ROUTINE BLOOD CULTURE, SET OF 2 ADULT BOTTLES (BACTERIA AND YEAST)    Collection Time: 02/07/21  5:15 AM    Specimen: Blood   Culture Result Status    BLOOD CULTURE, ROUTINE Abnormal Stain (AA) Final    BLOOD CULTURE, ROUTINE Methicillin Resistant Staphylococcus aureus (A) Final    GRAM STAIN Gram Positive Cocci/Clusters (A) Final    GRAM STAIN Gram Positive Cocci/Clusters (A) Final    Narrative    Initiate Contact Isolation    For susceptibility, see previous report.         No results found for any visits on 02/05/21 (from the past 24 hour(s)).    Imaging Studies:  Results for orders placed or performed during the hospital encounter of 02/05/21 (from the past 72 hour(s))   MRI BRAIN WO CONTRAST     Status: None    Narrative    Schneider  Female, 35 years old.    MRI BRAIN WO CONTRAST performed on 02/06/2021 11:29 PM.    REASON FOR EXAM:  Encephalopathy    TECHNIQUE: Sagittal T1, axial FLAIR, coronal FLAIR axial TSE T2, axial diffusion, axial T1, axial GRE, axial T2, coronal T2, and coronal 3-D RAGE.    COMPARISON: CT facial bone from 02/05/2021. CT brain from 02/05/2021.    FINDINGS:  The diffusion sequences are negative for an acute infarct. There is no abnormal brain tissue staining by blood breakdown products on blood sensitive sequences. No extra-axial  fluid collection identified. There is no midline shift, herniation or  hydrocephalus.    There is no masslike brain lesion. Although, evaluation is limited due to lack of IV contrast.    Major proximal intracranial flow voids are preserved. Bilateral mastoid effusions.        Impression    1.No acute intracranial abnormality.  2.Bilateral mastoid effusions.   XR AP MOBILE CHEST     Status: None    Narrative    Jannatul Gunning  Female, 35 years old.    XR AP MOBILE CHEST performed on 02/07/2021 5:16 AM.    REASON FOR EXAM:  Respiratory Failure    TECHNIQUE: 1 views/1 images submitted for interpretation.    COMPARISON:  February 06, 2021    FINDINGS:  Esophageal temperature probe is noted projecting over the lower cervical spine. Other lines and support devices are unchanged in position. Minor airspace opacities are again noted, unchanged. No lobar consolidation, pleural effusions or pneumothorax. Cardiac size and central pulmonary vasculature within normal limits. Visualized osseous structures are unremarkable.      Impression    Minor air space opacities unchanged in position. No lobar consolidation or pleural effusions.   XR AP MOBILE CHEST     Status: None    Narrative    Jahmiyah Deschene  Female, 35 years old.    XR AP MOBILE CHEST performed on 02/08/2021 5:20 AM.    REASON FOR EXAM:  ETT    TECHNIQUE: 1 views/1 images submitted for interpretation.    COMPARISON:  Chest x-ray from 02/07/2021.    FINDINGS: Subtle patchy bilateral airspace opacities are noted. No lobar   consolidation, pleural effusions or pneumothorax. Cardiac size and central   pulmonary vasculature within normal limits. Visualized osseous structures   appear intact.    Endotracheal tube tip projects approximately 3.3 cm above the carina.  Enteric tube courses towards the left upper quadrant, tip not included.  Esophageal probe tip projects over the cervical esophagus.  Right IJ catheter tip projects over the expected area of the right atrium.        Impression    1.Subtle patchy bilateral airspace opacities are unchanged. No  new   cardiopulmonary process.  2.Esophageal probe tip projects over the cervical esophagus. Repositioning   is recommended. Other support devices as above.            Assessment:  This is a 35 y.o. female with a history of tobacco use, cleared hepatitis-C infection, nephrolithiasis and active injection drug use who was transferred to [facility] from [facility] on February 05, 2021 with MRSA native tricuspid valve endocarditis.  She presented to [facility] on January 31, 2021 with altered mental status, and was found to be in renal failure.  She was intubated and started on hemodialysis.  Blood and sputum cultures that grew MRSA.  On transfer to [facility], a transthoracic echocardiogram confirmed a tricuspid valve vegetation.  CT of the chest shows bilateral airspace opacities some of which are cavitary likely representing septic emboli.  Blood cultures have remained persistently positive for MRSA.  She remains on CRRT.    Recommendations:  1. Continue to follow blood cultures every other day until negative.    2. Patient will likely require a line holiday especially if blood cultures continue to be positive.  3. Plan from Cardiac Surgery is currently pending.    4. Recommend imaging of spine, preferably MRI of the cervical, thoracic, and lumbosacral spines, but if MRI is  not feasible, CT should be performed.  5. Continue vancomycin dosing per pharmacy protocol.   6. Please consult Psychiatry due to history of substance abuse.     Please continue to follow CBC with differential, BUN, creatinine, hepatic function panel, and CRP while the patient remains on antimicrobial therapy.    We will continue following at this time.  Please call or page the Infectious Diseases Service with any questions regarding this patient.    On the day of the encounter, a total of 20 minutes was spent in direct/indirect care of this patient including evaluation, review and interpretation of laboratory and microbiology  studies, review of radiology studies and the medical record, order entry and coordination of care, counseling and educating the patient/family/caregiver, and documentation.    Marlene Lard, M.D.  Assistant Professor  Section of Infectious Diseases  [employer]  02/09/2021, 07:09

## 2021-02-09 NOTE — Care Plan (Signed)
Problem: Adult Inpatient Plan of Care  Goal: Plan of Care Review  Outcome: Ongoing (see interventions/notes)  Flowsheets (Taken 02/08/2021 2000)  Plan of Care Reviewed With: patient  Goal: Patient-Specific Goal (Individualized)  Outcome: Ongoing (see interventions/notes)  Flowsheets (Taken 02/08/2021 2000)  Individualized Care Needs: monitor aspitation risk  Anxieties, Fears or Concerns: eating  Patient-Specific Goals (Include Timeframe): prepare for OR in the AM  Goal: Absence of Hospital-Acquired Illness or Injury  Outcome: Ongoing (see interventions/notes)  Intervention: Identify and Manage Fall Risk  Recent Flowsheet Documentation  Taken 02/09/2021 0700 by Hillery Aldo, RN  Safety Promotion/Fall Prevention: safety round/check completed  Taken 02/09/2021 0600 by Hillery Aldo, RN  Safety Promotion/Fall Prevention: safety round/check completed  Taken 02/09/2021 0500 by Hillery Aldo, RN  Safety Promotion/Fall Prevention: safety round/check completed  Taken 02/09/2021 0400 by Hillery Aldo, RN  Safety Promotion/Fall Prevention: safety round/check completed  Taken 02/09/2021 0300 by Hillery Aldo, RN  Safety Promotion/Fall Prevention: safety round/check completed  Taken 02/09/2021 0200 by Hillery Aldo, RN  Safety Promotion/Fall Prevention: safety round/check completed  Taken 02/09/2021 0100 by Hillery Aldo, RN  Safety Promotion/Fall Prevention: safety round/check completed  Taken 02/09/2021 0000 by Hillery Aldo, RN  Safety Promotion/Fall Prevention: safety round/check completed  Taken 02/08/2021 2300 by Hillery Aldo, RN  Safety Promotion/Fall Prevention: safety round/check completed  Taken 02/08/2021 2200 by Hillery Aldo, RN  Safety Promotion/Fall Prevention: safety round/check completed  Taken 02/08/2021 2100 by Hillery Aldo, RN  Safety Promotion/Fall Prevention: safety round/check completed  Taken 02/08/2021  2000 by Hillery Aldo, RN  Safety Promotion/Fall Prevention:   activity supervised   fall prevention program maintained   muscle strengthening facilitated   motion sensor pad activated   safety round/check completed  Intervention: Prevent Skin Injury  Recent Flowsheet Documentation  Taken 02/09/2021 0700 by Hillery Aldo, RN  Body Position:   positioned/repositioned independently   turned q 2 hours  Taken 02/09/2021 0600 by Hillery Aldo, RN  Body Position:   positioned/repositioned independently   turned q 2 hours  Taken 02/09/2021 0500 by Hillery Aldo, RN  Body Position:   positioned/repositioned independently   turned q 2 hours  Taken 02/09/2021 0300 by Hillery Aldo, RN  Body Position:   positioned/repositioned independently   turned q 2 hours  Taken 02/09/2021 0100 by Hillery Aldo, RN  Body Position:   positioned/repositioned independently   turned q 2 hours  Taken 02/08/2021 2300 by Hillery Aldo, RN  Body Position: fowlers ( 45-60 degrees)  Taken 02/08/2021 2200 by Hillery Aldo, RN  Body Position:   side lying, right   semi-fowlers (less than 30 degrees)   positioned/repositioned independently   turned q 2 hours  Taken 02/08/2021 2100 by Hillery Aldo, RN  Body Position:   side lying, left   semi-fowlers (less than 30 degrees)   positioned/repositioned independently   turned q 2 hours  Taken 02/08/2021 2000 by Hillery Aldo, RN  Body Position:   fowlers ( 45-60 degrees)   positioned/repositioned independently   turned q 2 hours  Intervention: Prevent and Manage VTE (Venous Thromboembolism) Risk  Recent Flowsheet Documentation  Taken 02/08/2021 2000 by Hillery Aldo, RN  VTE Prevention/Management:   anticoagulant therapy maintained   dorsiflexion/plantar flexion performed  Goal: Optimal Comfort and Wellbeing  Outcome: Ongoing (see interventions/notes)  Intervention: Provide Person-Centered Care  Recent  Flowsheet Documentation  Taken 02/08/2021 2000 by Hillery Aldo, RN  Trust Relationship/Rapport:   care explained   choices provided   emotional support provided   reassurance  provided   thoughts/feelings acknowledged  Goal: Rounds/Family Conference  Outcome: Ongoing (see interventions/notes)     Problem: Skin Injury Risk Increased  Goal: Skin Health and Integrity  Outcome: Ongoing (see interventions/notes)  Intervention: Optimize Skin Protection  Recent Flowsheet Documentation  Taken 02/09/2021 0700 by Hillery Aldo, RN  Head of Bed [facility]) Positioning: HOB elevated  Taken 02/09/2021 0600 by Hillery Aldo, RN  Head of Bed [facility]) Positioning: HOB elevated  Taken 02/09/2021 0500 by Hillery Aldo, RN  Head of Bed [facility]) Positioning: HOB elevated  Taken 02/09/2021 0300 by Hillery Aldo, RN  Head of Bed [facility]) Positioning: HOB elevated  Taken 02/09/2021 0100 by Hillery Aldo, RN  Head of Bed [facility]) Positioning: HOB elevated  Taken 02/08/2021 2300 by Hillery Aldo, RN  Head of Bed [facility]) Positioning: HOB elevated  Taken 02/08/2021 2200 by Hillery Aldo, RN  Head of Bed [facility]) Positioning: HOB elevated  Taken 02/08/2021 2100 by Hillery Aldo, RN  Head of Bed [facility]) Positioning: HOB elevated  Taken 02/08/2021 2000 by Hillery Aldo, RN  Pressure Reduction Techniques:   (L,M,H,VH)Frequent Turning   (L,M,H,VH) Turn Schedule - Minimum every 2 hours   (L,M,H,VH) Maximal Remobilization   (L,M,H,VH) Manage Moisture, Nutrition & Shear   (M,H,VH) 30 degree Lateral Positioning   (H,VH) Increase Frequency of Turning   (H,VH) Supplemented with Small Shifts   frequent weight shift encouraged   pressure points protected  Pressure Reduction Devices:   (L.M,H,VH) Heel offloading device utilized   (L.M,H,VH) Pressure redistributing mattress utilized   (L.M,H,VH) Specialty Bed utilized   (M,H,VH) Use Repositioning Devices or Pillows   (L.M,H,VH) Pressure  Redistributing Chair cushion utilized  Skin Protection:   adhesive use limited   electrode sites changed   preventative decubiti skin protection foam dressing applied/intact   transparent dressing maintained   tubing/devices free from skin contact  Head of Bed (HOB) Positioning: HOB elevated     Problem: Infection Progression (Sepsis)  Goal: Absence of Infection Signs and Symptoms  Outcome: Ongoing (see interventions/notes)  Intervention: Promote Stabilization  Recent Flowsheet Documentation  Taken 02/08/2021 2000 by Hillery Aldo, RN  Fever Reduction/Comfort Measures:   lightweight bedding   lightweight clothing  Intervention: Promote Recovery  Recent Flowsheet Documentation  Taken 02/08/2021 2000 by Hillery Aldo, RN  Sleep/Rest Enhancement: awakenings minimized  Activity Management:   activity adjusted per tolerance   bedrest   ROM, active encouraged   dorsiflexion, plantar flexion encouraged     Problem: Fall Injury Risk  Goal: Absence of Fall and Fall-Related Injury  Outcome: Ongoing (see interventions/notes)  Intervention: Identify and Manage Contributors  Recent Flowsheet Documentation  Taken 02/08/2021 2000 by Hillery Aldo, RN  Self-Care Promotion: independence encouraged  Intervention: Promote Injury-Free Environment  Recent Flowsheet Documentation  Taken 02/09/2021 0700 by Hillery Aldo, RN  Safety Promotion/Fall Prevention: safety round/check completed  Taken 02/09/2021 0600 by Hillery Aldo, RN  Safety Promotion/Fall Prevention: safety round/check completed  Taken 02/09/2021 0500 by Hillery Aldo, RN  Safety Promotion/Fall Prevention: safety round/check completed  Taken 02/09/2021 0400 by Hillery Aldo, RN  Safety Promotion/Fall Prevention: safety round/check completed  Taken 02/09/2021 0300 by Hillery Aldo, RN  Safety Promotion/Fall Prevention: safety round/check completed  Taken 02/09/2021 0200 by Hillery Aldo,  RN  Safety Promotion/Fall Prevention: safety round/check completed  Taken 02/09/2021 0100 by Hillery Aldo, RN  Safety Promotion/Fall Prevention: safety round/check completed  Taken 02/09/2021 0000 by Hillery Aldo, RN  Safety Promotion/Fall Prevention: safety round/check completed  Taken 02/08/2021 2300 by Hillery Aldo, RN  Safety Promotion/Fall Prevention: safety round/check completed  Taken 02/08/2021 2200 by Hillery Aldo, RN  Safety Promotion/Fall Prevention: safety round/check completed  Taken 02/08/2021 2100 by Hillery Aldo, RN  Safety Promotion/Fall Prevention: safety round/check completed  Taken 02/08/2021 2000 by Hillery Aldo, RN  Safety Promotion/Fall Prevention:   activity supervised   fall prevention program maintained   muscle strengthening facilitated   motion sensor pad activated   safety round/check completed

## 2021-02-09 NOTE — Progress Notes (Addendum)
[facility]  HVI Critical Care Consult/Progress Note    [name] [name], [name] [name], 35 y.o. female  Date of Birth: 1986/03/22  Medical Record Number:  [id number]   Inpatient Admission Date: 02/05/2021   Hospital Day:  LOS: 0 days     Chief Complaint: Altered Mental Status   History of Present Illness: Per previous providers: "Pt is a 35 yo female with a significant PMH for Smoking and IVDU presented to [name] with AMS on 01/31/21. 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 Urine drug screen + for heroin and methamphetamines.    [place] [facility] [place] [facility] [place] [facility]."   [facility]: [facility]  [facility]: See above.   -Prop for sedation. Agitated; MAE; does not follow. Repeat CT Head/CAP. Hemodynamics stable. Intubated. Repeated blood cultures. Continue Vanc.   8/23: had pan CT 8/22, still not awakening/folowing commands on precedex  But agitated. MRI brain ordered, started on CRRT  8/24: remains on CRRT, MRI Brain no abnormality, following commands on dexmedetomidine, quetiapine, prn dilaudid  8/25 brocnh today for increased ETT secretions,plan for OR early next week, OMFS following for dental root extriction.  Pt self-extubated this afternoon.  Doing well on NC.  8/26: Intermittent delirium. Weak. Continue Seroquel.  Hemodynamics stable. NC. Aggressive pulm toilet. Continue nebs. Speech to see. Dental procedure today. Continues + BC. ID following; MRI CTLS ordered. Continue antibiotics. Surgical plan TBA.    Past Medical History: No past medical history on file.    Past Surgical History: No past surgical history on file.   Social History:  Social History           Socioeconomic  History   . Marital status: Divorced     Spouse name: Not on file   . Number of children: Not on file   . Years of education: Not on file   . Highest education level: Not on file   Occupational History   . Not on file   Tobacco Use   . Smoking status: Not on file   . Smokeless tobacco: Not on file   Substance and Sexual Activity   . Alcohol use: Not on file   . Drug use: Not on file   . Sexual activity: Not on file   Other Topics Concern   . Not on file   Social History Narrative   . Not on file     Social Determinants of Health     Financial Resource Strain: Not on file   Food Insecurity: Not on file   Transportation Needs: Not on file   Physical Activity: Not on file   Stress: Not on file   Intimate Partner Violence: Not on file   Housing Stability: Not on file      Family History: Family Medical History:       None             Allergies: No Known Allergies   ROS: Constitutional: denies fever/chills, appreciates generalized weakness and back pain   Cardiac: denies chest pain, palpatation, syncope  Respiratory: reports mild shortness of breath  GI: denies N/V/D  All other systems reviewed and negative     Problem List:       Patient Active Problem List  Diagnosis   . Endocarditis       Medications:  Scheduled Meds: chlorhexidine gluconate, 15 mL, 2x/day  docusate sodium, 100 mg, 2x/day  famotidine, 10 mg, Daily  NS flush, 2-6 mL, Q8HRS  perflutrn lipid microspheres (DEFINITY) DILUTION injection, 2 mL, Give in Cardiology  propofoL, ,   senna concentrate, 10 mL, 2x/day      IV Infusions: propofoL, Last Rate: 20 mcg/kg/min (02/05/21 0220)      PRN:  NS flush, 2-6 mL, Q1 MIN PRN  insulin lispro, 0-12 Units, Q4H PRN  Pharmacy to Dose Vancomycin, , Daily PRN  Vancomycin IV Intermittent Dosing, , Daily PRN        Last VS:    Heart Rate: (!) 126  Respiratory Rate: (!) 23  BP (Non-Invasive): 130/84    Physical Exam  Vitals reviewed.   Constitutional:       Appearance: She is ill-appearing.       Comments: Critically ill appearing female, no acute distress, becomes restless at times, wants ETT out, following commands   HENT:      Mouth/Throat:      Mouth: Mucous membranes are moist.   Eyes:      General: No scleral icterus.        Right eye: No discharge.         Left eye: No discharge.      Pupils: Pupils are equal, round, and reactive to light.   Cardiovascular:      Rate and Rhythm: Regular rhythm. Tachycardia present.      Pulses: Normal pulses.   Pulmonary:      Effort: Pulmonary effort is normal. No respiratory distress.      Comments: Diminished breath sounds throughout  NC  Abdominal:      General: Abdomen is flat. There is no distension.      Palpations: Abdomen is soft. There is no mass.      Hernia: No hernia is present.      Comments: Unable to assess for tenderness/guarding as pt with altered mental status   Musculoskeletal:         General: Swelling present. Normal range of motion.      Cervical back: No rigidity.      Right lower leg: Edema present.      Left lower leg: Edema present.      Comments: B/l l/e pitting edema (3+)   Skin:     General: Skin is warm and dry.      Capillary Refill: Capillary refill takes less than 2 seconds.      Findings: No bruising, lesion or rash.   Neurological:      General: No focal deficit present.      Mental Status: She is alert and oriented to person, place, and time.      Comments: Follows commands           [x] ?I have reviewed the patient's vitals signs, the nursing notes, the physician's notes and other progress notes     [x] ?I have reviewed the laboratory and imaging data    [x] ?I have discussed this case with CCM team      Systems Based Assessment and Plan:    Neurologic:  Data/Assessment Reviewed    Diagnosis Altered mental status 2/2 encephalopathy   Overdose 2/2 IVDU  IVD withdrawal  Back pain    Course: Critical   Plan Continue quetiapine 50 mg bid  PRN pain medication: dilaudid 0.37m ivp prn  MRI Brain 8/23 negative  MRI Recommend imaging of  spine, preferably MRI of the cervical, thoracic, and lumbosacral spines  Day/night policy, reorientation as needed  PT/OT  Serial neuro checks       Cardiovascular:  Data/Assessment Most Recent Hemodynamics: Reviewed   Cardiovascular support: NA     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  Moderate TR  Mild Pulmonary HTN   Course Critical   Plan Endocarditis:   -Repeat TTE done and reviewed  -Continue Vanc  -Repeat BC every other day until negative       + 8/26; will repeat 8/28  -ID following; see above for MRI recommendation  -SCT evaluating pt for surgery-surgical time TBA   -Dental: EXT of remaining dentition (21-28) today    MAP goal  >65    CRRT for volume removal as tolerated      Respiratory:  Data/Assessment: CXR: Reviewed   Most recent VBG: Reviewed   Vent Settings: Reviewed      Diagnosis Acute respiratory failure 2/2 Aspiration PNA and altered mental status  Septic pulmonary emboli  Smoker     Course Critical   Plan   Volume removal on CRRT to achieve euvolemia  FIO2 wean as tolerated for O2 sats>92%  Pulm toilet; scheduled nebs       Renal:  Data/Assessment: Reviewed    Diagnosis Acute kidney injury - multifactorial: septic shock 2/2 MRSA TV endocarditis, IVDU  Hypervolemia and anasarca   Course critical   Plan Continue CRRT which was initiated 02/06/21- increase UF as tolerated to achieve euvolemia  Monitor electrolytes, clearance as needed  Nephrology involved; HD evaluation when off CRRT      Gastrointestinal:  Data/Assessment: NA for stress ulcer prophylaxis   Diet: NPO; speech to see    Bowel Regimen:  Colace, Miralax     Diagnosis Dysphagia  Enlarged liver  Distended gallbladder with mild elevation in Alk phos and bilirubin  constipation   Course  Critical    Plan Continue bowel regimen: colace, miralax  Continue stress ulcer prophylaxis   Speech to evaluate swallow     Monitor lfts and abdominal exam - if increasing bilirubin/alk phos and/or abdominal tenderness will obtain gallbladder  ultrasound and abdominal duplex     Endocrine:  Data/Assessment Last three Fingerstick glucose: Reviewed   Last HbA1C: NA  Most recent Thyroid results: 2.3     Diagnosis No active diagnosis   Course Stable   Plan Regular SSI every 6 hours  Goal 140-180       Hematologic:  Data/Assessment:  Reviewed    Diagnosis Anemia of chronic disease and Thrombocytopenia   Course Stable   Plan SCDs (Sequential Compression Device)  For DVT Prophylaxis  Sub q heparin  Continue to closely monitor cbc/coags      Infectious Diseases:  Data/Assessment Tmax last 24 hrs: No data recorded.       Diagnosis MRSA TV Endocarditis   Course Critical   Plan Antibiotic therapy  Agent:  Vancomycin  Indication: MRSA     repeat cultures; - follow up results  ID following; see above in Cardio    Will continue to monitor for additional s/s of infection     New non tunneled HD catheter placed 8/22 in right IJ         Family Communication and Disposition:  Data/Assessment I discussed pt's condition and plan of care in detail with pt's mother at bedside     I independently of the faculty provider spent a total of 31 minutes  in direct/indirect care of this patient including initial evaluation, review of laboratory, radiology, diagnostic studies, review of medical record, order entry and coordination of care.  Kathaleen Bury, APRN,AGACNP-BC  02/09/2021, 14:28        Critical Care Attending Attestation  I was present at the bedside of this critically ill patient.  I saw and examined the patient and discussed the patient with the CVICU team.  I agree with the current note and plan.    This patient suffers from IVDA, drug overdose, aspiration PNA requiring intubation, MRSA PNA and MRSA IE of TV w/moderate TR & bacteremia.  AKI requiring CRRT.  UDOA +heroin & methamphetamines.  S/p bronchoscopy 8/25.  Pt self-extubated 8/25; doing well on RA today. For OR today w/OMFS for dental procedure prior to open heart sx.  Decrease precedex gtt.  C/w Abx as per ID  recs.  MRI cervical/thoracic/lumbar ordered 2/2 MRSA bacteremia and back pain.  Pt has no l/e deficits (neurologically intact).  Will increase CRRT volume removal.  Pt BP stable, will consult nephro for iHD.  +Malnutrition-->f/u nutrition consult.  The care of this patient was in regard to managing condition(s) that have a high probability of sudden, clinically significant or life-threatening deterioration and require a high degree of attending physician attention.  The data reviewed and care planning were performed in direct proximity of the patient.  All critical care time was spent exclusive of procedures which will be documented elsewhere in the chart.  My critical care time is independent and unique to other providers.  Medications, allergies, vital signs, lab tests, imaging, nursing notes and physician notes have been reviewed.  75mn independent provider CCM time in overall assessment, review, documentation, family discussions; excludes procedures.  AJuan Quam MD  HVI CCM  .

## 2021-02-09 NOTE — Pharmacy (Signed)
[place] / [facility]  Therapeutic Drug Monitoring: Vancomycin  02/09/2021      Patient name: [name] [name], [name] [name]  Date of Birth:  02/24/1986    Actual Weight:  Weight: 71.4 kg (157 lb 6.5 oz) (02/08/21 1600)     BMI:  BMI (Calculated): 26.25 (02/08/21 1600)      Date RPh Current regimen (including mg/kg) Indication &  Organism AUC or trough based dosing Target Levels^ SCr (mg/dL) CrCl* (mL/min) Infectious Laboratory Markers (as applicable)   Measured level(s)   (mcg/mL) Calculated AUC (if AUC based monitoring) Plan & predicted AUC/trough if initial dosing (including when levels are due) Comments   8/22 [facility] intermittent dosing at outside hospital with dialysis endocarditis trough 13-17 6.21 12 WBC:11.3  Procal:  CRP: Vanc random 24.4  No Vanc at this time as the level is 24.4 - will proceed with intermittent dosing- dialysis plan unclear at this time - will clarify in the morning to determine further dosing/levels    8/23 TWS Intermittent Dosing As above As above 13-17 CVVHD CVVHD    - Start vancomycin 1000 mg IV q24h while patient is on CRRT  - If CRRT is stopped, discontinue scheduled dose and dose by levels    8/25 TWS Vancomycin 1000 mg IV q24h As above As above 13-17 CVVHD CVVHD  16h level = 19.4  - Continue current dosing  - Obtain trough on 8/26    8/26 [name]  Vanc 1000 mg (15 mg/kg AdjBW) q24h Endocarditis  Trough  13-17 CRRT CRRT WBC: 24.8  16.9 (drawn approp)  -  - Continue current regimen   - Recommend checking trough again in 2-3 days or sooner if clinically indicated (e.g. changing CRRT --> iHD)                                                       ^Target levels depends on dosing and monitoring method, AUC vs. trough based. For AUC based dosing units are mg*h/L. For trough based dosing units are mcg/mL.     *Creatinine clearance is estimated by using the Cockcroft-Gault equation for adult patients and the Carol Ada for pediatric patients.    The decision to  discontinue vancomycin therapy will be determined by the primary service.  Please contact the pharmacist with any questions regarding this patient's medication regimen.

## 2021-02-09 NOTE — Care Management Notes (Signed)
[facility]  Care Management Note    Patient Name: [name] [name]  Date of Birth: 07/20/1985  Sex: female  Date/Time of Admission: 02/05/2021 12:34 AM  Room/Bed: 19/A  Payor: [place] MEDICAID / Plan: [place] MEDICAID / Product Type: Medicaid [facility] /    LOS: 4 days   Primary Care Providers:  Pcp, No (General)    Admitting Diagnosis:  Endocarditis [I38]    Assessment:      02/09/21 1133   Assessment Detail   Assessment Type Continued Assessment   Date of Care Management Update 02/09/21   Date of Next DCP Update 02/12/21   Social Work Plan   Discharge Planning Status plan in progress   Projected Discharge Date 02/16/21   CM will evaluate for rehabilitation potential yes   Discharge Needs Assessment   Discharge Facility/Level of Care Needs Home vs Home with Home Health       Discharge Plan:  Home vs home with Home Health  Per chart review, pt self-extubated overnight, and is on O2 @ 3L NC. Pt remains on CRRT. ID, Nephrology, Oral Surgery following. Pt will be going to the OR today for multiple dental extractions. Pt is currently on IV vancomycin. D/C date and disposition TBD, pending treatment plan and pt's progress. MSW Jearld Shines will continue to follow.     The patient will continue to be evaluated for developing discharge needs.     Case Manager: Elease Hashimoto, [place]  Phone: [phone]

## 2021-02-09 NOTE — Care Plan (Signed)
Speech and Swallow Therapy    Pt is NPO for OR today. Will continue to follow as medically appropriate.    Laurence Aly, SLP  Pager [phone]

## 2021-02-09 NOTE — Anesthesia OR-ICU Handoff (Signed)
Anesthesia ICU Transfer of Care  [name] [name] is a 35 y.o. ,female, Weight: 71.4 kg (157 lb 6.5 oz)   had Procedure(s):  EXTRACTION TEETH MULTIPLE  performed  02/09/21   Primary Service: Lyla Son, MD    Past Medical History:   Diagnosis Date   . 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 MRSA (methicillin resistant Staphylococcus aureus) 02/05/2021    blood      Allergy History as of 02/09/21      No Known Allergies              I completed my ICU transfer of care/ Handoff to the ICU receiving personnel during which we discussed :  Access, Airway, All key and critical aspects of case discussed, Analgesia, Expectation of post procedure, Fluids/Product, Gave opportunity for questions and acknowledgement of understanding and PMHx                                                                         Last OR Temp: Temperature: (!) 38.3 C (100.9 F)  ABG:  PH (ARTERIAL)   Date Value Ref Range Status   02/06/2021 7.40 7.35 - 7.45 Final     PCO2 (ARTERIAL)   Date Value Ref Range Status   02/06/2021 33 (L) 35 - 45 mm/Hg Final     PCO2 (VENOUS)   Date Value Ref Range Status   02/08/2021 39 (L) 41 - 51 mm/Hg Final     PO2 (ARTERIAL)   Date Value Ref Range Status   02/06/2021 91 72 - 100 mm/Hg Final     PO2 (VENOUS)   Date Value Ref Range Status   02/08/2021 33 (L) 35 - 50 mm/Hg Final     SODIUM   Date Value Ref Range Status   02/08/2021 132 (L) 137 - 145 mmol/L Final     POTASSIUM   Date Value Ref Range Status   02/09/2021 4.9 3.5 - 5.1 mmol/L Final     KETONES   Date Value Ref Range Status   02/05/2021 10  (A) Negative mg/dL Final     WHOLE BLOOD POTASSIUM   Date Value Ref Range Status   02/08/2021 4.9 (H) 3.5 - 4.6 mmol/L Final     CHLORIDE   Date Value Ref Range Status   02/08/2021 101 101 - 111 mmol/L Final     CALCIUM   Date Value Ref Range Status   02/09/2021 7.6 (L) 8.5 - 10.0 mg/dL Final     Calculated P Axis   Date Value Ref Range Status   02/05/2021 41 degrees Final     Calculated R Axis   Date Value Ref Range  Status   02/05/2021 13 degrees Final     Calculated T Axis   Date Value Ref Range Status   02/05/2021 21 degrees Final     IONIZED CALCIUM   Date Value Ref Range Status   02/08/2021 1.15 1.10 - 1.35 mmol/L Final     LACTATE   Date Value Ref Range Status   02/08/2021 0.8 0.0 - 1.3 mmol/L Final     HEMOGLOBIN   Date Value Ref Range Status   02/08/2021 7.9 (L) 12.0 - 18.0 g/dL Final  OXYHEMOGLOBIN   Date Value Ref Range Status   02/08/2021 46.3 40.0 - 80.0 % Final     CARBOXYHEMOGLOBIN   Date Value Ref Range Status   02/08/2021 1.4 0.0 - 2.5 % Final     MET-HEMOGLOBIN   Date Value Ref Range Status   02/08/2021 0.6 0.0 - 2.0 % Final     BASE EXCESS   Date Value Ref Range Status   02/08/2021 3.6 (H) -3.0 - 3.0 mmol/L Final     BASE DEFICIT   Date Value Ref Range Status   02/06/2021 3.8 (H) 0.0 - 3.0 mmol/L Final   02/06/2021 2.7 -3.0 - 3.0 mmol/L Final     BICARBONATE (ARTERIAL)   Date Value Ref Range Status   02/06/2021 21.9 18.0 - 26.0 mmol/L Final     BICARBONATE (VENOUS)   Date Value Ref Range Status   02/08/2021 27.0 (H) 22.0 - 26.0 mmol/L Final     %FIO2 (VENOUS)   Date Value Ref Range Status   02/08/2021 21.0 % Final     Airway:* No LDAs found *

## 2021-02-09 NOTE — Care Plan (Signed)
Patient self extubated at 15:00. She was placed on 2L NC. No stridor noted. Continue with breathing treatments as ordered and encourage use of vpep and IS.

## 2021-02-09 NOTE — Anesthesia Preprocedure Evaluation (Addendum)
ANESTHESIA PRE-OP EVALUATION  Planned Procedure: EXTRACTION TEETH MULTIPLE (N/A Mouth)  Review of Systems     anesthesia history negative               Pulmonary   pneumonia,   Cardiovascular    Valvular problems/murmurs and MR and TR ,No peripheral edema, no past MI and no DVT,  Exercise Tolerance: <4 METS        GI/Hepatic/Renal    On CRRT  no GERD    dialysis treatment      Endo/Other         Neuro/Psych/MS  Chronic opioid use  Chronic IV drug use, fentanyl and heroin     Cancer    negative hematology/oncology ROS,                   Physical Assessment      Airway       Mallampati: II    TM distance: >3 FB    Neck ROM: limited  Mouth Opening: poor.  No Facial hair          Dental           (+) poor dentition           Pulmonary      (+) decreased breath sounds present        Cardiovascular    Rhythm: regular  Rate: Normal  (-) no friction rub, carotid bruit is not present, no peripheral edema and no murmur     Other findings            Plan  ASA 3 - emergent     Planned anesthesia type: general     general anesthesia with endotracheal tube intubation                  Intravenous induction     Anesthesia issues/risks discussed are: Dental Injuries, Nerve Injuries, Eye /Visual Loss, PONV, Aspiration, Intraoperative Awareness/ Recall, Stroke, Sore Throat, Cardiac Events/MI, Difficult Airway, Blood Loss and Post-op Agitation/Tantrum.  Anesthetic plan and risks discussed with patient  Signed consent obtained        Use of blood products discussed with patient who consented to blood products.     Patient's NPO status is appropriate for Anesthesia.                     Relevant Labs:    CBC with Diff (Last 24 Hours):  Recent Results last 24 hours     02/08/21  1119 02/08/21  1530 02/08/21  2032 02/09/21  0038   WBC  --   --   --  24.8*   HGB 8.1* 8.1* 8.3* 8.2*   HCT 25.1* 25.0* 25.1* 25.2*   MCV  --   --   --  75.7*   PLTCNT  --   --   --  167   PMNS  --   --   --  97   LYMPHOCYTES  --   --   --  0   MONOCYTES  --   --    --  3   EOSINOPHIL  --   --   --  1   BASOPHILS  --   --   --  0  <0.10     BMP (Last 48 Hours):  Recent Results in last 48 hours     02/07/21  1036 02/08/21  0100 02/08/21  1531 02/09/21  0038   SODIUM 133* 134*  132* 134*   POTASSIUM 4.5 4.5  --  4.9   CHLORIDE 102 105 101 101   CO2 24 25  --  24   BUN 46* 34*  --  24   CREATININE 4.05* 2.57*  --  1.93*   CALCIUM 7.7* 7.1*  --  7.6*   GLUCOSENF 127* 113  --  88     Imaging:  TTE on 02/05/21:  Tricuspid vegetation, mild mitral regurgitation, EF of 62%    EKG on 02/05/21:  NSR    CXR on : 02/08/21  ET tube present, subtle patchy opacities, Right IJ central catheter area of right atrium    CT on 02/05/21:  Patchy opacities with some cavitation consistent with septic emboli. Opacities in lung bases    Lines/Drains/Airways:  Trialysis Central line x 3 days  Right basilic PIV x 4 days  7.5  ETT x self extubated overnight    Previous Anesthesia:  Hysterectomy at outside facility per patient unknown date      Plan:  Type/Cross ordered on 8/22, most recent Hgb 8.2 - recommend type and screen in preparation for OR   Anesthesia considerations: given difficulty with mouth opening and limited ROM of neck, would suggest video laryngoscopy if planning to do general anesthetic.    Consent for general anesthesia was obtained from patient and all questions were answered.  Consent was witnessed and placed in the patient's chart.      Ranae Palms, DO/  PGY-2 Anesthesiology  [facility]  02/09/21 08:38

## 2021-02-09 NOTE — Consults (Signed)
INFECTIOUS DISEASE FOLLOW UP CONSULTATION    Patient Name: [name] [name] Number: [id number]  Date of Service: 02/10/2021  Date of Birth: [birth year]    Hospital Day:  LOS: 5 days     Reason for Consultation:  MRSA native tricuspid valve endocarditis    Subjective:  [name] [name] is doing relatively well this morning.  Her nurse reports that she was agitated overnight, climbing out of bed.  They were unable to do her MRI's of her spine due to the agitation.  Her CRRT is currently being held.  She reports continued back pain.  She is tolerating antibiotics with no rashes or diarrhea.    Objective:  Physical Exam:  Current Vitals: BP 104/64   Pulse (!) 109   Temp 37.8 C (100.1 F)   Resp (!) 31   Ht 1.651 m ('5\' 5"'$ )   Wt 65.6 kg (144 lb 10 oz)   SpO2 94%   BMI 24.07 kg/m       Vitals in last 24 hours: Temp  Avg: 37.3 C (99.2 F)  Min: 36.8 C (98.3 F)  Max: 38.3 C (100.9 F)  MAP (Non-Invasive)  Avg: 84.7 mmHG  Min: 56 mmHG  Max: 101 mmHG  Pulse  Avg: 118.2  Min: 104  Max: 128  Resp  Avg: 32.5  Min: 19  Max: 46  SpO2  Avg: 94.3 %  Min: 90 %  Max: 100 %  General: Patient is acutely ill-appearing, nontoxic, resting in a hospital bed in the CVICU.   Eyes: There is no conjunctival injection or scleral icterus.  There are no subconjunctival hemorrhages.  ENT:  She has poor dentition with multiple missing teeth and caries.  Neck:  A Trialysis catheter is in place in the right IJ.  Lungs:  There are crackles throughout all lung fields.  Cardiovascular: Heart is tachycardic with no murmur.  Abdomen: Soft, non-tender, nondistended with normal bowel sounds.    Skin:  She also has purplish purpuric lesions on her hands and feet.  Neurologic: Patient is alert and oriented.       Inpatient Medications:  acetaminophen (OFIRMEV) 10 mg/mL IV 1,000 mg, 1,000 mg, Intravenous, Q6H PRN  [Held by provider] acetaminophen (TYLENOL) tablet, 650 mg, Oral, Q4H PRN  [Held by provider] B complex-vitamin C-folic acid (NEPHRONEX) oral  liquid, 5 mL, Oral, Daily  bisacodyl (DULCOLAX) rectal suppository, 10 mg, Rectal, Daily PRN  budesonide (PULMICORT RESPULES) 0.5 mg/2 mL nebulizer suspension, 1 mg, Nebulization, 2x/day  dexmedeTOMIDine (PRECEDEX) 400 mcg in NS 100 mL (tot vol) infusion, 0.2 mcg/kg/hr (Adjusted), Intravenous, Continuous  [Held by provider] docusate sodium (COLACE) capsule, 100 mg, Oral, 2x/day  heparin 5,000 unit/mL injection, 5,000 Units, Subcutaneous, Q8HRS  HYDROmorphone (DILAUDID) 1 mg/mL injection, 0.2 mg, Intravenous, Q4H PRN  levalbuterol (XOPENEX) 1.25 mg/ 0.5 mL nebulizer solution, 1.25 mg, Nebulization, 3x/day   And  sodium chloride 3% nebulizer solution, 3 mL, Nebulization, 3x/day  miconazole nitrate (SECURA) 2% topical cream, , Apply Topically, 2x/day  NS bolus infusion 40 mL, 40 mL, Intravenous, Once PRN  NS flush syringe, 2-6 mL, Intracatheter, Q8HRS   And  NS flush syringe, 2-6 mL, Intracatheter, Q1 MIN PRN  NS flush syringe, 10-40 mL, Intravenous, Q8HRS  NS flush syringe, 20-30 mL, Intravenous, Q1 MIN PRN  ondansetron (ZOFRAN) 2 mg/mL injection, 4 mg, Intravenous, Q8H PRN  [Held by provider] polyethylene glycol (MIRALAX) oral packet, 17 g, Oral, Daily  [Held by provider] QUEtiapine (SEROQUEL) tablet, 50 mg, Oral, 2x/day  sodium  citrate 4% (3 mL) injection syringe, 2 Syringe, Intracatheter, Q1H PRN  SSIP insulin lispro 100 units/mL injection, 0-12 Units, Subcutaneous, Q4H PRN  vancomycin (VANCOCIN) 1 g in D5W 200 mL premix IVPB, 15 mg/kg (Adjusted), Intravenous, Q24H  Vancomycin IV - Pharmacist to Dose per Protocol, , Does not apply, Daily PRN        Current Antimicrobials:  Antibiotics (From admission, onward)    Start     Stop Route Frequency    02/06/21 2000  vancomycin (VANCOCIN) 1 g in D5W 200 mL premix IVPB         -- IV EVERY 24 HOURS    02/05/21 0226  Vancomycin IV Intermittent Dosing  Status:  Discontinued         08/23 1608 N/A DAILY PRN           Lines:  Patient Lines/Drains/Airways Status     Active Line  / Dialysis Catheter / Dialysis Graft / Drain / Airway / Wound     Name Placement date Placement time Site Days    Peripheral IV Ultrasound guided;Extended dwell catheter Left;Lower Cephalic  (lateral side of arm) 02/10/21  0532  -- less than 1    Dialysis Catheter Trialysis Catheter 02/06/21  0000  -- 4    Wound (Non-Surgical) Anterior;Right Elbow 02/05/21  0045  -- 5    Surgical Incision Inner Mouth 02/09/21  --  -- 1                 Laboratory Studies:  CBC Differential   Recent Labs     02/07/21  1036 02/08/21  0100 02/08/21  0706 02/08/21  1119 02/08/21  2032 02/09/21  0038 02/10/21  0248   WBC 17.4*   < > 20.1*  --   --  24.8* 20.4*   HGB 8.4*   < > 8.2*   < > 8.3* 8.2* 7.2*   HCT 24.7*   < > 24.9*   < > 25.1* 25.2* 22.7*   PLTCNT 130*   < > 148*  --   --  167 214   BANDS 2  --  2  --   --   --   --     < > = values in this interval not displayed.    Recent Labs     02/08/21  0706 02/09/21  0038 02/10/21  0248   PMNS 91 97 94   LYMPHOCYTES 3 0 2   MONOCYTES '1 3 4   '$ EOSINOPHIL 0 1 0   BASOPHILS 2  0.40* 0  <0.10 0  <0.10   PMNABS 18.69* 24.06* 19.18*   MONOSABS 0.20 0.74 0.82   EOSABS <0.10 0.25 <0.10      BMP LFTs   Recent Labs     02/10/21  0248   SODIUM 134*   POTASSIUM 5.2*   CHLORIDE 102   CO2 22   BUN 29*   CREATININE 2.15*   GLUCOSENF 97   ANIONGAP 10   BUNCRRATIO 13   GFR 30*   CALCIUM 7.3*   MAGNESIUM 3.0*   PHOSPHORUS 3.2   ALBUMIN 1.5*    Recent Labs     02/10/21  0248   ALBUMIN 1.5*      CoAgs Blood Gas:   No results found for this encounter No results found for this encounter    Cardiac Markers Lipid Panel   No results for input(s): TROPONINI, CKMB, MBINDEX, BNP in the last 72 hours. No results found  for this encounter   Urine Analysis Other Labs   No results found for this encounter No results found for this encounter    Invalid input(s): PRL     Microbiology:  Hospital Encounter on 02/05/21 (from the past 96 hour(s))   ADULT ROUTINE BLOOD CULTURE, SET OF 2 ADULT BOTTLES (BACTERIA AND YEAST)     Collection Time: 02/07/21  5:15 AM    Specimen: Blood   Culture Result Status    BLOOD CULTURE, ROUTINE Abnormal Stain (AA) Final    BLOOD CULTURE, ROUTINE Methicillin Resistant Staphylococcus aureus (A) Final    GRAM STAIN Gram Positive Cocci/Clusters (A) Final    GRAM STAIN Gram Positive Cocci/Clusters (A) Final    Narrative    Initiate Contact Isolation    For susceptibility, see previous report.     ADULT ROUTINE BLOOD CULTURE, SET OF 2 ADULT BOTTLES (BACTERIA AND YEAST)    Collection Time: 02/07/21  5:15 AM    Specimen: Blood   Culture Result Status    BLOOD CULTURE, ROUTINE Abnormal Stain (AA) Final    BLOOD CULTURE, ROUTINE Methicillin Resistant Staphylococcus aureus (A) Final    GRAM STAIN Gram Positive Cocci/Clusters (A) Final    GRAM STAIN Gram Positive Cocci/Clusters (A) Final    Narrative    Initiate Contact Isolation    For susceptibility, see previous report.     ADULT ROUTINE BLOOD CULTURE, SET OF 2 ADULT BOTTLES (BACTERIA AND YEAST)    Collection Time: 02/09/21  1:25 AM    Specimen: Blood   Culture Result Status    BLOOD CULTURE, ROUTINE Abnormal Stain (AA) Preliminary    GRAM STAIN Gram Positive Cocci/Clusters (A) Preliminary   ADULT ROUTINE BLOOD CULTURE, SET OF 2 ADULT BOTTLES (BACTERIA AND YEAST)    Collection Time: 02/09/21  1:37 AM    Specimen: Blood   Culture Result Status    BLOOD CULTURE, ROUTINE Abnormal Stain (AA) Preliminary    GRAM STAIN Gram Positive Cocci/Clusters (A) Preliminary       No results found for any visits on 02/05/21 (from the past 24 hour(s)).    Imaging Studies:  Results for orders placed or performed during the hospital encounter of 02/05/21 (from the past 72 hour(s))   XR AP MOBILE CHEST     Status: None    Narrative    [place]  Female, 35 years old.    XR AP MOBILE CHEST performed on 02/08/2021 5:20 AM.    REASON FOR EXAM:  ETT    TECHNIQUE: 1 views/1 images submitted for interpretation.    COMPARISON:  Chest x-ray from 02/07/2021.    FINDINGS: Subtle patchy  bilateral airspace opacities are noted. No lobar   consolidation, pleural effusions or pneumothorax. Cardiac size and central   pulmonary vasculature within normal limits. Visualized osseous structures   appear intact.    Endotracheal tube tip projects approximately 3.3 cm above the carina.  Enteric tube courses towards the left upper quadrant, tip not included.  Esophageal probe tip projects over the cervical esophagus.  Right IJ catheter tip projects over the expected area of the right atrium.        Impression    1.Subtle patchy bilateral airspace opacities are unchanged. No new   cardiopulmonary process.  2.Esophageal probe tip projects over the cervical esophagus. Repositioning   is recommended. Other support devices as above.   XR AP MOBILE CHEST     Status: None    Narrative    [id number]  Female, 35 years old.    XR AP MOBILE CHEST performed on 02/09/2021 5:34 AM.    REASON FOR EXAM:  Tricuspid valve endocarditis    TECHNIQUE: 1 views/1 images submitted for interpretation.    COMPARISON:  Chest x-ray from 02/08/2021.    FINDINGS: Subtle patchy bilateral airspace opacities are again noted, with   increased hazy opacities over the right mid/lower lung zones. No pleural   effusions or pneumothorax.   Query a small cavitary lesion within the right upper lung zone measuring   up to approximately 1.6 cm.  Cardiac size and central pulmonary vasculature within normal limits.   Visualized osseous structures appear intact.    Endotracheal tube, esophageal probe, and enteric tube has intervally been   removed.  Right IJ catheter tip projects over the expected area of the right atrium.      Impression    1.Persistent subtle patchy bilateral airspace opacities with nonspecific   increased hazy opacities over the right mid/lower lung zone.  2.Question of new small cavitary lesion within the right upper lung zone.   See image annotation. Given patient's clinical history of endocarditis   this raises concern for septic  emboli. CT evaluation should be considered   for further characterization.            Assessment:  This is a 35 y.o. female with a history of tobacco use, cleared hepatitis-C infection, nephrolithiasis and active injection drug use who was transferred to [facility] from [facility] on February 05, 2021 with MRSA native tricuspid valve endocarditis.  She presented to [facility] on January 31, 2021 with altered mental status, and was found to be in renal failure.  She was intubated and started on hemodialysis.  Blood and sputum cultures that grew MRSA.  On transfer to [place], a transthoracic echocardiogram confirmed a tricuspid valve vegetation.  CT of the chest shows bilateral airspace opacities some of which are cavitary likely representing septic emboli.  Blood cultures have remained persistently positive for MRSA.  She remains on CRRT.  MRI's of the spine are pending.    Recommendations:  1. Continue to follow blood cultures every other day until negative.    2. Patient will likely require a line holiday especially if blood cultures continue to be positive.  3. Plan from Cardiac Surgery is currently pending.  When patient is taken to the OR, please send OR specimens for gram stain, bacterial culture, and fungal stain and culture.  4. MRI's of the cervical, thoracic, and lumbosacral spine are pending.  5. Continue vancomycin dosing per pharmacy protocol.   6. Please consult Psychiatry due to history of substance abuse.     Please continue to follow CBC with differential, BUN, creatinine, hepatic function panel, and CRP while the patient remains on antimicrobial therapy.    We will continue following at this time.  Please call or page the Infectious Diseases Service with any questions regarding this patient.    On the day of the encounter, a total of 15 minutes was spent in direct/indirect care of this patient including evaluation, review and interpretation of laboratory and microbiology  studies, review of radiology studies and the medical record, order entry and coordination of care, counseling and educating the patient/family/caregiver, and documentation.    Marlene Lard, M.D.  Assistant Professor  Section of Infectious Diseases  [employer]  02/10/2021, 06:38

## 2021-02-10 ENCOUNTER — Inpatient Hospital Stay (HOSPITAL_COMMUNITY): Payer: Medicaid Other

## 2021-02-10 ENCOUNTER — Other Ambulatory Visit: Payer: Self-pay

## 2021-02-10 ENCOUNTER — Inpatient Hospital Stay (INDEPENDENT_AMBULATORY_CARE_PROVIDER_SITE_OTHER): Payer: Medicaid Other

## 2021-02-10 DIAGNOSIS — I368 Other nonrheumatic tricuspid valve disorders: Secondary | ICD-10-CM

## 2021-02-10 DIAGNOSIS — G9589 Other specified diseases of spinal cord: Secondary | ICD-10-CM

## 2021-02-10 DIAGNOSIS — E875 Hyperkalemia: Secondary | ICD-10-CM

## 2021-02-10 DIAGNOSIS — G928 Other toxic encephalopathy: Secondary | ICD-10-CM

## 2021-02-10 DIAGNOSIS — T6591XD Toxic effect of unspecified substance, accidental (unintentional), subsequent encounter: Secondary | ICD-10-CM

## 2021-02-10 LAB — MANUAL DIFF AND MORPHOLOGY-SYSMEX
BASOPHIL #: <0.1 10*3/uL (ref <=0.20)
BASOPHIL %: 0 %
EOSINOPHIL #: <0.1 10*3/uL (ref <=0.50)
EOSINOPHIL %: 0 %
LYMPHOCYTE #: 0.41 10*3/uL — ABNORMAL LOW (ref 1.00–4.80)
LYMPHOCYTE %: 2 %
MONOCYTE #: 0.82 10*3/uL (ref 0.20–1.10)
MONOCYTE %: 4 %
MYELOCYTE %: 1 %
NEUTROPHIL #: 19.18 10*3/uL — ABNORMAL HIGH (ref 1.50–7.70)
NEUTROPHIL %: 94 %
RBC MORPHOLOGY: NORMAL

## 2021-02-10 LAB — VANCOMYCIN, RANDOM: VANCOMYCIN RANDOM: 28.9 ug/mL — ABNORMAL LOW (ref 30.0–40.0)

## 2021-02-10 LAB — BASIC METABOLIC PANEL
ANION GAP: 10 mmol/L (ref 4–13)
BUN/CREA RATIO: 13 (ref 6–22)
BUN: 29 mg/dL — ABNORMAL HIGH (ref 8–25)
CALCIUM: 7.3 mg/dL — ABNORMAL LOW (ref 8.5–10.0)
CHLORIDE: 102 mmol/L (ref 96–111)
CO2 TOTAL: 22 mmol/L (ref 22–30)
CREATININE: 2.15 mg/dL — ABNORMAL HIGH (ref 0.60–1.05)
ESTIMATED GFR: 30 mL/min/BSA — ABNORMAL LOW (ref >=60)
GLUCOSE: 97 mg/dL (ref 65–125)
POTASSIUM: 5.2 mmol/L — ABNORMAL HIGH (ref 3.5–5.1)
SODIUM: 134 mmol/L — ABNORMAL LOW (ref 136–145)

## 2021-02-10 LAB — BPAM PACKED CELL ORDER: UNIT DIVISION: 0

## 2021-02-10 LAB — CBC WITH DIFF
HCT: 22.7 % — ABNORMAL LOW (ref 34.8–46.0)
HGB: 7.2 g/dL — ABNORMAL LOW (ref 11.5–16.0)
MCH: 24.7 pg — ABNORMAL LOW (ref 26.0–32.0)
MCHC: 31.7 g/dL (ref 31.0–35.5)
MCV: 77.7 fL — ABNORMAL LOW (ref 78.0–100.0)
MPV: 10.2 fL (ref 8.7–12.5)
PLATELETS: 214 10*3/uL (ref 150–400)
RBC: 2.92 10*6/uL — ABNORMAL LOW (ref 3.85–5.22)
RDW-CV: 22.7 % — ABNORMAL HIGH (ref 11.5–15.5)
WBC: 20.4 10*3/uL — ABNORMAL HIGH (ref 3.7–11.0)

## 2021-02-10 LAB — POC BLOOD GLUCOSE (RESULTS)
GLUCOSE, POC: 93 mg/dl (ref 70–105)
GLUCOSE, POC: 97 mg/dl (ref 70–105)
GLUCOSE, POC: 99 mg/dl (ref 70–105)

## 2021-02-10 LAB — ALBUMIN: ALBUMIN: 1.5 g/dL — ABNORMAL LOW (ref 3.5–5.0)

## 2021-02-10 LAB — ADULT ROUTINE BLOOD CULTURE, SET OF 2 BOTTLES (BACTERIA AND YEAST): BLOOD CULTURE, ROUTINE: ABNORMAL — CR

## 2021-02-10 LAB — PHOSPHORUS: PHOSPHORUS: 3.2 mg/dL (ref 2.4–4.7)

## 2021-02-10 LAB — MAGNESIUM: MAGNESIUM: 3 mg/dL — ABNORMAL HIGH (ref 1.8–2.6)

## 2021-02-10 MED ORDER — CALCIUM CHLORIDE 100 MG/ML (10 %) INTRAVENOUS SYRINGE
INJECTION | INTRAVENOUS | Status: AC
Start: 2021-02-10 — End: 2021-02-10

## 2021-02-10 MED ORDER — VANCOMYCIN IV - INTERMITTENT DOSING
Freq: Every day | Status: DC | PRN
Start: 2021-02-10 — End: 2021-02-17

## 2021-02-10 MED ORDER — INSULIN LISPRO 100 UNIT/ML SUB-Q SSIP
0.0000 [IU] | INJECTION | Freq: Four times a day (QID) | SUBCUTANEOUS | Status: DC | PRN
Start: 2021-02-10 — End: 2021-02-11
  Filled 2021-02-10: qty 300

## 2021-02-10 MED ORDER — VANCOMYCIN 10 GRAM INTRAVENOUS SOLUTION
500.0000 mg | Freq: Once | INTRAVENOUS | Status: AC
Start: 2021-02-10 — End: 2021-02-10
  Administered 2021-02-10: 500 mg via INTRAVENOUS
  Administered 2021-02-10: 0 mg via INTRAVENOUS
  Filled 2021-02-10: qty 5

## 2021-02-10 MED ORDER — CALCIUM CHLORIDE 100 MG/ML (10 %) INTRAVENOUS SYRINGE
INJECTION | INTRAVENOUS | Status: AC
Start: 2021-02-10 — End: 2021-02-10
  Administered 2021-02-10 (×2): 1000 mg via INTRAVENOUS
  Filled 2021-02-10: qty 10

## 2021-02-10 MED ORDER — ACETAMINOPHEN 1,000 MG/100 ML (10 MG/ML) INTRAVENOUS SOLUTION
1000.0000 mg | Freq: Four times a day (QID) | INTRAVENOUS | Status: DC | PRN
Start: 2021-02-10 — End: 2021-02-10
  Administered 2021-02-10 (×3): 1000 mg via INTRAVENOUS
  Administered 2021-02-10: 0 mg via INTRAVENOUS
  Filled 2021-02-10 (×2): qty 100

## 2021-02-10 MED ORDER — CALCIUM CHLORIDE 100 MG/ML (10 %) INTRAVENOUS SYRINGE
1000.0000 mg | INJECTION | Freq: Once | INTRAVENOUS | Status: AC
Start: 2021-02-10 — End: 2021-02-10

## 2021-02-10 MED ORDER — SODIUM CHLORIDE 0.9 % IV BOLUS
40.0000 mL | INJECTION | Freq: Once | Status: AC | PRN
Start: 2021-02-10 — End: 2021-02-10

## 2021-02-10 MED ORDER — HYDROMORPHONE 1 MG/ML INJECTION WRAPPER
0.4000 mg | INJECTION | Freq: Once | INTRAMUSCULAR | Status: AC
Start: 2021-02-10 — End: 2021-02-10
  Administered 2021-02-10: 0.4 mg via INTRAVENOUS

## 2021-02-10 MED ORDER — HYDROMORPHONE 1 MG/ML INJECTION WRAPPER
0.2000 mg | INJECTION | Freq: Once | INTRAMUSCULAR | Status: AC
Start: 2021-02-10 — End: 2021-02-10
  Administered 2021-02-10: 0.2 mg via INTRAVENOUS

## 2021-02-10 MED ORDER — SODIUM CITRATE 4 % (3 ML) INTRA-CATHETER INJECTION SYRINGE
2.0000 | INJECTION | Status: AC
Start: 2021-02-10 — End: 2021-02-10
  Administered 2021-02-10: 2
  Filled 2021-02-10 (×2): qty 6

## 2021-02-10 NOTE — Nurses Notes (Signed)
1950: MRI preformed. ICU nurse present during entire scan.Patient hooked up to monitor for VS. ICU nurse to monitor.
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
## 2021-02-10 NOTE — Consults (Signed)
Who INFECTIOUS DISEASE FOLLOW UP CONSULTATION    Patient Name: [name] [name] Number: [id number]  Date of Service: 02/11/2021  Date of Birth: 26-Jan-1986    Hospital Day:  LOS: 6 days     Reason for Consultation:  MRSA native tricuspid valve endocarditis    Subjective:  [name] is doing okay this morning.  She is feeling cold because she just ate some ice.  She denies any fevers.  She had MRI's of her spine last night.  She denies any current back pain.  She is off CRRT but did receive HD yesterday.    Objective:  Physical Exam:  Current Vitals: BP 114/63   Pulse (!) 113   Temp 37.2 C (98.9 F)   Resp (!) 24   Ht 1.651 m ('5\' 5"'$ )   Wt 65.6 kg (144 lb 10 oz)   SpO2 92%   BMI 24.07 kg/m       Vitals in last 24 hours: Temp  Avg: 37.3 C (99.2 F)  Min: 37.1 C (98.7 F)  Max: 37.7 C (99.9 F)  MAP (Non-Invasive)  Avg: 91.1 mmHG  Min: 66 mmHG  Max: 114 mmHG  Pulse  Avg: 122.4  Min: 113  Max: 135  Resp  Avg: 26.9  Min: 14  Max: 43  SpO2  Avg: 94.8 %  Min: 90 %  Max: 100 %  General: Patient is acutely ill-appearing, nontoxic, resting in a hospital bed in the CVICU.   Eyes: There is no conjunctival injection or scleral icterus.  There are no subconjunctival hemorrhages.  ENT:  She has poor dentition with multiple missing teeth and caries.  Neck:  A Trialysis catheter is in place in the right IJ.  Lungs:  There are crackles throughout all lung fields.  Cardiovascular: Heart is tachycardic with no murmur.  Abdomen: Soft, non-tender, nondistended with normal bowel sounds.    Skin:  She also has purplish purpuric lesions on her hands and feet which are stable compared to yesterday.  Neurologic: Patient is alert and oriented.       Inpatient Medications:  acetaminophen (TYLENOL) tablet, 650 mg, Oral, Q4H PRN  B complex-vitamin C-folic acid (NEPHRONEX) oral liquid, 5 mL, Oral, Daily  bisacodyl (DULCOLAX) rectal suppository, 10 mg, Rectal, Daily PRN  budesonide (PULMICORT RESPULES) 0.5 mg/2 mL nebulizer suspension,  1 mg, Nebulization, 2x/day  docusate sodium (COLACE) capsule, 100 mg, Oral, 2x/day  heparin 5,000 unit/mL injection, 5,000 Units, Subcutaneous, Q8HRS  HYDROmorphone (DILAUDID) 1 mg/mL injection, 0.2 mg, Intravenous, Q4H PRN  levalbuterol (XOPENEX) 1.25 mg/ 0.5 mL nebulizer solution, 1.25 mg, Nebulization, 3x/day   And  sodium chloride 3% nebulizer solution, 3 mL, Nebulization, 3x/day  miconazole nitrate (SECURA) 2% topical cream, , Apply Topically, 2x/day  NS flush syringe, 2-6 mL, Intracatheter, Q8HRS   And  NS flush syringe, 2-6 mL, Intracatheter, Q1 MIN PRN  NS flush syringe, 10-40 mL, Intravenous, Q8HRS  NS flush syringe, 20-30 mL, Intravenous, Q1 MIN PRN  ondansetron (ZOFRAN) 2 mg/mL injection, 4 mg, Intravenous, Q8H PRN  polyethylene glycol (MIRALAX) oral packet, 17 g, Oral, Daily  QUEtiapine (SEROQUEL) tablet, 50 mg, Oral, 2x/day  sodium citrate 4% (3 mL) injection syringe, 2 Syringe, Intracatheter, Q1H PRN  SSIP insulin lispro 100 units/mL injection, 0-12 Units, Subcutaneous, 4x/day PRN  Vancomycin IV - Pharmacist to Dose per Protocol, , Does not apply, Daily PRN  Vancomycin IV Intermittent Dosing, , Does not apply, Daily PRN        Current Antimicrobials:  Antibiotics (From admission, onward)    Start     Stop Route Frequency    02/06/21 2000  vancomycin (VANCOCIN) 1 g in D5W 200 mL premix IVPB         -- IV EVERY 24 HOURS    02/05/21 0226  Vancomycin IV Intermittent Dosing  Status:  Discontinued         08/23 1608 N/A DAILY PRN           Lines:  Patient Lines/Drains/Airways Status     Active Line / Dialysis Catheter / Dialysis Graft / Drain / Airway / Wound     Name Placement date Placement time Site Days    Peripheral IV Ultrasound guided;Extended dwell catheter Anterior;Right;Upper Arm 02/11/21  0453  -- less than 1    Dialysis Catheter Trialysis Catheter 02/06/21  0000  -- 5    Wound (Non-Surgical) Anterior;Right Elbow 02/05/21  0045  -- 6    Wound (Non-Surgical) Anterior;Right Finger 1;2;3;4;5 02/10/21   2000  -- less than 1    Wound (Non-Surgical) Anterior;Left Finger 1;2;3;4;5 02/10/21  2000  -- less than 1    Surgical Incision Inner Mouth 02/09/21  --  -- 2                 Laboratory Studies:  CBC Differential   Recent Labs     02/08/21  0706 02/08/21  1119 02/09/21  0038 02/10/21  0248 02/11/21  0019   WBC 20.1*  --  24.8* 20.4* 15.8*   HGB 8.2*   < > 8.2* 7.2* 7.8*   HCT 24.9*   < > 25.2* 22.7* 24.5*   PLTCNT 148*  --  167 214 203   BANDS 2  --   --   --   --     < > = values in this interval not displayed.    Recent Labs     02/08/21  0706 02/09/21  0038 02/10/21  0248 02/11/21  0019   PMNS 91 97 94 83   LYMPHOCYTES 3 0 2  --    MONOCYTES '1 3 4 8   '$ EOSINOPHIL 0 1 0  --    BASOPHILS 2  0.40* 0  <0.10 0  <0.10 0  <0.10   PMNABS 18.69* 24.06* 19.18* 13.18*   LYMPHSABS  --   --   --  0.91*   MONOSABS 0.20 0.74 0.82 1.31*   EOSABS <0.10 0.25 <0.10 <0.10      BMP LFTs   Recent Labs     02/11/21  0018   SODIUM 134*   POTASSIUM 4.4   CHLORIDE 102   CO2 24   BUN 22   CREATININE 2.12*   GLUCOSENF 97   ANIONGAP 8   BUNCRRATIO 10   GFR 31*   CALCIUM 7.8*   MAGNESIUM 2.3   PHOSPHORUS 2.6   ALBUMIN 1.6*    Recent Labs     02/11/21  0018   ALBUMIN 1.6*      CoAgs Blood Gas:   No results found for this encounter Recent Labs     02/11/21  0018   PH 7.51*       Cardiac Markers Lipid Panel   No results for input(s): TROPONINI, CKMB, MBINDEX, BNP in the last 72 hours. No results found for this encounter   Urine Analysis Other Labs   No results found for this encounter No results found for this encounter    Invalid input(s): PRL  Microbiology:  Hospital Encounter on 02/05/21 (from the past 96 hour(s))   ADULT ROUTINE BLOOD CULTURE, SET OF 2 ADULT BOTTLES (BACTERIA AND YEAST)    Collection Time: 02/09/21  1:25 AM    Specimen: Blood   Culture Result Status    BLOOD CULTURE, ROUTINE Abnormal Stain (AA) Preliminary    BLOOD CULTURE, ROUTINE Methicillin Resistant Staphylococcus aureus (A) Preliminary    GRAM STAIN Gram Positive  Cocci/Clusters (A) Preliminary       Susceptibility    Methicillin Resistant Staphylococcus aureus - MIC SUSCEPTIBILITY     Oxacillin >=4 Resistant mcg/mL     Gentamicin* <=0.5 Sensitive mcg/mL      * The role of gentamicin for the treatment of MSSA and MRSA infections is in combination with a cell-wall acting agent in the setting of prosthetic valve endocarditis.     Erythromycin >=8 Resistant mcg/mL     Clindamycin 0.25 Sensitive mcg/mL     Daptomycin 0.5 Sensitive mcg/mL     Tetracycline <=1 Sensitive mcg/mL     Doxycycline <=0.5 Sensitive mcg/mL     Linezolid 2 Sensitive mcg/mL     Vancomycin 1 Sensitive mcg/mL     Rifampin* <=0.5 Sensitive mcg/mL      * Rifampin should not be used alone for the treatment of Staphylococcus due to the quick emergence of resistance. For use in combination with other active agents for prosthetic material biofilm penetration.      Trimethoprim/Sulfamethoxazole <=10 Sensitive mcg/mL   ADULT ROUTINE BLOOD CULTURE, SET OF 2 ADULT BOTTLES (BACTERIA AND YEAST)    Collection Time: 02/09/21  1:37 AM    Specimen: Blood   Culture Result Status    BLOOD CULTURE, ROUTINE Abnormal Stain (AA) Preliminary    BLOOD CULTURE, ROUTINE Methicillin Resistant Staphylococcus aureus (A) Preliminary    GRAM STAIN Gram Positive Cocci/Clusters (A) Preliminary       No results found for any visits on 02/05/21 (from the past 24 hour(s)).    Imaging Studies:  Results for orders placed or performed during the hospital encounter of 02/05/21 (from the past 72 hour(s))   XR AP MOBILE CHEST     Status: None    Narrative    [place]  Female, 35 years old.    XR AP MOBILE CHEST performed on 02/09/2021 5:34 AM.    REASON FOR EXAM:  Tricuspid valve endocarditis    TECHNIQUE: 1 views/1 images submitted for interpretation.    COMPARISON:  Chest x-ray from 02/08/2021.    FINDINGS: Subtle patchy bilateral airspace opacities are again noted, with   increased hazy opacities over the right mid/lower lung zones. No pleural    effusions or pneumothorax.   Query a small cavitary lesion within the right upper lung zone measuring   up to approximately 1.6 cm.  Cardiac size and central pulmonary vasculature within normal limits.   Visualized osseous structures appear intact.    Endotracheal tube, esophageal probe, and enteric tube has intervally been   removed.  Right IJ catheter tip projects over the expected area of the right atrium.      Impression    1.Persistent subtle patchy bilateral airspace opacities with nonspecific   increased hazy opacities over the right mid/lower lung zone.  2.Question of new small cavitary lesion within the right upper lung zone.   See image annotation. Given patient's clinical history of endocarditis   this raises concern for septic emboli. CT evaluation should be considered   for further characterization.   XR  AP MOBILE CHEST     Status: None    Narrative    Brisha Ferrara  Female, 35 years old.    XR AP MOBILE CHEST performed on 02/10/2021 6:39 AM.    REASON FOR EXAM:  Tricuspid valve endocarditis    TECHNIQUE: 1 views/1 images submitted for interpretation.    COMPARISON:  Chest radiograph dated 02/09/2021.    FINDINGS:      Support devices: A right IJ central line is stable in position with the   tip projecting over the mid to lower right atrium.    The heart is at the upper limits of normal. There are bilateral hazy   opacities of the lung, with a more focal consolidative area over the right   midlung. There is also a cavitary appearance within the right upper and   lower lung zones.      Impression    Stable lung aeration with cavitary appearance as over the right upper and   lower lung zones, suspect septic emboli.     MRI SPINE CERVICAL WO CONTRAST     Status: None    Narrative    Kimaya Goatley  Female, 35 years old.    MRI SPINE CERVICAL WO CONTRAST, MRI SPINE LUMBOSACRAL WO CONTRAST, MRI SPINE THORACIC WO CONTRAST performed on 02/10/2021 9:43 AM.    REASON FOR EXAM:  bacteremia      TECHNIQUE: MRI of the  cervical, thoracic and lumbar spine were performed without contrast. 3 plane localizer, coronal STIR, sagittal T2, sagittal T1, axial T2, axial T1 and cervical GRE sequences were obtained.    COMPARISON: CT chest abdomen and pelvis 02/03/2021.    FINDINGS:    Study is degraded by motion artifact. Evaluation for discitis-osteomyelitis and fluid collections are compromised by the lack of IV contrast administration.    There is diffusely low vertebral body marrow signal, which is within normal limits for the patient's age. There is no intradiscal fluid collection, endplate marrow edema or bony erosion to suggest discitis-osteomyelitis in the cervical, thoracic and lumbar spine.    The alignment is preserved. Vertebral body heights and disc spaces are normal. Posterior elements are intact. The cord and cauda equina are normal in caliber and signal. Incidental low-lying conus at L3-L4. There is no fibrofatty lesion of the filum terminale. No dysraphism is identified in the imaged lumbar and sacral spine. Within the confines of motion artifact, there is no convincing dorsal dermal sinus tract. No myelomeningocele is seen. No fluid collections are seen. The cervical, thoracic and lumbar spinal canal and neural foramina are normal in caliber.    Prevertebral soft tissues are normal. Multiple peripheral nodular airspace opacities consistent with septic-embolic disease are better appreciated on the prior CT chest from 02/05/2021.      Impression    1.Within the confines of an unenhanced study, there is no evidence of discitis-osteomyelitis or fluid collections in the cervical, thoracic and lumbar spine.  2.No cord signal abnormality or cord compression.  3.No spinal canal or neural foraminal stenosis.  4.Incidental low-lying conus at L3-L4. No associated fibrofatty lesion of the filum terminale, dysraphism of the imaged spine or myelomeningocele. No convincing dorsal dermal sinus tract.  5.Pulmonary septic-embolic disease is  better assessed on the prior CT chest from 02/05/2021.   MRI SPINE THORACIC WO CONTRAST     Status: None    Narrative    Gentry Yuhasz  Female, 35 years old.    MRI SPINE CERVICAL WO CONTRAST, MRI SPINE  LUMBOSACRAL WO CONTRAST, MRI SPINE THORACIC WO CONTRAST performed on 02/10/2021 9:43 AM.    REASON FOR EXAM:  bacteremia      TECHNIQUE: MRI of the cervical, thoracic and lumbar spine were performed without contrast. 3 plane localizer, coronal STIR, sagittal T2, sagittal T1, axial T2, axial T1 and cervical GRE sequences were obtained.    COMPARISON: CT chest abdomen and pelvis 02/03/2021.    FINDINGS:    Study is degraded by motion artifact. Evaluation for discitis-osteomyelitis and fluid collections are compromised by the lack of IV contrast administration.    There is diffusely low vertebral body marrow signal, which is within normal limits for the patient's age. There is no intradiscal fluid collection, endplate marrow edema or bony erosion to suggest discitis-osteomyelitis in the cervical, thoracic and lumbar spine.    The alignment is preserved. Vertebral body heights and disc spaces are normal. Posterior elements are intact. The cord and cauda equina are normal in caliber and signal. Incidental low-lying conus at L3-L4. There is no fibrofatty lesion of the filum terminale. No dysraphism is identified in the imaged lumbar and sacral spine. Within the confines of motion artifact, there is no convincing dorsal dermal sinus tract. No myelomeningocele is seen. No fluid collections are seen. The cervical, thoracic and lumbar spinal canal and neural foramina are normal in caliber.    Prevertebral soft tissues are normal. Multiple peripheral nodular airspace opacities consistent with septic-embolic disease are better appreciated on the prior CT chest from 02/05/2021.      Impression    1.Within the confines of an unenhanced study, there is no evidence of discitis-osteomyelitis or fluid collections in the cervical, thoracic  and lumbar spine.  2.No cord signal abnormality or cord compression.  3.No spinal canal or neural foraminal stenosis.  4.Incidental low-lying conus at L3-L4. No associated fibrofatty lesion of the filum terminale, dysraphism of the imaged spine or myelomeningocele. No convincing dorsal dermal sinus tract.  5.Pulmonary septic-embolic disease is better assessed on the prior CT chest from 02/05/2021.   MRI SPINE LUMBOSACRAL WO CONTRAST     Status: None    Narrative    Avon Biello  Female, 35 years old.    MRI SPINE CERVICAL WO CONTRAST, MRI SPINE LUMBOSACRAL WO CONTRAST, MRI SPINE THORACIC WO CONTRAST performed on 02/10/2021 9:43 AM.    REASON FOR EXAM:  bacteremia      TECHNIQUE: MRI of the cervical, thoracic and lumbar spine were performed without contrast. 3 plane localizer, coronal STIR, sagittal T2, sagittal T1, axial T2, axial T1 and cervical GRE sequences were obtained.    COMPARISON: CT chest abdomen and pelvis 02/03/2021.    FINDINGS:    Study is degraded by motion artifact. Evaluation for discitis-osteomyelitis and fluid collections are compromised by the lack of IV contrast administration.    There is diffusely low vertebral body marrow signal, which is within normal limits for the patient's age. There is no intradiscal fluid collection, endplate marrow edema or bony erosion to suggest discitis-osteomyelitis in the cervical, thoracic and lumbar spine.    The alignment is preserved. Vertebral body heights and disc spaces are normal. Posterior elements are intact. The cord and cauda equina are normal in caliber and signal. Incidental low-lying conus at L3-L4. There is no fibrofatty lesion of the filum terminale. No dysraphism is identified in the imaged lumbar and sacral spine. Within the confines of motion artifact, there is no convincing dorsal dermal sinus tract. No myelomeningocele is seen. No fluid collections are seen.  The cervical, thoracic and lumbar spinal canal and neural foramina are normal in  caliber.    Prevertebral soft tissues are normal. Multiple peripheral nodular airspace opacities consistent with septic-embolic disease are better appreciated on the prior CT chest from 02/05/2021.      Impression    1.Within the confines of an unenhanced study, there is no evidence of discitis-osteomyelitis or fluid collections in the cervical, thoracic and lumbar spine.  2.No cord signal abnormality or cord compression.  3.No spinal canal or neural foraminal stenosis.  4.Incidental low-lying conus at L3-L4. No associated fibrofatty lesion of the filum terminale, dysraphism of the imaged spine or myelomeningocele. No convincing dorsal dermal sinus tract.  5.Pulmonary septic-embolic disease is better assessed on the prior CT chest from 02/05/2021.       Results for orders placed or performed during the hospital encounter of 02/05/21 (from the past 24 hour(s))   XR AP MOBILE CHEST     Status: None    Narrative    [place]  Female, 35 years old.    XR AP MOBILE CHEST performed on 02/10/2021 6:39 AM.    REASON FOR EXAM:  Tricuspid valve endocarditis    TECHNIQUE: 1 views/1 images submitted for interpretation.    COMPARISON:  Chest radiograph dated 02/09/2021.    FINDINGS:      Support devices: A right IJ central line is stable in position with the   tip projecting over the mid to lower right atrium.    The heart is at the upper limits of normal. There are bilateral hazy   opacities of the lung, with a more focal consolidative area over the right   midlung. There is also a cavitary appearance within the right upper and   lower lung zones.      Impression    Stable lung aeration with cavitary appearance as over the right upper and   lower lung zones, suspect septic emboli.     MRI SPINE CERVICAL WO CONTRAST     Status: None    Narrative    Stavroula Fiebelkorn  Female, 35 years old.    MRI SPINE CERVICAL WO CONTRAST, MRI SPINE LUMBOSACRAL WO CONTRAST, MRI SPINE THORACIC WO CONTRAST performed on 02/10/2021 9:43 AM.    REASON FOR  EXAM:  bacteremia      TECHNIQUE: MRI of the cervical, thoracic and lumbar spine were performed without contrast. 3 plane localizer, coronal STIR, sagittal T2, sagittal T1, axial T2, axial T1 and cervical GRE sequences were obtained.    COMPARISON: CT chest abdomen and pelvis 02/03/2021.    FINDINGS:    Study is degraded by motion artifact. Evaluation for discitis-osteomyelitis and fluid collections are compromised by the lack of IV contrast administration.    There is diffusely low vertebral body marrow signal, which is within normal limits for the patient's age. There is no intradiscal fluid collection, endplate marrow edema or bony erosion to suggest discitis-osteomyelitis in the cervical, thoracic and lumbar spine.    The alignment is preserved. Vertebral body heights and disc spaces are normal. Posterior elements are intact. The cord and cauda equina are normal in caliber and signal. Incidental low-lying conus at L3-L4. There is no fibrofatty lesion of the filum terminale. No dysraphism is identified in the imaged lumbar and sacral spine. Within the confines of motion artifact, there is no convincing dorsal dermal sinus tract. No myelomeningocele is seen. No fluid collections are seen. The cervical, thoracic and lumbar spinal canal and neural foramina are  normal in caliber.    Prevertebral soft tissues are normal. Multiple peripheral nodular airspace opacities consistent with septic-embolic disease are better appreciated on the prior CT chest from 02/05/2021.      Impression    1.Within the confines of an unenhanced study, there is no evidence of discitis-osteomyelitis or fluid collections in the cervical, thoracic and lumbar spine.  2.No cord signal abnormality or cord compression.  3.No spinal canal or neural foraminal stenosis.  4.Incidental low-lying conus at L3-L4. No associated fibrofatty lesion of the filum terminale, dysraphism of the imaged spine or myelomeningocele. No convincing dorsal dermal sinus  tract.  5.Pulmonary septic-embolic disease is better assessed on the prior CT chest from 02/05/2021.   MRI SPINE THORACIC WO CONTRAST     Status: None    Narrative    Suzan Lirette  Female, 34 years old.    MRI SPINE CERVICAL WO CONTRAST, MRI SPINE LUMBOSACRAL WO CONTRAST, MRI SPINE THORACIC WO CONTRAST performed on 02/10/2021 9:43 AM.    REASON FOR EXAM:  bacteremia      TECHNIQUE: MRI of the cervical, thoracic and lumbar spine were performed without contrast. 3 plane localizer, coronal STIR, sagittal T2, sagittal T1, axial T2, axial T1 and cervical GRE sequences were obtained.    COMPARISON: CT chest abdomen and pelvis 02/03/2021.    FINDINGS:    Study is degraded by motion artifact. Evaluation for discitis-osteomyelitis and fluid collections are compromised by the lack of IV contrast administration.    There is diffusely low vertebral body marrow signal, which is within normal limits for the patient's age. There is no intradiscal fluid collection, endplate marrow edema or bony erosion to suggest discitis-osteomyelitis in the cervical, thoracic and lumbar spine.    The alignment is preserved. Vertebral body heights and disc spaces are normal. Posterior elements are intact. The cord and cauda equina are normal in caliber and signal. Incidental low-lying conus at L3-L4. There is no fibrofatty lesion of the filum terminale. No dysraphism is identified in the imaged lumbar and sacral spine. Within the confines of motion artifact, there is no convincing dorsal dermal sinus tract. No myelomeningocele is seen. No fluid collections are seen. The cervical, thoracic and lumbar spinal canal and neural foramina are normal in caliber.    Prevertebral soft tissues are normal. Multiple peripheral nodular airspace opacities consistent with septic-embolic disease are better appreciated on the prior CT chest from 02/05/2021.      Impression    1.Within the confines of an unenhanced study, there is no evidence of discitis-osteomyelitis  or fluid collections in the cervical, thoracic and lumbar spine.  2.No cord signal abnormality or cord compression.  3.No spinal canal or neural foraminal stenosis.  4.Incidental low-lying conus at L3-L4. No associated fibrofatty lesion of the filum terminale, dysraphism of the imaged spine or myelomeningocele. No convincing dorsal dermal sinus tract.  5.Pulmonary septic-embolic disease is better assessed on the prior CT chest from 02/05/2021.   MRI SPINE LUMBOSACRAL WO CONTRAST     Status: None    Narrative    Nikoleta Curley  Female, 35 years old.    MRI SPINE CERVICAL WO CONTRAST, MRI SPINE LUMBOSACRAL WO CONTRAST, MRI SPINE THORACIC WO CONTRAST performed on 02/10/2021 9:43 AM.    REASON FOR EXAM:  bacteremia      TECHNIQUE: MRI of the cervical, thoracic and lumbar spine were performed without contrast. 3 plane localizer, coronal STIR, sagittal T2, sagittal T1, axial T2, axial T1 and cervical GRE sequences were obtained.  COMPARISON: CT chest abdomen and pelvis 02/03/2021.    FINDINGS:    Study is degraded by motion artifact. Evaluation for discitis-osteomyelitis and fluid collections are compromised by the lack of IV contrast administration.    There is diffusely low vertebral body marrow signal, which is within normal limits for the patient's age. There is no intradiscal fluid collection, endplate marrow edema or bony erosion to suggest discitis-osteomyelitis in the cervical, thoracic and lumbar spine.    The alignment is preserved. Vertebral body heights and disc spaces are normal. Posterior elements are intact. The cord and cauda equina are normal in caliber and signal. Incidental low-lying conus at L3-L4. There is no fibrofatty lesion of the filum terminale. No dysraphism is identified in the imaged lumbar and sacral spine. Within the confines of motion artifact, there is no convincing dorsal dermal sinus tract. No myelomeningocele is seen. No fluid collections are seen. The cervical, thoracic and lumbar spinal  canal and neural foramina are normal in caliber.    Prevertebral soft tissues are normal. Multiple peripheral nodular airspace opacities consistent with septic-embolic disease are better appreciated on the prior CT chest from 02/05/2021.      Impression    1.Within the confines of an unenhanced study, there is no evidence of discitis-osteomyelitis or fluid collections in the cervical, thoracic and lumbar spine.  2.No cord signal abnormality or cord compression.  3.No spinal canal or neural foraminal stenosis.  4.Incidental low-lying conus at L3-L4. No associated fibrofatty lesion of the filum terminale, dysraphism of the imaged spine or myelomeningocele. No convincing dorsal dermal sinus tract.  5.Pulmonary septic-embolic disease is better assessed on the prior CT chest from 02/05/2021.       Assessment:  This is a 35 y.o. female with a history of tobacco use, cleared hepatitis-C infection, nephrolithiasis and active injection drug use who was transferred to [facility] from [facility] on February 05, 2021 with MRSA native tricuspid valve endocarditis.  She presented to [facility] on January 31, 2021 with altered mental status, and was found to be in renal failure.  She was intubated and started on hemodialysis.  Blood and sputum cultures that grew MRSA.  On transfer to [place], a transthoracic echocardiogram confirmed a tricuspid valve vegetation.  CT of the chest shows bilateral airspace opacities some of which are cavitary likely representing septic emboli.  Blood cultures have remained persistently positive for MRSA.  She is now on intermittent HD.  MRI's of the spine without contrast showed no evidence of discitis or epidural abscess.    Recommendations:  1. Continue to follow blood cultures every other day until negative.    2. Patient will likely require a line holiday especially if blood cultures continue to be positive.  3. Plan from Cardiac Surgery is currently pending.  When patient is  taken to the OR, please send OR specimens for gram stain, bacterial culture, and fungal stain and culture.  4. Continue vancomycin dosing per pharmacy protocol.   5. Please consult Psychiatry due to history of substance abuse.     Please continue to follow CBC with differential, BUN, creatinine, hepatic function panel, and CRP while the patient remains on antimicrobial therapy.    We will continue following at this time.  Please call or page the Infectious Diseases Service with any questions regarding this patient.    On the day of the encounter, a total of 15 minutes was spent in direct/indirect care of this patient including evaluation, review and interpretation of  laboratory and microbiology studies, review of radiology studies and the medical record, order entry and coordination of care, counseling and educating the patient/family/caregiver, and documentation.    Marlene Lard, M.D.  Assistant Professor  Section of Infectious Diseases  [employer]  02/11/2021, 06:20

## 2021-02-10 NOTE — Care Plan (Signed)
Speech and Swallow Therapy    Attempted to see pt for swallow evaluation, but pt was at MRI at this time. Will continue to follow as pt is appropriate.     Laurence Aly, SLP  Pager [phone]

## 2021-02-10 NOTE — Nurses Notes (Signed)
Pt tolerated dialysis TX well. Net UF 2591m . Report given to bedside RN .

## 2021-02-10 NOTE — Pharmacy (Signed)
[place] / [facility]  Therapeutic Drug Monitoring: Vancomycin  02/10/2021      Patient name: [name] [name], [name] [name]  Date of Birth:  12/02/1985    Actual Weight:  Weight: 65.6 kg (144 lb 10 oz) (02/10/21 0600)     BMI:  BMI (Calculated): 24.12 (02/10/21 0600)      Date RPh Current regimen (including mg/kg) Indication &  Organism AUC or trough based dosing Target Levels^ SCr (mg/dL) CrCl* (mL/min) Infectious Laboratory Markers (as applicable)   Measured level(s)   (mcg/mL) Calculated AUC (if AUC based monitoring) Plan & predicted AUC/trough if initial dosing (including when levels are due) Comments   8/22 [facility] intermittent dosing at outside hospital with dialysis endocarditis trough 13-17 6.21 12 WBC:11.3  Procal:  CRP: Vanc random 24.4  No Vanc at this time as the level is 24.4 - will proceed with intermittent dosing- dialysis plan unclear at this time - will clarify in the morning to determine further dosing/levels    8/23 TWS Intermittent Dosing As above As above 13-17 CVVHD CVVHD    - Start vancomycin 1000 mg IV q24h while patient is on CRRT  - If CRRT is stopped, discontinue scheduled dose and dose by levels    8/25 TWS Vancomycin 1000 mg IV q24h As above As above 13-17 CVVHD CVVHD  16h level = 19.4  - Continue current dosing  - Obtain trough on 8/26    8/26 [name]  Vanc 1000 mg (15 mg/kg AdjBW) q24h Endocarditis  Trough  13-17 CRRT CRRT WBC: 24.8  16.9 (drawn approp)  -  - Continue current regimen   - Recommend checking trough again in 2-3 days or sooner if clinically indicated (e.g. changing CRRT --> iHD)    8/27 [name] Vancomycin 1000 mg IV q24h As above Trough 20-25 iHD iHD    28.9 pre-iHD level  - Will give supplemental dose after iHD of vancomycin 500 mg IV x 1, random with am labs tomorrow given unclear iHD schedule.                                       Katherina Mires levels depends on dosing and monitoring method, AUC vs. trough based. For AUC based dosing units are mg*h/L. For trough  based dosing units are mcg/mL.     *Creatinine clearance is estimated by using the Cockcroft-Gault equation for adult patients and the Carol Ada for pediatric patients.    The decision to discontinue vancomycin therapy will be determined by the primary service.  Please contact the pharmacist with any questions regarding this patient's medication regimen.

## 2021-02-10 NOTE — Care Plan (Signed)
Problem: Adult Inpatient Plan of Care  Goal: Plan of Care Review  Outcome: Ongoing (see interventions/notes)  Flowsheets (Taken 02/09/2021 2000)  Plan of Care Reviewed With: patient  Goal: Patient-Specific Goal (Individualized)  Outcome: Ongoing (see interventions/notes)  Flowsheets (Taken 02/09/2021 2000)  Individualized Care Needs: monitor CRRT  Anxieties, Fears or Concerns: (UTA) --  Patient-Specific Goals (Include Timeframe): reduce stimuli and promote rest to allow for adequate sleep by end of shift  Goal: Absence of Hospital-Acquired Illness or Injury  Outcome: Ongoing (see interventions/notes)  Intervention: Identify and Manage Fall Risk  Recent Flowsheet Documentation  Taken 02/10/2021 0000 by Hillery Aldo, RN  Safety Promotion/Fall Prevention: safety round/check completed  Taken 02/09/2021 2300 by Hillery Aldo, RN  Safety Promotion/Fall Prevention: safety round/check completed  Taken 02/09/2021 2200 by Hillery Aldo, RN  Safety Promotion/Fall Prevention: safety round/check completed  Taken 02/09/2021 2100 by Hillery Aldo, RN  Safety Promotion/Fall Prevention: safety round/check completed  Taken 02/09/2021 2000 by Hillery Aldo, RN  Safety Promotion/Fall Prevention:   activity supervised   fall prevention program maintained   muscle strengthening facilitated   motion sensor pad activated   safety round/check completed  Intervention: Prevent Skin Injury  Recent Flowsheet Documentation  Taken 02/10/2021 0000 by Hillery Aldo, RN  Body Position:   positioned/repositioned independently   turned q 2 hours  Taken 02/09/2021 2300 by Hillery Aldo, RN  Body Position:   positioned/repositioned independently   turned q 2 hours  Taken 02/09/2021 2200 by Hillery Aldo, RN  Body Position:   positioned/repositioned independently   turned q 2 hours  Taken 02/09/2021 2100 by Hillery Aldo, RN  Body Position:   positioned/repositioned  independently   turned q 2 hours  Taken 02/09/2021 2000 by Hillery Aldo, RN  Body Position:   positioned/repositioned independently   turned q 2 hours  Intervention: Prevent and Manage VTE (Venous Thromboembolism) Risk  Recent Flowsheet Documentation  Taken 02/09/2021 2000 by Hillery Aldo, RN  VTE Prevention/Management:   anticoagulant therapy maintained   dorsiflexion/plantar flexion performed  Goal: Optimal Comfort and Wellbeing  Outcome: Ongoing (see interventions/notes)  Intervention: Provide Person-Centered Care  Recent Flowsheet Documentation  Taken 02/09/2021 2000 by Hillery Aldo, RN  Trust Relationship/Rapport:   care explained   choices provided   thoughts/feelings acknowledged  Goal: Rounds/Family Conference  Outcome: Ongoing (see interventions/notes)     Problem: Skin Injury Risk Increased  Goal: Skin Health and Integrity  Outcome: Ongoing (see interventions/notes)  Intervention: Optimize Skin Protection  Recent Flowsheet Documentation  Taken 02/10/2021 0000 by Hillery Aldo, RN  Head of Bed [facility]) Positioning: HOB elevated  Taken 02/09/2021 2300 by Hillery Aldo, RN  Head of Bed [facility]) Positioning: HOB elevated  Taken 02/09/2021 2200 by Hillery Aldo, RN  Head of Bed [facility]) Positioning: HOB elevated  Taken 02/09/2021 2100 by Hillery Aldo, RN  Head of Bed [facility]) Positioning: HOB elevated  Taken 02/09/2021 2000 by Hillery Aldo, RN  Pressure Reduction Techniques:   (L,M,H,VH)Frequent Turning   (L,M,H,VH) Turn Schedule - Minimum every 2 hours   (L,M,H,VH) Maximal Remobilization   (M,H,VH) 30 degree Lateral Positioning   (L,M,H,VH) Manage Moisture, Nutrition & Shear   (H,VH) Supplemented with Small Shifts   frequent weight shift encouraged   (H,VH) Increase Frequency of Turning   pressure points protected  Pressure Reduction Devices:   (L.M,H,VH) Pressure redistributing mattress utilized   (L.M,H,VH) Heel offloading device utilized    (L.M,H,VH) Specialty Bed utilized   (L.M,H,VH) Pressure Redistributing Chair cushion utilized   (M,H,VH) Use  Repositioning Devices or Pillows  Skin Protection:   adhesive use limited   electrode sites changed   preventative decubiti skin protection foam dressing applied/intact   tubing/devices free from skin contact   transparent dressing maintained  Head of Bed (HOB) Positioning: HOB elevated     Problem: Infection Progression (Sepsis)  Goal: Absence of Infection Signs and Symptoms  Outcome: Ongoing (see interventions/notes)  Intervention: Promote Stabilization  Recent Flowsheet Documentation  Taken 02/09/2021 2000 by Hillery Aldo, RN  Fever Reduction/Comfort Measures:   lightweight bedding   lightweight clothing  Intervention: Promote Recovery  Recent Flowsheet Documentation  Taken 02/09/2021 2000 by Hillery Aldo, RN  Sleep/Rest Enhancement:   awakenings minimized   noise level reduced  Activity Management:   dorsiflexion, plantar flexion encouraged   activity clustered for rest periods   ROM, active encouraged     Problem: Fall Injury Risk  Goal: Absence of Fall and Fall-Related Injury  Outcome: Ongoing (see interventions/notes)  Intervention: Identify and Manage Contributors  Recent Flowsheet Documentation  Taken 02/09/2021 2000 by Hillery Aldo, RN  Self-Care Promotion: independence encouraged  Intervention: Promote Injury-Free Environment  Recent Flowsheet Documentation  Taken 02/10/2021 0000 by Hillery Aldo, RN  Safety Promotion/Fall Prevention: safety round/check completed  Taken 02/09/2021 2300 by Hillery Aldo, RN  Safety Promotion/Fall Prevention: safety round/check completed  Taken 02/09/2021 2200 by Hillery Aldo, RN  Safety Promotion/Fall Prevention: safety round/check completed  Taken 02/09/2021 2100 by Hillery Aldo, RN  Safety Promotion/Fall Prevention: safety round/check completed  Taken 02/09/2021 2000 by Hillery Aldo,  RN  Safety Promotion/Fall Prevention:   activity supervised   fall prevention program maintained   muscle strengthening facilitated   motion sensor pad activated   safety round/check completed

## 2021-02-10 NOTE — Progress Notes (Signed)
[facility]  HVI Critical Care Consult/Progress Note    [name] [name], [name] [name], 35 y.o. female  Date of Birth: 04-Nov-1985  Medical Record Number:  [id number]   Inpatient Admission Date: 02/05/2021   Hospital Day:  LOS: 0 days     Chief Complaint: Altered Mental Status   History of Present Illness: Per previous providers: "Pt is a 35 yo female with a significant PMH for Smoking and IVDU presented to [name] with AMS on 01/31/21. She was found to have a drug overdose, aspiration PNA requiring intubation, MRSA bacteremia, large TV endocarditis with moderate TR and elevated systemic pulmonary pressures (49mhg), AKI requiring HD, anemia requiring PRBC and thrombocytopenia. Urine drug screen + for heroin and methamphetamines.    [facility]   [facility]: [facility]  8/22: See above.   -Prop for sedation. Agitated; MAE; does not follow. Repeat CT Head/CAP. Hemodynamics stable. Intubated. Repeated blood cultures. Continue Vanc.   8/23: had pan CT 8/22, still not awakening/folowing commands on precedex  But agitated. MRI brain ordered, started on CRRT  8/24: remains on CRRT, MRI Brain no abnormality, following commands on dexmedetomidine, quetiapine, prn dilaudid  8/25 brocnh today for increased ETT secretions,plan for OR early next week, OMFS following for dental root extriction.  Pt self-extubated this afternoon.  Doing well on NC.  8/26: Intermittent delirium. Weak. Continue Seroquel.  Hemodynamics stable. NC. Aggressive pulm toilet. Continue nebs. Speech to see. Dental procedure today. Continues + BC. ID following; MRI CTLS ordered. Continue antibiotics. Surgical plan TBA.   8/27:  MRI completed today. Intermittent dex. Surgical plan TBA. HD today per nephology. Bedside speech and swallow. Blood repeat Cx tomorrow.    Past Medical History: No past  medical history on file.    Past Surgical History: No past surgical history on file.   Social History:  Social History           Socioeconomic History   . Marital status: Divorced     Spouse name: Not on file   . Number of children: Not on file   . Years of education: Not on file   . Highest education level: Not on file   Occupational History   . Not on file   Tobacco Use   . Smoking status: Not on file   . Smokeless tobacco: Not on file   Substance and Sexual Activity   . Alcohol use: Not on file   . Drug use: Not on file   . Sexual activity: Not on file   Other Topics Concern   . Not on file   Social History Narrative   . Not on file     Social Determinants of Health     Financial Resource Strain: Not on file   Food Insecurity: Not on file   Transportation Needs: Not on file   Physical Activity: Not on file   Stress: Not on file   Intimate Partner Violence: Not on file   Housing Stability: Not on file      Family History: Family Medical History:       None             Allergies: No Known Allergies   ROS: Constitutional: denies fever/chills, appreciates generalized weakness and back pain   Cardiac: denies chest pain, palpatation, syncope  Respiratory: reports mild shortness of breath  GI: denies  N/V/D  All other systems reviewed and negative     Problem List:       Patient Active Problem List   Diagnosis   . Endocarditis       Medications:  Scheduled Meds: chlorhexidine gluconate, 15 mL, 2x/day  docusate sodium, 100 mg, 2x/day  famotidine, 10 mg, Daily  NS flush, 2-6 mL, Q8HRS  perflutrn lipid microspheres (DEFINITY) DILUTION injection, 2 mL, Give in Cardiology  propofoL, ,   senna concentrate, 10 mL, 2x/day      IV Infusions: propofoL, Last Rate: 20 mcg/kg/min (02/05/21 0220)      PRN:  NS flush, 2-6 mL, Q1 MIN PRN  insulin lispro, 0-12 Units, Q4H PRN  Pharmacy to Dose Vancomycin, , Daily PRN  Vancomycin IV Intermittent Dosing, , Daily PRN        Last VS:    Heart Rate: (!) 126  Respiratory  Rate: (!) 23  BP (Non-Invasive): 130/84    Physical Exam  Vitals reviewed.   Constitutional:       Appearance: She is ill-appearing.      Comments: Critically ill appearing female, no acute distress, becomes restless at times   HENT:      Mouth/Throat:      Mouth: Mucous membranes are moist.   Eyes:      General: No scleral icterus.        Right eye: No discharge.         Left eye: No discharge.      Pupils: Pupils are equal, round, and reactive to light.   Cardiovascular:      Rate and Rhythm: Regular rhythm. Tachycardia present.      Pulses: Normal pulses.   Pulmonary:      Effort: Pulmonary effort is normal. No respiratory distress.      Comments: Diminished breath sounds throughout  NC  Abdominal:      General: Abdomen is flat. There is no distension.      Palpations: Abdomen is soft. There is no mass.      Hernia: No hernia is present.      Comments: Unable to assess for tenderness/guarding as pt with altered mental status   Musculoskeletal:         General: Swelling present. Normal range of motion.      Cervical back: No rigidity.      Right lower leg: Edema present.      Left lower leg: Edema present.      Comments: B/l l/e pitting edema (3+)   Skin:     General: Skin is warm and dry.      Capillary Refill: Capillary refill takes less than 2 seconds.      Findings: No bruising, lesion or rash.   Neurological:      General: No focal deficit present.      Mental Status: She is alert and oriented to person, place, and time.      Comments: Follows commands           _0 ?I have reviewed the patient's vitals signs, the nursing notes, the physician's notes and other progress notes     _1 ?I have reviewed the laboratory and imaging data    _2 ?I have discussed this case with CCM team      Systems Based Assessment and Plan:    Neurologic:  Data/Assessment Reviewed    Diagnosis Altered mental status 2/2 encephalopathy   Overdose 2/2 IVDU  IVD withdrawal  Back pain    Course: Critical  Plan Continue quetiapine  50 mg bid  PRN pain medication: dilaudid 0.71m ivp prn  MRI Brain 8/23 negative  MRI of the cervical, thoracic, and lumbosacral spine completed today to r/o abscess   Day/night policy, reorientation as needed  PT/OT  Serial neuro checks       Cardiovascular:  Data/Assessment Most Recent Hemodynamics: Reviewed   Cardiovascular support: NA     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  Moderate TR  Mild Pulmonary HTN   Course Critical   Plan Endocarditis:   -Repeat TTE done and reviewed  -Continue Vanc  -Repeat BC every other day until negative       + 8/26; will repeat 8/28 (tomorrow, ordered)  -ID following; see above for MRI recommendation  -SCT evaluating pt for surgery-surgical time TBA   -Dental: EXT of remaining dentition (21-28) today    MAP goal  >65    HD for volume removal and clearance      Respiratory:  Data/Assessment: CXR: Reviewed   Most recent VBG: Reviewed   Vent Settings: Reviewed      Diagnosis Acute respiratory failure 2/2 Aspiration PNA and altered mental status  Septic pulmonary emboli  Smoker     Course Critical   Plan   Volume removal on HD to achieve euvolemia  FIO2 wean as tolerated for O2 sats>92%  Pulm toilet; scheduled nebs       Renal:  Data/Assessment: Reviewed    Diagnosis Acute kidney injury - multifactorial: septic shock 2/2 MRSA TV endocarditis, IVDU  Hypervolemia and anasarca   Course critical   Plan Monitor electrolytes, clearance as needed  Nephrology involved; HD today.      Gastrointestinal:  Data/Assessment: NA for stress ulcer prophylaxis   Diet: Thickened liquids    Bowel Regimen:  Colace, Miralax     Diagnosis Dysphagia  Enlarged liver  Distended gallbladder with mild elevation in Alk phos and bilirubin  constipation   Course  Critical    Plan Continue bowel regimen: colace, miralax  Continue stress ulcer prophylaxis   Speech/bedside to evaluate swallow     Monitor lfts and abdominal exam - if increasing bilirubin/alk phos and/or abdominal tenderness will obtain  gallbladder ultrasound and abdominal duplex     Endocrine:  Data/Assessment Last three Fingerstick glucose: Reviewed   Last HbA1C: NA  Most recent Thyroid results: 2.3     Diagnosis No active diagnosis   Course Stable   Plan Regular SSI every 6 hours  Goal 140-180       Hematologic:  Data/Assessment:  Reviewed    Diagnosis Anemia of chronic disease and Thrombocytopenia   Course Stable   Plan SCDs (Sequential Compression Device)  For DVT Prophylaxis  Sub q heparin  Continue to closely monitor cbc/coags      Infectious Diseases:  Data/Assessment Tmax last 24 hrs: No data recorded.       Diagnosis MRSA TV Endocarditis   Course Critical   Plan Antibiotic therapy  Agent:  Vancomycin  Indication: MRSA     repeat cultures; - follow up results  ID following; see above in Cardio    Will continue to monitor for additional s/s of infection     New non tunneled HD catheter placed 8/22 in right IJ         Family Communication and Disposition:  Data/Assessment I discussed pt's condition and plan of care in detail with pt's mother at bedside     I independently of the faculty provider spent a total  of 39 minutes in direct/indirect care of this patient including initial evaluation, review of laboratory, radiology, diagnostic studies, review of medical record, order entry and coordination of care.  Kathaleen Bury, APRN,AGACNP-BC  02/10/2021, 13:26          .Critical Care Attending Attestation  I was present at the bedside of this critically ill patient. I saw and examined the patient and discussed the patient with the CVICU team. I agree with the current note and plan.   This patient suffers from IVDA, drug overdose, aspiration PNA requiring intubation, MRSAPNA and MRSA IE of TV w/moderate TR &bacteremia. AKI requiring CRRT. UDOA +heroin &methamphetamines. S/p bronchoscopy 8/25. Pt self-extubated 8/25; doing well on RA today. S/p OR (8/26) w/OMFS for dental extractions prior to open heart sx. Decreased precedex gtt.  C/w Abx as per ID recs.  MRI cervical/thoracic/lumbar completed.  Pt has no l/e deficits (neurologically intact).  Pt BP stable, will consult nephro for iHD, CRRT clotted off overnight.  +Malnutrition-->f/u nutrition consult.  Pt's mother updated at bedside.  The care of this patient was in regard to managing condition(s) that have a high probability of sudden, clinically significant or life-threatening deterioration and require a high degree of attending physician attention. The data reviewed and care planning were performed in direct proximity of the patient. All critical care time was spent exclusive of procedures which will be documented elsewhere in the chart. My critical care time is independent and unique to other providers. Medications, allergies, vital signs, lab tests, imaging, nursing notes and physician notes have been reviewed.  74mn independentprovider CCM time in overall assessment, review, documentation, family discussions; excludes procedures.  AJuan Quam MD  [facility] CCM

## 2021-02-11 ENCOUNTER — Inpatient Hospital Stay (HOSPITAL_COMMUNITY): Payer: Medicaid Other | Admitting: Radiology

## 2021-02-11 ENCOUNTER — Inpatient Hospital Stay (HOSPITAL_COMMUNITY): Payer: Medicaid Other

## 2021-02-11 DIAGNOSIS — Z87442 Personal history of urinary calculi: Secondary | ICD-10-CM

## 2021-02-11 DIAGNOSIS — M549 Dorsalgia, unspecified: Secondary | ICD-10-CM

## 2021-02-11 DIAGNOSIS — Z452 Encounter for adjustment and management of vascular access device: Secondary | ICD-10-CM

## 2021-02-11 DIAGNOSIS — I361 Nonrheumatic tricuspid (valve) insufficiency: Secondary | ICD-10-CM

## 2021-02-11 DIAGNOSIS — I079 Rheumatic tricuspid valve disease, unspecified: Secondary | ICD-10-CM

## 2021-02-11 DIAGNOSIS — I330 Acute and subacute infective endocarditis: Secondary | ICD-10-CM

## 2021-02-11 DIAGNOSIS — I313 Pericardial effusion (noninflammatory): Secondary | ICD-10-CM

## 2021-02-11 DIAGNOSIS — R918 Other nonspecific abnormal finding of lung field: Secondary | ICD-10-CM

## 2021-02-11 LAB — ADULT ROUTINE BLOOD CULTURE, SET OF 2 BOTTLES (BACTERIA AND YEAST)
BLOOD CULTURE, ROUTINE: ABNORMAL — CR
BLOOD CULTURE, ROUTINE: ABNORMAL — CR

## 2021-02-11 LAB — BASIC METABOLIC PANEL
ANION GAP: 8 mmol/L (ref 4–13)
BUN/CREA RATIO: 10 (ref 6–22)
BUN: 22 mg/dL (ref 8–25)
CALCIUM: 7.8 mg/dL — ABNORMAL LOW (ref 8.5–10.0)
CHLORIDE: 102 mmol/L (ref 96–111)
CO2 TOTAL: 24 mmol/L (ref 22–30)
CREATININE: 2.12 mg/dL — ABNORMAL HIGH (ref 0.60–1.05)
ESTIMATED GFR: 31 mL/min/BSA — ABNORMAL LOW (ref >=60)
GLUCOSE: 97 mg/dL (ref 65–125)
POTASSIUM: 4.4 mmol/L (ref 3.5–5.1)
SODIUM: 134 mmol/L — ABNORMAL LOW (ref 136–145)

## 2021-02-11 LAB — CBC WITH DIFF
BASOPHIL #: <0.1 10*3/uL (ref <=0.20)
BASOPHIL %: 0 %
EOSINOPHIL #: <0.1 10*3/uL (ref <=0.50)
EOSINOPHIL %: 0 %
HCT: 24.5 % — ABNORMAL LOW (ref 34.8–46.0)
HGB: 7.8 g/dL — ABNORMAL LOW (ref 11.5–16.0)
IMMATURE GRANULOCYTE #: 0.39 10*3/uL — ABNORMAL HIGH (ref <0.10)
IMMATURE GRANULOCYTE %: 3 % — ABNORMAL HIGH (ref 0–1)
LYMPHOCYTE #: 0.91 10*3/uL — ABNORMAL LOW (ref 1.00–4.80)
LYMPHOCYTE %: 6 %
MCH: 25.2 pg — ABNORMAL LOW (ref 26.0–32.0)
MCHC: 31.8 g/dL (ref 31.0–35.5)
MCV: 79 fL (ref 78.0–100.0)
MONOCYTE #: 1.31 10*3/uL — ABNORMAL HIGH (ref 0.20–1.10)
MONOCYTE %: 8 %
MPV: 10.2 fL (ref 8.7–12.5)
NEUTROPHIL #: 13.18 10*3/uL — ABNORMAL HIGH (ref 1.50–7.70)
NEUTROPHIL %: 83 %
PLATELETS: 203 10*3/uL (ref 150–400)
RBC: 3.1 10*6/uL — ABNORMAL LOW (ref 3.85–5.22)
RDW-CV: 22 % — ABNORMAL HIGH (ref 11.5–15.5)
WBC: 15.8 10*3/uL — ABNORMAL HIGH (ref 3.7–11.0)

## 2021-02-11 LAB — TRANSTHORACIC ECHOCARDIOGRAM - ADULT
TR VELOCITY: 319.59 cm/s
TR VELOCITY: 328.87 cm/s
TR VELOCITY: 328.87 cm/s

## 2021-02-11 LAB — PHOSPHORUS: PHOSPHORUS: 2.6 mg/dL (ref 2.4–4.7)

## 2021-02-11 LAB — PROTEIN/CREATININE RATIO, URINE, RANDOM
CREATININE RANDOM URINE: 157 mg/dL — ABNORMAL HIGH (ref 50–100)
PROTEIN RANDOM URINE: 762 mg/dL
PROTEIN/CREATININE RATIO RANDOM URINE: 4854 mg/g — ABNORMAL HIGH (ref 10–105)

## 2021-02-11 LAB — ALBUMIN: ALBUMIN: 1.6 g/dL — ABNORMAL LOW (ref 3.5–5.0)

## 2021-02-11 LAB — VANCOMYCIN, RANDOM: VANCOMYCIN RANDOM: 29 ug/mL — ABNORMAL LOW (ref 30.0–40.0)

## 2021-02-11 LAB — MAGNESIUM: MAGNESIUM: 2.3 mg/dL (ref 1.8–2.6)

## 2021-02-11 LAB — IONIZED CALCIUM
IONIZED CALCIUM: 1.13 mmol/L (ref 1.10–1.35)
PH (VENOUS): 7.51 (ref 7.31–7.41)

## 2021-02-11 LAB — CREATINE KINASE (CK), TOTAL, SERUM: CREATINE KINASE: 47 U/L (ref 25–190)

## 2021-02-11 MED ORDER — SODIUM CHLORIDE 0.9 % INJECTION SOLUTION
2.0000 mL | INTRAVENOUS | Status: DC
Start: 2021-02-11 — End: 2021-02-12

## 2021-02-11 MED ORDER — ACETAZOLAMIDE 500 MG SOLUTION FOR INJECTION
500.0000 mg | Freq: Once | INTRAMUSCULAR | Status: AC
Start: 2021-02-11 — End: 2021-02-11
  Administered 2021-02-11: 500 mg via INTRAVENOUS
  Filled 2021-02-11: qty 5

## 2021-02-11 MED ORDER — SODIUM CHLORIDE 3 % FOR NEBULIZATION
3.0000 mL | INHALATION_SOLUTION | Freq: Four times a day (QID) | RESPIRATORY_TRACT | Status: DC | PRN
Start: 2021-02-11 — End: 2021-03-02
  Administered 2021-02-14: 3 mL via RESPIRATORY_TRACT
  Filled 2021-02-11: qty 4

## 2021-02-11 MED ORDER — FUROSEMIDE 10 MG/ML INJECTION SOLUTION
200.0000 mg | Freq: Once | INTRAMUSCULAR | Status: AC
Start: 2021-02-11 — End: 2021-02-11
  Administered 2021-02-11: 0 mg via INTRAVENOUS
  Administered 2021-02-11: 200 mg via INTRAVENOUS
  Filled 2021-02-11: qty 20

## 2021-02-11 MED ORDER — LEVALBUTEROL CONCENTRATE 1.25 MG/0.5 ML SOLUTION FOR NEBULIZATION
1.2500 mg | INHALATION_SOLUTION | Freq: Four times a day (QID) | RESPIRATORY_TRACT | Status: DC | PRN
Start: 2021-02-11 — End: 2021-02-13

## 2021-02-11 NOTE — Respiratory Therapy (Signed)
Pt did not want NEB TX at this time  Pt had been refusing NEB TX'S   Made TX'S PRN  If needed   Will continue to monitor pt airway and oxygen requirements

## 2021-02-11 NOTE — Progress Notes (Signed)
[facility]  HVI Critical Care Consult/Progress Note    [name] [name], [name] [name], 35 y.o. female  Date of Birth: 03/08/1986  Medical Record Number:  [id number]   Inpatient Admission Date: 02/05/2021   Hospital Day:  LOS: 0 days     Chief Complaint: Altered Mental Status   History of Present Illness: Per previous providers: "Pt is a 35 yo female with a significant PMH for Smoking and IVDU presented to [name] with AMS on 01/31/21. She was found to have a drug overdose, aspiration PNA requiring intubation, MRSA bacteremia, large TV endocarditis with moderate TR and elevated systemic pulmonary pressures (55mhg), AKI requiring HD, anemia requiring PRBC and thrombocytopenia. Urine drug screen + for heroin and methamphetamines.    [facility]."   [facility]: [facility]  8/[facility]: See above.   -Prop for sedation. Agitated; MAE; does not follow. Repeat CT Head/CAP. Hemodynamics stable. Intubated. Repeated blood cultures. Continue Vanc.   8/23: had pan CT 8/22, still not awakening/folowing commands on precedex  But agitated. MRI brain ordered, started on CRRT  8/24: remains on CRRT, MRI Brain no abnormality, following commands on dexmedetomidine, quetiapine, prn dilaudid  8/25 brocnh today for increased ETT secretions,plan for OR early next week, OMFS following for dental root extriction.  Pt self-extubated this afternoon.  Doing well on NC.  8/26: Intermittent delirium. Weak. Continue Seroquel.  Hemodynamics stable. NC. Aggressive pulm toilet. Continue nebs. Speech to see. Dental procedure today. Continues + BC. ID following; MRI CTLS ordered. Continue antibiotics. Surgical plan TBA.   8/27:  MRI completed today. Intermittent dex. Surgical plan TBA. HD today per nephology. Bedside speech and swallow. Blood repeat Cx tomorrow.   8/28 repeat TTE today for OR later  this week, HD holiday will give 200 mg Lasix for diuresis (followed by diuril 5064mif no response w/lasix) and D/C Niagra for line holiday   Past Medical History: No past medical history on file.    Past Surgical History: No past surgical history on file.   Social History:  Social History           Socioeconomic History   . Marital status: Divorced     Spouse name: Not on file   . Number of children: Not on file   . Years of education: Not on file   . Highest education level: Not on file   Occupational History   . Not on file   Tobacco Use   . Smoking status: Not on file   . Smokeless tobacco: Not on file   Substance and Sexual Activity   . Alcohol use: Not on file   . Drug use: Not on file   . Sexual activity: Not on file   Other Topics Concern   . Not on file   Social History Narrative   . Not on file     Social Determinants of Health     Financial Resource Strain: Not on file   Food Insecurity: Not on file   Transportation Needs: Not on file   Physical Activity: Not on file   Stress: Not on file   Intimate Partner Violence: Not on file   Housing Stability: Not on file      Family History: Family Medical History:       None             Allergies:  No Known Allergies   ROS: Constitutional: denies fever/chills, appreciates generalized weakness and back pain   Cardiac: denies chest pain, palpatation, syncope  Respiratory: reports mild shortness of breath  GI: denies N/V/D  All other systems reviewed and negative     Problem List:       Patient Active Problem List   Diagnosis   . Endocarditis       Medications:  Scheduled Meds: chlorhexidine gluconate, 15 mL, 2x/day  docusate sodium, 100 mg, 2x/day  famotidine, 10 mg, Daily  NS flush, 2-6 mL, Q8HRS  perflutrn lipid microspheres (DEFINITY) DILUTION injection, 2 mL, Give in Cardiology  propofoL, ,   senna concentrate, 10 mL, 2x/day      IV Infusions: propofoL, Last Rate: 20 mcg/kg/min (02/05/21 0220)      PRN:  NS flush, 2-6 mL, Q1 MIN PRN  insulin  lispro, 0-12 Units, Q4H PRN  Pharmacy to Dose Vancomycin, , Daily PRN  Vancomycin IV Intermittent Dosing, , Daily PRN        Last VS:    Heart Rate: (!) 126  Respiratory Rate: (!) 23  BP (Non-Invasive): 130/84    Physical Exam  Vitals reviewed.   Constitutional:       Appearance: She is ill-appearing.      Comments: Critically ill appearing female, no acute distress, becomes restless at times   HENT:      Mouth/Throat:      Mouth: Mucous membranes are moist.   Eyes:      General: No scleral icterus.        Right eye: No discharge.         Left eye: No discharge.      Pupils: Pupils are equal, round, and reactive to light.   Cardiovascular:      Rate and Rhythm: Regular rhythm. Tachycardia present.      Pulses: Normal pulses.   Pulmonary:      Effort: Pulmonary effort is normal. No respiratory distress.      Comments: Diminished breath sounds throughout  NC  Abdominal:      General: Abdomen is flat. There is no distension.      Palpations: Abdomen is soft. There is no mass.      Hernia: No hernia is present.   Musculoskeletal:         General: Swelling present. Normal range of motion.      Cervical back: No rigidity.      Right lower leg: Edema present.      Left lower leg: Edema present.      Comments: B/l l/e pitting edema (3+)   Skin:     General: Skin is warm and dry.      Capillary Refill: Capillary refill takes less than 2 seconds.      Findings: No bruising, lesion or rash.   Neurological:      General: No focal deficit present.      Mental Status: She is alert.           _0 ?I have reviewed the patient's vitals signs, the nursing notes, the physician's notes and other progress notes     _1 ?I have reviewed the laboratory and imaging data    _2 ?I have discussed this case with CCM team      Systems Based Assessment and Plan:    Neurologic:  Data/Assessment Reviewed    Diagnosis Altered mental status 2/2 encephalopathy   Overdose 2/2 IVDU  IVD withdrawal  Back pain    Course:  Critical   Plan Continue  quetiapine 50 mg bid  PRN pain medication: dilaudid 0.46m ivp prn  MRI Brain 8/23 negative  MRI of the cervical, thoracic, and lumbosacral spine completed to r/o abscess   Day/night policy, reorientation as needed  PT/OT  Serial neuro checks       Cardiovascular:  Data/Assessment Most Recent Hemodynamics: Reviewed   Cardiovascular support: NA     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  Moderate TR  Mild Pulmonary HTN   Course Critical   Plan Endocarditis:   -Repeat TTE done and reviewed  -Continue Vanc  -Repeat BC every other day until negative       + 8/26; will repeat 8/28 (tomorrow, ordered)  -ID following; see above for MRI recommendation  -SCT evaluating pt for surgery-surgical time TBA   -Dental: EXT of remaining dentition (21-28)     MAP goal  >660mg       Respiratory:  Data/Assessment: CXR: Reviewed   Most recent VBG: Reviewed   Vent Settings: Reviewed      Diagnosis Acute respiratory failure 2/2 Aspiration PNA and altered mental status  Septic pulmonary emboli  Smoker     Course Critical   Plan FIO2 wean as tolerated for O2 sats>92%  Pulm toilet; scheduled nebs       Renal:  Data/Assessment: Reviewed    Diagnosis Acute kidney injury - multifactorial: septic shock 2/2 MRSA TV endocarditis, IVDU  Hypervolemia and anasarca   Course critical   Plan Monitor electrolytes, clearance as needed  Will D/C Niagra and give a line holiday, will diuresis with 200 mg Lasix     Gastrointestinal:  Data/Assessment: NA for stress ulcer prophylaxis   Diet: Thickened liquids    Bowel Regimen:  Colace, Miralax     Diagnosis Dysphagia  Enlarged liver  Distended gallbladder with mild elevation in Alk phos and bilirubin  constipation   Course  Critical    Plan Continue bowel regimen: colace, miralax  Continue stress ulcer prophylaxis   Speech/bedside to evaluate swallow   Monitor lfts and abdominal exam - if increasing bilirubin/alk phos and/or abdominal tenderness will obtain gallbladder ultrasound and abdominal duplex      Endocrine:  Data/Assessment Last three Fingerstick glucose: Reviewed   Last HbA1C: NA  Most recent Thyroid results: 2.3     Diagnosis No active diagnosis   Course Stable   Plan Regular SSI every 6 hours  Goal 140-180       Hematologic:  Data/Assessment:  Reviewed    Diagnosis Anemia of chronic disease and Thrombocytopenia   Course Stable   Plan SCDs (Sequential Compression Device)  For DVT Prophylaxis  Sub q heparin  Continue to closely monitor cbc/coags      Infectious Diseases:  Data/Assessment Tmax last 24 hrs: No data recorded.       Diagnosis MRSA TV Endocarditis   Course Critical   Plan Antibiotic therapy  Agent:  Vancomycin  Indication: MRSA     repeat cultures; - follow up results  ID following; see above in Cardio    Will continue to monitor for additional s/s of infection   Will D/C Niagra for line holiday  New non tunneled HD catheter placed 8/22 in right IJ         Family Communication and Disposition:  Data/Assessment Patient updated with plan of care     I independently of the faculty provider spent a total of (37) minutes in direct/indirect care of this patient including initial evaluation, review  of laboratory, radiology, diagnostic studies, review of medical record, order entry and coordination of care.  Noberto Retort, APRN-ACNP-BC  02/11/2021, 15:38      Critical Care Attending Attestation  I was present at the bedside of this critically ill patient. I saw and examined the patient and discussed the patient with the CVICU team. I agree with the current note and plan.   This patient suffers from IVDA, drug overdose, aspiration PNA requiring intubation, MRSAPNA and MRSA IE of TV w/moderate TR &bacteremia. AKI requiring CRRT. UDOA +heroin &methamphetamines. S/p bronchoscopy 8/25. Pt self-extubated 8/25; doing well on RA. S/p OR (8/26) w/OMFS for dental extractions prior to open heart sx. C/w Abx as per ID recs.    MRI cervical/thoracic/lumbar completed and no signs of infection  of C/T/L spines.  Pt has no l/e deficits (neurologically intact).  Pt BP stable, nephro for iHD-->requested lasix challenge today.  Given lasix 28m IV x 1.  Will give diuril 5080mIV x 1 if no or minimal response w/lasix challenge.  +Malnutrition-->f/u nutrition consult.  Seroquel BID 2/2 agitation.    The data reviewed and care planning were performed in direct proximity of the patient. All time was spent exclusive of procedures which will be documented elsewhere in the chart. My time is independent and unique to other providers. Medications, allergies, vital signs, lab tests, imaging, nursing notes and physician notes have been reviewed.  [id number] NonCCM time in overall assessment, review, documentation, family discussions; excludes procedures.  AngJuan QuamD  HVI CCM
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
## 2021-02-11 NOTE — Pharmacy (Signed)
[facility] [place] / [facility]  Therapeutic Drug Monitoring: Vancomycin  02/11/2021      Patient name: [name] [name], [name] [name]  Date of Birth:  10-Apr-1986    Actual Weight:  Weight: 65.6 kg (144 lb 10 oz) (02/10/21 0600)     BMI:  BMI (Calculated): 24.12 (02/10/21 0600)      Date RPh Current regimen (including mg/kg) Indication &  Organism AUC or trough based dosing Target Levels^ SCr (mg/dL) CrCl* (mL/min) Infectious Laboratory Markers (as applicable)   Measured level(s)   (mcg/mL) Calculated AUC (if AUC based monitoring) Plan & predicted AUC/trough if initial dosing (including when levels are due) Comments   8/22 [facility] intermittent dosing at outside hospital with dialysis endocarditis trough 13-17 6.21 12 WBC:11.3  Procal:  CRP: Vanc random 24.4  No Vanc at this time as the level is 24.4 - will proceed with intermittent dosing- dialysis plan unclear at this time - will clarify in the morning to determine further dosing/levels    8/23 TWS Intermittent Dosing As above As above 13-17 CVVHD CVVHD    - Start vancomycin 1000 mg IV q24h while patient is on CRRT  - If CRRT is stopped, discontinue scheduled dose and dose by levels    8/25 TWS Vancomycin 1000 mg IV q24h As above As above 13-17 CVVHD CVVHD  16h level = 19.4  - Continue current dosing  - Obtain trough on 8/26    8/26 [name]  Vanc 1000 mg (15 mg/kg AdjBW) q24h Endocarditis  Trough  13-17 CRRT CRRT WBC: 24.8  16.9 (drawn approp)  -  - Continue current regimen   - Recommend checking trough again in 2-3 days or sooner if clinically indicated (e.g. changing CRRT --> iHD)    8/27 [name] Vancomycin 1000 mg IV q24h As above Trough 20-25 iHD iHD    28.9 pre-iHD level  - Will give supplemental dose after iHD of vancomycin 500 mg IV x 1, random with am labs tomorrow given unclear iHD schedule.     8/28 [name] Intermittent  Vancomycin 500 mg IV x 1 after iHD given on 8/27 As above Trough 20-25 iHD iHD  29.0 pre-iHD  - Defer dosing today. Plan for iHD again  tomorrow (8/29). Consider giving 500 mg or 250 mg with next iHD session.  - Repeat pre iHD level 8/30                       ^Target levels depends on dosing and monitoring method, AUC vs. trough based. For AUC based dosing units are mg*h/L. For trough based dosing units are mcg/mL.     *Creatinine clearance is estimated by using the Cockcroft-Gault equation for adult patients and the Carol Ada for pediatric patients.    The decision to discontinue vancomycin therapy will be determined by the primary service.  Please contact the pharmacist with any questions regarding this patient's medication regimen.

## 2021-02-12 ENCOUNTER — Other Ambulatory Visit: Payer: Self-pay

## 2021-02-12 ENCOUNTER — Inpatient Hospital Stay (HOSPITAL_COMMUNITY): Payer: Medicaid Other

## 2021-02-12 DIAGNOSIS — Z992 Dependence on renal dialysis: Secondary | ICD-10-CM

## 2021-02-12 DIAGNOSIS — Z6825 Body mass index (BMI) 25.0-25.9, adult: Secondary | ICD-10-CM

## 2021-02-12 DIAGNOSIS — R451 Restlessness and agitation: Secondary | ICD-10-CM

## 2021-02-12 LAB — CBC
HCT: 26.4 % — ABNORMAL LOW (ref 34.8–46.0)
HGB: 8.2 g/dL — ABNORMAL LOW (ref 11.5–16.0)
MCH: 24.6 pg — ABNORMAL LOW (ref 26.0–32.0)
MCHC: 31.1 g/dL (ref 31.0–35.5)
MCV: 79.3 fL (ref 78.0–100.0)
MPV: 9.9 fL (ref 8.7–12.5)
PLATELETS: 247 10*3/uL (ref 150–400)
RBC: 3.33 10*6/uL — ABNORMAL LOW (ref 3.85–5.22)
RDW-CV: 22.5 % — ABNORMAL HIGH (ref 11.5–15.5)
WBC: 14 10*3/uL — ABNORMAL HIGH (ref 3.7–11.0)

## 2021-02-12 LAB — BPAM PACKED CELL ORDER

## 2021-02-12 LAB — ADULT ROUTINE BLOOD CULTURE, SET OF 2 BOTTLES (BACTERIA AND YEAST): BLOOD CULTURE, ROUTINE: ABNORMAL — CR

## 2021-02-12 LAB — TYPE AND CROSS RED CELLS - UNITS
ABO/RH(D): O POS
ANTIBODY SCREEN: NEGATIVE
UNITS ORDERED: 5

## 2021-02-12 LAB — HEPATIC FUNCTION PANEL
ALBUMIN: 1.6 g/dL — ABNORMAL LOW (ref 3.5–5.0)
ALKALINE PHOSPHATASE: 112 U/L — ABNORMAL HIGH (ref 40–110)
ALT (SGPT): 14 U/L (ref 8–22)
AST (SGOT): 27 U/L (ref 8–45)
BILIRUBIN DIRECT: 0.4 mg/dL (ref 0.1–0.4)
BILIRUBIN TOTAL: 0.8 mg/dL (ref 0.3–1.3)
PROTEIN TOTAL: 7.4 g/dL (ref 6.4–8.3)

## 2021-02-12 LAB — BASIC METABOLIC PANEL
ANION GAP: 11 mmol/L (ref 4–13)
BUN/CREA RATIO: 11 (ref 6–22)
BUN: 44 mg/dL — ABNORMAL HIGH (ref 8–25)
CALCIUM: 7.6 mg/dL — ABNORMAL LOW (ref 8.5–10.0)
CHLORIDE: 100 mmol/L (ref 96–111)
CO2 TOTAL: 22 mmol/L (ref 22–30)
CREATININE: 4.1 mg/dL — ABNORMAL HIGH (ref 0.60–1.05)
ESTIMATED GFR: 14 mL/min/BSA — ABNORMAL LOW (ref >=60)
GLUCOSE: 97 mg/dL (ref 65–125)
POTASSIUM: 4.5 mmol/L (ref 3.5–5.1)
SODIUM: 133 mmol/L — ABNORMAL LOW (ref 136–145)

## 2021-02-12 LAB — C3 COMPLEMENT, SERUM: C3 COMPLEMENT: 12 mg/dL — ABNORMAL LOW (ref 81–157)

## 2021-02-12 LAB — C-REACTIVE PROTEIN(CRP),INFLAMMATION: CRP INFLAMMATION: 173.3 mg/L — ABNORMAL HIGH (ref <8.0)

## 2021-02-12 LAB — C4 COMPLEMENT, SERUM: C4 COMPLEMENT: 11 mg/dL — ABNORMAL LOW (ref 12–39)

## 2021-02-12 LAB — VANCOMYCIN, RANDOM: VANCOMYCIN RANDOM: 23.2 ug/mL — ABNORMAL LOW (ref 30.0–40.0)

## 2021-02-12 LAB — H & H
HCT: 27.6 % — ABNORMAL LOW (ref 34.8–46.0)
HGB: 9.1 g/dL — ABNORMAL LOW (ref 11.5–16.0)

## 2021-02-12 MED ORDER — SODIUM CHLORIDE 0.9 % (FLUSH) INJECTION SYRINGE
2.0000 mL | INJECTION | INTRAMUSCULAR | Status: DC | PRN
Start: 2021-02-12 — End: 2021-02-13

## 2021-02-12 MED ORDER — VITAMIN B COMPLEX AND VITAMIN C NO.20-FOLIC ACID 1 MG CAPSULE
1.0000 | ORAL_CAPSULE | Freq: Every day | ORAL | Status: DC
Start: 2021-02-12 — End: 2021-03-02
  Administered 2021-02-12 – 2021-03-02 (×19): 1 via ORAL
  Filled 2021-02-12 (×20): qty 1

## 2021-02-12 MED ORDER — OXYCODONE 5 MG TABLET
5.0000 mg | ORAL_TABLET | ORAL | Status: DC | PRN
Start: 2021-02-12 — End: 2021-02-12

## 2021-02-12 MED ORDER — DEXMEDETOMIDINE 100 MCG/ML INTRAVENOUS SOLUTION
0.2000 ug/kg/h | INTRAVENOUS | Status: DC
Start: 2021-02-12 — End: 2021-02-13
  Administered 2021-02-12: 0.8 ug/kg/h via INTRAVENOUS
  Administered 2021-02-12: 0.2 ug/kg/h via INTRAVENOUS
  Administered 2021-02-12: 0 ug/kg/h via INTRAVENOUS
  Administered 2021-02-12: 1 ug/kg/h via INTRAVENOUS
  Administered 2021-02-12 (×2): 0.2 ug/kg/h via INTRAVENOUS
  Administered 2021-02-12: 0.4 ug/kg/h via INTRAVENOUS
  Administered 2021-02-12: 0.6 ug/kg/h via INTRAVENOUS
  Administered 2021-02-12: 1.5 ug/kg/h via INTRAVENOUS
  Administered 2021-02-12: 0 ug/kg/h via INTRAVENOUS
  Administered 2021-02-12: 0.2 ug/kg/h via INTRAVENOUS
  Filled 2021-02-12 (×2): qty 4

## 2021-02-12 MED ORDER — OXYCODONE 5 MG TABLET
10.0000 mg | ORAL_TABLET | ORAL | Status: DC | PRN
Start: 2021-02-12 — End: 2021-02-12

## 2021-02-12 MED ORDER — POTASSIUM CHLORIDE 2 MEQ/ML INTRAVENOUS SOLUTION
INTRAVENOUS | Status: AC
Start: 2021-02-12 — End: 2021-02-12

## 2021-02-12 MED ORDER — OXYCODONE 10 MG/0.5 ML ORAL SYRINGE (FOR ORAL USE ONLY)
5.0000 mg | INJECTION | ORAL | Status: DC | PRN
Start: 2021-02-12 — End: 2021-02-14

## 2021-02-12 MED ORDER — VANCOMYCIN 10 GRAM INTRAVENOUS SOLUTION
500.0000 mg | Freq: Once | INTRAVENOUS | Status: AC
Start: 2021-02-12 — End: 2021-02-12
  Administered 2021-02-12 (×2): 0 mg via INTRAVENOUS
  Administered 2021-02-12: 500 mg via INTRAVENOUS
  Filled 2021-02-12: qty 5

## 2021-02-12 MED ORDER — OXYCODONE 10 MG/0.5 ML ORAL SYRINGE (FOR ORAL USE ONLY)
10.0000 mg | INJECTION | ORAL | Status: DC | PRN
Start: 2021-02-12 — End: 2021-02-14
  Administered 2021-02-12 – 2021-02-14 (×4): 10 mg via ORAL
  Filled 2021-02-12 (×4): qty 0.5

## 2021-02-12 MED ORDER — HYDROMORPHONE 1 MG/ML INJECTION WRAPPER
0.4000 mg | INJECTION | Freq: Once | INTRAMUSCULAR | Status: AC
Start: 2021-02-12 — End: 2021-02-12
  Administered 2021-02-12: 0.4 mg via INTRAVENOUS
  Filled 2021-02-12: qty 1

## 2021-02-12 MED ORDER — SODIUM CHLORIDE 0.9 % IV BOLUS
40.0000 mL | INJECTION | Freq: Once | Status: AC | PRN
Start: 2021-02-12 — End: 2021-02-12

## 2021-02-12 MED ORDER — SODIUM CHLORIDE 0.9 % (FLUSH) INJECTION SYRINGE
2.0000 mL | INJECTION | Freq: Three times a day (TID) | INTRAMUSCULAR | Status: DC
Start: 2021-02-12 — End: 2021-02-13
  Administered 2021-02-12 – 2021-02-13 (×3): 0 mL

## 2021-02-12 MED ORDER — HYDROMORPHONE 1 MG/ML INJECTION WRAPPER
0.2000 mg | INJECTION | INTRAMUSCULAR | Status: DC | PRN
Start: 2021-02-12 — End: 2021-02-13
  Administered 2021-02-12 – 2021-02-13 (×2): 0.2 mg via INTRAVENOUS
  Filled 2021-02-12 (×2): qty 1

## 2021-02-12 MED ORDER — QUETIAPINE 100 MG TABLET
100.0000 mg | ORAL_TABLET | Freq: Every evening | ORAL | Status: DC
Start: 2021-02-12 — End: 2021-02-16
  Administered 2021-02-12 – 2021-02-15 (×4): 100 mg via ORAL
  Filled 2021-02-12 (×4): qty 1

## 2021-02-12 MED ORDER — POLYETHYLENE GLYCOL 3350 17 GRAM ORAL POWDER PACKET
34.0000 g | Freq: Once | ORAL | Status: AC
Start: 2021-02-12 — End: 2021-02-12
  Administered 2021-02-12: 34 g via ORAL
  Filled 2021-02-12: qty 2

## 2021-02-12 MED ORDER — QUETIAPINE 50 MG TABLET
50.0000 mg | ORAL_TABLET | Freq: Every day | ORAL | Status: DC
Start: 2021-02-12 — End: 2021-02-14
  Administered 2021-02-12 – 2021-02-14 (×2): 50 mg via ORAL
  Filled 2021-02-12 (×2): qty 1

## 2021-02-12 NOTE — Care Plan (Signed)
[facility]  Speech Therapy Initial Swallow Evaluation     Patient Name: [name] [name]  Date of Birth: 02-Dec-1985  Height: Height: 165.1 cm (_0 )  Weight: Weight: 65.6 kg (144 lb 10 oz)  Room/Bed: 19/A  Payor: Alecia Lemming MEDICAID / Plan: [employer] [place] MEDICAID / Product Type: Medicaid [place] /      Assessment:  Functional Level At Time Of Eval: This patient is on a diet of clear liquids prior to procedure tomorrow. SLP presented trials of thin liquid after which the patient had a delayed cough response. Unable evaluate full swallow profile and will re-evaluate after patient has procedure. Unable to recommend a diet given limited assessment, recommend ice chips until re-evaluation.    Voice noted to be horse. Appreciated fresh sutures in gum line from extractions.     SLP Swallowing Diagnosis: mild dysphagia (suspected)  SLP Diet Recommendation:  (will reassess with full eval after surgery)  Aspiration Precautions:       Discharge Needs:  Discharge Disposition: to be determined     Plan:  To provide Speech Therapy services  (1-3x/week) until discharge.     The risks/benefits of therapy have been discussed with the patient/caregiver and he/she is in agreement with the established plan of care.         Subjective & Objective      02/12/21 1028   Therapist Pager   SLP Pager [address]   Rehab Session   Document Type evaluation   Total SLP Minutes: 14   Patient Effort adequate   Symptoms Noted During/After Treatment fatigue   General Information   Patient Profile Reviewed yes   Onset of Illness/Injury or Date of Surgery 02/05/21   Patient/Family/Caregiver Comments/Observations Patient's family at bedside endorded that she was independent and functional prior to the overdose, eating/drinking/communicating independently   General Observations of Patient patient sitting confused in bed, able to speak but required some repetition   Pertinent History of Current Functional Problem "[name] [name] is a 35  yo female with a significant PMH for Smoking and IVDU presented to [facility] with AMS on 01/31/21. 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 Urine drug screen + for heroin and methamphetamines   Existing Precautions/Restrictions full code   Limitations/Impairments safety/cognitive   Mutuality/Individual Preferences   Anxieties, Fears or Concerns none voiced at this time   Functional Status Prior   Eating 0 - independent   Communication 0 - understands/communicates without difficulty   Swallowing 0 - swallows foods/liquids without difficulty   Prior Functional Level Comment per family a bedside   Pain Assessment   Pre/Posttreatment Pain Comment no c/o pain at this time   Oral Motor Structure and Function    Additional Documentation Oral Motor Structure/Functional Assessment (Group)   Oral Motor Structure/Functional Assessment   Dentition (Oral Motor) edentulous, does not have dentures   Mucosal Quality (Oral Motor Assessment) good   Oral Musculature (Oral Motor Assessment) WFL   Secretion Management (Oral Motor Assessment) WFL   Velar Elevation (Oral Motor) WFL   Volitional Cough (Oral Motor Assessment) no issues initiating volitional cough   Volitional Swallow (Oral Motor Assessment) no issues initiating volitional swallow   Non Instrumental/Clinical Swallow (NIS)   Additional Documentation Thin Liquid Trial (NIS) (Group)   Thin Liquid Trial (NIS)   Comment,  Thin Liquid (NIS) 2 ounces thin liquids   Mode of Presentation, Thin Liquid (NIS) fed by clinician;self-fed;cup;spoon   Oral Phase Results, Thin Liquid (NIS) intact oral phase without signs of dysfunction   Pharyngeal Phase Results, Thin Liquid (NIS) impaired pharyngeal phase of swallowing  (delayed cough response)   Volume Presented in  mL, Thin Liquid (NIS) 5 mL;patient controlled volumes   Plan of Care Review   Plan Of Care Reviewed With patient;family   Swallowing Clinical Impression   SLP Swallowing Diagnosis mild dysphagia  (suspected)   Rehab Potential/Prognosis (Swallow Eval) good, to achieve stated therapy goals   Functional Level At Time Of Eval This patient is on a diet of clear liquids prior to procedure tomorrow. SLP presented trials of thin liquid after which the patient had a delayed cough response. Unable evaluate full swallow profile and will re-evaluate after patient has procedure. Unable to recommend a diet given limited assessment, recommend ice chips until re-evaluation.   Criteria for Skilled Merri Brunette Met demonstrates skilled criteria for intervention   Therapy Frequency   (1-3x/week)   Predicted Duration Therapy Interv (days) until discharge   SLP Diet Recommendation   (will reassess with full eval after surgery)        Therapist:  Clifton Custard, ST   Pager #: [phone]  Phone #: [phone]

## 2021-02-12 NOTE — Care Plan (Signed)
[facility]  Occupational Therapy Initial Evaluation    Patient Name: [name] [name]  Date of Birth: 10-14-1985  Height: Height: 165.1 cm (_0 )  Weight: Weight: 65.6 kg (144 lb 10 oz)  Room/Bed: 19/A  Payor: Alecia Lemming MEDICAID / Plan: [employer] [place] MEDICAID / Product Type: Medicaid [place] /     Assessment:   Patient tolerated OT evaluation fair this date. Patient presents with decreased balance, decreased endurance. Patient requires Min assist x1-2 for safe transfers and mobility at this time and CG assist during ADL tasks. Patient pending valve replacement tomorrow, will reassess following sx. Patient is expected to be able to return home with 24/7 assist.      Discharge Needs:   Equipment Recommendation: to be determined    Discharge Disposition: home with 24/7 assistance    Plan:   Current Intervention: ADL retraining, balance training, endurance training, strengthening, therapeutic exercise, transfer training    To provide Occupational therapy services 1x/day, minimum of 2x/week, until discharge.       The risks/benefits of therapy have been discussed with the patient/caregiver and he/she is in agreement with the established plan of care.       Subjective & Objective        02/12/21 1005   Therapist Pager   OT Assigned/ Pager # [phone]   Rehab Session   Document Type evaluation   Total OT Minutes: 17   Patient Effort adequate   Symptoms Noted During/After Treatment fatigue   General Information   Patient Profile Reviewed yes   Onset of Illness/Injury or Date of Surgery 02/05/21   Pertinent History of Current Functional Problem [name] is a 35 y.o. female with PMH significant for IVDU and current smoker, who was transferred for cardiac surgery evaluation for MRSA bacteremia and TV endocarditis, on 02/05/21.  She has medically optimized by our critical care team.  She has completed her preoperative workup including Dental extractions.  She will be taken to the operating room  tomorrow for tricuspid valve repair/repalacement   Medical Lines PIV Line;Telemetry   Respiratory Status room air   Existing Precautions/Restrictions full code;fall precautions   Pre Treatment Status   Pre Treatment Patient Status Patient sitting in bedside chair or w/c;Call light within reach;Telephone within reach;Sitter select activated   Support Present Pre Treatment  Family present;Sitter present;Nurse present   Communication Pre Treatment  Nurse   Mutuality/Individual Preferences   Individualized Care Needs OOB with Ax1-2   Living Environment   Lives With significant other   Living Arrangements house   Living Environment Comment patient has been living with significant other, however patient's mother at bedside states she will return home to [facility] with her.   Functional Level Prior   Ambulation 0 - independent   Transferring 0 - independent   Toileting 0 - independent   Bathing 0 - independent   Dressing 0 - independent   Eating 0 - independent   Vital Signs   Pre-Treatment Heart Rate (beats/min) 107   Post-treatment Heart Rate (beats/min) 115   Pre Treatment BP 105/73   Post Treatment BP 111/69   O2 Delivery Pre Treatment room air   O2 Delivery Post Treatment room air   Vitals Comment SpO2 sensor damaged, not reading. Notified RN who replaced it   Coping/Psychosocial   Observed Emotional State restless;cooperative;flat   Coping/Psychosocial Response Interventions   Plan Of Care Reviewed With patient;mother   Cognitive Assessment/Interventions   Behavior/Mood Observations alert;cooperative;flat  affect   Attention difficulty attending to task/directions;distractible;needs cues to re-direct   Follows Commands follows one step commands;repetition of directions required;verbal cues/prompting required   RUE Assessment   RUE Assessment X- Exceptions   RUE ROM AROM WFL   RUE Strength Grossly 3/5   LUE Assessment   LUE Assessment X-Exceptions   LUE ROM AROM WFL   LUE Strength Grossly 3/5   Mobility Assessment/Training    Mobility Comment Patient transitioned from STS with CG assist. Patient able to ambulate ~200' with Min assist x2 via side by side before requiring seated rest break. Following rest, patient able to ambulate ~200' back to room with Min assist x2 via side by side. Patient returned to sitting in bedside chair.   Transfer Assessment/Treatment   Sit-Stand Independence contact guard assist   Stand-Sit Independence contact guard assist   Bed-Chair Independence minimum assist (75% patient effort);2 person assist required;verbal cues required   Chair-Bed Independence minimum assist (75% patient effort);2 person assist required;verbal cues required   Bed-Chair-Bed Assist Device side by side   Transfer Impairments balance impaired;endurance;coordination impaired;postural control impaired;strength decreased   Toileting Assessment/Training   Position standing   TOILETING ASSESSED Perineal hygiene   Independence Level  contact guard assist   Comment CG assist required for balance   Balance Skill Training   Sitting Balance: Static good balance   Sitting, Dynamic (Balance) fair balance   Sit-to-Stand Balance fair balance   Standing Balance: Static fair balance   Standing Balance: Dynamic poor + balance   Systems Impairment Contributing to Balance Disturbance musculoskeletal   Post Treatment Status   Post Treatment Patient Status Patient sitting in bedside chair or w/c;Call light within reach;Telephone within reach;Sitter select activated   Support Present Post Treatment  Family present;Sitter present   Financial trader Nurse   Care Plan Goals   OT Rehab Goals Transfer Training Goal 2;Toileting Goal;LB Dressing Goal;UB Dressing Goal;Grooming Goal   Grooming Goal   Grooming Goal, Date Established 02/12/21   Grooming Goal, Time to Achieve by discharge   Grooming Goal, Activity Type all grooming tasks   Grooming Goal, Independence  independent   Grooming Goal, Position standing   LB Dressing Goal   LB Dressing Goal, Date  Established 02/12/21   LB Dressing Goal, Time to Achieve by discharge   LB Dressing Goal, Activity Type all lower body dressing tasks   LB Dressing Goal, Independence Level modified independence   Toileting Goal   Toileting Goal, Date Established 02/12/21   Toileting Goal, Time to Achieve by discharge   Toileting Goal, Activity Type all toileting tasks   Toileting Goal, Independence Level modified independence   Transfer Training Goal 2   Transfer Training Goal, Date Established 02/12/21   Transfer Training Goal, Time to Achieve by discharge   Transfer Training Goal, Activity Type toilet;sit-to-stand/stand-to-sit;bed-to-chair/chair-to-bed   Transfer Training Goal, Independence Level modified independence   UB Dressing Goal   UB Dressing  Goal, Date Established 02/12/21   UB Dressing Goal, Time to Achieve by discharge   UB Dressing Goal, Activity Type all upper body dressing tasks   UB Dressing Goal, Independence Level modified independence   Planned Therapy Interventions, OT Eval   Planned Therapy Interventions ADL retraining;balance training;endurance training;strengthening;therapeutic exercise;transfer training   Functional Impairment   Overall Functional Impairments/Problem List balance impaired;endurance;strength decreased;postural control impaired;coordination impaired   Clinical Impression   Functional Level at Time of Session Patient tolerated OT evaluation fair this date. Patient presents with decreased balance, decreased endurance.  Patient requires Min assist x1-2 for safe transfers and mobility at this time and CG assist during ADL tasks. Patient pending valve replacement tomorrow, will reassess following sx. Patient is expected to be able to return home with 24/7 assist.   Criteria for Skilled Therapeutic Interventions Met (OT) yes;skilled treatment is necessary   Rehab Potential good, to achieve stated therapy goals   Therapy Frequency 1x/day;minimum of 2x/week   Predicted Duration of Therapy until  discharge   Anticipated Equipment Needs at Discharge to be determined   Anticipated Discharge Disposition home with 24/7 assistance   Highest level of Mobility score   Exercise/Activity Level Performed 8- Walked 250 feet or more   Evaluation Complexity Justification   Occupational Profile Review Expanded review   Performance Deficits 3-5 deficits   Clinical Decision Making Moderate analytic complexity   Evaluation Complexity Moderate       Therapist:   Trellis Moment, OT   Pager #: [phone]

## 2021-02-12 NOTE — Consults (Signed)
INFECTIOUS DISEASE FOLLOW UP CONSULTATION    Patient Name: [name] [name] Number: [id number]  Date of Service: 02/12/2021  Date of Birth: 1986-04-30    Hospital Day:  LOS: 7 days     Reason for Consultation: MRSA native tricuspid valve endocarditis     Subjective: [name] [name] is seen sitting up in the chair today. She denies any complaints. Reports she has been tolerating antibiotics well, no rash or diarrhea. Denies any worsening back pain. Will have TV repair/replacemet tomorrow morning. Planing to receive HD today.     Objective:  Physical Exam:  Current Vitals: BP (!) 88/42   Pulse (!) 101   Temp (!) 38.2 C (100.8 F)   Resp (!) 28   Ht 1.651 m ('5\' 5"'$ )   Wt 65.6 kg (144 lb 10 oz)   SpO2 94%   BMI 24.07 kg/m       Vitals in last 24 hours: Temp  Avg: 37.9 C (100.3 F)  Min: 37.2 C (98.9 F)  Max: 38.4 C (101.2 F)  MAP (Non-Invasive)  Avg: 80.7 mmHG  Min: 56 mmHG  Max: 132 mmHG  Pulse  Avg: 111.8  Min: 100  Max: 133  Resp  Avg: 28  Min: 21  Max: 37  SpO2  Avg: 95.5 %  Min: 88 %  Max: 100 %  General: No acute distress.  Chronically-ill appearing. Sitting up the bedside chair.   Eyes: Pupils equal round and reactive bilaterally.  No conjunctival injection or scleral icterus.  ENT: Face symmetrical.  Mucous membranes pink and moist.  No oral candidiasis or mucosal ulcers. Edentulous.   Neck: Supple and symmetrical.   Lungs: Clear to auscultation bilaterally.  No wheezes, rhonchi, or crackles.  Unlabored respirations.  Cardiovascular: Regular rate and rhythm.  + murmur.   Abdomen: Normoactive bowel sounds. Distended, soft, and nontender to palpation.    Extremities: No joint erythema or swelling.  No cyanosis or edema.  Skin: Warm and dry.  No rash or lesions. Purpuric lesions to hands and feel  Neurologic: Alert but she is confused. Moves all extremities without focal deficit.  Psychiatric: Appropriate affect and behavior.   Fluent speech.     Inpatient Medications:  acetaminophen (TYLENOL) tablet, 650  mg, Oral, Q4H PRN  B complex-vitamin C-folic acid (NEPHRONEX) oral liquid, 5 mL, Oral, Daily  bisacodyl (DULCOLAX) rectal suppository, 10 mg, Rectal, Daily PRN  dexmedeTOMIDine (PRECEDEX) 400 mcg in NS 100 mL (tot vol) infusion, 0.2 mcg/kg/hr (Adjusted), Intravenous, Continuous  docusate sodium (COLACE) capsule, 100 mg, Oral, 2x/day  heparin 5,000 unit/mL injection, 5,000 Units, Subcutaneous, Q8HRS  HYDROmorphone (DILAUDID) 1 mg/mL injection, 0.2 mg, Intravenous, Q4H PRN  levalbuterol (XOPENEX) 1.25 mg/ 0.5 mL nebulizer solution, 1.25 mg, Nebulization, Q6H PRN   And  sodium chloride 3% nebulizer solution, 3 mL, Nebulization, Q6H PRN  miconazole nitrate (SECURA) 2% topical cream, , Apply Topically, 2x/day  NS flush syringe, 2-6 mL, Intracatheter, Q8HRS   And  NS flush syringe, 2-6 mL, Intracatheter, Q1 MIN PRN  NS flush syringe, 10-40 mL, Intravenous, Q8HRS  NS flush syringe, 20-30 mL, Intravenous, Q1 MIN PRN  ondansetron (ZOFRAN) 2 mg/mL injection, 4 mg, Intravenous, Q8H PRN  polyethylene glycol (MIRALAX) oral packet, 17 g, Oral, Daily  QUEtiapine (SEROQUEL) tablet, 50 mg, Oral, 2x/day  sodium citrate 4% (3 mL) injection syringe, 2 Syringe, Intracatheter, Q1H PRN  Vancomycin IV - Pharmacist to Dose per Protocol, , Does not apply, Daily PRN  Vancomycin IV Intermittent Dosing, , Does  not apply, Daily PRN        Current Antimicrobials:  Antibiotics (From admission, onward)      Start     Stop Route Frequency    02/10/21 1900  vancomycin (VANCOCIN) 500 mg in NS 100 mL IVPB         08/27 2353 IV ONCE    02/10/21 1016  Vancomycin IV Intermittent Dosing         -- N/A DAILY PRN    02/06/21 2000  vancomycin (VANCOCIN) 1 g in D5W 200 mL premix IVPB  Status:  Discontinued         08/27 1016 IV EVERY 24 HOURS    02/05/21 0226  Vancomycin IV Intermittent Dosing  Status:  Discontinued         08/23 1608 N/A DAILY PRN             Lines:  Patient Lines/Drains/Airways Status       Active Line / Dialysis Catheter / Dialysis Graft /  Drain / Airway / Wound       Name Placement date Placement time Site Days    Peripheral IV Ultrasound guided;Extended dwell catheter Right Cephalic  (lateral side of arm);Forearm 02/12/21  0201  -- less than 1    Peripheral IV Ultrasound guided;Extended dwell catheter Anterior;Proximal;Right;Upper Arm 02/12/21  0204  -- less than 1    Wound (Non-Surgical) Anterior;Right Elbow 02/05/21  0045  -- 7    Wound (Non-Surgical) Anterior;Right Finger 1;2;3;4;5 02/10/21  2000  -- 1    Wound (Non-Surgical) Anterior;Left Finger 1;2;3;4;5 02/10/21  2000  -- 1    Wound (Non-Surgical) Buttock 02/11/21  2000  -- less than 1    Surgical Incision Inner Mouth 02/09/21  --  -- 3                     Laboratory Studies:  CBC Differential   Recent Labs     02/10/21  0248 02/11/21  0019 02/12/21  0158   WBC 20.4* 15.8* 14.0*   HGB 7.2* 7.8* 8.2*   HCT 22.7* 24.5* 26.4*   PLTCNT 214 203 247    Recent Labs     02/10/21  0248 02/11/21  0019   PMNS 94 83   LYMPHOCYTES 2  --    MONOCYTES 4 8   EOSINOPHIL 0  --    BASOPHILS 0  <0.10 0  <0.10   PMNABS 19.18* 13.18*   LYMPHSABS  --  0.91*   MONOSABS 0.82 1.31*   EOSABS <0.10 <0.10      BMP LFTs   Recent Labs     02/12/21  0158   SODIUM 133*   POTASSIUM 4.5   CHLORIDE 100   CO2 22   BUN 44*   CREATININE 4.10*   GLUCOSENF 97   ANIONGAP 11   BUNCRRATIO 11   GFR 14*   CALCIUM 7.6*    No results found for this encounter   CoAgs Blood Gas:   No results found for this encounter No results found for this encounter    Cardiac Markers Lipid Panel   No results for input(s): TROPONINI, CKMB, MBINDEX, BNP in the last 72 hours. No results found for this encounter   Urine Analysis Other Labs   No results found for this encounter Recent Labs     02/12/21  0158   CREAPROINFLA 173.3*        Microbiology:  Hospital Encounter on 02/05/21 (from the past  96 hour(s))   ADULT ROUTINE BLOOD CULTURE, SET OF 2 ADULT BOTTLES (BACTERIA AND YEAST)    Collection Time: 02/09/21  1:25 AM    Specimen: Blood   Culture Result  Status    BLOOD CULTURE, ROUTINE Abnormal Stain (AA) Final    BLOOD CULTURE, ROUTINE Methicillin Resistant Staphylococcus aureus (A) Final    GRAM STAIN Gram Positive Cocci/Clusters (A) Final       Susceptibility    Methicillin Resistant Staphylococcus aureus - MIC SUSCEPTIBILITY     Oxacillin >=4 Resistant mcg/mL     Gentamicin* <=0.5 Sensitive mcg/mL      * The role of gentamicin for the treatment of MSSA and MRSA infections is in combination with a cell-wall acting agent in the setting of prosthetic valve endocarditis.     Erythromycin >=8 Resistant mcg/mL     Clindamycin 0.25 Sensitive mcg/mL     Daptomycin 0.5 Sensitive mcg/mL     Tetracycline <=1 Sensitive mcg/mL     Doxycycline <=0.5 Sensitive mcg/mL     Linezolid 2 Sensitive mcg/mL     Vancomycin 1 Sensitive mcg/mL     Rifampin* <=0.5 Sensitive mcg/mL      * Rifampin should not be used alone for the treatment of Staphylococcus due to the quick emergence of resistance. For use in combination with other active agents for prosthetic material biofilm penetration.      Trimethoprim/Sulfamethoxazole <=10 Sensitive mcg/mL   ADULT ROUTINE BLOOD CULTURE, SET OF 2 ADULT BOTTLES (BACTERIA AND YEAST)    Collection Time: 02/09/21  1:37 AM    Specimen: Blood   Culture Result Status    BLOOD CULTURE, ROUTINE Abnormal Stain (AA) Final    BLOOD CULTURE, ROUTINE Methicillin Resistant Staphylococcus aureus (A) Final    GRAM STAIN Gram Positive Cocci/Clusters (A) Final   ADULT ROUTINE BLOOD CULTURE, SET OF 2 ADULT BOTTLES (BACTERIA AND YEAST)    Collection Time: 02/11/21  4:40 AM    Specimen: Blood   Culture Result Status    BLOOD CULTURE, ROUTINE Abnormal Stain (AA) Preliminary    GRAM STAIN Gram Positive Cocci/Clusters (A) Preliminary   ADULT ROUTINE BLOOD CULTURE, SET OF 2 ADULT BOTTLES (BACTERIA AND YEAST)    Collection Time: 02/11/21  4:40 AM    Specimen: Blood   Culture Result Status    BLOOD CULTURE, ROUTINE Abnormal Stain (AA) Preliminary    GRAM STAIN Gram Positive  Cocci/Clusters (A) Preliminary       No results found for any visits on 02/05/21 (from the past 24 hour(s)).    Imaging Studies:  Results for orders placed or performed during the hospital encounter of 02/05/21 (from the past 72 hour(s))   XR AP MOBILE CHEST     Status: None    Narrative    [place]  Female, 35 years old.    XR AP MOBILE CHEST performed on 02/10/2021 6:39 AM.    REASON FOR EXAM:  Tricuspid valve endocarditis    TECHNIQUE: 1 views/1 images submitted for interpretation.    COMPARISON:  Chest radiograph dated 02/09/2021.    FINDINGS:      Support devices: A right IJ central line is stable in position with the   tip projecting over the mid to lower right atrium.    The heart is at the upper limits of normal. There are bilateral hazy   opacities of the lung, with a more focal consolidative area over the right   midlung. There is also a cavitary appearance within the  right upper and   lower lung zones.      Impression    Stable lung aeration with cavitary appearance as over the right upper and   lower lung zones, suspect septic emboli.     MRI SPINE CERVICAL WO CONTRAST     Status: None    Narrative    Joslyne Hengst  Female, 35 years old.    MRI SPINE CERVICAL WO CONTRAST, MRI SPINE LUMBOSACRAL WO CONTRAST, MRI SPINE THORACIC WO CONTRAST performed on 02/10/2021 9:43 AM.    REASON FOR EXAM:  bacteremia      TECHNIQUE: MRI of the cervical, thoracic and lumbar spine were performed without contrast. 3 plane localizer, coronal STIR, sagittal T2, sagittal T1, axial T2, axial T1 and cervical GRE sequences were obtained.    COMPARISON: CT chest abdomen and pelvis 02/03/2021.    FINDINGS:    Study is degraded by motion artifact. Evaluation for discitis-osteomyelitis and fluid collections are compromised by the lack of IV contrast administration.    There is diffusely low vertebral body marrow signal, which is within normal limits for the patient's age. There is no intradiscal fluid collection, endplate marrow edema  or bony erosion to suggest discitis-osteomyelitis in the cervical, thoracic and lumbar spine.    The alignment is preserved. Vertebral body heights and disc spaces are normal. Posterior elements are intact. The cord and cauda equina are normal in caliber and signal. Incidental low-lying conus at L3-L4. There is no fibrofatty lesion of the filum terminale. No dysraphism is identified in the imaged lumbar and sacral spine. Within the confines of motion artifact, there is no convincing dorsal dermal sinus tract. No myelomeningocele is seen. No fluid collections are seen. The cervical, thoracic and lumbar spinal canal and neural foramina are normal in caliber.    Prevertebral soft tissues are normal. Multiple peripheral nodular airspace opacities consistent with septic-embolic disease are better appreciated on the prior CT chest from 02/05/2021.      Impression    1.Within the confines of an unenhanced study, there is no evidence of discitis-osteomyelitis or fluid collections in the cervical, thoracic and lumbar spine.  2.No cord signal abnormality or cord compression.  3.No spinal canal or neural foraminal stenosis.  4.Incidental low-lying conus at L3-L4. No associated fibrofatty lesion of the filum terminale, dysraphism of the imaged spine or myelomeningocele. No convincing dorsal dermal sinus tract.  5.Pulmonary septic-embolic disease is better assessed on the prior CT chest from 02/05/2021.   MRI SPINE THORACIC WO CONTRAST     Status: None    Narrative    Raymonde Marucci  Female, 35 years old.    MRI SPINE CERVICAL WO CONTRAST, MRI SPINE LUMBOSACRAL WO CONTRAST, MRI SPINE THORACIC WO CONTRAST performed on 02/10/2021 9:43 AM.    REASON FOR EXAM:  bacteremia      TECHNIQUE: MRI of the cervical, thoracic and lumbar spine were performed without contrast. 3 plane localizer, coronal STIR, sagittal T2, sagittal T1, axial T2, axial T1 and cervical GRE sequences were obtained.    COMPARISON: CT chest abdomen and pelvis  02/03/2021.    FINDINGS:    Study is degraded by motion artifact. Evaluation for discitis-osteomyelitis and fluid collections are compromised by the lack of IV contrast administration.    There is diffusely low vertebral body marrow signal, which is within normal limits for the patient's age. There is no intradiscal fluid collection, endplate marrow edema or bony erosion to suggest discitis-osteomyelitis in the cervical, thoracic and lumbar spine.  The alignment is preserved. Vertebral body heights and disc spaces are normal. Posterior elements are intact. The cord and cauda equina are normal in caliber and signal. Incidental low-lying conus at L3-L4. There is no fibrofatty lesion of the filum terminale. No dysraphism is identified in the imaged lumbar and sacral spine. Within the confines of motion artifact, there is no convincing dorsal dermal sinus tract. No myelomeningocele is seen. No fluid collections are seen. The cervical, thoracic and lumbar spinal canal and neural foramina are normal in caliber.    Prevertebral soft tissues are normal. Multiple peripheral nodular airspace opacities consistent with septic-embolic disease are better appreciated on the prior CT chest from 02/05/2021.      Impression    1.Within the confines of an unenhanced study, there is no evidence of discitis-osteomyelitis or fluid collections in the cervical, thoracic and lumbar spine.  2.No cord signal abnormality or cord compression.  3.No spinal canal or neural foraminal stenosis.  4.Incidental low-lying conus at L3-L4. No associated fibrofatty lesion of the filum terminale, dysraphism of the imaged spine or myelomeningocele. No convincing dorsal dermal sinus tract.  5.Pulmonary septic-embolic disease is better assessed on the prior CT chest from 02/05/2021.   MRI SPINE LUMBOSACRAL WO CONTRAST     Status: None    Narrative    Masey Damman  Female, 35 years old.    MRI SPINE CERVICAL WO CONTRAST, MRI SPINE LUMBOSACRAL WO CONTRAST, MRI  SPINE THORACIC WO CONTRAST performed on 02/10/2021 9:43 AM.    REASON FOR EXAM:  bacteremia      TECHNIQUE: MRI of the cervical, thoracic and lumbar spine were performed without contrast. 3 plane localizer, coronal STIR, sagittal T2, sagittal T1, axial T2, axial T1 and cervical GRE sequences were obtained.    COMPARISON: CT chest abdomen and pelvis 02/03/2021.    FINDINGS:    Study is degraded by motion artifact. Evaluation for discitis-osteomyelitis and fluid collections are compromised by the lack of IV contrast administration.    There is diffusely low vertebral body marrow signal, which is within normal limits for the patient's age. There is no intradiscal fluid collection, endplate marrow edema or bony erosion to suggest discitis-osteomyelitis in the cervical, thoracic and lumbar spine.    The alignment is preserved. Vertebral body heights and disc spaces are normal. Posterior elements are intact. The cord and cauda equina are normal in caliber and signal. Incidental low-lying conus at L3-L4. There is no fibrofatty lesion of the filum terminale. No dysraphism is identified in the imaged lumbar and sacral spine. Within the confines of motion artifact, there is no convincing dorsal dermal sinus tract. No myelomeningocele is seen. No fluid collections are seen. The cervical, thoracic and lumbar spinal canal and neural foramina are normal in caliber.    Prevertebral soft tissues are normal. Multiple peripheral nodular airspace opacities consistent with septic-embolic disease are better appreciated on the prior CT chest from 02/05/2021.      Impression    1.Within the confines of an unenhanced study, there is no evidence of discitis-osteomyelitis or fluid collections in the cervical, thoracic and lumbar spine.  2.No cord signal abnormality or cord compression.  3.No spinal canal or neural foraminal stenosis.  4.Incidental low-lying conus at L3-L4. No associated fibrofatty lesion of the filum terminale, dysraphism of  the imaged spine or myelomeningocele. No convincing dorsal dermal sinus tract.  5.Pulmonary septic-embolic disease is better assessed on the prior CT chest from 02/05/2021.   XR AP MOBILE CHEST  Status: None    Narrative    Vaidehi Gaydos  Female, 35 years old.    XR AP MOBILE CHEST performed on 02/11/2021 6:02 AM.    REASON FOR EXAM:  Tricuspid valve endocarditis    TECHNIQUE: 1 views/1 images submitted for interpretation.    COMPARISON:  Chest radiograph dated 02/10/2021.    FINDINGS:      Support devices: A right IJ central line is stable in position.    The heart is at the upper limits of normal for size. Bilateral patchy and cavitary opacities of the lungs are unchanged. There is no pleural effusion or pneumothorax.      Impression    Stable lung aeration with bilateral patchy and cavitary opacities.            Assessment:    This is a 35 y.o. female with a history of tobacco use, cleared hepatitis-C infection, nephrolithiasis and active injection drug use who was transferred to [facility] from [facility] on February 05, 2021 with MRSA native tricuspid valve endocarditis.  She presented to [facility] on January 31, 2021 with altered mental status, and was found to be in renal failure.  She was intubated and started on hemodialysis.  Blood and sputum cultures that grew MRSA.  On transfer to [place], a transthoracic echocardiogram confirmed a tricuspid valve vegetation.  CT of the chest shows bilateral airspace opacities some of which are cavitary likely representing septic emboli.  Blood cultures have remained persistently positive for MRSA.  She is now on intermittent HD.  MRI's of the spine without contrast showed no evidence of discitis or epidural abscess. CT surgery planning for tricuspid valve repair/replacement tomorrow.      Recommendations:    - Continue pharmacy to dose IV vancomycin per protocol   - Please obtain intraoperative cultures gram stain, bacterial culture, and fungal  stain and culture.  - Agree with repeated blood cultures every 48 hours. Please repeat following surgical intervention.   - Will likely require 6 weeks of IV/PO antibiotics.   - Please continue to follow CBC with differential, BUN, creatinine, hepatic function panel, and CRP while the patient remains on antimicrobial therapy.    We will continue following at this time.  Please call or page the Infectious Diseases Service with any questions regarding this patient.    I independently of the faculty provider spent a total of (25) minutes in direct/indirect care of this patient including initial evaluation, review of laboratory, radiology, diagnostic studies, review of medical record, order entry and coordination of care.      Rennis Chris, APRN, NP-C  Infectious Diseases     I personally saw and evaluated the patient as part of a shared service with an APP.      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  Complicated by AKI requiring renal replacement therapy.    I independently of the APP spent a total of 15 minutes in direct/indirect care of this patient including initial evaluation, review of laboratory, radiology, diagnostic studies, review of medical record, order entry and coordination of care.     Nury Nebergall R. Gloriajean Dell MD  Pager [phone]

## 2021-02-12 NOTE — Pharmacy (Signed)
[place] / Department of Pharmaceutical Services  Therapeutic Drug Monitoring: Vancomycin  [name]/2022      Patient name: [name] [name], [name] [name]  Date of Birth:  08-Apr-1986    Actual Weight:  Weight: 65.6 kg (144 lb 10 oz) (02/10/21 0600)     BMI:  BMI (Calculated): 24.12 (02/10/21 0600)      Date RPh Current regimen (including mg/kg) Indication &  Organism AUC or trough based dosing Target Levels^ SCr (mg/dL) CrCl* (mL/min) Infectious Laboratory Markers (as applicable)   Measured level(s)   (mcg/mL) Calculated AUC (if AUC based monitoring) Plan & predicted AUC/trough if initial dosing (including when levels are due) Comments   [name] [name] intermittent dosing at outside hospital with dialysis endocarditis trough 13-17 6.21 12 WBC:11.3  [place]cal:  CRP: Vanc random 24.4  No Vanc at this time as the level is 24.4 - will proceed with intermittent dosing- dialysis plan unclear at this time - will clarify in the morning to determine further dosing/levels    [name] [name] Intermittent Dosing As above As above 13-17 CVVHD CVVHD    - Start vancomycin 1000 mg IV q24h while patient is on CRRT  - If CRRT is stopped, discontinue scheduled dose and dose by levels    8/25 [name] [name] Vancomycin 1000 mg IV q24h As above As above 13-17 CVVHD CVVHD  16h level = 19.4  - Continue current dosing  - Obtain trough on [name]    [name] [name]  Vanc 1000 mg (15 mg/kg AdjBW) q24h Endocarditis  Trough  13-17 CRRT CRRT WBC: 24.8  16.9 (drawn approp)  -  - Continue current regimen   - Recommend checking trough again in 2-3 days or sooner if clinically indicated (e.g. changing CRRT --> iHD)    [name] [name] Vancomycin 1000 mg IV q24h As above Trough 20-25 iHD iHD    28.9 pre-iHD level  - Will give supplemental dose after iHD of vancomycin 500 mg IV x 1, random with am labs tomorrow given unclear iHD schedule.     [name] [name] Intermittent  Vancomycin 500 mg IV x 1 after iHD given on [name] As above Trough 20-25 iHD iHD  29.0 pre-iHD  - Defer dosing today. Plan for iHD again  tomorrow ([name]). Consider giving 500 mg or 250 mg with next iHD session.  - Repeat pre iHD level 8/30    [name] [name] Intermittent dosing As above Trough 20-25 pre-HD iHD iHD  23.2 pre-HD  - Give 500 mg x1 post-HD  - Follow plans for iHD and give 500 mg post-HD        ^Target levels depends on dosing and monitoring method, AUC vs. trough based. For AUC based dosing units are mg*h/L. For trough based dosing units are mcg/mL.     *Creatinine clearance is estimated by using the Cockcroft-Gault equation for adult patients and the Carol Ada for pediatric patients.    The decision to discontinue vancomycin therapy will be determined by the primary service.  Please contact the pharmacist with any questions regarding this patient's medication regimen.

## 2021-02-12 NOTE — Care Management Notes (Signed)
[facility]  Care Management Note    Patient Name: Legna Laskowski  Date of Birth: 13-Jun-1986  Sex: female  Date/Time of Admission: 02/05/2021 12:34 AM  Room/Bed: 19/A  Payor: [place] MEDICAID / Plan: [employer] MEDICAID / Product Type: Medicaid [facility] /    LOS: 7 days   Primary Care Providers:  Pcp, No (General)    Admitting Diagnosis:  Endocarditis [I38]    Assessment:      02/12/21 1312   Assessment Detail   Assessment Type Continued Assessment   Date of Care Management Update 02/12/21   Date of Next DCP Update 02/15/21   Social Work Plan   Discharge Planning Status plan in progress   Projected Discharge Date 02/16/21   CM will evaluate for rehabilitation potential yes   Discharge Needs Assessment   Discharge Facility/Level of Care Needs Home vs Home with Home Health   Transportation Available car;family or friend will provide       Discharge Plan:  Home vs home with Home Health  Per chart review, Cardiac Surgery following; planning for OR tomorrow (8/30) for tricuspid valve repair/replacement. Pt is currently on room air; continuing pulmonary toilet. ID following; pt will likely require 6-week course of abx. Nephrology following; HD treatment today. Speech Therapy and Dietitian following; pt is currently on a clear liquid diet. PT/OT following. D/C date and disposition TBD, pending pt's treatment plan and pt's progress. MSW Jearld Shines will continue to follow.     The patient will continue to be evaluated for developing discharge needs.     Case Manager: Elease Hashimoto, [place]  Phone: [phone]

## 2021-02-12 NOTE — Consults (Signed)
[facility]  Cardiac Surgery   Floor/Stepdown Progress Note      Theodocia, Drinkard  Date of Admission:  02/05/2021  Date of Birth:  [birth year]    Hospital Day:  LOS: 7 days   Date of Service:  02/12/2021    [name] is a 35 y.o. female with PMH significant for IVDU and current smoker, who was transferred for cardiac surgery evaluation for MRSA bacteremia and TV endocarditis, on 02/05/21.  She has medically optimized by our critical care team.  She has completed her preoperative workup including Dental extractions.  She will be taken to the operating room tomorrow for tricuspid valve repair/repalacement.  Her surgical consent has been obtained and preoperative orders placed.        Lorra Hals Jenille Laszlo, PA-C 02/12/2021 09:33

## 2021-02-12 NOTE — Care Plan (Signed)
[facility]  Physical Therapy Initial Evaluation    Patient Name: [name] [name]  Date of Birth: 1985-11-17  Height: Height: 165.1 cm ('5\' 5"'$ )  Weight: Weight: 65.6 kg (144 lb 10 oz)  Room/Bed: 19/A  Payor: [place] MEDICAID / Plan: [employer] [place] MEDICAID / Product Type: Medicaid [place] /     Assessment:      [name] [name] presents with deficits in balance, endurance, ambulation and transfers.  Her cognitive state at this time is impaired and she is very impulsive.  given her MDRO and need for valvular surgery, she will most likely be here for extended antibiotics to address her infections.  Expect her to be fully at baseline by the time her antibiotic infusions are completed.    Discharge Needs:    Equipment Recommendation: none anticipated  Discharge Disposition: home    Plan:   Current Intervention: balance training, bed mobility training, gait training, patient/family education, strengthening, transfer training  To provide physical therapy services minimum of 2x/week  for duration of until discharge.  The risks/benefits of therapy have been discussed with the patient/caregiver and he/she is in agreement with the established plan of care.       Subjective & Objective        02/12/21 1003   Therapist Pager   PT Assigned/ Pager # [phone]   Rehab Session   Document Type evaluation   Total PT Minutes: 17   Patient Effort adequate   Symptoms Noted During/After Treatment fatigue   General Information   Patient Profile Reviewed yes   Onset of Illness/Injury or Date of Surgery 02/05/21   Patient/Family/Caregiver Comments/Observations pt's mother at bedside, states that she was previously indpendent and functional prior to her overdose   Pertinent History of Current Functional Problem Patient is POD 3 Aortic root abscess debridement with irrigation  Root reconstruction with autologous pericardium  Aortic root enlargement (Nicks technique) with Bovine pericardial patch  AVR (23 Avalus)  Aortotomy  closure with Bovine pericardial patch  Pericardial and bilateral pleural effusion evacuation   Medical Lines PIV Line;Telemetry   Respiratory Status room air   Existing Precautions/Restrictions fall precautions;full code   General Observations: pt appears much older than stated age, somewhat impulsive and impatient   Mutuality/Individual Preferences   Individualized Care Needs 1:1 sitter required, pt needs assist x 1-2 for balance when up to bathroom or to ambulate   Patient-Specific Goals (Include Timeframe) pt will be able to ambulate without AD and with no LOB post surgery   Plan of Care Reviewed With patient;mother   Living Environment   Lives With significant other   Living Arrangements house   Home Assessment: No Problems Identified   Home Accessibility no concerns   Financial Concerns none   Transportation Available car;family or friend will provide   Living Environment Comment pt will be going to live with mother in [place] post surgery   Functional Level Prior   Ambulation 0 - independent   Transferring 0 - independent   Toileting 0 - independent   Bathing 0 - independent   Dressing 0 - independent   Eating 0 - independent   Communication 0 - understands/communicates without difficulty   Prior Functional Level Comment was fully independent PTA   Self-Care   Dominant Hand right   Usual Activity Tolerance good   Current Activity Tolerance moderate   Regular Exercise no   Equipment Currently Used at Home no   Equipment Currently Used at [employer]  none   Pre Treatment Status   Pre Treatment Patient Status Patient sitting in bedside chair or w/c;Call light within reach;Telephone within reach;Sitter select activated;Nurse approved session   Support Present Medical laboratory scientific officer present;Family present   Communication Pre Treatment  Nurse   Cognitive Assessment/Interventions   Behavior/Mood Observations flat affect;cooperative;alert   Attention difficulty attending to task/directions;distractible   Follows Commands follows  one step commands;verbal cues/prompting required   Vital Signs   Pre-Treatment Heart Rate (beats/min) 106   Post-treatment Heart Rate (beats/min) 105   Pre-Treatment Resp Rate (breaths/min) 20   Post-treatment Resp Rate (breaths/min) 24   Pre Treatment BP 105/73 (84)   Post Treatment BP 111/69 (81)   O2 Delivery Pre Treatment room air   O2 Delivery Post Treatment room air   Vitals Comment SpO2 sensor removed by patient   Pain Assessment   Pain Intervention  Declines   Pretreatment Pain Rating 0/10 - no pain   Posttreatment Pain Rating 0/10 - no pain   RLE Assessment   RLE Assessment X-Exceptions   RLE ROM WFL   RLE Strength generalized weakness grossly 4/5   LLE Assessment   LLE Assessment X-Exceptions   LLE ROM WFL   LLE Strength generalized weakness grossly 4/5   Transfer Assessment/Treatment   Sit-Stand Independence contact guard assist   Stand-Sit Independence contact guard assist   Sit-Stand-Sit, Assist Device side by side   Transfer Safety Issues balance decreased during turns;weight-shifting ability decreased   Transfer Impairments balance impaired;coordination impaired;endurance;strength decreased   Gait Assessment/Treatment   Total Distance Ambulated 400   Independence  contact guard assist   Assistive Device  side by side   Distance in Feet 200 x 2 with seated break   Deviations  scissor gait;step length decreased;toe-to-floor clearance decreased;weight-shifting ability decreased   Safety Issues  balance decreased during turns;sequencing ability decreased;step length decreased;weight-shifting ability decreased   Impairments  balance impaired;cognition impaired;coordination impaired;endurance;flexibility decreased;motor control impaired;strength decreased   Comment pt very impulsive, gait very poorly coordinated, midline crossover multiple times during ambulation.   Balance Skill Training   Comment with side by side assistance   Sitting Balance: Static good balance   Sitting, Dynamic (Balance) fair balance    Sit-to-Stand Balance fair balance   Standing Balance: Static fair balance   Standing Balance: Dynamic poor + balance   Systems Impairment Contributing to Balance Disturbance musculoskeletal   Identified Impairments Contributing to Balance Disturbance impaired coordination;impaired motor control;decreased strength   Therapeutic Exercise/Activity   Comment Pt was very impulsive and wanted to walk in the hallway.  Required assist x 2 for balance, poor gait quality.  Pt HR max at 120's, fatigued quickly.  Returned to room and sat in chair   Post Treatment Status   Post Treatment Patient Status Patient sitting in bedside chair or w/c;Call light within reach;Telephone within reach;Sitter select activated   Engineer, building services present;Family present   Financial trader Nurse   Communication Post Treatment Comment aware of pt performance   Plan of Care Review   Plan Of Care Reviewed With patient;mother   Basic Mobility Am-PAC/6Clicks Score (APPROVED PT Staff, [facility] PT/OT and [facility] Nursing ONLY)   Turning in bed without bedrails 4   Lying on back to sitting on edge of flat bed 4   Moving to and from a bed to a chair 4   Standing up from chair 4   Walk in room 3   Climbing 3-5 steps with railing  3   6 Clicks Raw Score total 22   Standardized (t-scale) score 47.4   CMS 0-100% Score 25.02   CMS Modifier CJ   Patient Mobility Goal (JHHLM) 7- Walk 25 feet or more 3X/day   Exercise/Activity Level Performed 8- Walked 250 feet or more   Functional Impairment   Overall Functional Impairments/Problem List balance impaired;cognition impaired;coordination impaired;endurance;motor control impaired;strength decreased   Physical Therapy Clinical Impression   Assessment [name] [name] presents with deficits in balance, endurance, ambulation and transfers.  Her cognitive state at this time is impaired and she is very impulsive.  given her MDRO and need for valvular surgery, she will most likely be here for extended  antibiotics to address her infections.  Expect her to be fully at baseline by the time her antibiotic infusions are completed.   Patient/Family Goals Statement I want to get up and walk   Criteria for Skilled Therapeutic yes;meets criteria;skilled treatment is necessary   Pathology/Pathophysiology Noted musculoskeletal;cardiovascular   Impairments Found (describe specific impairments) aerobic capacity/endurance;arousal, attention, and cognition;circulation;gait, locomotion, and balance;motor function   Functional Limitations in Following  self-care;home management;work;community/leisure   Disability: Inability to Perform work;community/leisure   Rehab Potential good, to achieve stated therapy goals   Therapy Frequency minimum of 2x/week   Predicted Duration of Therapy Intervention (days/wks) until discharge   Anticipated Equipment Needs at Discharge (PT) none anticipated   Anticipated Discharge Disposition home   Evaluation Complexity Justification   Patient History: Co-morbidity/factors that impact Plan of Care 3 or more that impact Plan of Care;CHF: decreased cardioplumonary performance impacting function;One or more other medical co-morbidity;Complicated prior level of function/decline in function;Social support barriers/factors   Examination Components 4 or more Exam elements addressed;Vital signs (BP, HR, RR, or O2 saturation);Range of motion;Strength;Balance;Transfers;Ambulation   Presentation Evolving: Symptoms, complaints, characteristics of condition changing &/or cognitive deficits present   Clinical Decision Making Moderate complexity   Evaluation Complexity Moderate complexity   Care Plan Goals   PT Rehab Goals Bed Mobility Goal;Gait Training Goal;Transfer Training Goal   Bed Mobility Goal   Bed Mobility Goal, Date Established 02/12/21   Bed Mobility Goal, Time to Achieve by discharge   Bed Mobility Goal, Activity Type all bed mobility activities   Bed Mobility Goal, Independence Level independent   Gait  Training  Goal, Distance to Achieve   Gait Training  Goal, Date Established 02/12/21   Gait Training  Goal, Time to Achieve by discharge   Gait Training  Goal, Independence Level independent   Gait Training  Goal, Assist Device least restricted assistive device   Gait Training  Goal, Distance to Achieve 850   Transfer Training Goal   Transfer Training Goal, Date Established 02/12/21   Transfer Training Goal, Time to Achieve by discharge   Transfer Training Goal, Activity Type all transfers   Transfer Training Goal, Independence Level independent   Transfer Training Goal, Assist Device least restrictrictive assistive device   Planned Therapy Interventions, PT Eval   Planned Therapy Interventions (PT) balance training;bed mobility training;gait training;patient/family education;strengthening;transfer training       Therapist:   Gypsy Lore, PT   Pager #: [phone]

## 2021-02-12 NOTE — Care Plan (Signed)
Medical Nutrition Therapy Follow Up        SUBJECTIVE : Patient extubated since last RD visit, diet advanced. Appetite poor, eating 25% of meals or less. Calorie count ordered by MD. Speech therapy with orders to evaluate - has made attempts but has been unable to complete evaluation due to tests/procedures. Patient receiving line holiday, will plan for diuresis.                                                                                                                                                                                                                    OBJECTIVE:     Current Diet Order/Nutrition Support:   MNT PROTOCOL FOR DIETITIAN  DIET REGULAR Consistency/thickening: LEVEL 2 LIQUID: Mildly Thick (Nectar)     Height Used for Calculations: 165.1 cm ('5\' 5"'$ )  Weight Used For Calculations: 72.4 kg (159 lb 9.8 oz)  BMI (kg/m2): 26.62     Ideal Body Weight (IBW) (kg): 57.29  % Ideal Body Weight: 126.37  Adjusted/Standard Body Weight  Adjusted BW: 61 kg        Estimated Needs:    Energy Calorie Requirements: 1550-1850 kcal per day (25-30 kcals/61 kg)  Protein Requirements (gms/day): 57 g protein per day (1.0 g/57.3 kg)      Comments: Pt admitted as a transfer from [facility]. Per chart review, pt remains intubated, on ventilator support, and in restraints. Cardiac Surgery evaluating for surgical intervention. 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     Ref. Range 02/11/2021 00:18 02/11/2021 00:19 02/11/2021 04:40 02/12/2021 01:58   BUN Latest Ref Range: 8 - 25 mg/dL 22   44 (H)   CREATININE Latest Ref Range: 0.60 - 1.05 mg/dL 2.12 (H)   4.10 (H)     Plan/Interventions :   Continue current diet, encourage po intake at meal times.   Add supplements to promote oral intake - Mighty Shake TID and Ensure Clear BID. Monitor renal function and need for adjustment of supplements.  Trend weights.  Monitor renal function/mode of therapy and adjust needs as medically able.  Recommend daily  nephronex.  Calorie count to be initiated - will run 8/29-8/31.  RD to continue to follow.        Nutrition Diagnosis: Inability to swallow related to Patient on vent as evidenced by Need for TF (resolved)    Nutrition Diagnosis: Inadequate protein-energy intake related to decreased appetite as evidenced by po intake 25% or less of meals.  Melody Comas, RDLD  02/12/2021, 09:32  Pager [phone]

## 2021-02-12 NOTE — Anesthesia Preprocedure Evaluation (Addendum)
ANESTHESIA PRE-OP EVALUATION  Planned Procedure: REPAIR VALVE TRICUSPID (N/A Chest)  PERFUSION CHARGE/STBY/CARDIOPULMONARY BYPASS (N/A )  AUTOLOGOUS BLOOD CONSERVATION SETUP (N/A )  AUTOLOGOUS BLOOD CONSERVATION, MONITORING PER HOUR (N/A )  REPLACEMENT VALVE TRICUSPID (N/A Chest)    Per previous providers: "Pt is a 35 yo female with a significant PMH forSmoking andIVDU presented to [facility] with AMS on 01/31/21. She was found to have a drug overdose, aspiration PNA requiring intubation, MRSA bacteremia, large TV endocarditis with moderate TR and elevated systemic pulmonary pressures (61mhg), AKI requiring HD, anemia requiring PRBC and thrombocytopenia.Urine drug screen + for heroin and methamphetamines.    [facility]."    Review of Systems     anesthesia history negative               Pulmonary   pneumonia,   Cardiovascular    Valvular problems/murmurs and MR and TR ,No peripheral edema, no past MI and no DVT,  Exercise Tolerance: <4 METS        GI/Hepatic/Renal    Previously on HD. Line holiday given bacteremia. Volume status improving.  no GERD    dialysis treatment      Endo/Other         Neuro/Psych/MS  Chronic opioid use  Chronic IV drug use, fentanyl and heroin, Substance abuse and stimulants     Cancer    negative hematology/oncology ROS,                   Physical Assessment      Airway       Mallampati: II    TM distance: >3 FB    Neck ROM: limited  Mouth Opening: poor.  No Facial hair  No Beard  No endotracheal tube present  No Tracheostomy present    Dental           (+) poor dentition        Comment: S/p dental extractions        Pulmonary      (+) decreased breath sounds present        Cardiovascular    Rhythm: regular  Rate: Normal  (-) no friction rub, carotid bruit is not present, no peripheral edema and no murmur     Other findings            Plan  ASA 4     Planned anesthesia type:  general     general anesthesia with endotracheal tube intubation    plan to administer opioids postoperatively        Additional Plans: Arterial line, Central line, CVP and TEE      Intravenous induction     Anesthesia issues/risks discussed are: Dental Injuries, Nerve Injuries, Eye /Visual Loss, PONV, Aspiration, Intraoperative Awareness/ Recall, Stroke, Sore Throat, Cardiac Events/MI, Difficult Airway, Blood Loss, Art Line Placement, Central Line Placement, Post-op Pain Management, Post-op Intubation/Ventilation and Post-op Cognitive Dysfunction.  Anesthetic plan and risks discussed with mother  Signed consent obtained        Use of blood products discussed with mother who consented to blood products.     Patient's NPO status is appropriate for Anesthesia.           Plan discussed with resident.              Relevant Labs:    CBC with Diff (Last 24 Hours):    Recent Results last 24 hours  02/12/21  0158   WBC 14.0*   HGB 8.2*   HCT 26.4*   MCV 79.3   PLTCNT 247     BMP (Last 48 Hours):    Recent Results in last 48 hours     02/11/21  0018 02/12/21  0158   SODIUM 134* 133*   POTASSIUM 4.4 4.5   CHLORIDE 102 100   CO2 24 22   BUN 22 44*   CREATININE 2.12* 4.10*   CALCIUM 7.8* 7.6*   GLUCOSENF 97 97     Imaging:  TTE on 02/05/21:  Tricuspid vegetation, mild mitral regurgitation, EF of 62%    EKG on 02/05/21:  NSR    CXR on : 02/12/21  Stable lung aeration with bilateral patchy and cavitary opacities.    CT on 02/05/21:  Patchy opacities with some cavitation consistent with septic emboli. Opacities in lung bases    Lines/Drains/Airways:  2 PIV x1 day     Previous Anesthesia:  Hysterectomy at outside facility per patient unknown date  Multiple teeth extractions - no airway documentation found       Plan:  Type/Cross ordered in preparation for OR   Anesthesia considerations: given difficulty with mouth opening and limited ROM of neck, would suggest video laryngoscopy if planning to do general anesthetic.    Consent for  general anesthesia was obtained from mother and all questions were answered.  Consent was witnessed and placed in the patient's chart.      Lavena Stanford, DO  Anesthesiology  PGY-4/CA-3  02/12/21

## 2021-02-12 NOTE — Procedures (Signed)
[facility] Catheter Placement      Procedure Date:  02/12/2021  Time:  1100  Procedure: left [facility] Catheter Placement  Diagnosis:  ARF  Indication:  Need for Hemodialysis    Description:  After informed consent the patient was positioned. A surgical time out was performed to confirm correct patient and procedure.  The site was identified and prepped in the usual sterile fashion. An appropriate site was identified using ultrasound. The skin was prepped with chlorhexidine and 1%, Lidocaine plain analgesia was used.  A needle was advanced into the vessel and dark, non-pulsatile blood was aspirated. The left  Femoral vein was cannulated A guidewire was then inserted through the needle and the needle removed. A small skin nick was made with a scalpel along the guidewire. Two sequential dilators were advanced over the guidewire. The dialysis catheter was then advanced over the guidewire and the guidewire was removed. All ports drew blood back and flushed easily. The catheter was sutured and dressed in sterile fashion.  Patient tolerated procedure without complications. A CXR is not indicated for placement. Wire was visualized under ultrasound and within the femoral vein.       Kathaleen Bury, APRN,AGACNP-BC 02/12/2021, 11:17      I was present and supervised/observed the entire procedure.  Hilliard Clark, MD

## 2021-02-12 NOTE — Nurses Notes (Signed)
Completed HD without issues. Total UF 1 L. Transfused 2 units PRBCs without S/S of reaction.
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
## 2021-02-12 NOTE — Progress Notes (Signed)
[facility]  HVI Critical Care Consult/Progress Note     Elliott, [name] [name], 35 y.o. female  Date of Birth: November 22, 1985  Medical Record Number:  [id number]   Inpatient Admission Date: 02/05/2021   Hospital Day:  LOS: 0 days      Chief Complaint: Altered Mental Status   History of Present Illness: Per previous providers: "Pt is a 35 yo female with a significant PMH for Smoking and IVDU presented to [name] with AMS on 01/31/21. She was found to have a drug overdose, aspiration PNA requiring intubation, MRSA bacteremia, large TV endocarditis with moderate TR and elevated systemic pulmonary pressures (39mhg), AKI requiring HD, anemia requiring PRBC and thrombocytopenia. Urine drug screen + for heroin and methamphetamines.     [facility]."   [facility]: [facility]  8/22: See above.   -Prop for sedation. Agitated; MAE; does not follow. Repeat CT Head/CAP. Hemodynamics stable. Intubated. Repeated blood cultures. Continue Vanc.   8/23: had pan CT 8/22, still not awakening/folowing commands on precedex  But agitated. MRI brain ordered, started on CRRT  8/24: remains on CRRT, MRI Brain no abnormality, following commands on dexmedetomidine, quetiapine, prn dilaudid  8/25 brocnh today for increased ETT secretions,plan for OR early next week, OMFS following for dental root extriction.  Pt self-extubated this afternoon.  Doing well on NC.  8/26: Intermittent delirium. Weak. Continue Seroquel.  Hemodynamics stable. NC. Aggressive pulm toilet. Continue nebs. Speech to see. Dental procedure today. Continues + BC. ID following; MRI CTLS ordered. Continue antibiotics. Surgical plan TBA.   8/27:  MRI completed today. Intermittent dex. Surgical plan TBA. HD today per nephology. Bedside speech and swallow. Blood repeat Cx tomorrow.   8/28 repeat TTE today for OR later  this week, HD holiday will give 200 mg Lasix for diuresis (followed by diuril 5018mif no response w/lasix) and D/C [place] for line holiday  8/29: OR tomorrow. Agitation. Seroquel. Hemodynamics stable. HD today prior to surgery. New line placed 2/2 line holiday. 2 u PRBC pre op optimization.    Past Medical History: No past medical history on file.    Past Surgical History: No past surgical history on file.   Social History:  Social History            Socioeconomic History    Marital status: Divorced       Spouse name: Not on file    Number of children: Not on file    Years of education: Not on file    Highest education level: Not on file   Occupational History    Not on file   Tobacco Use    Smoking status: Not on file    Smokeless tobacco: Not on file   Substance and Sexual Activity    Alcohol use: Not on file    Drug use: Not on file    Sexual activity: Not on file   Other Topics Concern    Not on file   Social History Narrative    Not on file      Social Determinants of Health      Financial Resource Strain: Not on file   Food Insecurity: Not on file   Transportation Needs: Not on file   Physical Activity: Not on file   Stress: Not on file   Intimate Partner Violence: Not on file   Housing  Stability: Not on file       Family History: Family Medical History:       None                Allergies: No Known Allergies   ROS: UTA altered mental status         Problem List:       Patient Active Problem List   Diagnosis    Endocarditis         Medications:  Scheduled Meds: chlorhexidine gluconate, 15 mL, 2x/day  docusate sodium, 100 mg, 2x/day  famotidine, 10 mg, Daily  NS flush, 2-6 mL, Q8HRS  perflutrn lipid microspheres (DEFINITY) DILUTION injection, 2 mL, Give in Cardiology  propofoL, ,   senna concentrate, 10 mL, 2x/day        IV Infusions: propofoL, Last Rate: 20 mcg/kg/min (02/05/21 0220)        PRN:  NS flush, 2-6 mL, Q1 MIN PRN  insulin lispro, 0-12 Units, Q4H PRN  Pharmacy to Dose Vancomycin, , Daily  PRN  Vancomycin IV Intermittent Dosing, , Daily PRN           Last VS:    Heart Rate: (!) 126  Respiratory Rate: (!) 23  BP (Non-Invasive): 130/84      Physical Exam  Vitals reviewed.   Constitutional:       Appearance: She is ill-appearing.      Comments: Critically ill appearing female, no acute distress, becomes restless at times   HENT:      Mouth/Throat:      Mouth: Mucous membranes are moist.   Eyes:      General: No scleral icterus.        Right eye: No discharge.         Left eye: No discharge.      Pupils: Pupils are equal, round, and reactive to light.   Cardiovascular:      Rate and Rhythm: Regular rhythm. Tachycardia present.      Pulses: Normal pulses.   Pulmonary:      Effort: Pulmonary effort is normal. No respiratory distress.      Comments: Diminished breath sounds throughout  NC  Abdominal:      General: Abdomen is flat. There is no distension.      Palpations: Abdomen is soft. There is no mass.      Hernia: No hernia is present.   Musculoskeletal:         General: Swelling present. Normal range of motion.      Cervical back: No rigidity.      Right lower leg: Edema present.      Left lower leg: Edema present.      Comments: B/l l/e pitting edema (3+)   Skin:     General: Skin is warm and dry.      Capillary Refill: Capillary refill takes less than 2 seconds.      Findings: No bruising, lesion or rash.   Neurological:      General: No focal deficit present.      Mental Status: She is alert.            [x] I have reviewed the patient's vitals signs, the nursing notes, the physician's notes and other progress notes      [x] I have reviewed the laboratory and imaging data     [x] I have discussed this case with CCM team        Systems Based Assessment and Plan:  Neurologic:  Data/Assessment Reviewed    Diagnosis Altered mental status 2/2 encephalopathy   Overdose 2/2 IVDU  IVD withdrawal  Back pain    Course: Critical   Plan Continue quetiapine 50 mg in am, increased dose at night time 100 mg   PRN  pain medication: dilaudid 0.1m ivp prn  MRI Brain 8/23 negative  MRI of the cervical, thoracic, and lumbosacral spine completed to r/o abscess; negative   Day/night policy, reorientation as needed  PT/OT  Serial neuro checks    Dex PRN      Cardiovascular:  Data/Assessment Most Recent Hemodynamics: Reviewed   Cardiovascular support: NA      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 Completed line holiday.   -SCT evaluating: Surgery tomorrow   -Dental: EXT of remaining dentition (21-28)     MAP goal  >685mg        Respiratory:  Data/Assessment: CXR: Reviewed   Most recent VBG: NA  Vent Settings: NA      Diagnosis Acute respiratory failure 2/2 Aspiration PNA and altered mental status  Septic pulmonary emboli  Smoker      Course stable   Plan FIO2 wean as tolerated for O2 sats>92%  Pulm toilet; scheduled nebs         Renal:  Data/Assessment: Reviewed    Diagnosis Acute kidney injury - multifactorial: septic shock 2/2 MRSA TV endocarditis, IVDU  Hypervolemia and anasarca   Course critical   Plan Replacing electrolytes per order  Strict I&O  Will monitor BMP  HD today; last session 8/27. New line placed left fem.       Gastrointestinal:  Data/Assessment: NA for stress ulcer prophylaxis   Diet: Thickened liquids    Bowel Regimen:  Colace, Miralax     Diagnosis Dysphagia  Enlarged liver  Distended gallbladder with mild elevation in Alk phos and bilirubin  constipation   Course  Stable    Plan Continue bowel regimen: colace, miralax  Continue stress ulcer prophylaxis   Speech/bedside to evaluate swallow ; passed   Monitor lfts and abdominal exam - if increasing bilirubin/alk phos and/or abdominal tenderness will obtain gallbladder ultrasound and abdominal duplex       Endocrine:  Data/Assessment Last three Fingerstick glucose: Reviewed   Last HbA1C: NA  Most recent Thyroid results: 2.3      Diagnosis No active diagnosis   Course Stable   Plan Regular SSI every 6 hours  Goal 140-180         Hematologic:  Data/Assessment:  Reviewed    Diagnosis Anemia of chronic disease and Thrombocytopenia   Course Stable   Plan SCDs (Sequential Compression Device)  For DVT Prophylaxis  Sub q heparin  Continue to closely monitor cbc/coags       Infectious Diseases:  Data/Assessment Tmax last 24 hrs: No data recorded.       Diagnosis MRSA TV Endocarditis   Course Critical   Plan Antibiotic therapy  Agent:  Vancomycin  Indication: MRSA      repeat cultures; - follow up results  ID following; see above in Cardio    Will continue to monitor for additional s/s of infection   8/29: New trialysis line placed 2/2 line holiday  Family Communication and Disposition:  Data/Assessment Patient updated with plan of care      I independently of the faculty provider spent a total of 42 minutes in direct/indirect care of this patient including initial evaluation, review of laboratory, radiology, diagnostic studies, review of medical record, order entry and coordination of care.    Kathaleen Bury, APRN,AGACNP-BC  02/12/2021, 12:38    Critical Care Staff  I personally saw and examined the patient.    Discussed treatment plan with midlevel.  I agree with the plan of care as documented.   Plan developed together  Electronically Signed by:    Hilliard Clark, MD 02/12/2021, 16:57

## 2021-02-12 NOTE — Care Plan (Signed)
Patient resting on RA.     Continues to work with VPEP Q4 w/a for mobilization of secretions.     Breathing treatments:     Xopenex Q6 PRN  Atrovent Q6 PRN    RT will continue to monitor, and encourage deep breathing and cough.

## 2021-02-13 ENCOUNTER — Encounter (HOSPITAL_COMMUNITY)
Admission: AD | Disposition: A | Discharge status: Home / Self Care | Payer: Self-pay | Source: Other Acute Inpatient Hospital | Attending: Student in an Organized Health Care Education/Training Program

## 2021-02-13 ENCOUNTER — Inpatient Hospital Stay (HOSPITAL_COMMUNITY): Payer: Medicaid Other | Admitting: Student in an Organized Health Care Education/Training Program

## 2021-02-13 ENCOUNTER — Encounter (HOSPITAL_COMMUNITY): Payer: Self-pay | Admitting: THORACIC SURGERY CARDIOTHORACIC VASCULAR SURGERY

## 2021-02-13 ENCOUNTER — Inpatient Hospital Stay (HOSPITAL_COMMUNITY): Payer: Medicaid Other

## 2021-02-13 ENCOUNTER — Inpatient Hospital Stay (HOSPITAL_COMMUNITY): Payer: Medicaid Other | Admitting: Radiology

## 2021-02-13 DIAGNOSIS — Z6824 Body mass index (BMI) 24.0-24.9, adult: Secondary | ICD-10-CM

## 2021-02-13 DIAGNOSIS — I76 Septic arterial embolism: Secondary | ICD-10-CM

## 2021-02-13 DIAGNOSIS — I2699 Other pulmonary embolism without acute cor pulmonale: Secondary | ICD-10-CM

## 2021-02-13 DIAGNOSIS — I10 Essential (primary) hypertension: Secondary | ICD-10-CM

## 2021-02-13 DIAGNOSIS — I361 Nonrheumatic tricuspid (valve) insufficiency: Secondary | ICD-10-CM

## 2021-02-13 DIAGNOSIS — M6250 Muscle wasting and atrophy, not elsewhere classified, unspecified site: Secondary | ICD-10-CM

## 2021-02-13 DIAGNOSIS — J9811 Atelectasis: Secondary | ICD-10-CM

## 2021-02-13 DIAGNOSIS — E46 Unspecified protein-calorie malnutrition: Secondary | ICD-10-CM

## 2021-02-13 DIAGNOSIS — Q211 Atrial septal defect: Secondary | ICD-10-CM

## 2021-02-13 DIAGNOSIS — T50901A Poisoning by unspecified drugs, medicaments and biological substances, accidental (unintentional), initial encounter: Secondary | ICD-10-CM

## 2021-02-13 DIAGNOSIS — Z952 Presence of prosthetic heart valve: Secondary | ICD-10-CM

## 2021-02-13 DIAGNOSIS — Z9889 Other specified postprocedural states: Secondary | ICD-10-CM

## 2021-02-13 DIAGNOSIS — B1920 Unspecified viral hepatitis C without hepatic coma: Secondary | ICD-10-CM

## 2021-02-13 DIAGNOSIS — N179 Acute kidney failure, unspecified: Secondary | ICD-10-CM

## 2021-02-13 DIAGNOSIS — Z4659 Encounter for fitting and adjustment of other gastrointestinal appliance and device: Secondary | ICD-10-CM

## 2021-02-13 DIAGNOSIS — R54 Age-related physical debility: Secondary | ICD-10-CM

## 2021-02-13 DIAGNOSIS — I749 Embolism and thrombosis of unspecified artery: Secondary | ICD-10-CM

## 2021-02-13 DIAGNOSIS — Z4682 Encounter for fitting and adjustment of non-vascular catheter: Secondary | ICD-10-CM

## 2021-02-13 DIAGNOSIS — J90 Pleural effusion, not elsewhere classified: Secondary | ICD-10-CM

## 2021-02-13 DIAGNOSIS — B9562 Methicillin resistant Staphylococcus aureus infection as the cause of diseases classified elsewhere: Secondary | ICD-10-CM

## 2021-02-13 DIAGNOSIS — R931 Abnormal findings on diagnostic imaging of heart and coronary circulation: Secondary | ICD-10-CM

## 2021-02-13 DIAGNOSIS — R578 Other shock: Secondary | ICD-10-CM

## 2021-02-13 DIAGNOSIS — R7881 Bacteremia: Secondary | ICD-10-CM

## 2021-02-13 DIAGNOSIS — I071 Rheumatic tricuspid insufficiency: Secondary | ICD-10-CM

## 2021-02-13 DIAGNOSIS — Z452 Encounter for adjustment and management of vascular access device: Secondary | ICD-10-CM

## 2021-02-13 LAB — POC ARTERIAL BLOOD GAS HVI (RESULT)
%FIO2 (ARTERIAL): 100 %
%FIO2 (ARTERIAL): 100 %
%FIO2 (ARTERIAL): 100 %
%FIO2 (ARTERIAL): 70 %
%FIO2 (ARTERIAL): 80 %
%FIO2 (ARTERIAL): 100 %
BASE DEFICIT: 0.2 mmol/L (ref 0.0–3.0)
BASE DEFICIT: 3.8 mmol/L — ABNORMAL HIGH (ref 0.0–3.0)
BASE DEFICIT: 3.9 mmol/L — ABNORMAL HIGH (ref 0.0–3.0)
BASE DEFICIT: 2 mmol/L (ref 0.0–3.0)
BASE DEFICIT: 3.5 mmol/L — ABNORMAL HIGH (ref 0.0–3.0)
BASE EXCESS (ARTERIAL): 1.5 mmol/L — ABNORMAL HIGH (ref 0.0–1.0)
BICARBONATE (ARTERIAL): 24.8 mmol/L (ref 18.0–26.0)
BICARBONATE (ARTERIAL): 21.9 mmol/L (ref 18.0–26.0)
BICARBONATE (ARTERIAL): 22 mmol/L (ref 18.0–26.0)
BICARBONATE (ARTERIAL): 22.2 mmol/L (ref 18.0–26.0)
BICARBONATE (ARTERIAL): 23.4 mmol/L (ref 18.0–26.0)
BICARBONATE (ARTERIAL): 26.1 mmol/L — ABNORMAL HIGH (ref 18.0–26.0)
CARBOXYHEMOGLOBIN: 2 % (ref 0.0–2.5)
CARBOXYHEMOGLOBIN: 1.6 % (ref 0.0–2.5)
CARBOXYHEMOGLOBIN: 1.9 % (ref 0.0–2.5)
CARBOXYHEMOGLOBIN: 1.8 % (ref 0.0–2.5)
CARBOXYHEMOGLOBIN: 2.5 % (ref 0.0–2.5)
CARBOXYHEMOGLOBIN: 2.3 % (ref 0.0–2.5)
CHLORIDE: 103 mmol/L (ref 101–111)
CHLORIDE: 100 mmol/L — ABNORMAL LOW (ref 101–111)
CHLORIDE: 103 mmol/L (ref 101–111)
CHLORIDE: 100 mmol/L — ABNORMAL LOW (ref 101–111)
CHLORIDE: 101 mmol/L (ref 101–111)
CHLORIDE: 103 mmol/L (ref 101–111)
GLUCOSE: 102 mg/dL (ref 60–105)
GLUCOSE: 112 mg/dL — ABNORMAL HIGH (ref 60–105)
GLUCOSE: 126 mg/dL — ABNORMAL HIGH (ref 60–105)
GLUCOSE: 158 mg/dL — ABNORMAL HIGH (ref 60–105)
GLUCOSE: 170 mg/dL — ABNORMAL HIGH (ref 60–105)
GLUCOSE: 132 mg/dL — ABNORMAL HIGH (ref 60–105)
HEMOGLOBIN: 8.8 g/dL — ABNORMAL LOW (ref 12.0–18.0)
HEMOGLOBIN: 8.1 g/dL — ABNORMAL LOW (ref 12.0–18.0)
HEMOGLOBIN: 8.5 g/dL — ABNORMAL LOW (ref 12.0–18.0)
HEMOGLOBIN: 8.3 g/dL — ABNORMAL LOW (ref 12.0–18.0)
HEMOGLOBIN: 8.8 g/dL — ABNORMAL LOW (ref 12.0–18.0)
HEMOGLOBIN: 8.5 g/dL — ABNORMAL LOW (ref 12.0–18.0)
IONIZED CALCIUM: 1.04 mmol/L
IONIZED CALCIUM: 0.99 mmol/L
IONIZED CALCIUM: 1.01 mmol/L
IONIZED CALCIUM: 1.02 mmol/L
IONIZED CALCIUM: 1.04 mmol/L
IONIZED CALCIUM: 1.1 mmol/L
LACTATE: 0.6 mmol/L (ref 0.0–1.3)
LACTATE: 0.7 mmol/L (ref 0.0–1.3)
LACTATE: 0.8 mmol/L (ref 0.0–1.3)
LACTATE: 1.3 mmol/L (ref 0.0–1.3)
LACTATE: 1.3 mmol/L (ref 0.0–1.3)
LACTATE: 1 mmol/L (ref 0.0–1.3)
MET-HEMOGLOBIN: 0.9 %
MET-HEMOGLOBIN: 0.8 %
MET-HEMOGLOBIN: 1 %
MET-HEMOGLOBIN: 0.6 %
MET-HEMOGLOBIN: 0.9 %
MET-HEMOGLOBIN: 1.1 %
O2 SATURATION (ARTERIAL): 99.1 % — ABNORMAL HIGH (ref 95.0–98.0)
O2 SATURATION (ARTERIAL): 99.1 % — ABNORMAL HIGH (ref 95.0–98.0)
O2 SATURATION (ARTERIAL): 99.7 % — ABNORMAL HIGH (ref 95.0–98.0)
O2 SATURATION (ARTERIAL): 100 % — ABNORMAL HIGH (ref 95.0–98.0)
O2 SATURATION (ARTERIAL): 99.9 % — ABNORMAL HIGH (ref 95.0–98.0)
O2 SATURATION (ARTERIAL): 99.4 % — ABNORMAL HIGH (ref 95.0–98.0)
O2CT: 12.1 %
O2CT: 11.7 %
O2CT: 12.2 %
O2CT: 12.2 %
O2CT: 12.4 %
O2CT: 11.7 %
OXYHEMOGLOBIN: 96.3 %
OXYHEMOGLOBIN: 96.2 %
OXYHEMOGLOBIN: 97.2 %
OXYHEMOGLOBIN: 96.6 %
OXYHEMOGLOBIN: 97.5 %
OXYHEMOGLOBIN: 96 %
PAO2/FIO2 RATIO: 101 (ref <=200)
PAO2/FIO2 RATIO: 106 (ref <=200)
PAO2/FIO2 RATIO: 408 (ref <=200)
PAO2/FIO2 RATIO: 191 (ref <=200)
PAO2/FIO2 RATIO: 443 (ref <=200)
PAO2/FIO2 RATIO: 121 (ref <=200)
PCO2 (ARTERIAL): 34 mm/Hg — ABNORMAL LOW (ref 35–45)
PCO2 (ARTERIAL): 33 mm/Hg — ABNORMAL LOW (ref 35–45)
PCO2 (ARTERIAL): 39 mm/Hg (ref 35–45)
PCO2 (ARTERIAL): 51 mm/Hg — ABNORMAL HIGH (ref 35–45)
PCO2 (ARTERIAL): 53 mm/Hg — ABNORMAL HIGH (ref 35–45)
PCO2 (ARTERIAL): 39 mm/Hg (ref 35–45)
PH (ARTERIAL): 7.45 (ref 7.35–7.45)
PH (ARTERIAL): 7.35 (ref 7.35–7.45)
PH (ARTERIAL): 7.4 (ref 7.35–7.45)
PH (ARTERIAL): 7.27 — ABNORMAL LOW (ref 7.35–7.45)
PH (ARTERIAL): 7.28 — ABNORMAL LOW (ref 7.35–7.45)
PH (ARTERIAL): 7.43 (ref 7.35–7.45)
PO2 (ARTERIAL): 101 mm/Hg — ABNORMAL HIGH (ref 72–100)
PO2 (ARTERIAL): 106 mm/Hg — ABNORMAL HIGH (ref 72–100)
PO2 (ARTERIAL): 408 mm/Hg — ABNORMAL HIGH (ref 72–100)
PO2 (ARTERIAL): 134 mm/Hg — ABNORMAL HIGH (ref 72–100)
PO2 (ARTERIAL): 354 mm/Hg — ABNORMAL HIGH (ref 72–100)
PO2 (ARTERIAL): 121 mm/Hg — ABNORMAL HIGH (ref 72–100)
SODIUM: 130 mmol/L
SODIUM: 127 mmol/L
SODIUM: 130 mmol/L
SODIUM: 130 mmol/L
SODIUM: 132 mmol/L
SODIUM: 132 mmol/L
WHOLE BLOOD POTASSIUM: 4 mmol/L (ref 3.5–4.6)
WHOLE BLOOD POTASSIUM: 3.8 mmol/L (ref 3.5–4.6)
WHOLE BLOOD POTASSIUM: 4.2 mmol/L (ref 3.5–4.6)
WHOLE BLOOD POTASSIUM: 3.8 mmol/L (ref 3.5–4.6)
WHOLE BLOOD POTASSIUM: 3.8 mmol/L (ref 3.5–4.6)
WHOLE BLOOD POTASSIUM: 4.3 mmol/L (ref 3.5–4.6)

## 2021-02-13 LAB — POC ACT PLUS (RESULTS)
ACT PLUS POC: 108 seconds
ACT PLUS POC: 367 seconds — ABNORMAL HIGH
ACT PLUS POC: 436 seconds — ABNORMAL HIGH
ACT PLUS POC: 472 seconds — ABNORMAL HIGH
ACT PLUS POC: 450 seconds — ABNORMAL HIGH
ACT PLUS POC: 105 seconds

## 2021-02-13 LAB — CBC WITH DIFF
BASOPHIL #: <0.1 10*3/uL (ref <=0.20)
BASOPHIL %: 0 %
EOSINOPHIL #: <0.1 10*3/uL (ref <=0.50)
EOSINOPHIL %: 0 %
HCT: 25.1 % — ABNORMAL LOW (ref 34.8–46.0)
HCT: 27.9 % — ABNORMAL LOW (ref 34.8–46.0)
HGB: 8.2 g/dL — ABNORMAL LOW (ref 11.5–16.0)
HGB: 9.3 g/dL — ABNORMAL LOW (ref 11.5–16.0)
IMMATURE GRANULOCYTE #: 0.3 10*3/uL — ABNORMAL HIGH (ref <0.10)
IMMATURE GRANULOCYTE %: 3 % — ABNORMAL HIGH (ref 0–1)
LYMPHOCYTE #: 0.92 10*3/uL — ABNORMAL LOW (ref 1.00–4.80)
LYMPHOCYTE %: 9 %
MCH: 26 pg (ref 26.0–32.0)
MCH: 27.3 pg (ref 26.0–32.0)
MCHC: 32.7 g/dL (ref 31.0–35.5)
MCHC: 33.3 g/dL (ref 31.0–35.5)
MCV: 79.7 fL (ref 78.0–100.0)
MCV: 81.8 fL (ref 78.0–100.0)
MONOCYTE #: 1.01 10*3/uL (ref 0.20–1.10)
MONOCYTE %: 10 %
MPV: 10 fL (ref 8.7–12.5)
NEUTROPHIL #: 8.42 10*3/uL — ABNORMAL HIGH (ref 1.50–7.70)
NEUTROPHIL %: 78 %
PLATELETS: 171 10*3/uL (ref 150–400)
PLATELETS: 129 10*3/uL — ABNORMAL LOW (ref 150–400)
RBC: 3.15 10*6/uL — ABNORMAL LOW (ref 3.85–5.22)
RBC: 3.41 10*6/uL — ABNORMAL LOW (ref 3.85–5.22)
RDW-CV: 20.8 % — ABNORMAL HIGH (ref 11.5–15.5)
RDW-CV: 20.3 % — ABNORMAL HIGH (ref 11.5–15.5)
WBC: 10.7 10*3/uL (ref 3.7–11.0)
WBC: 14.9 10*3/uL — ABNORMAL HIGH (ref 3.7–11.0)

## 2021-02-13 LAB — ARTERIAL BLOOD GAS, CO-OX, LYTES, LACTATE REFLEX
%FIO2 (ARTERIAL): 80 %
BASE EXCESS (ARTERIAL): 0.3 mmol/L (ref 0.0–1.0)
BICARBONATE (ARTERIAL): 25.2 mmol/L (ref 18.0–26.0)
CARBOXYHEMOGLOBIN: 1.9 % (ref 0.0–2.5)
CHLORIDE: 99 mmol/L — ABNORMAL LOW (ref 101–111)
GLUCOSE: 92 mg/dL (ref 60–105)
HEMATOCRITRT: 28 %
HEMOGLOBIN: 9.4 g/dL — ABNORMAL LOW (ref 12.0–18.0)
IONIZED CALCIUM: 1.12 mmol/L (ref 1.10–1.35)
LACTATE: 1 mmol/L (ref 0.0–1.3)
O2CT: 13.8 % — ABNORMAL LOW (ref 15.7–24.3)
OXYHEMOGLOBIN: 97.2 % (ref 85.0–98.0)
PAO2/FIO2 RATIO: 430 (ref <=200)
PCO2 (ARTERIAL): 42 mm/Hg (ref 35–45)
PH (ARTERIAL): 7.39 (ref 7.35–7.45)
PO2 (ARTERIAL): 344 mm/Hg — ABNORMAL HIGH (ref 72–100)
SODIUM: 132 mmol/L — ABNORMAL LOW (ref 137–145)
WHOLE BLOOD POTASSIUM: 4.2 mmol/L (ref 3.5–4.6)

## 2021-02-13 LAB — MANUAL DIFF AND MORPHOLOGY-SYSMEX
BASOPHIL #: <0.1 10*3/uL (ref <=0.20)
BASOPHIL %: 0 %
EOSINOPHIL #: <0.1 10*3/uL (ref <=0.50)
EOSINOPHIL %: 0 %
LYMPHOCYTE #: 0.15 10*3/uL — ABNORMAL LOW (ref 1.00–4.80)
LYMPHOCYTE %: 1 %
MONOCYTE #: 1.34 10*3/uL — ABNORMAL HIGH (ref 0.20–1.10)
MONOCYTE %: 9 %
NEUTROPHIL #: 13.41 10*3/uL — ABNORMAL HIGH (ref 1.50–7.70)
NEUTROPHIL %: 90 %
PROMYELOCYTE % MANUAL: 1 % — ABNORMAL HIGH (ref <=0)

## 2021-02-13 LAB — ELECTROLYTES
ANION GAP: 11 mmol/L (ref 4–13)
CHLORIDE: 101 mmol/L (ref 96–111)
CO2 TOTAL: 24 mmol/L (ref 22–30)
POTASSIUM: 4.3 mmol/L (ref 3.5–5.1)
SODIUM: 136 mmol/L (ref 136–145)

## 2021-02-13 LAB — ARTERIAL BLOOD GAS, CO-OX, LYTES, LACTATE REFLEX - INACTIVE: MET-HEMOGLOBIN: 0.8 % (ref 0.0–2.0)

## 2021-02-13 LAB — POC BLOOD GLUCOSE (RESULTS)
GLUCOSE, POC: 84 mg/dl (ref 70–105)
GLUCOSE, POC: 102 mg/dl (ref 70–105)
GLUCOSE, POC: 84 mg/dl (ref 70–105)
GLUCOSE, POC: 94 mg/dl (ref 70–105)
GLUCOSE, POC: 106 mg/dl — ABNORMAL HIGH (ref 70–105)
GLUCOSE, POC: 107 mg/dl — ABNORMAL HIGH (ref 70–105)
GLUCOSE, POC: 98 mg/dl (ref 70–105)
GLUCOSE, POC: 132 mg/dl — ABNORMAL HIGH (ref 70–105)

## 2021-02-13 LAB — ARTERIAL BLOOD GAS WITH LACTATE REFLEX
%FIO2 (ARTERIAL): 35 %
BASE EXCESS (ARTERIAL): 0.7 mmol/L (ref 0.0–1.0)
BICARBONATE (ARTERIAL): 25.5 mmol/L (ref 18.0–26.0)
LACTATE: 1 mmol/L (ref 0.0–1.3)
PAO2/FIO2 RATIO: 326 (ref <=200)
PCO2 (ARTERIAL): 40 mm/Hg (ref 35–45)
PH (ARTERIAL): 7.41 (ref 7.35–7.45)
PO2 (ARTERIAL): 114 mm/Hg — ABNORMAL HIGH (ref 72–100)

## 2021-02-13 LAB — MAGNESIUM
MAGNESIUM: 1.8 mg/dL (ref 1.8–2.6)
MAGNESIUM: 3 mg/dL — ABNORMAL HIGH (ref 1.8–2.6)

## 2021-02-13 LAB — ADULT ROUTINE BLOOD CULTURE, SET OF 2 BOTTLES (BACTERIA AND YEAST): BLOOD CULTURE, ROUTINE: ABNORMAL — CR

## 2021-02-13 LAB — POC VENOUS BLOOD GAS HVI (RESULT)
%FIO2 (VENOUS): 80 %
BASE DEFICIT: 4.8 mmol/L — ABNORMAL HIGH (ref <=3.0)
BICARBONATE (VENOUS): 20.9 mmol/L — ABNORMAL LOW (ref 22.0–26.0)
CARBOXYHEMOGLOBIN: 2.3 % (ref 0.0–2.5)
CHLORIDE: 101 mmol/L (ref 101–111)
GLUCOSE: 153 mg/dL — ABNORMAL HIGH (ref 60–105)
HEMOGLOBIN: 7.8 g/dL — ABNORMAL LOW (ref 12.0–18.0)
IONIZED CALCIUM: 1.07 mmol/L
LACTATE: 1.2 mmol/L (ref 0.0–1.3)
MET-HEMOGLOBIN: 0.3 %
O2 SATURATION (VENOUS): 81.9 % — ABNORMAL LOW (ref 95.0–98.0)
O2CT: 8.8 %
OXYHEMOGLOBIN: 79.8 %
PCO2 (VENOUS): 54 mm/Hg — ABNORMAL HIGH (ref 41–51)
PH (VENOUS): 7.23 — ABNORMAL LOW (ref 7.31–7.41)
PO2 (VENOUS): 53 mm/Hg — ABNORMAL HIGH (ref 35–50)
SODIUM: 129 mmol/L
WHOLE BLOOD POTASSIUM: 4.4 mmol/L (ref 3.5–4.6)

## 2021-02-13 LAB — CREATININE
CREATININE: 3.08 mg/dL — ABNORMAL HIGH (ref 0.60–1.05)
ESTIMATED GFR: 20 mL/min/BSA — ABNORMAL LOW (ref >=60)

## 2021-02-13 LAB — BPAM PACKED CELL ORDER: UNIT DIVISION: 0

## 2021-02-13 LAB — BASIC METABOLIC PANEL
ANION GAP: 8 mmol/L (ref 4–13)
BUN/CREA RATIO: 9 (ref 6–22)
BUN: 21 mg/dL (ref 8–25)
CALCIUM: 7.1 mg/dL — ABNORMAL LOW (ref 8.5–10.0)
CHLORIDE: 99 mmol/L (ref 96–111)
CO2 TOTAL: 23 mmol/L (ref 22–30)
CREATININE: 2.41 mg/dL — ABNORMAL HIGH (ref 0.60–1.05)
ESTIMATED GFR: 26 mL/min/BSA — ABNORMAL LOW (ref >=60)
GLUCOSE: 101 mg/dL (ref 65–125)
POTASSIUM: 4.1 mmol/L (ref 3.5–5.1)
SODIUM: 130 mmol/L — ABNORMAL LOW (ref 136–145)

## 2021-02-13 LAB — EKG 12-LEAD (POST CARD INTERVENTION FOLLOW UP)
Atrial Rate: 89 {beats}/min
Calculated P Axis: 78 degrees
Calculated R Axis: -4 degrees
Calculated T Axis: 53 degrees
PR Interval: 138 ms
QRS Duration: 74 ms
QT Interval: 356 ms
QTC Calculation: 433 ms
Ventricular rate: 89 {beats}/min

## 2021-02-13 LAB — PT/INR
INR: 1.27 — ABNORMAL HIGH (ref 0.80–1.20)
INR: 1.43 — ABNORMAL HIGH (ref 0.80–1.20)
PROTHROMBIN TIME: 14.7 seconds — ABNORMAL HIGH (ref 9.1–13.9)
PROTHROMBIN TIME: 16.6 seconds — ABNORMAL HIGH (ref 9.1–13.9)

## 2021-02-13 LAB — PHOSPHORUS: PHOSPHORUS: 3.1 mg/dL (ref 2.4–4.7)

## 2021-02-13 LAB — BUN: BUN: 27 mg/dL — ABNORMAL HIGH (ref 8–25)

## 2021-02-13 LAB — TYPE AND CROSS RED CELLS - UNITS: ANTIBODY SCREEN: NEGATIVE

## 2021-02-13 LAB — PTT (PARTIAL THROMBOPLASTIN TIME): APTT: 26.9 seconds (ref 24.2–37.5)

## 2021-02-13 SURGERY — REPLACEMENT VALVE TRICUSPID
Anesthesia: General | Site: Chest | Wound class: Dirty or Infected Wounds-Include old traumatic wounds

## 2021-02-13 MED ORDER — NEOSTIGMINE METHYLSULFATE 1 MG/ML INTRAVENOUS SOLUTION
INTRAVENOUS | Status: AC
Start: 2021-02-13 — End: 2021-02-13
  Filled 2021-02-13: qty 10

## 2021-02-13 MED ORDER — PROPOFOL 10 MG/ML IV BOLUS
INJECTION | Freq: Once | INTRAVENOUS | Status: DC | PRN
Start: 2021-02-13 — End: 2021-02-13
  Administered 2021-02-13: 80 mg via INTRAVENOUS

## 2021-02-13 MED ORDER — PROPOFOL 10 MG/ML INTRAVENOUS EMULSION
INTRAVENOUS | Status: DC | PRN
Start: 2021-02-13 — End: 2021-02-13
  Administered 2021-02-13: 20 ug/kg/min via INTRAVENOUS

## 2021-02-13 MED ORDER — MIDAZOLAM 1 MG/ML INJECTION SOLUTION
INTRAMUSCULAR | Status: AC
Start: 2021-02-13 — End: 2021-02-13
  Filled 2021-02-13: qty 2

## 2021-02-13 MED ORDER — ASCORBIC ACID (VITAMIN C) 500 MG TABLET
2000.0000 mg | ORAL_TABLET | Freq: Once | ORAL | Status: AC
Start: 2021-02-13 — End: 2021-02-13
  Administered 2021-02-13: 2000 mg via ORAL
  Filled 2021-02-13: qty 4

## 2021-02-13 MED ORDER — ELECTROLYTE-A INTRAVENOUS SOLUTION
INTRAVENOUS | Status: DC | PRN
Start: 2021-02-13 — End: 2021-02-13
  Administered 2021-02-13: 0 via INTRAVENOUS

## 2021-02-13 MED ORDER — CEFAZOLIN 1 GRAM SOLUTION FOR INJECTION
Freq: Once | INTRAMUSCULAR | Status: DC | PRN
Start: 2021-02-13 — End: 2021-02-13
  Administered 2021-02-13 (×2): 2000 mg via INTRAVENOUS

## 2021-02-13 MED ORDER — ACETAMINOPHEN 1,000 MG/100 ML (10 MG/ML) INTRAVENOUS SOLUTION
1000.0000 mg | Freq: Once | INTRAVENOUS | Status: AC
Start: 2021-02-13 — End: 2021-02-13
  Administered 2021-02-13: 0 mg via INTRAVENOUS
  Administered 2021-02-13: 1000 mg via INTRAVENOUS
  Filled 2021-02-13: qty 100

## 2021-02-13 MED ORDER — SODIUM CHLORIDE 0.9 % INTRAVENOUS SOLUTION
INTRAVENOUS | Status: DC | PRN
Start: 2021-02-13 — End: 2021-02-13
  Administered 2021-02-13: 2 [IU]/h via INTRAVENOUS
  Administered 2021-02-13 (×2): 3 [IU]/h via INTRAVENOUS

## 2021-02-13 MED ORDER — NOREPINEPHRINE 16MG IN NS 250ML INFUSION - FOR ANES
INTRAVENOUS | Status: DC | PRN
Start: 2021-02-13 — End: 2021-02-13
  Administered 2021-02-13: .05 ug/kg/min via INTRAVENOUS
  Administered 2021-02-13: .06 ug/kg/min via INTRAVENOUS
  Administered 2021-02-13: 0.05 ug/kg/min via INTRAVENOUS
  Administered 2021-02-13: .03 ug/kg/min via INTRAVENOUS
  Administered 2021-02-13: 0 ug/kg/min via INTRAVENOUS
  Administered 2021-02-13 (×2): .03 ug/kg/min via INTRAVENOUS
  Administered 2021-02-13: .07 ug/kg/min via INTRAVENOUS
  Administered 2021-02-13: .05 ug/kg/min via INTRAVENOUS
  Administered 2021-02-13: 0 ug/kg/min via INTRAVENOUS

## 2021-02-13 MED ORDER — SODIUM CHLORIDE 0.9 % (FLUSH) INJECTION SYRINGE
2.0000 mL | INJECTION | Freq: Three times a day (TID) | INTRAMUSCULAR | Status: DC
Start: 2021-02-13 — End: 2021-02-15
  Administered 2021-02-13: 0 mL
  Administered 2021-02-13: 2 mL
  Administered 2021-02-14 – 2021-02-15 (×6): 0 mL

## 2021-02-13 MED ORDER — SODIUM CHLORIDE 0.9 % (FLUSH) INJECTION SYRINGE
2.0000 mL | INJECTION | INTRAMUSCULAR | Status: DC | PRN
Start: 2021-02-13 — End: 2021-02-15

## 2021-02-13 MED ORDER — SODIUM CHLORIDE 0.9 % IRRIGATION SOLUTION
Freq: Once | Status: DC | PRN
Start: 2021-02-13 — End: 2021-02-13
  Administered 2021-02-13: 2000 mL

## 2021-02-13 MED ORDER — OXYCODONE 5 MG TABLET
10.0000 mg | ORAL_TABLET | ORAL | Status: DC | PRN
Start: 2021-02-13 — End: 2021-02-13

## 2021-02-13 MED ORDER — ASCORBIC ACID (VITAMIN C) 500 MG TABLET
500.0000 mg | ORAL_TABLET | Freq: Two times a day (BID) | ORAL | Status: AC
Start: 2021-02-14 — End: 2021-02-17
  Administered 2021-02-14 – 2021-02-17 (×8): 500 mg via ORAL
  Filled 2021-02-13 (×8): qty 1

## 2021-02-13 MED ORDER — VANCOMYCIN 1,000 MG INTRAVENOUS INJECTION
Freq: Once | INTRAVENOUS | Status: DC | PRN
Start: 2021-02-13 — End: 2021-02-13
  Administered 2021-02-13: 1000 mL

## 2021-02-13 MED ORDER — AMINOCAPROIC ACID 250 MG/ML INTRAVENOUS SOLUTION
Freq: Once | INTRAVENOUS | Status: DC | PRN
Start: 2021-02-13 — End: 2021-02-13
  Administered 2021-02-13: 10 g via INTRAVENOUS

## 2021-02-13 MED ORDER — LIDOCAINE (PF) 100 MG/5 ML (2 %) INTRAVENOUS SYRINGE
INJECTION | Freq: Once | INTRAVENOUS | Status: DC | PRN
Start: 2021-02-13 — End: 2021-02-13
  Administered 2021-02-13: 80 mg via INTRAVENOUS

## 2021-02-13 MED ORDER — VANCOMYCIN 1,000 MG IV POWDER - FOR OR
Freq: Once | TOPICAL | Status: DC | PRN
Start: 2021-02-13 — End: 2021-02-13
  Administered 2021-02-13: 4 g via TOPICAL

## 2021-02-13 MED ORDER — VANCOMYCIN 1,000 MG INTRAVENOUS INJECTION
INTRAVENOUS | Status: AC
Start: 2021-02-13 — End: 2021-02-13
  Filled 2021-02-13: qty 40

## 2021-02-13 MED ORDER — HYDROMORPHONE 1 MG/ML INJECTION WRAPPER
INJECTION | INTRAMUSCULAR | Status: AC
Start: 2021-02-13 — End: 2021-02-13
  Filled 2021-02-13: qty 1

## 2021-02-13 MED ORDER — HYDROMORPHONE 1 MG/ML INJECTION WRAPPER
0.2000 mg | INJECTION | INTRAMUSCULAR | Status: DC | PRN
Start: 2021-02-13 — End: 2021-02-16
  Administered 2021-02-13 – 2021-02-16 (×11): 0.2 mg via INTRAVENOUS
  Filled 2021-02-13 (×10): qty 1

## 2021-02-13 MED ORDER — BISACODYL 10 MG RECTAL SUPPOSITORY
10.0000 mg | Freq: Every day | RECTAL | Status: DC | PRN
Start: 2021-02-13 — End: 2021-03-02

## 2021-02-13 MED ORDER — SODIUM CHLORIDE 0.9 % INTRAVENOUS SOLUTION
INTRAVENOUS | Status: AC
Start: 2021-02-13 — End: 2021-02-13
  Filled 2021-02-13: qty 6

## 2021-02-13 MED ORDER — THROMBIN (RECOMBINANT) 5,000 UNIT TOPICAL SOLUTION
CUTANEOUS | Status: AC
Start: 2021-02-13 — End: 2021-02-13
  Filled 2021-02-13: qty 1

## 2021-02-13 MED ORDER — ANTITHROMBIN III (HUMAN) 500 (+/-) UNIT INTRAVENOUS SOLUTION
Freq: Once | INTRAVENOUS | Status: DC | PRN
Start: 2021-02-13 — End: 2021-02-13
  Administered 2021-02-13: 573 [IU] via INTRAVENOUS

## 2021-02-13 MED ORDER — ELECTROLYTE-A INTRAVENOUS SOLUTION BOLUS
250.0000 mL | INTRAVENOUS | Status: AC
Start: 2021-02-13 — End: 2021-02-13
  Administered 2021-02-13: 0 mL via INTRAVENOUS
  Administered 2021-02-13: 250 mL via INTRAVENOUS

## 2021-02-13 MED ORDER — SODIUM CHLORIDE 0.9 % IV BOLUS
40.0000 mL | INJECTION | Freq: Once | Status: AC | PRN
Start: 2021-02-13 — End: 2021-02-13

## 2021-02-13 MED ORDER — MUPIROCIN 2 % TOPICAL OINTMENT
TOPICAL_OINTMENT | Freq: Two times a day (BID) | CUTANEOUS | Status: AC
Start: 2021-02-13 — End: 2021-02-17
  Filled 2021-02-13: qty 22

## 2021-02-13 MED ORDER — CALCIUM CHLORIDE 100 MG/ML (10 %) INTRAVENOUS SYRINGE
INJECTION | Freq: Once | INTRAVENOUS | Status: DC | PRN
Start: 2021-02-13 — End: 2021-02-13
  Administered 2021-02-13 (×5): 200 mg via INTRAVENOUS

## 2021-02-13 MED ORDER — NOREPINEPHRINE BITARTRATE 16 MG/250 ML (64 MCG/ML) IN 0.9 % NACL IV
0.0300 ug/kg/min | INTRAVENOUS | Status: DC
Start: 2021-02-13 — End: 2021-02-13
  Administered 2021-02-13: 0.03 ug/kg/min via INTRAVENOUS
  Administered 2021-02-13: 0.05 ug/kg/min via INTRAVENOUS
  Administered 2021-02-13: 0.03 ug/kg/min via INTRAVENOUS
  Administered 2021-02-13: 0 ug/kg/min via INTRAVENOUS
  Administered 2021-02-13: 0.03 ug/kg/min via INTRAVENOUS
  Administered 2021-02-13: 0.02 ug/kg/min via INTRAVENOUS
  Administered 2021-02-13 (×3): 0.03 ug/kg/min via INTRAVENOUS

## 2021-02-13 MED ORDER — OXYCODONE 5 MG TABLET
5.0000 mg | ORAL_TABLET | ORAL | Status: DC | PRN
Start: 2021-02-13 — End: 2021-02-13

## 2021-02-13 MED ORDER — VANCOMYCIN 1,000 MG INTRAVENOUS INJECTION
INTRAVENOUS | Status: AC
Start: 2021-02-13 — End: 2021-02-13
  Filled 2021-02-13: qty 10

## 2021-02-13 MED ORDER — INSULIN LISPRO 100 UNIT/ML SUB-Q SSIP
0.0000 [IU] | INJECTION | Freq: Four times a day (QID) | SUBCUTANEOUS | Status: DC | PRN
Start: 2021-02-13 — End: 2021-02-15
  Administered 2021-02-14: 2 [IU] via SUBCUTANEOUS
  Filled 2021-02-13: qty 300

## 2021-02-13 MED ORDER — PHENYLEPHRINE 50 MG/250 ML (200 MCG/ML) IN 0.9 % SODIUM CHLORIDE IV
INTRAVENOUS | Status: AC
Start: 2021-02-13 — End: 2021-02-13
  Filled 2021-02-13: qty 250

## 2021-02-13 MED ORDER — PROTAMINE 10 MG/ML INTRAVENOUS SOLUTION
Freq: Once | INTRAVENOUS | Status: DC | PRN
Start: 2021-02-13 — End: 2021-02-13
  Administered 2021-02-13: 300 mg via INTRAVENOUS

## 2021-02-13 MED ORDER — FENTANYL (PF) 50 MCG/ML INJECTION SOLUTION
Freq: Once | INTRAMUSCULAR | Status: DC | PRN
Start: 2021-02-13 — End: 2021-02-13
  Administered 2021-02-13: 50 ug via INTRAVENOUS
  Administered 2021-02-13: 150 ug via INTRAVENOUS
  Administered 2021-02-13 (×3): 50 ug via INTRAVENOUS
  Administered 2021-02-13: 150 ug via INTRAVENOUS

## 2021-02-13 MED ORDER — DEXMEDETOMIDINE 100 MCG/ML INTRAVENOUS SOLUTION
0.2000 ug/kg/h | INTRAVENOUS | Status: DC
Start: 2021-02-13 — End: 2021-02-14
  Administered 2021-02-13 (×2): 0.4 ug/kg/h via INTRAVENOUS
  Administered 2021-02-13: 0.2 ug/kg/h via INTRAVENOUS
  Administered 2021-02-13: 0.4 ug/kg/h via INTRAVENOUS
  Administered 2021-02-13: 0.2 ug/kg/h via INTRAVENOUS
  Administered 2021-02-13: 0 ug/kg/h via INTRAVENOUS
  Administered 2021-02-13: 0.4 ug/kg/h via INTRAVENOUS
  Filled 2021-02-13: qty 4

## 2021-02-13 MED ORDER — LEVALBUTEROL CONCENTRATE 1.25 MG/0.5 ML SOLUTION FOR NEBULIZATION
1.2500 mg | INHALATION_SOLUTION | RESPIRATORY_TRACT | Status: DC | PRN
Start: 2021-02-13 — End: 2021-02-27
  Administered 2021-02-14: 1.25 mg via RESPIRATORY_TRACT
  Filled 2021-02-13 (×2): qty 1

## 2021-02-13 MED ORDER — HYDROMORPHONE 1 MG/ML INJECTION WRAPPER
INJECTION | Freq: Once | INTRAMUSCULAR | Status: DC | PRN
Start: 2021-02-13 — End: 2021-02-13
  Administered 2021-02-13 (×2): .5 mg via INTRAVENOUS

## 2021-02-13 MED ORDER — PHENYLEPHRINE 1 MG/10 ML (100 MCG/ML) IN 0.9 % SOD.CHLORIDE IV SYRINGE
INJECTION | Freq: Once | INTRAVENOUS | Status: DC | PRN
Start: 2021-02-13 — End: 2021-02-13
  Administered 2021-02-13 (×2): 100 ug via INTRAVENOUS
  Administered 2021-02-13: 200 ug via INTRAVENOUS
  Administered 2021-02-13: 100 ug via INTRAVENOUS
  Administered 2021-02-13: 150 ug via INTRAVENOUS

## 2021-02-13 MED ORDER — ELECTROLYTE-A INTRAVENOUS SOLUTION
INTRAVENOUS | Status: DC
Start: 2021-02-13 — End: 2021-02-14
  Administered 2021-02-13: 0 mL via INTRAVENOUS

## 2021-02-13 MED ORDER — METHADONE 10 MG/ML INJECTION SOLUTION
Freq: Once | INTRAMUSCULAR | Status: DC | PRN
Start: 2021-02-13 — End: 2021-02-13
  Administered 2021-02-13 (×2): 5 mg via INTRAVENOUS

## 2021-02-13 MED ORDER — VASOPRESSIN 20 UNIT/ML INTRAVENOUS SOLUTION
Freq: Once | INTRAVENOUS | Status: DC | PRN
Start: 2021-02-13 — End: 2021-02-13
  Administered 2021-02-13 (×2): .5 [IU] via INTRAVENOUS
  Administered 2021-02-13: 1 [IU] via INTRAVENOUS
  Administered 2021-02-13 (×3): .5 [IU] via INTRAVENOUS

## 2021-02-13 MED ORDER — ANTITHROMBIN III (HUMAN) 500 (+/-) UNIT INTRAVENOUS SOLUTION
INTRAVENOUS | Status: AC
Start: 2021-02-13 — End: 2021-02-13
  Filled 2021-02-13: qty 500

## 2021-02-13 MED ORDER — INSULIN REGULAR HUMAN 100 UNIT/ML INJ FOR MIXTURES -RX ADMIN UNIT ROUNDS TO NEAREST 0.01 ML
Freq: Once | INTRAMUSCULAR | Status: DC | PRN
Start: 2021-02-13 — End: 2021-02-13
  Administered 2021-02-13: 2 [IU] via INTRAVENOUS

## 2021-02-13 MED ORDER — CALCIUM CHLORIDE 100 MG/ML (10 %) INTRAVENOUS SYRINGE
1000.0000 mg | INJECTION | Freq: Once | INTRAVENOUS | Status: AC
Start: 2021-02-13 — End: 2021-02-13
  Administered 2021-02-13: 1000 mg via INTRAVENOUS
  Filled 2021-02-13: qty 10

## 2021-02-13 MED ORDER — ELECTROLYTE-A INTRAVENOUS SOLUTION
INTRAVENOUS | Status: DC | PRN
Start: 2021-02-13 — End: 2021-02-13

## 2021-02-13 MED ORDER — ASPIRIN 81 MG CHEWABLE TABLET
81.0000 mg | CHEWABLE_TABLET | Freq: Every day | ORAL | Status: DC
Start: 2021-02-13 — End: 2021-03-02
  Administered 2021-02-13 – 2021-03-02 (×18): 81 mg via ORAL
  Filled 2021-02-13 (×18): qty 1

## 2021-02-13 MED ORDER — POTASSIUM CHLORIDE 10 MEQ/100ML IN STERILE WATER INTRAVENOUS PIGGYBACK
INJECTION | Freq: Once | INTRAVENOUS | Status: DC | PRN
Start: 2021-02-13 — End: 2021-02-13
  Administered 2021-02-13: 20 meq via INTRAVENOUS

## 2021-02-13 MED ORDER — MIDAZOLAM (PF) 1 MG/ML INJECTION SOLUTION
Freq: Once | INTRAMUSCULAR | Status: DC | PRN
Start: 2021-02-13 — End: 2021-02-13
  Administered 2021-02-13: 2 mg via INTRAVENOUS

## 2021-02-13 MED ORDER — AMINOCAPROIC ACID 5 G IN NS 250ML INFUSION - FOR ANES
INTRAVENOUS | Status: DC | PRN
Start: 2021-02-13 — End: 2021-02-13
  Administered 2021-02-13: 1 g/h via INTRAVENOUS
  Administered 2021-02-13 (×2): 0 g/h via INTRAVENOUS

## 2021-02-13 MED ORDER — METHADONE 10 MG/ML INJECTION SOLUTION
INTRAMUSCULAR | Status: AC
Start: 2021-02-13 — End: 2021-02-13
  Filled 2021-02-13: qty 1

## 2021-02-13 MED ORDER — CEFAZOLIN 1 GRAM SOLUTION FOR INJECTION
INTRAMUSCULAR | Status: AC
Start: 2021-02-13 — End: 2021-02-13
  Filled 2021-02-13: qty 20

## 2021-02-13 MED ORDER — FENTANYL (PF) 50 MCG/ML INJECTION SOLUTION
INTRAMUSCULAR | Status: AC
Start: 2021-02-13 — End: 2021-02-13
  Filled 2021-02-13: qty 5

## 2021-02-13 MED ORDER — ONDANSETRON HCL (PF) 4 MG/2 ML INJECTION SOLUTION
4.0000 mg | Freq: Three times a day (TID) | INTRAMUSCULAR | Status: DC | PRN
Start: 2021-02-13 — End: 2021-03-02

## 2021-02-13 MED ORDER — THROMBIN (RECOMBINANT) 5,000 UNIT TOPICAL SOLUTION
Freq: Once | CUTANEOUS | Status: DC | PRN
Start: 2021-02-13 — End: 2021-02-13
  Administered 2021-02-13: 5000 [IU] via TOPICAL

## 2021-02-13 MED ORDER — ONDANSETRON HCL (PF) 4 MG/2 ML INJECTION SOLUTION
Freq: Once | INTRAMUSCULAR | Status: DC | PRN
Start: 2021-02-13 — End: 2021-02-13
  Administered 2021-02-13: 4 mg via INTRAVENOUS

## 2021-02-13 MED ORDER — HEPARIN (PORCINE) 5,000 UNIT/ML INJECTION SOLUTION
5000.0000 [IU] | Freq: Three times a day (TID) | INTRAMUSCULAR | Status: DC
Start: 2021-02-14 — End: 2021-02-14

## 2021-02-13 MED ORDER — MAGNESIUM SULFATE 4 GRAM/100 ML (4 %) IN WATER INTRAVENOUS PIGGYBACK
4.0000 g | INJECTION | Freq: Once | INTRAVENOUS | Status: AC
Start: 2021-02-13 — End: 2021-02-13
  Administered 2021-02-13: 0 g via INTRAVENOUS
  Administered 2021-02-13: 4 g via INTRAVENOUS
  Filled 2021-02-13: qty 100

## 2021-02-13 MED ORDER — ACETAMINOPHEN 325 MG TABLET
975.0000 mg | ORAL_TABLET | Freq: Four times a day (QID) | ORAL | Status: AC
Start: 2021-02-13 — End: 2021-02-15
  Administered 2021-02-13 – 2021-02-15 (×8): 975 mg via ORAL
  Filled 2021-02-13 (×6): qty 3

## 2021-02-13 MED ORDER — MAGNESIUM HYDROXIDE 400 MG/5 ML ORAL SUSPENSION
30.0000 mL | Freq: Every day | ORAL | Status: DC | PRN
Start: 2021-02-13 — End: 2021-03-02
  Administered 2021-02-16 – 2021-02-18 (×2): 2400 mg via ORAL
  Filled 2021-02-13 (×2): qty 30

## 2021-02-13 MED ORDER — ALBUMIN, HUMAN 5 % INTRAVENOUS SOLUTION
INTRAVENOUS | Status: DC | PRN
Start: 2021-02-13 — End: 2021-02-13
  Administered 2021-02-13: 0 via INTRAVENOUS

## 2021-02-13 MED ORDER — HEPARIN (PORCINE) 1,000 UNIT/ML INJECTION SOLUTION
Freq: Once | INTRAMUSCULAR | Status: DC | PRN
Start: 2021-02-13 — End: 2021-02-13
  Administered 2021-02-13: 10000 [IU] via INTRAVENOUS
  Administered 2021-02-13: 20000 [IU] via INTRAVENOUS

## 2021-02-13 MED ORDER — ACETAMINOPHEN 325 MG TABLET
650.0000 mg | ORAL_TABLET | ORAL | Status: DC | PRN
Start: 2021-02-15 — End: 2021-03-02
  Administered 2021-02-16 – 2021-02-28 (×24): 650 mg via ORAL
  Filled 2021-02-13 (×24): qty 2

## 2021-02-13 MED ORDER — MANNITOL 20 % INTRAVENOUS SOLUTION
INTRAVENOUS | Status: AC
Start: 2021-02-13 — End: 2021-02-13
  Filled 2021-02-13: qty 250

## 2021-02-13 MED ORDER — CHLORHEXIDINE GLUCONATE 0.12 % MOUTHWASH
15.0000 mL | MOUTHWASH | Freq: Two times a day (BID) | Status: DC
Start: 2021-02-13 — End: 2021-02-13
  Administered 2021-02-13: 15 mL via TOPICAL
  Filled 2021-02-13: qty 15

## 2021-02-13 MED ORDER — INSULIN REGULAR 100 UNIT/100 ML (1 UNIT/ML) IN 0.9 % NACL IV SOLUTION
0.0250 [IU]/kg/h | INTRAVENOUS | Status: DC
Start: 2021-02-13 — End: 2021-02-13
  Administered 2021-02-13: 0 [IU]/kg/h via INTRAVENOUS
  Administered 2021-02-13 (×2): 0.03 [IU]/kg/h via INTRAVENOUS

## 2021-02-13 MED ORDER — ROCURONIUM 10 MG/ML INTRAVENOUS SOLUTION
Freq: Once | INTRAVENOUS | Status: DC | PRN
Start: 2021-02-13 — End: 2021-02-13
  Administered 2021-02-13: 80 mg via INTRAVENOUS
  Administered 2021-02-13: 10 mg via INTRAVENOUS
  Administered 2021-02-13 (×2): 20 mg via INTRAVENOUS

## 2021-02-13 MED ORDER — IPRATROPIUM BROMIDE 0.02 % SOLUTION FOR INHALATION
0.5000 mg | RESPIRATORY_TRACT | Status: DC | PRN
Start: 2021-02-13 — End: 2021-03-02

## 2021-02-13 MED ORDER — EPINEPHRINE 15MG IN NS 250ML INFUSION - FOR ANES
INTRAVENOUS | Status: DC | PRN
Start: 2021-02-13 — End: 2021-02-13
  Administered 2021-02-13: 11:00:00 0 ug/kg/min via INTRAVENOUS
  Administered 2021-02-13: .02 ug/kg/min via INTRAVENOUS
  Administered 2021-02-13: 0 ug/kg/min via INTRAVENOUS
  Administered 2021-02-13: .01 ug/kg/min via INTRAVENOUS

## 2021-02-13 SURGICAL SUPPLY — 64 items
CANNULA 21FR EZ GLD 14IN AUTO DIL TIP SUT RING VENT CONN 3/8IN STR AORTA PERF (PERFUSION/HEART SUPPLIES) ×3 IMPLANT
CANNULA 24FR DLP 12-15IN PVC 1 STG CONN ML PRT TIP ADULT 3/8IN RGT ANG VENOUS PERF (PERFUSION/HEART SUPPLIES) ×3 IMPLANT
CANNULA DUAL MALLEABLE 16 X10_MIN ORDER 10 (WOUND CARE/ENTEROSTOMAL SUPPLY)
CANNULA PERF 21FR AORTIC STR (PERFUSION/HEART SUPPLIES) ×1
CANNULA PERF 24FR VEN SHRT ANG (PERFUSION/HEART SUPPLIES) ×1
CART HEPCON 5L HEP DS RSPN CAR_T SYRG BLUNT TIP NEEDLE (TEST) IMPLANT
CART HEPCON SILVER 2-3.5MG/KG_BLUNT HEP 4 CHNL SYRG NEEDLE (TEST) IMPLANT
CART HMS + HEP ASY 2 CHNL 9 SYRG HI RNG ACT CLOT TIME ANALYZER DISP (PERFUSION/HEART SUPPLIES) IMPLANT
CART HMS + HR-ACT HEP ASY 2 CH_NL 9 SYRG BLUNT NEEDLE (PERFUSION/HEART SUPPLIES)
CART HMS + RD .9- MG/KG 4 CHNL 9 SYRG HEP ASY BLUNT NEEDLE ANALYZER DISP (PERFUSION/HEART SUPPLIES) IMPLANT
CART HMS + RD 0-.9MG/KG HEP AS_Y 4 CHNL 9 SYRG BLUNT NEEDLE (PERFUSION/HEART SUPPLIES)
CART ISTAT HEMA CG8+ ANALYZER (REAGENT) IMPLANT
DRAIN CHEST OASIS DRY SUCT_NEG 40CM CS/6 3600100 (MED SURG SUPPLIES) ×1
DRAIN INCS ATR OAS PLASTIC CHST THOR TUBE DRY SUCT COLLECT CHAMBER STRL LF  DISP ATS BAG (MED SURG SUPPLIES) ×3 IMPLANT
DUP USE ITEM 161800 - RESERVOIR AUTOTRANSFUSION 40 U_M COLLECTION FILTER (MED SURG SUPPLIES) ×3 IMPLANT
DUPE USE ITEM 150679 - FX OXY W HR ART FILTER ADVANCE (PERFUSION/HEART SUPPLIES) ×3 IMPLANT
DUPE USE ITEM 306370 - OXYGENATOR PERF 3CXFX15RE (PERFUSION/HEART SUPPLIES) IMPLANT
FILTER BLOOD TRNSF_SQ40S (IV TUBING & ACCESSORIES)
FILTER O2 PRE BYPASS 1/4X1/4_029029000 25EA/CS (RESPIRATORY/AIRWAY MGMT) ×1
FILTER PERF .25IN O2 STRL (RESPIRATORY/AIRWAY MGMT) ×3 IMPLANT
FILTER TRNSF BLOOD MICAGGR LOW RSDL VOL LF  PALL SQ40 40UM PLSTR DISP (IV TUBING & ACCESSORIES) IMPLANT
FX OXY W HR ART FILTER ADVANCE (PERFUSION/HEART SUPPLIES) ×1
GW AMPLATZSS .035IN 7CM 180CM_LXB FLPY TIP FLTWR COIL SS (VASCULAR) ×1
GW URO .035IN 180CM 7CM AMPLATZSS STR (VASCULAR) ×3 IMPLANT
HEMOCONCENTRATOR DHF06_020230801 CS/8 (PERFUSION/HEART SUPPLIES) ×4 IMPLANT
KIT PERF 76.2CM 25FR BIOMEDICUS NEXTGEN 3/8IN FEM VENOUS 60CM (PERFUSION/HEART SUPPLIES) ×3 IMPLANT
KIT PERF 76.2CM 25FR BIOMEDICU_S NEXTGEN 3/8IN FEM VENOUS 60 (PERFUSION/HEART SUPPLIES) ×1
KIT PLS LAV PLSVC + FAN SPRAY STRL LF  DISP (WOUND CARE SUPPLY) ×3 IMPLANT
KIT PLS LAV PLSVC + FAN SPRAY_STRL LF DISP (WOUND CARE/ENTEROSTOMAL SUPPLY) ×1
KIT PRESS MONITOR 3W STOPCOCK TRANSDUC FLUSH DEV MACDRP TRUWAVE 60IN 12IN STRL DISP MODL PX600F (MED SURG SUPPLIES) IMPLANT
KIT PRESS MONITOR 3W STOPCOCK_TRANSDUC FLSH DEV MACDRP (MED SURG SUPPLIES)
NEEDLE 7CM 18GA WO BASEPLATE P_ROC PERC DISP TW VAS ACCESS (MED SURG SUPPLIES) ×4 IMPLANT
OXYGENATOR ID QUADROX BIOLINE (PERFUSION/HEART SUPPLIES) IMPLANT
OXYGENATOR PERF 3CXFX15RE (PERFUSION/HEART SUPPLIES)
PACK CUSTOM TUBING W/PUMP (PERFUSION/HEART SUPPLIES) IMPLANT
PACK PRC STRL DISP HARV SMRT_PRP 2 LF (MED SURG SUPPLIES) IMPLANT
PACK SURG LOOP RVAD ALLEGHENY GN HOSPITAL (PERFUSION/HEART SUPPLIES) IMPLANT
PACK TUBING ECMO CUSTOM (RESPIRATORY/AIRWAY MGMT) IMPLANT
PUMP CNTRF REV COAT DISP (PERFUSION/HEART SUPPLIES) IMPLANT
RESERVOIR AUTOTRANSFUSION 40 U_M COLLECTION FILTER (MED SURG SUPPLIES) ×1
SENSOR CDI 500_20/CS CDI510H (PERFUSION/HEART SUPPLIES)
SENSOR SHUNT CDI HEP 1.2ML SYS 500 STRL DISP (PERFUSION/HEART SUPPLIES) IMPLANT
SET AUTOTRANS WASH CHAMBER TUB_E AT1 CATS (MED SURG SUPPLIES) ×4 IMPLANT
SET AUTOTRANSFUSION TUBING ATS_SUCTION LINE (MED SURG SUPPLIES) ×4 IMPLANT
SET BLOOD PUMP CENTRIMAG (PERFUSION/HEART SUPPLIES) IMPLANT
SET CARDIOPLEGIA DEL LINE 6FT (IV TUBING & ACCESSORIES)
SET CRDPLG 16 CONVOLUTION (PERFUSION/HEART SUPPLIES) ×4 IMPLANT
SET EXT 3/16IN 6FT LWVL LINE (IV TUBING & ACCESSORIES) IMPLANT
SET VENTRIC AST DEV CENTRIMAG_BLOOD PUMP (PERFUSION/HEART SUPPLIES)
SHEATH INTROD 10CM 5FR PINN .035IN PERI SS SNAPON DIL LOCK KINK RST SMOOTH TRNS SPRG COIL GW 2.5CM (VASCULAR) ×3 IMPLANT
SHEATH INTROD PINNACLE 5FRX10_10/BX RSS501 (VASCULAR) ×1
STOPCOCK PERF 1 WY UNDIR PURGE LINE STRL (IV TUBING & ACCESSORIES) IMPLANT
STOPCOCK PERF 1 WY UNDIR PURGE_LINE STRL (IV TUBING & ACCESSORIES)
TIP APPL 10CM 16GA 2 CANN (WOUND CARE SUPPLY) IMPLANT
TUBING MONITORING PRESS 72IN_50-P172 (MED SURG SUPPLIES)
TUBING PERF 1/2X3/32X6FT_020466101 (PERFUSION/HEART SUPPLIES) IMPLANT
TUBING PERF 1/4X1/16X6FT_020463101 (PERFUSION/HEART SUPPLIES)
TUBING PERF PUMP (PERFUSION/HEART SUPPLIES) IMPLANT
TUBING PERF PUMP .25INX1/16IN STD 72IN SEG STRL (PERFUSION/HEART SUPPLIES) IMPLANT
TUBING PERF PUMP 3/8INX3/32IN 72IN STD DRMTR 68 SHOR STRL (PERFUSION/HEART SUPPLIES) IMPLANT
TUBING PRESS MONITOR 72IN TRUWAVE MALE TO FEMALE CONN TRANSDUC STRL LF  DISP (MED SURG SUPPLIES) IMPLANT
TUBING RVAD LOOP (PERFUSION/HEART SUPPLIES)
TUBING ~~LOC~~ PIGTAIL (PERFUSION/HEART SUPPLIES)
VALVE MITRAL 33MM 31MM 20MM EPIC FLEXFIT LOW PROF BPRTH STENT HLDR PORCINE PRICRD (IMPLANTS CARDIOTHORACIC) ×3 IMPLANT

## 2021-02-13 NOTE — Care Plan (Signed)
Medical Nutrition Therapy Calorie Count Note  I: Estimated Needs:    Energy Calorie Requirements: 1550-1850 kcal per day (25-30 kcals/61 kg)  Protein Requirements (gms/day): 57 g protein per day (1.0 g/57.3 kg)    Calorie Count day 1: 295 calories, 1 grams of protein, which met 16-19 % of estimated calorie needs and 2 % of estimated protein needs.     Tracey Harries, NDTR  02/13/2021, 08:54  Pager#[phone]

## 2021-02-13 NOTE — Respiratory Therapy (Signed)
PATIENT EXTUBATED PER DR. Danelle Earthly AFTER WEANING PARAMETERS WERE MET. PLACED ON 2L NC, NO STRIDOR NOTED. PATIENT RESTING COMFORTABLY TALKING WITH MOM AT BEDSIDE. RT WILL CONTINUE TO MONITOR.

## 2021-02-13 NOTE — OR Surgeon (Signed)
[facility]   Cardiac Surgery   Operative Report     PATIENT NAME: [name] [name] NUMBER:  [id number]   DATE OF OPERATION: February 13, 2021   DATE OF BIRTH: [birth year]   AGE OF PATIENT AT SURGERY:  34 years   WEIGHT OF PATIENT AT SURGERY: Weight: 65.6 kg (144 lb 10 oz)   BODY SURFACE AREA OF PATIENT AT SURGERY:  Estimated body surface area is 1.73 meters squared as calculated from the following:    Height as of this encounter: 1.651 m (5' 5" ).    Weight as of this encounter: 65.6 kg (144 lb 10 oz).                   PREOPERATIVE DIAGNOSES:   1. Tricuspid regurgitation - severe   2. Tricuspid valve pathology, Tricuspid leaflet(s)-modifier for involved leaflet(s), Anterosuperior leaflet   3. Hypertension   4. 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 Malnutrition   6. Acute Renal Failure   7. MRSA Bacteremia   8. IV Drug Abuse   9. Septic Emboli   10.  11. Hepatitis C   Patent Foramen Ovale       NAME OF PROCEDURES:  1. Valve replacement, Tricuspid (TVR), Bioprosthetic-heterograft - Abbott (St. Jude): EPIC Size = 33  [phone])   2. Primary Closure of Patent Foramen Ovale     SURGEON:                                               Leodis Liverpool, MD     ASSISTANTS:    1. 1st Assistant: Tommi Emery, MD - Fellow  2. 2nd Assistant: Shirlee More, PA-C     ANESTHESIOLOGIST:           Emiliano Dyer, MD    ANESTHESIA:                                             General anesthesia with endotracheal intubation.    SPECIMEN:                                      Native tricuspid valve          ESTIMATED BLOOD LOSS:                    Cell Saver    INDICATIONS FOR PROCEDURE and OPERATIVE FINDINGS:  This 35 years old female presented with the above diagnoses.  Prior to surgical intervention, we had a detailed discussion regarding the indications for surgery in this setting as well as the operative strategy, alternatives for treatment, and the associated risks and benefits of surgical  therapy.  We estimated risks of mortality and mortality plus morbidity at 2 % and 12 %, respectively.  The patient verbalized full understanding of these risks and the desire to proceed with surgery.     The performance of this operative procedure has met national standards set out by the [employer].  The patient received preoperative antibiotic prophylaxis in the form of Vancomycin (because of penicillin sensitivity) within 1 hour of  skin incision.  I have ordered for discontinuation of these antibiotics within 48 hours postoperatively.  I have ordered antiplatelet therapy to be commenced postoperatively.    The patient tolerated the procedure well.      DESCRIPTION OF PROCEDURE:  The patient was prepped and draped in standard fashion after induction of double-lumen endotracheal anesthesia and percutaneous placement of central venous catheter, Foley catheter, and arterial line.  A median sternotomy was performed.  The pericardium was marsupialized    The patient was systemically heparinized.      Arterial cannulation was performed with placement of the following cannula:  SITE:  Distal ascending aorta   CANNULA:  21-French EZ Glide arterial cannula   TECHNIQUE:  Open with transesophageal echocardiographic (TEE) guidance     Venous cannulation was performed with placement of the following cannulae:  SITE:  Common femoral vein, Right   CANNULA:  25-French Bio-Medicus multistage venous cannula positioned in the IVC   TECHNIQUE:  Seldinger with transesophageal echocardiographic (TEE) guidance       SITE:  Superior Vena Cava   CANNULA:  Angled Plastic-tipped cannula   TECHNIQUE:  Open with transesophageal echocardiographic (TEE) guidance       Cardiopulmonary bypass was instituted.  The operation was performed with the heart beating.    Caval tapes (tourniquets) were placed and secured around the superior vena cava and the inferior vena cava.  A right atriotomy was performed and the  tricuspid valve exposed. The PFO was identified and closed primarily with running 4-0 Prolene Suture.      Tricuspid valve analysis revealed complete destruction of the anterior leaflet with involvement of the posterior leaflet. The valve was excised.     The tricuspid leaflets were excised.  All of the chords were preserved from the posterior leaflet.  Meticulous irrigation was performed to assure complete removal of any and all particulate matter.  Circumferential valve sutures without pledgets were placed through the tricuspid valve annulus from the ventricular.  These valve sutures were then placed through the sewing ring of the prosthetic tricuspid valve after appropriate preparation.  The tricuspid annulus accepted the prosthetic tricuspid valve and the valve suture were tied down.  The valve was tested and found to function appropriately and be free of regurgitation or perivalvular dysfunction.     The right atriotomy was then closed in a meticulous and stepwise fashion.  The heart was de-aired through the suture line prior to final suture closure.     The Patient remained in normal sinus rhythm for the entire case. Atrial and ventricular pacing wires were placed.  The patient was weaned from cardiopulmonary bypass.  Protamine was administered and heparin was reversed.  The patient was decannulated.  Meticulous hemostasis was achieved.  Chest drains were placed.  The sternum was reapproximated using sternal wires.  Multilayered closure followed and dressings were placed.  All instrument, needle, and sponge counts were correct.  The patient tolerated the procedure very well and was extubated in the operating theatre.  The patient was transferred to the intensive care unit in stable condition with all counts being correct at the end of procedure.    The postcardiopulmonary bypass intraoperative transesophageal echocardiogram revealed well seated valve no PVL.    ASSISTANT ATTESTATION:   1. 1st Assistant: Tommi Emery, MD - Fellow assisted with opening, closing, cannulation, decannulation, establishment of cardiopulmonary bypass, weaning from cardiopulmonary bypass and bedside operative care.  2. 2nd Assistant: Shirlee More, PA-C assisted with opening, closing, establishment  of cardiopulmonary bypass and bedside operative care.     I was the primary surgeon for the entire procedure.  A qualified resident was able to assist and provided assistance as described above.      Leodis Liverpool, MD 02/13/2021 11:04  Department of Cardiovascular and Thoracic Surgery   [facility]

## 2021-02-13 NOTE — Anesthesia Procedure Notes (Addendum)
Arterial Line Procedure    Pt location: In OR  Consent:     Consent given by:  Patient    Risks discussed:  Bleeding and infection    Alternatives discussed:  No treatment  Universal protocol:     Procedure explained and questions answered to patient or proxy's satisfaction: yes      Immediately prior to procedure a time out was called: yes      Patient identity confirmed:  Arm band, provided demographic data and hospital-assigned identification number  Pre-procedure details:     Preparation: Preprocedure hand washing was performed; sterile field was maintained         Skin Prep used: Chlorhexidine gluconate and Isopropyl alcohol  Anesthesia (see MAR for exact dosages):     Anesthesia method:  local infiltration    Local anesthetic:  lidocaine 1% w/o epi   A 20 G Catheter type: Arrow 1 and 3/4 inch in length,  Placed on the left  radial artery  using anatomical landmarks, modified Seldinger, palpation and guidewire With  number of attempts:1.Secured with: transparent dressing   MEDICATIONS:     Post-procedure details:    Patient tolerance of procedure:  Tolerated well, no immediate complications Waveform appropriate, Correlates with cuff, Flush/aspirate well and Calibrated  Complications:none  Performed By:  Performing provider: Cristine Polio, DO Authorizing provider: Emiliano Dyer, MD    Comments:      Cristine Polio, DO  Department of Anesthesiology

## 2021-02-13 NOTE — Procedures (Signed)
Bronchoscopy/Awake Patient       Procedure Date:  02/13/2021 Time:  1500  Procedure: Bronchoscopy  Diagnosis:  TVR  Indication:  Increased secretions    Description: After informed consent the patient was positioned. A surgical time out was performed to confirm correct patient and procedure. Conscious sedation and sterile conditions were maintained for the procedure. The patient underwent fiber-optic bronchoscopy through the endotracheal tube. The vocal cords were of normal appearance and functioned normally. The trachea was normal in appearance and the carina was sharp. All airways were inspected to the level of segmental bronchi. The airways were normal and lobar and segmental bronchi were patent. The no endobronchial lesions or other anatomical abnormality. A moderate amount of tan secretions were encountered and aspirated as completely as possible. There was no bleeding identified.     The bronchoscopy was performed without flouro. A Bronchial Alveolar Lavage was performed in the right lower lobe (RLL)lobe(s)  . The samples were sent to the lab for cell counts, cultures and cytology.     The bronchoscope was removed at the conclusion of the examination.  Patient tolerated procedure without complications.     Noberto Retort, APRN-ACNP-BC 02/13/2021, 15:47  Late entry for 02/13/21. I was present and supervised/observed the entire procedure.    Hilliard Clark, MD

## 2021-02-13 NOTE — Progress Notes (Signed)
[facility]  HVI Critical Care Consult/Progress Note     Casha, Estupinan, 35 y.o. female  Date of Birth: 1986/03/14  Medical Record Number:  [id number]   Inpatient Admission Date: 02/05/2021   Hospital Day:  LOS: 0 days      Chief Complaint: Altered Mental Status   History of Present Illness: Per previous providers: "Pt is a 35 yo female with a significant PMH for Smoking and IVDU presented to [name] with AMS on 01/31/21. She was found to have a drug overdose, aspiration PNA requiring intubation, MRSA bacteremia, large TV endocarditis with moderate TR and elevated systemic pulmonary pressures (48mhg), AKI requiring HD, anemia requiring PRBC and thrombocytopenia. Urine drug screen + for heroin and methamphetamines.     [facility] and surgical evaluation."   POD #/ Procedure: 8/22: admitted to [facility]  8/30: On pump beating heart TVR (Epic 33)  PFO colsure   Hospital Course/Major events:  8/22: See above.   -Prop for sedation. Agitated; MAE; does not follow. Repeat CT Head/CAP. Hemodynamics stable. Intubated. Repeated blood cultures. Continue Vanc.   8/23: had pan CT 8/22, still not awakening/folowing commands on precedex  But agitated. MRI brain ordered, started on CRRT  8/24: remains on CRRT, MRI Brain no abnormality, following commands on dexmedetomidine, quetiapine, prn dilaudid  8/25 brocnh today for increased ETT secretions,plan for OR early next week, OMFS following for dental root extriction.  Pt self-extubated this afternoon.  Doing well on NC.  8/26: Intermittent delirium. Weak. Continue Seroquel.  Hemodynamics stable. NC. Aggressive pulm toilet. Continue nebs. Speech to see. Dental procedure today. Continues + BC. ID following; MRI CTLS ordered. Continue antibiotics. Surgical plan TBA.   8/27:  MRI completed today. Intermittent dex. Surgical plan TBA. HD today per nephology. Bedside speech and swallow. Blood  repeat Cx tomorrow.   8/28 repeat TTE today for OR later this week, HD holiday will give 200 mg Lasix for diuresis (followed by diuril 5053mif no response w/lasix) and D/C [place] for line holiday  8/29: OR tomorrow. Agitation. Seroquel. Hemodynamics stable. HD today prior to surgery. New line placed 2/2 line holiday. 2 u PRBC pre op optimization.   8/30: OR for TVR, PFO closure, out of OR intubated, levophed infusing, normal EF   Past Medical History: No past medical history on file.    Past Surgical History: No past surgical history on file.   Social History:  Social History            Socioeconomic History    Marital status: Divorced       Spouse name: Not on file    Number of children: Not on file    Years of education: Not on file    Highest education level: Not on file   Occupational History    Not on file   Tobacco Use    Smoking status: Not on file    Smokeless tobacco: Not on file   Substance and Sexual Activity    Alcohol use: Not on file    Drug use: Not on file    Sexual activity: Not on file   Other Topics Concern    Not on file   Social History Narrative    Not on file      Social Determinants of Health      Financial Resource Strain: Not on file   Food Insecurity: Not on file   Transportation Needs:  Not on file   Physical Activity: Not on file   Stress: Not on file   Intimate Partner Violence: Not on file   Housing Stability: Not on file       Family History: Family Medical History:       None                Allergies: No Known Allergies   ROS: UTA altered mental status/intubated        Problem List:       Patient Active Problem List   Diagnosis    Endocarditis         Medications:  Scheduled Meds: chlorhexidine gluconate, 15 mL, 2x/day  docusate sodium, 100 mg, 2x/day  famotidine, 10 mg, Daily  NS flush, 2-6 mL, Q8HRS  perflutrn lipid microspheres (DEFINITY) DILUTION injection, 2 mL, Give in Cardiology  propofoL, ,   senna concentrate, 10 mL, 2x/day        IV Infusions: propofoL, Last Rate: 20  mcg/kg/min (02/05/21 0220)        PRN:  NS flush, 2-6 mL, Q1 MIN PRN  insulin lispro, 0-12 Units, Q4H PRN  Pharmacy to Dose Vancomycin, , Daily PRN  Vancomycin IV Intermittent Dosing, , Daily PRN           Last VS:    Heart Rate: (!) 126  Respiratory Rate: (!) 23  BP (Non-Invasive): 130/84      Physical Exam  Vitals reviewed.   Constitutional:       Appearance: She is ill-appearing.      Comments: Critically ill appearing female, no acute distress, becomes restless at times   Eyes:      General: No scleral icterus.        Right eye: No discharge.         Left eye: No discharge.      Pupils: Pupils are equal, round, and reactive to light.   Cardiovascular:      Rate and Rhythm: Regular rhythm.      Pulses: Normal pulses.   Pulmonary:      Effort: Pulmonary effort is normal. No respiratory distress.      Comments: Diminished breath sounds throughout  Intubated, CTs in place to suction  Abdominal:      General: Abdomen is flat. There is no distension.      Palpations: Abdomen is soft. There is no mass.      Hernia: No hernia is present.      Comments: Hypoactive bowel sounds   Musculoskeletal:         General: Swelling present. Normal range of motion.      Cervical back: No rigidity.      Right lower leg: Edema present.      Left lower leg: Edema present.      Comments: B/l l/e pitting edema (3+)   Skin:     General: Skin is warm and dry.      Capillary Refill: Capillary refill takes less than 2 seconds.      Findings: No bruising, lesion or rash.   Neurological:      General: No focal deficit present.      Mental Status: She is alert.            [x] I have reviewed the patient's vitals signs, the nursing notes, the physician's notes and other progress notes      [x] I have reviewed the laboratory and imaging data     [x] I have discussed this case  with CCM team        Systems Based Assessment and Plan:     Neurologic:  Data/Assessment Reviewed    Diagnosis Altered mental status 2/2 encephalopathy   Overdose 2/2 IVDU  IVD  withdrawal  Back pain    Course: Critical   Plan Continue quetiapine 50 mg in am, increased dose at night time 100 mg   PRN pain medication: dilaudid 0.38m ivp prn  MRI Brain 8/23 negative  MRI of the cervical, thoracic, and lumbosacral spine completed to r/o abscess; negative   Day/night policy, reorientation as needed  PT/OT  Serial neuro checks    Dex post op for emergence delirium      Cardiovascular:  Data/Assessment Most Recent Hemodynamics: Reviewed   Cardiovascular support: levo      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 Completed line holiday.   -SCT evaluating: Surgery completed 8/30; repeat blood cultures POD 1  -Dental: EXT of remaining dentition (21-28)   -ASA    MAP goal  >662mg - wean levophed as tolerated        Respiratory:  Data/Assessment: CXR: Reviewed   Most recent VBG: reviewed  Vent Settings: NA      Diagnosis Acute respiratory failure 2/2 Aspiration PNA and altered mental status  Septic pulmonary emboli  Smoker  Mechanical ventilation post op      Course stable   Plan FIO2 wean as tolerated for O2 sats>92%  Plan to extubate once appropriate  Bronch performed due to thick secretions  Pulm toilet; scheduled nebs  Monitor CT output, continue to suction         Renal:  Data/Assessment: Reviewed    Diagnosis Acute kidney injury - multifactorial: septic shock 2/2 MRSA TV endocarditis, IVDU  Hypervolemia and anasarca   Course critical   Plan Replacing electrolytes per order  Strict I&O  Will monitor BMP  HD today; last session 8/27.       Gastrointestinal:  Data/Assessment: NA for stress ulcer prophylaxis   Diet:  NPO    Bowel Regimen:  Colace, Miralax     Diagnosis Dysphagia  Enlarged liver  Distended gallbladder with mild elevation in Alk phos and bilirubin  constipation   Course   Stable    Plan Continue bowel regimen: colace, miralax  Continue stress ulcer prophylaxis   Speech/bedside to evaluate swallow ; passed   Monitor lfts and abdominal exam - if increasing bilirubin/alk phos and/or abdominal tenderness will obtain gallbladder ultrasound and abdominal duplex      Endocrine:  Data/Assessment Last three Fingerstick glucose: Reviewed   Last HbA1C: NA  Most recent Thyroid results: 2.3      Diagnosis No active diagnosis   Course Stable   Plan Cardiac insulin infusion per protocol  Transition to SSI Protocol when appropriate  Goal 140-180         Hematologic:  Data/Assessment:  Reviewed    Diagnosis Anemia of chronic disease and Thrombocytopenia   Course Stable   Plan SCDs (Sequential Compression Device)  For DVT Prophylaxis  Sub q heparin to resume POD 1 if no bleeding  Continue to closely monitor cbc/coags   Monitor CT output      Infectious Diseases:  Data/Assessment Tmax last 24 hrs: No data recorded.  Diagnosis MRSA TV Endocarditis   Course Critical   Plan Antibiotic therapy  Agent:  Vancomycin  Indication: MRSA      repeat cultures; - follow up results; repeat cultures POD 1, follow OR cultures  ID following; see above in Cardio    Will continue to monitor for additional s/s of infection   8/29: New trialysis line placed 2/2 line holiday             Family Communication and Disposition:  Data/Assessment Patient updated with plan of care      I independently of the faculty provider spent a total of 54 minutes in direct/indirect care of this patient including initial evaluation, review of laboratory, radiology, diagnostic studies, review of medical record, order entry and coordination of care.    Iverson Alamin, APRN,AGACNP-BC  02/13/2021, 15:34      Late entry   Critical Care Staff  I personally saw and examined the patient on 02/13/21   Discussed treatment plan with midlevel.    I agree with the plan of care as documented.   Plan developed together  Electronically Signed  by:    Hilliard Clark, MD 02/16/2021, 07:30

## 2021-02-13 NOTE — Anesthesia OR-ICU Handoff (Addendum)
Anesthesia ICU Transfer of Care  [name] [name] is a 35 y.o. ,female, Weight: 65.6 kg (144 lb 10 oz)   had Procedure(s) with comments:  PERFUSION CHARGE/STBY/CARDIOPULMONARY BYPASS  AUTOLOGOUS BLOOD CONSERVATION SETUP  AUTOLOGOUS BLOOD CONSERVATION, MONITORING PER HOUR  REPLACEMENT VALVE TRICUSPID - Epic tissue valve (54m)  STERNOTOMY MEDIASTINAL  CLOSURE PATENT FORAMEN OVALE  performed  02/13/21   Primary Service: CLyla Son MD    Past Medical History:   Diagnosis Date   . 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 MRSA (methicillin resistant Staphylococcus aureus) 02/05/2021    blood      Allergy History as of 02/13/21      No Known Allergies              I completed my ICU transfer of care/ Handoff to the ICU receiving personnel during which we discussed :  Access, Airway, All key and critical aspects of case discussed, Analgesia, Antibiotics, Expectation of post procedure, Fluids/Product, Gave opportunity for questions and acknowledgement of understanding and PMHx      Anesthesia Managment: This is for handoff only . For doses and times see MAR    Last Intra-op: >37 C  Paralytic:rocuronium   Rhythm:sinus tachycardia       Analgesia: fentanyl, hydromorphone and midazolam (methadone)      Fluids: 1500 mL PONV: ondansetron Last antibiotic dose: 2 hr    albumin (250cc, cell saver 380cc) Warming devices: Warming fluids   Airway: 1 attempt Plan for extubation: remain intubated    Lines:A-line and Central line Other: Calcium Chloride (20 mEq Kcl) Infusions : Levophed (Propofol)                            Last OR Temp: Temperature: 37.2 C (99 F)  ABG:  PH (ARTERIAL)   Date Value Ref Range Status   02/13/2021 7.43 7.35 - 7.45 Final     PCO2 (ARTERIAL)   Date Value Ref Range Status   02/13/2021 39 35 - 45 mm/Hg Final     PCO2 (VENOUS)   Date Value Ref Range Status   02/13/2021 54 (H) 41 - 51 mm/Hg Final     PO2 (ARTERIAL)   Date Value Ref Range Status   02/13/2021 121 (H) 72 - 100 mm/Hg Final     PO2 (VENOUS)   Date  Value Ref Range Status   02/13/2021 53 (H) 35 - 50 mm/Hg Final     SODIUM   Date Value Ref Range Status   02/13/2021 132 mmol/L Final     POTASSIUM   Date Value Ref Range Status   02/13/2021 4.1 3.5 - 5.1 mmol/L Final     KETONES   Date Value Ref Range Status   02/05/2021 10  (A) Negative mg/dL Final     WHOLE BLOOD POTASSIUM   Date Value Ref Range Status   02/13/2021 4.3 3.5 - 4.6 mmol/L Final     CHLORIDE   Date Value Ref Range Status   02/13/2021 103 101 - 111 mmol/L Final     CALCIUM   Date Value Ref Range Status   02/13/2021 7.1 (L) 8.5 - 10.0 mg/dL Final     Calculated P Axis   Date Value Ref Range Status   02/05/2021 41 degrees Final     Calculated R Axis   Date Value Ref Range Status   02/05/2021 13 degrees Final     Calculated T Axis   Date  Value Ref Range Status   02/05/2021 21 degrees Final     IONIZED CALCIUM   Date Value Ref Range Status   02/13/2021 1.10 mmol/L Final     LACTATE   Date Value Ref Range Status   02/13/2021 1.0 0.0 - 1.3 mmol/L Final     HEMOGLOBIN   Date Value Ref Range Status   02/13/2021 8.5 (L) 12.0 - 18.0 g/dL Final     OXYHEMOGLOBIN   Date Value Ref Range Status   02/13/2021 96.0 % Final     CARBOXYHEMOGLOBIN   Date Value Ref Range Status   02/13/2021 2.3 0.0 - 2.5 % Final     MET-HEMOGLOBIN   Date Value Ref Range Status   02/13/2021 1.1 % Final     BASE EXCESS   Date Value Ref Range Status   02/08/2021 3.6 (H) -3.0 - 3.0 mmol/L Final     BASE EXCESS (ARTERIAL)   Date Value Ref Range Status   02/13/2021 1.5 (H) 0.0 - 1.0 mmol/L Final     BASE DEFICIT   Date Value Ref Range Status   02/13/2021 2.0 0.0 - 3.0 mmol/L Final   02/13/2021 4.8 (H) -3.0 - 3.0 mmol/L Final     BICARBONATE (ARTERIAL)   Date Value Ref Range Status   02/13/2021 26.1 (H) 18.0 - 26.0 mmol/L Final     BICARBONATE (VENOUS)   Date Value Ref Range Status   02/13/2021 20.9 (L) 22.0 - 26.0 mmol/L Final     %FIO2 (VENOUS)   Date Value Ref Range Status   02/13/2021 80.0 % Final     Airway:  EndoTracheal Tube Oral;Cuffed  7.5 Lip (Active)   Airway Secure Tape 02/13/21 0003   Measurement from lip (cm) 23 cm 02/13/21 1200

## 2021-02-13 NOTE — Anesthesia Procedure Notes (Addendum)
Central Line Placement    Performed By  Performing provider: Cristine Polio, DO  Authorizing provider: Emiliano Dyer, MD  Patient Location  Pt location: In OR  Consent  written from power of attorney.  Protocol   Patient identity confirmed with ID band, arm band, verbally with patient, hospital-assigned identification number and provided demographic data.  Preprocedure hand washing was performed; sterile field was maintained   Time out performed:N/A was performed to confirm correct patient and procedure.  Ultrasound  Ultrasound used for needle placement, imaging, supervision, and interpretation  Image:  images available in PACS    Procedure   A 9 Fr Cordis and Double lumen central line was inserted into the right Internal Jugular, for central pressure monitoring, multiple IV medications or infusions and vascular access. Patient was placed in  Trendelenburg .  Technique Guidewire, Ultrasound guided, Seldinger and Acquired TEE image of wire in right atrium during line placement used guidewire used and guidewire removed    Number of attempts :1.Site was prepped with chlorhexidine gluconate and isopropyl alcohol, patient was anesthetized, and site local  was used. Line secured by Transparent dressing and line sutured at  cms. Total catheter length of               Post Placement  blood return through all ports, free fluid flow, good waveform, Dark color and Lumen(s) flush without resistance  Complications(if any).  none  Comments  Comments  US guided vascular access without complication.  Images available in [place], DO 02/13/2021  Department of Anesthesiology

## 2021-02-13 NOTE — Anesthesia Postprocedure Evaluation (Signed)
Anesthesia Post Op Evaluation    Patient: [name] [name]  Procedure(s) with comments:  PERFUSION CHARGE/STBY/CARDIOPULMONARY BYPASS  AUTOLOGOUS BLOOD CONSERVATION SETUP  AUTOLOGOUS BLOOD CONSERVATION, MONITORING PER HOUR  REPLACEMENT VALVE TRICUSPID - Epic tissue valve (55m)  STERNOTOMY MEDIASTINAL  CLOSURE PATENT FORAMEN OVALE    Last Vitals:Temperature: 37.3 C (99.1 F) (02/13/21 1330)  Heart Rate: 84 (02/13/21 1330)  BP (Non-Invasive): 124/86 (02/13/21 0500)  Respiratory Rate: 16 (02/13/21 1330)  SpO2: 100 % (02/13/21 10000000    No complications documented.      Patient location during evaluation: ICU       Patient participation: complete - patient cannot participate  Level of consciousness: sedated    Pain management: adequate    Anesthetic complications: no  Cardiovascular status: acceptable  Respiratory status: acceptable, ETT, intubated and ventilator  Hydration status: acceptable  Patient post-procedure temperature: Pt Normothermic

## 2021-02-13 NOTE — Ancillary Notes (Signed)
Chart reviewed.  Patient is s/p TVR and PFO closure with activity orders to begin 1000 on 02/14/21. Cardiac rehab services will begin when patient is hemodynamically stable for ambulation.  Will continue to monitor patient for changes.    Miller [place]gington A. Elvis Coil, [place], EP-C  Cardiac Rehabilitation  [phone]

## 2021-02-13 NOTE — Brief Op Note (Signed)
[place] [facility]                                                     BRIEF OPERATIVE NOTE    Patient Name: Kaysi, Joson Number: [id number]  Date of Service: 02/13/2021   Date of Birth: March 18, 1986    All elements must be documented.    Pre-Operative Diagnosis: Tricuspid valve endocarditis, moderate TR, MRSA bacteremia, IVDU, PFO, aspiration pneumonia, AKI on HD, anemia, thrombocytopenia   Post-Operative Diagnosis: same  Procedure(s)/Description:    Median sternotomy  Right common femoral vein cannulation under ultrasound guidance  On pump beating heart TVR (Epic 33)  PFO colsure  Chest tubes x2 M  Temporary pacing wires 2A 2V    Findings/Complexity (inherent to the procedure performed):   PFO with left to right shunt   Moderate TR  Vegetation on anterior leaflet with complete destruction of the leaflet  At the end of the procedure, sinus rhythm with normal RV/LV function, no PFO, no PVL, mean gradient 2     Attending Surgeon: Dr. Renita Papa  Assistant(s): Tommi Emery, MD; Dixon Boos, MD; Shirlee More, PA-C, Alexa Szelong, PA-C    Anesthesia Type: General  Estimated Blood Loss:  less than 100 ml  Blood Given: 380 cc cell saver  Fluids Given: per anesthesia records  Complications (not routinely expected or not inherent to difficulty/nature of procedure):none  Characteristic Event (routinely expected or inherent to the difficulty/nature of the procedure): none  Did the use of current and/or prior Anticoagulants impact the outcome of the case? N\A  Wound Class: Clean Wound: Uninfected operative wounds in which no inflammation occurred    Tubes: Chest Tube  Drains: None  Specimens/ Cultures: native TV vegetations for pathology and culture   Implants: Bioprosthetic TV           Disposition: ICU - intubated and hemodynamically stable.  Condition: stable    Tommi Emery, MD

## 2021-02-13 NOTE — Nurses Notes (Signed)
Patient admitted to CVICU 19 from OR S/P TVR and PFO closure. Patient received by RN, connected to monitor. Patient arrived intubated, connected to ventilator by RT. Patient arrived on continuous insulin and norepinephrine infusions, see MAR. See flowsheets for assessment.  See results section for FS, labs, and CXR.  Will continue to assess and monitor.     Restraint Initiation    Patient assessed and found to have the following condition: emergence delirium,     Least restrictive alternatives attempted: Considered 1:1 sit, Family called, Increase patient observation, moved closer to the nurses station, bed-exit alarm/motion detector, call light accessible, concealed tubes/lines, decreased/removed stimulus, frequent orientation, involved patient in conversation, provided familiar/personal items, reoriented to person, place, time, and treatment and encouraged relaxation techniques.    Patient is exhibiting the following behaviors: uncooperative, agitated and kicking.    The reason for application of restraint: patient attempting to remove artificial airway, patient attempting to pull at IV lines, patient attempting to pull at invasive medical tubes, patient is attempting to get out of bed and intermittent break through RASS> -2     Lonie Peak, RN  02/13/2021, 12:26      The restraint was applied to facilitate medical/surgical treatment to ensure safety.    The patient will continue to be evaluated and assessments documented on the flowsheet to ensure that the patient is released from the restraint at the earliest possible time.

## 2021-02-14 ENCOUNTER — Inpatient Hospital Stay (HOSPITAL_COMMUNITY): Payer: Medicaid Other

## 2021-02-14 DIAGNOSIS — R6521 Severe sepsis with septic shock: Secondary | ICD-10-CM

## 2021-02-14 DIAGNOSIS — A4902 Methicillin resistant Staphylococcus aureus infection, unspecified site: Secondary | ICD-10-CM

## 2021-02-14 DIAGNOSIS — R0989 Other specified symptoms and signs involving the circulatory and respiratory systems: Secondary | ICD-10-CM

## 2021-02-14 DIAGNOSIS — I517 Cardiomegaly: Secondary | ICD-10-CM

## 2021-02-14 DIAGNOSIS — Z8619 Personal history of other infectious and parasitic diseases: Secondary | ICD-10-CM

## 2021-02-14 DIAGNOSIS — Z452 Encounter for adjustment and management of vascular access device: Secondary | ICD-10-CM

## 2021-02-14 DIAGNOSIS — J9811 Atelectasis: Secondary | ICD-10-CM

## 2021-02-14 DIAGNOSIS — F1990 Other psychoactive substance use, unspecified, uncomplicated: Secondary | ICD-10-CM

## 2021-02-14 DIAGNOSIS — R739 Hyperglycemia, unspecified: Secondary | ICD-10-CM

## 2021-02-14 DIAGNOSIS — J90 Pleural effusion, not elsewhere classified: Secondary | ICD-10-CM

## 2021-02-14 DIAGNOSIS — A4102 Sepsis due to Methicillin resistant Staphylococcus aureus: Principal | ICD-10-CM

## 2021-02-14 DIAGNOSIS — R918 Other nonspecific abnormal finding of lung field: Secondary | ICD-10-CM

## 2021-02-14 LAB — CBC WITH DIFF
BASOPHIL #: <0.1 10*3/uL (ref <=0.20)
BASOPHIL %: 1 %
EOSINOPHIL #: <0.1 10*3/uL (ref <=0.50)
EOSINOPHIL %: 0 %
HCT: 28.2 % — ABNORMAL LOW (ref 34.8–46.0)
HGB: 9.1 g/dL — ABNORMAL LOW (ref 11.5–16.0)
IMMATURE GRANULOCYTE #: 0.24 10*3/uL — ABNORMAL HIGH (ref <0.10)
IMMATURE GRANULOCYTE %: 2 % — ABNORMAL HIGH (ref 0–1)
LYMPHOCYTE #: 1.21 10*3/uL (ref 1.00–4.80)
LYMPHOCYTE %: 11 %
MCH: 26.5 pg (ref 26.0–32.0)
MCHC: 32.3 g/dL (ref 31.0–35.5)
MCV: 82 fL (ref 78.0–100.0)
MONOCYTE #: 1.15 10*3/uL — ABNORMAL HIGH (ref 0.20–1.10)
MONOCYTE %: 11 %
MPV: 10.4 fL (ref 8.7–12.5)
NEUTROPHIL #: 8.1 10*3/uL — ABNORMAL HIGH (ref 1.50–7.70)
NEUTROPHIL %: 75 %
PLATELETS: 163 10*3/uL (ref 150–400)
RBC: 3.44 10*6/uL — ABNORMAL LOW (ref 3.85–5.22)
RDW-CV: 20.6 % — ABNORMAL HIGH (ref 11.5–15.5)
WBC: 10.8 10*3/uL (ref 3.7–11.0)

## 2021-02-14 LAB — BPAM PACKED CELL ORDER
UNIT DIVISION: 0
UNIT DIVISION: 0
UNIT DIVISION: 0
UNIT DIVISION: 0

## 2021-02-14 LAB — BASIC METABOLIC PANEL
ANION GAP: 13 mmol/L (ref 4–13)
ANION GAP: 7 mmol/L (ref 4–13)
BUN/CREA RATIO: 10 (ref 6–22)
BUN/CREA RATIO: 8 (ref 6–22)
BUN: 36 mg/dL — ABNORMAL HIGH (ref 8–25)
BUN: 11 mg/dL (ref 8–25)
CALCIUM: 7.7 mg/dL — ABNORMAL LOW (ref 8.5–10.0)
CALCIUM: 8.2 mg/dL — ABNORMAL LOW (ref 8.5–10.0)
CHLORIDE: 96 mmol/L (ref 96–111)
CHLORIDE: 99 mmol/L (ref 96–111)
CO2 TOTAL: 20 mmol/L — ABNORMAL LOW (ref 22–30)
CO2 TOTAL: 26 mmol/L (ref 22–30)
CREATININE: 3.52 mg/dL — ABNORMAL HIGH (ref 0.60–1.05)
CREATININE: 1.44 mg/dL — ABNORMAL HIGH (ref 0.60–1.05)
ESTIMATED GFR: 17 mL/min/BSA — ABNORMAL LOW (ref >=60)
ESTIMATED GFR: 49 mL/min/BSA — ABNORMAL LOW (ref >=60)
GLUCOSE: 126 mg/dL — ABNORMAL HIGH (ref 65–125)
GLUCOSE: 103 mg/dL (ref 65–125)
POTASSIUM: 4.5 mmol/L (ref 3.5–5.1)
POTASSIUM: 3.4 mmol/L — ABNORMAL LOW (ref 3.5–5.1)
SODIUM: 129 mmol/L — ABNORMAL LOW (ref 136–145)
SODIUM: 132 mmol/L — ABNORMAL LOW (ref 136–145)

## 2021-02-14 LAB — TYPE AND CROSS RED CELLS - UNITS
ABO/RH(D): O POS
UNITS ORDERED: 5

## 2021-02-14 LAB — PRODUCT: ANTITHROMBIN III: UNIT DIVISION: 0

## 2021-02-14 LAB — IONIZED CALCIUM
IONIZED CALCIUM: 1.07 mmol/L — ABNORMAL LOW (ref 1.10–1.35)
PH (VENOUS): 7.46 — ABNORMAL HIGH (ref 7.31–7.41)

## 2021-02-14 LAB — ADULT ROUTINE BLOOD CULTURE, SET OF 2 BOTTLES (BACTERIA AND YEAST)

## 2021-02-14 LAB — VANCOMYCIN, RANDOM: VANCOMYCIN RANDOM: 32.7 ug/mL (ref 30.0–40.0)

## 2021-02-14 LAB — PTT (PARTIAL THROMBOPLASTIN TIME)
APTT: 26.5 seconds (ref 24.2–37.5)
APTT: 27 seconds (ref 24.2–37.5)

## 2021-02-14 LAB — MAGNESIUM
MAGNESIUM: 2.9 mg/dL — ABNORMAL HIGH (ref 1.8–2.6)
MAGNESIUM: 2 mg/dL (ref 1.8–2.6)

## 2021-02-14 LAB — POC BLOOD GLUCOSE (RESULTS)
GLUCOSE, POC: 172 mg/dl — ABNORMAL HIGH (ref 70–105)
GLUCOSE, POC: 101 mg/dl (ref 70–105)

## 2021-02-14 LAB — LIGHT GREEN TOP TUBE

## 2021-02-14 LAB — PHOSPHORUS
PHOSPHORUS: 6.8 mg/dL — ABNORMAL HIGH (ref 2.4–4.7)
PHOSPHORUS: 2.3 mg/dL — ABNORMAL LOW (ref 2.4–4.7)

## 2021-02-14 MED ORDER — OXYCODONE 10 MG/0.5 ML ORAL SYRINGE (FOR ORAL USE ONLY)
10.0000 mg | INJECTION | ORAL | Status: DC | PRN
Start: 2021-02-14 — End: 2021-02-16
  Administered 2021-02-14 – 2021-02-16 (×4): 10 mg via ORAL
  Filled 2021-02-14 (×4): qty 0.5

## 2021-02-14 MED ORDER — VANCOMYCIN 10 GRAM INTRAVENOUS SOLUTION
250.0000 mg | Freq: Once | INTRAVENOUS | Status: AC
Start: 2021-02-14 — End: 2021-02-14
  Administered 2021-02-14: 0 mg via INTRAVENOUS
  Administered 2021-02-14: 250 mg via INTRAVENOUS
  Filled 2021-02-14: qty 2.5

## 2021-02-14 MED ORDER — SODIUM CHLORIDE 0.9 % INTRAVENOUS SOLUTION
1.0000 g | INJECTION | INTRAVENOUS | Status: AC | PRN
Start: 2021-02-14 — End: 2021-02-14
  Administered 2021-02-14: 1 g via INTRAVENOUS
  Administered 2021-02-14: 0 g via INTRAVENOUS
  Filled 2021-02-14: qty 10

## 2021-02-14 MED ORDER — MELATONIN 3 MG TABLET
3.0000 mg | ORAL_TABLET | Freq: Every evening | ORAL | Status: DC
Start: 2021-02-14 — End: 2021-02-21
  Administered 2021-02-14 – 2021-02-20 (×7): 3 mg via ORAL
  Filled 2021-02-14 (×7): qty 1

## 2021-02-14 MED ORDER — POTASSIUM CHLORIDE 2 MEQ/ML INTRAVENOUS SOLUTION
INTRAVENOUS | Status: AC
Start: 2021-02-14 — End: 2021-02-14

## 2021-02-14 MED ORDER — PANTOPRAZOLE 40 MG TABLET,DELAYED RELEASE
40.0000 mg | DELAYED_RELEASE_TABLET | Freq: Every morning | ORAL | Status: DC
Start: 2021-02-14 — End: 2021-02-22
  Administered 2021-02-14 – 2021-02-22 (×9): 40 mg via ORAL
  Filled 2021-02-14 (×9): qty 1

## 2021-02-14 MED ORDER — LIDOCAINE 3.6 %-MENTHOL 1.25 % TOPICAL PATCH
1.0000 | MEDICATED_PATCH | Freq: Every day | CUTANEOUS | Status: DC
Start: 2021-02-14 — End: 2021-03-02
  Administered 2021-02-14 – 2021-02-26 (×13): 1 via TRANSDERMAL
  Administered 2021-02-27 – 2021-03-02 (×5): 0 via TRANSDERMAL
  Filled 2021-02-14 (×17): qty 1

## 2021-02-14 MED ORDER — OXYCODONE 10 MG/0.5 ML ORAL SYRINGE (FOR ORAL USE ONLY)
15.0000 mg | INJECTION | ORAL | Status: DC | PRN
Start: 2021-02-14 — End: 2021-02-16
  Administered 2021-02-14 – 2021-02-16 (×6): 15 mg via ORAL
  Filled 2021-02-14 (×6): qty 1

## 2021-02-14 MED ORDER — SODIUM CHLORIDE 0.9 % INTRAVENOUS SOLUTION
1.0000 g | INJECTION | INTRAVENOUS | Status: AC | PRN
Start: 2021-02-14 — End: 2021-02-14
  Administered 2021-02-14: 1 g via INTRAVENOUS
  Administered 2021-02-14 (×2): 0 g via INTRAVENOUS
  Filled 2021-02-14: qty 10

## 2021-02-14 MED ORDER — HEPARIN (PORCINE) 25,000 UNIT/250 ML IN 0.45 % SODIUM CHLORIDE IV SOLN
12.0000 [IU]/kg/h | INTRAVENOUS | Status: DC
Start: 2021-02-14 — End: 2021-02-18
  Administered 2021-02-14: 16 [IU]/kg/h via INTRAVENOUS
  Administered 2021-02-14 (×6): 12 [IU]/kg/h via INTRAVENOUS
  Administered 2021-02-14: 16 [IU]/kg/h via INTRAVENOUS
  Administered 2021-02-14: 12 [IU]/kg/h via INTRAVENOUS
  Administered 2021-02-14: 16 [IU]/kg/h via INTRAVENOUS
  Administered 2021-02-15: 20 [IU]/kg/h via INTRAVENOUS
  Administered 2021-02-15: 30 [IU]/kg/h via INTRAVENOUS
  Administered 2021-02-15: 20 [IU]/kg/h via INTRAVENOUS
  Administered 2021-02-15: 28 [IU]/kg/h via INTRAVENOUS
  Administered 2021-02-15: 24 [IU]/kg/h via INTRAVENOUS
  Administered 2021-02-15: 28 [IU]/kg/h via INTRAVENOUS
  Administered 2021-02-16: 32 [IU]/kg/h via INTRAVENOUS
  Administered 2021-02-16: 34 [IU]/kg/h via INTRAVENOUS
  Administered 2021-02-16 (×2): 32 [IU]/kg/h via INTRAVENOUS
  Administered 2021-02-16: 30 [IU]/kg/h via INTRAVENOUS
  Administered 2021-02-16: 0 [IU]/kg/h via INTRAVENOUS
  Administered 2021-02-16: 32 [IU]/kg/h via INTRAVENOUS
  Administered 2021-02-17: 36 [IU]/kg/h via INTRAVENOUS
  Administered 2021-02-17: 35 [IU]/kg/h via INTRAVENOUS
  Administered 2021-02-17: 36 [IU]/kg/h via INTRAVENOUS
  Administered 2021-02-17: 34 [IU]/kg/h via INTRAVENOUS
  Administered 2021-02-17: 0 [IU]/kg/h via INTRAVENOUS
  Administered 2021-02-17 (×2): 35 [IU]/kg/h via INTRAVENOUS
  Administered 2021-02-18: 0 [IU]/kg/h via INTRAVENOUS
  Administered 2021-02-18: 33 [IU]/kg/h via INTRAVENOUS
  Filled 2021-02-14 (×6): qty 250

## 2021-02-14 MED ORDER — POTASSIUM CHLORIDE ER 20 MEQ TABLET,EXTENDED RELEASE(PART/CRYST)
20.0000 meq | ORAL_TABLET | Freq: Once | ORAL | Status: DC
Start: 2021-02-14 — End: 2021-02-14

## 2021-02-14 MED ORDER — SODIUM CITRATE 4 % (3 ML) INTRA-CATHETER INJECTION SYRINGE
2.0000 | INJECTION | Status: AC
Start: 2021-02-14 — End: 2021-02-14
  Administered 2021-02-14 (×2): 2
  Filled 2021-02-14 (×2): qty 6

## 2021-02-14 MED ORDER — POTASSIUM CHLORIDE ER 20 MEQ TABLET,EXTENDED RELEASE(PART/CRYST)
30.0000 meq | ORAL_TABLET | Freq: Once | ORAL | Status: AC | PRN
Start: 2021-02-14 — End: 2021-02-14
  Administered 2021-02-14: 30 meq via ORAL
  Filled 2021-02-14: qty 1

## 2021-02-14 NOTE — Care Plan (Signed)
[facility]  Speech Therapy Progress Note    Patient Name: [name] [name]  Date of Birth: May 09, 1986  Weight:  66.5 kg (146 lb 9.7 oz)  Room/Bed: 19/A  Payor: [place] MEDICAID / Plan: [place] MEDICAID / Product Type: Medicaid [facility] /      Date/Time of Admission: 02/05/2021 12:34 AM  Admitting Diagnosis:  Endocarditis [I38]    Assessment:  This patient presents with a clinically functional oropharyngeal swallow without overt signs or symptoms of aspiration administered PO trials of thin liquids and regular solids. She does exhibit signs of difficulty masticating, but recollects and swallows bolus promptly. Patient's limited PO intake not suspected to be related to dysphagia. Recommend patient initiate a diet of IDDSI Level 0: thin liquids and mechanical soft solids.       JUSTIFICATION OF DISCHARGE RECOMMENDATION: to be determined    Plan: Continue ST services 1-3x/week  Recommendations: Mechanical soft diet and thin liquids  Results & Recommendations Discussed With:Patient, Nurse, and Family   Continue to follow patient according to established plan of care.  The risks/benefits of therapy have been discussed with the patient/caregiver and he/she is in agreement with the established plan of care.       Subjective: Patient seen sitting upright in chair, family member at bedside. Communicating with family readily, slight slurred speech and intermittent out of context comments (Do you know how to fly an airplane?). Agreeable to evaluation.   Flowsheet Info:     02/14/21 1036   Therapist Pager   SLP Pager [place]   Rehab Session   Document Type therapy progress note (daily note)   Total SLP Minutes: 16     Objective:  Oriented to self and situation, knows month is august   Consistencies observed:     Thin liquids: 4 oz thin liquids   Dry solids: 2 crackers  S/s aspiration observed:    Thin liquids: none   Dry solids: none  Signs of pharyngeal inefficiency:   Dry solids:  slow/prolonged mastication 2/2 newly edentulous    Therapist:  Clifton Custard, ST   Pager #: [phone]  Phone #: [phone]  Treatment Time: 16 minutes

## 2021-02-14 NOTE — Care Plan (Signed)
[place]  Occupational Therapy Progress Note    Patient Name: [name] [name]  Date of Birth: 1985/09/07  Height:  165.1 cm ('5\' 5"'$ )  Weight:  66.5 kg (146 lb 9.7 oz)  Room/Bed: 19/A  Payor: Alecia [place] MEDICAID / Plan: [employer] [place] MEDICAID / Product Type: Medicaid [place] /     Assessment:    Patient tolerated tx session well this date. Patient requires Min assist x2 for safe transfers and mobility, patient is Dep to don socks. Patient is expected to be able to return home with 24/7 assist when medically appropriate.      Discharge Needs:   Equipment Recommendation: to be determined    Discharge Disposition:  home with 24/7 assistance     JUSTIFICATION OF DISCHARGE RECOMMENDATION   Based on current diagnosis, functional performance prior to admission, and current functional performance, this patient requires continued OT services in home with 24/7 assistance in order to achieve significant functional improvements.    Plan:   Continue to follow patient according to established plan of care.  The risks/benefits of therapy have been discussed with the patient/caregiver and he/she is in agreement with the established plan of care.     Subjective & Objective:      02/14/21 1026   Therapist Pager   OT Assigned/ Pager # [phone]   Rehab Session   Document Type therapy progress note (daily note)   Total OT Minutes: 23   Patient Effort good   Symptoms Noted During/After Treatment fatigue   General Information   Patient Profile Reviewed yes   Onset of Illness/Injury or Date of Surgery 02/05/21   Pertinent History of Current Functional Problem POD 1: s/p TVR, PFO closure,   Medical Lines PIV Line;Telemetry;External Pacer;Central Line   Respiratory Status room air   Existing Precautions/Restrictions full code;fall precautions   Pre Treatment Status   Pre Treatment Patient Status Patient sitting in bedside chair or w/c;Call light within reach;Telephone within reach;Sitter select activated   Support  Present Pre Treatment  Nurse present   Communication Pre Treatment  Nurse   Mutuality/Individual Preferences   Individualized Care Needs OOB with Ax2   Vital Signs   Pre-Treatment Heart Rate (beats/min) 99   Post-treatment Heart Rate (beats/min) 101   Pre Treatment BP 117/58   Post Treatment BP 123/73   Pre SpO2 (%) 97   O2 Delivery Pre Treatment supplemental O2   Post SpO2 (%) 97   O2 Delivery Post Treatment supplemental O2   Pain Assessment   Additional Documentation   (Indicated incisional pain)   Coping/Psychosocial   Observed Emotional State calm;cooperative   Coping/Psychosocial Response Interventions   Plan Of Care Reviewed With patient   Cognitive Assessment/Interventions   Behavior/Mood Observations alert;cooperative;flat affect   Attention WNL/WFL   Follows Commands follows one step commands   Mobility Assessment/Training   Mobility Comment Patient transitioned from STS from bedside chair with Min assist x2. Patient able to ambulate ~50' with CG assist x2 and IV pole for support with close chair follow. Following seated rest break, patient able to ambulate another 56' with Min assist x2. Patient returned to room via bedside chair.   Transfer Assessment/Treatment   Sit-Stand Independence minimum assist (75% patient effort);verbal cues required;2 person assist required   Stand-Sit Independence minimum assist (75% patient effort);verbal cues required;2 person assist required   Sit-Stand-Sit, Assist Device side by side   Transfer Impairments balance impaired;endurance;postural control impaired;strength decreased;coordination impaired   Lower Body  Dressing Assessment/Training   Position sitting   DRESSING ASSESSED Don Socks   Independence Level  dependent (less than 25% patient effort)   Impairments pain;flexibility decreased   Balance Skill Training   Comment with side by side   Sitting Balance: Static fair + balance   Sitting, Dynamic (Balance) fair balance   Sit-to-Stand Balance fair balance   Standing  Balance: Static fair balance   Standing Balance: Dynamic fair - balance   Systems Impairment Contributing to Balance Disturbance musculoskeletal   Post Treatment Status   Post Treatment Patient Status Patient sitting in bedside chair or w/c;Call light within reach;Telephone within reach   Support Present Post Treatment  None   Communication Post Treatement Nurse   Clinical Impression   Functional Level at Time of Session Patient tolerated tx session well this date. Patient requires Min assist x2 for safe transfers and mobility, patient is Dep to don socks. Patient is expected to be able to return home with 24/7 assist when medically appropriate.   Anticipated Discharge Disposition home with 24/7 assistance   Highest level of Mobility score   Exercise/Activity Level Performed 7- Walked 25 feet or more       Therapist:   Trellis Moment, OT  Pager #: [phone]

## 2021-02-14 NOTE — Nurses Notes (Signed)
Hemodialysis completed at bedside, tolerated well. VSS. Total UF 2000 ml. Accessed left niagra with good blood return, dressing changed, WNL.

## 2021-02-14 NOTE — Respiratory Therapy (Signed)
Pt currently up in chair on RA   Pt encouraged to deep breath and cough   Will continue to monitor pt airway and oxygen requirements
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
## 2021-02-14 NOTE — Consults (Signed)
INFECTIOUS DISEASE FOLLOW UP CONSULTATION    Patient Name: [name] [name] Number: [id number]  Date of Service: 02/14/2021  Date of Birth: 1985/11/22    Hospital Day:  LOS: 9 days     Reason for Consultation: MRSA native tricuspid valve endocarditis     Subjective: [name] [name] reports having pain all over but especially in her chest from surgery. Reports fever this AM of 101.6. Reports she has been tolerating antibiotics. States that she wants her mom here today as well.     Objective:  Physical Exam:  Current Vitals: BP 124/86   Pulse 100   Temp (!) 38.7 C (101.66 F)   Resp (!) 23   Ht 1.651 m (_0 )   Wt 66.5 kg (146 lb 9.7 oz)   SpO2 (!) 86%   BMI 24.40 kg/m       Vitals in last 24 hours: Temp  Avg: 37.1 C (98.8 F)  Min: 36.5 C (97.7 F)  Max: 38.7 C (101.66 F)  Pulse  Avg: 85.6  Min: 79  Max: 100  Resp  Avg: 19.1  Min: 12  Max: 39  SpO2  Avg: 98.1 %  Min: 73 %  Max: 100 %  General: No acute distress.  Chronically-ill appearing. Anxious   Eyes: Pupils equal round and reactive bilaterally.  No conjunctival injection or scleral icterus.  ENT: Face symmetrical.  Mucous membranes pink and moist.  Edentulous.   Neck: Supple and symmetrical.   Lungs: Coarse to auscultation bilaterally.  Labored respirations.  Cardiovascular: Regular rate and rhythm.  Chest tubes x 2 in place with serosanguinous ouput. Pacing wires in place. Sternal incision site appears clean, intact   Abdomen: Normoactive bowel sounds.  Nondistended, soft, and nontender to palpation..  Extremities: No joint erythema or swelling.  1+ edema.   Skin: Warm and dry.  Multiple healing lesions to hands and feet.  Neurologic: Alert and oriented X 3.  Moves all extremities without focal deficit.  Psychiatric: Appropriate affect and behavior.  Intact memory.  Fluent speech.     Inpatient Medications:  acetaminophen (TYLENOL) tablet, 975 mg, Oral, Q6H  [START ON 02/15/2021] acetaminophen (TYLENOL) tablet, 650 mg, Oral, Q4H PRN  ascorbic acid  (VITAMIN C) tablet, 500 mg, Oral, 2x/day  aspirin chewable tablet 81 mg, 81 mg, Oral, Daily  B complex-vitamin C-folic acid (NEPHROCAPS) capsule, 1 Capsule, Oral, Daily  bisacodyl (DULCOLAX) rectal suppository, 10 mg, Rectal, Daily PRN  calcium chloride 1 g in NS 50 mL IVPB, 1 g, Intravenous, Q1H PRN  [Held by provider] dexmedeTOMIDine (PRECEDEX) 400 mcg in NS 100 mL (tot vol) infusion, 0.2 mcg/kg/hr (Adjusted), Intravenous, Continuous  docusate sodium (COLACE) capsule, 100 mg, Oral, 2x/day  heparin 5,000 unit/mL injection, 5,000 Units, Subcutaneous, Q8HRS  HYDROmorphone (DILAUDID) 1 mg/mL injection, 0.2 mg, Intravenous, Q4H PRN  ipratropium (ATROVENT) 0.02% nebulizer solution, 0.5 mg, Nebulization, Q4H PRN  levalbuterol (XOPENEX) 1.25 mg/ 0.5 mL nebulizer solution, 1.25 mg, Nebulization, Q4H PRN  magnesium hydroxide (MILK OF MAGNESIA) 463m per 526moral liquid, 30 mL, Oral, Daily PRN  miconazole nitrate (SECURA) 2% topical cream, , Apply Topically, 2x/day  mupirocin (BACTROBAN) 2% topical ointment, , Apply Topically, 2x/day  NS flush syringe, 10-40 mL, Intravenous, Q8HRS  NS flush syringe, 20-30 mL, Intravenous, Q1 MIN PRN  NS flush syringe, 2-6 mL, Intracatheter, Q8HRS   And  NS flush syringe, 2-6 mL, Intracatheter, Q1 MIN PRN  ondansetron (ZOFRAN) 2 mg/mL injection, 4 mg, Intravenous, Q8H PRN  oxyCODONE concentrate (ROXICODONE  INTENSOL) 10 mg per 0.5 mL oral liquid, 10 mg, Oral, Q4H PRN   Or  oxyCODONE concentrate (ROXICODONE INTENSOL) 10 mg per 0.5 mL oral liquid, 5 mg, Oral, Q4H PRN  polyethylene glycol (MIRALAX) oral packet, 17 g, Oral, Daily  QUEtiapine (SEROQUEL) tablet, 50 mg, Oral, Daily  QUEtiapine (SEROQUEL) tablet, 100 mg, Oral, NIGHTLY  sodium chloride 3% nebulizer solution, 3 mL, Nebulization, Q6H PRN  sodium citrate 4% (3 mL) injection syringe, 2 Syringe, Intracatheter, Q1H PRN  SSIP insulin lispro 100 units/mL injection, 0-12 Units, Subcutaneous, 4x/day PRN  Vancomycin IV - Pharmacist to Dose per  Protocol, , Does not apply, Daily PRN  Vancomycin IV Intermittent Dosing, , Does not apply, Daily PRN        Current Antimicrobials:  Antibiotics (From admission, onward)      Start     Stop Route Frequency    02/10/21 1900  vancomycin (VANCOCIN) 500 mg in NS 100 mL IVPB         08/27 2353 IV ONCE    02/10/21 1016  Vancomycin IV Intermittent Dosing         -- N/A DAILY PRN    02/06/21 2000  vancomycin (VANCOCIN) 1 g in D5W 200 mL premix IVPB  Status:  Discontinued         08/27 1016 IV EVERY 24 HOURS    02/05/21 0226  Vancomycin IV Intermittent Dosing  Status:  Discontinued         08/23 1608 N/A DAILY PRN             Lines:  Patient Lines/Drains/Airways Status       Active Line / Dialysis Catheter / Dialysis Graft / Drain / Airway / Wound       Name Placement date Placement time Site Days    Peripheral IV Ultrasound guided;Extended dwell catheter Right Cephalic  (lateral side of arm);Forearm 02/12/21  0201  -- 2    Peripheral IV Ultrasound guided;Extended dwell catheter Anterior;Proximal;Right;Upper Arm 02/12/21  0204  -- 2    Arterial Line Left Radial 02/13/21  0823  Radial  less than 1    Central Double Lumen Cordis Right Internal Jugular 02/13/21  0901  -- less than 1    Slic Cath Right Internal Jugular 02/13/21  1200  -- less than 1    Dialysis Catheter Trialysis Catheter 02/12/21  1130  -- 1    Chest Tube Mediastinal 02/13/21  1012  -- less than 1    Chest Tube Mediastinal 02/13/21  1018  -- less than 1    Foley Catheter 02/13/21  0715  -- 1    Wound (Non-Surgical) Anterior;Right Elbow 02/05/21  0045  -- 9    Wound (Non-Surgical) Anterior;Right Finger 1;2;3;4;5 02/10/21  2000  -- 3    Wound (Non-Surgical) Anterior;Left Finger 1;2;3;4;5 02/10/21  2000  -- 3    Wound (Non-Surgical) Buttock 02/11/21  2000  -- 2    Surgical Incision Inner Mouth 02/09/21  --  -- 5    Surgical Incision Mediastinal Chest 02/13/21  --  -- 1    Surgical Incision Right Groin 02/13/21  --  -- 1                     Laboratory  Studies:  CBC Differential   Recent Labs     02/13/21  0115 02/13/21  1207 02/14/21  0500   WBC 10.7 14.9* 10.8   HGB 8.2* 9.3* 9.1*   HCT  25.1* 27.9* 28.2*   PLTCNT 171 129* 163    Recent Labs     02/13/21  0115 02/13/21  1207 02/14/21  0500   PMNS 78 90 75   LYMPHOCYTES  --  1  --    MONOCYTES _0 EOSINOPHIL  --  0  --    BASOPHILS 0  <0.10 0  <0.10 1  <0.10   PMNABS 8.42* 13.41* 8.10*   LYMPHSABS 0.92*  --  1.21   MONOSABS 1.01 1.34* 1.15*   EOSABS <0.10 <0.10 <0.10      BMP LFTs   Recent Labs     02/13/21  1207 02/14/21  0500   SODIUM 136  132* 129*   POTASSIUM 4.3 4.5   CHLORIDE 101  99* 96   CO2 24 20*   BUN 27* 36*   CREATININE 3.08* 3.52*   GLUCOSENF  --  126*   ANIONGAP 11 13   BUNCRRATIO  --  10   GFR 20* 17*   CALCIUM  --  7.7*   MAGNESIUM 3.0* 2.9*   PHOSPHORUS  --  6.8*    No results found for this encounter   CoAgs Blood Gas:   Recent Labs     02/13/21  1207   PROTHROMTME 16.6*   INR 1.43*   APTT 26.9    Recent Labs     02/13/21  0914 02/13/21  0931 02/13/21  1002 02/13/21  1051 02/13/21  1207 02/13/21  1359 02/14/21  0500   FI02 80.0 80 70 100 80 35  --    PH 7.23* 7.27* 7.28* 7.43 7.39 7.41 7.46*   PCO2 54* 51* 53* 39 42 40  --    PO2 53* 354* 134* 121* 344* 114*  --    BICARBONATE 20.9* 22.2 23.4 26.1* 25.2 25.5  --    BASEEXCESS  --   --   --  1.5* 0.3 0.7  --    BASEDEFICIT 4.8* 3.5* 2.0  --   --   --   --        Cardiac Markers Lipid Panel   No results for input(s): TROPONINI, CKMB, MBINDEX, BNP in the last 72 hours. No results found for this encounter   Urine Analysis Other Labs   Recent Labs     02/13/21  1207   GLUCOSE 92    No results found for this encounter    Invalid input(s): PRL     Microbiology:  Hospital Encounter on 02/05/21 (from the past 96 hour(s))   ADULT ROUTINE BLOOD CULTURE, SET OF 2 ADULT BOTTLES (BACTERIA AND YEAST)    Collection Time: 02/11/21  4:40 AM    Specimen: Blood   Culture Result Status    BLOOD CULTURE, ROUTINE Abnormal Stain (AA) Final    BLOOD CULTURE,  ROUTINE Methicillin Resistant Staphylococcus aureus (A) Final    GRAM STAIN Gram Positive Cocci/Clusters (A) Final    Narrative    For susceptibility, see previous report.     ADULT ROUTINE BLOOD CULTURE, SET OF 2 ADULT BOTTLES (BACTERIA AND YEAST)    Collection Time: 02/11/21  4:40 AM    Specimen: Blood   Culture Result Status    BLOOD CULTURE, ROUTINE Abnormal Stain (AA) Final    BLOOD CULTURE, ROUTINE Methicillin Resistant Staphylococcus aureus (A) Final    GRAM STAIN Gram Positive Cocci/Clusters (A) Final    Narrative    For susceptibility, see previous report.     ADULT  ROUTINE BLOOD CULTURE, SET OF 2 ADULT BOTTLES (BACTERIA AND YEAST)    Collection Time: 02/13/21  1:15 AM    Specimen: Blood   Culture Result Status    BLOOD CULTURE, ROUTINE Abnormal Stain (AA) Preliminary    GRAM STAIN Gram Positive Cocci/Clusters (A) Preliminary    GRAM STAIN Gram Positive Cocci/Clusters (A) Preliminary   ADULT ROUTINE BLOOD CULTURE, SET OF 2 ADULT BOTTLES (BACTERIA AND YEAST)    Collection Time: 02/13/21  1:15 AM    Specimen: Blood   Culture Result Status    BLOOD CULTURE, ROUTINE Abnormal Stain (AA) Preliminary    GRAM STAIN Gram Positive Cocci/Clusters (A) Preliminary   OR CULTURE(AEROBIC AND GRAM STAIN)    Collection Time: 02/13/21  9:14 AM    Specimen: Tissue   Culture Result Status    GRAM STAIN 3+ Several PMNs (A) Preliminary    GRAM STAIN 4+ Many Gram Positive Cocci (A) Preliminary   BRONCHOALVEOLAR LAVAGE (BAL) QUANTITATIVE CULTURE/GRAM STAIN    Collection Time: 02/13/21  3:37 PM    Specimen: Bronchoalveolar Lavage; Other   Culture Result Status    GRAM STAIN 3+ Several PMNs Preliminary    GRAM STAIN 2+ Few Gram Positive Cocci/Clusters Preliminary       Hospital Encounter on 02/05/21 (from the past 24 hour(s))   OR CULTURE(AEROBIC AND GRAM STAIN)    Collection Time: 02/13/21  9:14 AM    Specimen: Tissue   Culture Result Status    GRAM STAIN 3+ Several PMNs (A) Preliminary    GRAM STAIN 4+ Many Gram Positive Cocci (A)  Preliminary   BRONCHOALVEOLAR LAVAGE (BAL) QUANTITATIVE CULTURE/GRAM STAIN    Collection Time: 02/13/21  3:37 PM    Specimen: Bronchoalveolar Lavage; Other   Culture Result Status    GRAM STAIN 3+ Several PMNs Preliminary    GRAM STAIN 2+ Few Gram Positive Cocci/Clusters Preliminary       Imaging Studies:  Results for orders placed or performed during the hospital encounter of 02/05/21 (from the past 72 hour(s))   US GUIDED ARTERIAL ACCESS - ANESTHESIA     Status: None    Narrative    Please refer to US guided access images.   US GUIDED VENOUS ACCESS - ANESTHESIA     Status: None    Narrative    Please refer to US guided access images.   XR CHEST AP MOBILE     Status: None    Narrative    [name] [name]  Female, 35 years old.    XR AP MOBILE CHEST performed on 02/13/2021 1:43 PM.    REASON FOR EXAM:  Immediate Post-Op Exam - evaluation for effusion    TECHNIQUE: 1 views/1 images submitted for interpretation.    COMPARISON:  02/11/2021    FINDINGS:    The heart is upper limits in size. Pulmonary vasculature is within normal limits.   Endotracheal tube tip lies approximately 2 cm above the carina. Nasogastric tube extends below the left hemidiaphragm. Dual lumen right internal jugular vein central catheter tip is projected over the middle third superior vena cava.  There is no pneumothorax. Cavitary mass right lower lung zone and left suprahilar unchanged. Tiny right effusion and atelectasis.  Bone density is borderline. Disc desiccation thoracic spine.     Impression    Tiny right pleural effusion. Postoperative changes.Interval median sternotomy and placement of nasogastric tube, endotracheal tube and replaced right central line.         Results for orders placed or  performed during the hospital encounter of 02/05/21 (from the past 24 hour(s))   XR CHEST AP MOBILE     Status: None    Narrative    [name] [name]  Female, 35 years old.    XR AP MOBILE CHEST performed on 02/13/2021 1:43 PM.    REASON FOR EXAM:  Immediate  Post-Op Exam - evaluation for effusion    TECHNIQUE: 1 views/1 images submitted for interpretation.    COMPARISON:  02/11/2021    FINDINGS:    The heart is upper limits in size. Pulmonary vasculature is within normal limits.   Endotracheal tube tip lies approximately 2 cm above the carina. Nasogastric tube extends below the left hemidiaphragm. Dual lumen right internal jugular vein central catheter tip is projected over the middle third superior vena cava.  There is no pneumothorax. Cavitary mass right lower lung zone and left suprahilar unchanged. Tiny right effusion and atelectasis.  Bone density is borderline. Disc desiccation thoracic spine.     Impression    Tiny right pleural effusion. Postoperative changes.Interval median sternotomy and placement of nasogastric tube, endotracheal tube and replaced right central line.         Assessment:    This is a 35 y.o. female with a history of tobacco use, cleared hepatitis-C infection, nephrolithiasis and active injection drug use who was transferred to [facility] from [facility] on February 05, 2021 with MRSA native tricuspid valve endocarditis.  She presented to [facility] on January 31, 2021 with altered mental status, and was found to be in renal failure.  She was intubated and started on hemodialysis.  Blood and sputum cultures that grew MRSA.  On transfer to [place], a transthoracic echocardiogram confirmed a tricuspid valve vegetation.  CT of the chest shows bilateral airspace opacities some of which are cavitary likely representing septic emboli.  Blood cultures have remained persistently positive for MRSA.  She is now on intermittent HD.  MRI's of the spine without contrast showed no evidence of discitis or epidural abscess. She is s/p TV replacement and primary closure of PFO. OR cultures positive for gram positive cocci as well. Repeated blood cultures pending from this morning,      Recommendations:    - Continue pharmacy to dose IV  vancomycin per protocol x 6 weeks from 02/13/21 if blood cultures remain negative. Today is Day# 1 out of 42.   - Agree with repeated blood cultures every 48 hours until negative.  - Patient will transition to MH-7 once she is stable, Will consider CoPAT therapy after 2-4 weeks of IV antibiotics.    - Please continue to follow CBC with differential, BUN, creatinine, hepatic function panel, and CRP while the patient remains on antimicrobial therapy.    ID will following.  Please call or page the Infectious Diseases Service with any questions regarding this patient.    I independently of the faculty provider spent a total of (20) minutes in direct/indirect care of this patient including initial evaluation, review of laboratory, radiology, diagnostic studies, review of medical record, order entry and coordination of care.      Rennis Chris, APRN, NP-C  Infectious Diseases     I personally saw and evaluated the patient as part of a shared service with an APP.      My substantive findings are:    MRSA TV IE S/P bioprosthetic TVR    I independently of the APP spent a total of 10 minutes in direct/indirect care of this patient  including initial evaluation, review of laboratory, radiology, diagnostic studies, review of medical record, order entry and coordination of care.     Danika Kluender R. Gloriajean Dell MD  Pager [phone]

## 2021-02-14 NOTE — Pharmacy (Signed)
[place] / [facility] of [facility]  Therapeutic Drug Monitoring: Vancomycin  02/14/2021      Patient name: [name] [name], [name] [name]  Date of Birth:  [birth year]    Actual Weight:  Weight: 66.5 kg (146 lb 9.7 oz) (02/14/21 0500)     BMI:  BMI (Calculated): 24.45 (02/14/21 0500)      Date RPh Current regimen (including mg/kg) Indication &  Organism AUC or trough based dosing Target Levels^ SCr (mg/dL) CrCl* (mL/min) Infectious Laboratory Markers (as applicable)   Measured level(s)   (mcg/mL) Calculated AUC (if AUC based monitoring) Plan & predicted AUC/trough if initial dosing (including when levels are due) Comments   8/22 [facility] intermittent dosing at outside hospital with dialysis endocarditis trough 13-17 6.21 12 WBC:11.3  Procal:  CRP: Vanc random 24.4  No Vanc at this time as the level is 24.4 - will proceed with intermittent dosing- dialysis plan unclear at this time - will clarify in the morning to determine further dosing/levels    8/23 TWS Intermittent Dosing As above As above 13-17 CVVHD CVVHD    - Start vancomycin 1000 mg IV q24h while patient is on CRRT  - If CRRT is stopped, discontinue scheduled dose and dose by levels    8/25 TWS Vancomycin 1000 mg IV q24h As above As above 13-17 CVVHD CVVHD  16h level = 19.4  - Continue current dosing  - Obtain trough on 8/26    8/26 [name] [name]  Vanc 1000 mg (15 mg/kg AdjBW) q24h Endocarditis  Trough  13-17 CRRT CRRT WBC: 24.8  16.9 (drawn approp)  -  - Continue current regimen   - Recommend checking trough again in 2-3 days or sooner if clinically indicated (e.g. changing CRRT --> iHD)    8/27 [name] Vancomycin 1000 mg IV q24h As above Trough 20-25 iHD iHD    28.9 pre-iHD level  - Will give supplemental dose after iHD of vancomycin 500 mg IV x 1, random with am labs tomorrow given unclear iHD schedule.     8/28 [name] Intermittent  Vancomycin 500 mg IV x 1 after iHD given on 8/27 As above Trough 20-25 iHD iHD  29.0 pre-iHD  - Defer dosing today. Plan for iHD again  tomorrow (8/29). Consider giving 500 mg or 250 mg with next iHD session.  - Repeat pre iHD level 8/30    8/29 TWS Intermittent dosing As above Trough 20-25 pre-HD iHD iHD  23.2 pre-HD  - Give 500 mg x1 post-HD  - Follow plans for iHD and give 500 mg post-HD    8/31 TWS Intermittent dosing As above Trough 20-25 pre-HD iHD iHD  32.7 pre-HD  - Give 250 mg x1 post-HD  - Follow plans for iHD and give 250 mg post-HD                                                                                                                                                                             ^  Target levels depends on dosing and monitoring method, AUC vs. trough based. For AUC based dosing units are mg*h/L. For trough based dosing units are mcg/mL.     *Creatinine clearance is estimated by using the Cockcroft-Gault equation for adult patients and the Carol Ada for pediatric patients.    The decision to discontinue vancomycin therapy will be determined by the primary service.  Please contact the pharmacist with any questions regarding this patient's medication regimen.

## 2021-02-14 NOTE — Care Plan (Signed)
Medical Nutrition Therapy Calorie Count Note  I: Estimated Needs:    Energy Calorie Requirements: 1550-1850kcalper day (25-30kcals/61 kg)  Protein Requirements (gms/day):57g proteinper day (1.0g/57.3 kg)    Calorie Count day 1: 295 calories, 1 grams of protein, which met 16-19 % of estimated calorie needs and 2 % of estimated protein needs.     Calorie Count day 2:  Pt was NPO for breakfast and lunch. Pt reports she did not have anything for dinner.    Tracey Harries, NDTR  02/14/2021, 08:35  Pager#[phone]

## 2021-02-14 NOTE — Progress Notes (Signed)
[facility]  HVI Critical Care Consult/Progress Note     [name] [name], [place], 35 y.o. female  Date of Birth: [birth year]  Medical Record Number:  [id number]   Inpatient Admission Date: 02/05/2021   Hospital Day:  LOS: 0 days      Chief Complaint: Altered Mental Status   History of Present Illness: Per previous providers: "Pt is a 35 yo female with a significant PMH for Smoking and IVDU presented to [name] with AMS on 01/31/21. 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 Urine drug screen + for heroin and methamphetamines.     [facility]."   POD #/ Procedure: 8/22: [facility]  8/30: On pump beating heart TVR (Epic 33)  PFO colsure   Hospital Course/Major events:  8/22: See above.   -Prop for sedation. Agitated; MAE; does not follow. Repeat CT Head/CAP. Hemodynamics stable. Intubated. Repeated blood cultures. Continue Vanc.   8/23: had pan CT 8/22, still not awakening/folowing commands on precedex  But agitated. MRI brain ordered, started on CRRT  8/24: remains on CRRT, MRI Brain no abnormality, following commands on dexmedetomidine, quetiapine, prn dilaudid  8/25 brocnh today for increased ETT secretions,plan for OR early next week, OMFS following for dental root extriction.  Pt self-extubated this afternoon.  Doing well on NC.  8/26: Intermittent delirium. Weak. Continue Seroquel.  Hemodynamics stable. NC. Aggressive pulm toilet. Continue nebs. Speech to see. Dental procedure today. Continues + BC. ID following; MRI CTLS ordered. Continue antibiotics. Surgical plan TBA.   8/27:  MRI completed today. Intermittent dex. Surgical plan TBA. HD today per nephology. Bedside speech and swallow. Blood  repeat Cx tomorrow.   8/28 repeat TTE today for OR later this week, HD holiday will give 200 mg Lasix for diuresis (followed by diuril 5071mif no response w/lasix) and D/C [place] for line holiday  8/29: OR tomorrow. Agitation. Seroquel. Hemodynamics stable. HD today prior to surgery. New line placed 2/2 line holiday. 2 u PRBC pre op optimization.   8/30: OR for TVR, PFO closure, out of OR intubated, levophed infusing, normal EF  8/31: OBT, deline, continue CT, HD today, repeat BC   Past Medical History: No past medical history on file.    Past Surgical History: No past surgical history on file.   Social History:  Social History            Socioeconomic History    Marital status: Divorced       Spouse name: Not on file    Number of children: Not on file    Years of education: Not on file    Highest education level: Not on file   Occupational History    Not on file   Tobacco Use    Smoking status: Not on file    Smokeless tobacco: Not on file   Substance and Sexual Activity    Alcohol use: Not on file    Drug use: Not on file    Sexual activity: Not on file   Other Topics Concern    Not on file   Social History Narrative    Not on file      Social Determinants of Health      Financial Resource Strain: Not on file  Food Insecurity: Not on file   Transportation Needs: Not on file   Physical Activity: Not on file   Stress: Not on file   Intimate Partner Violence: Not on file   Housing Stability: Not on file       Family History: Family Medical History:       None                Allergies: No Known Allergies   ROS: + incisional pain, anxiety, and shortness of breath   Decline abdominal pain, nausea,vomiting       Problem List:       Patient Active Problem List   Diagnosis    Endocarditis         Medications: Reviewed      Last VS:    .  Filed Vitals:    02/14/21 0500 02/14/21 0800 02/14/21 0900 02/14/21 1015   BP:    123/73   Pulse: 100 77 (!) 102 100   Resp: (!) 23  (!) 41 (!) 28   Temp: (!) 38.7 C (101.66 F) 36.4 C  (97.5 F)     SpO2: (!) 86% 92% 97%       Physical Exam  Vitals reviewed.   Constitutional:       General: She is in acute distress.      Appearance: She is ill-appearing.      Comments: Critically ill appearing female, no acute distress, becomes restless at times   HENT:      Head: Normocephalic.      Mouth/Throat:      Mouth: Mucous membranes are moist.   Eyes:      Extraocular Movements: Extraocular movements intact.      Conjunctiva/sclera: Conjunctivae normal.      Pupils: Pupils are equal, round, and reactive to light.   Cardiovascular:      Rate and Rhythm: Regular rhythm.      Pulses: Normal pulses.   Pulmonary:      Comments: Diminished breath sounds throughout, shallow inspiratory effort    Abdominal:      General: Abdomen is flat. There is no distension.      Palpations: Abdomen is soft. There is no mass.      Hernia: No hernia is present.      Comments: Hypoactive bowel sounds   Musculoskeletal:         General: Swelling present. Normal range of motion.      Cervical back: No rigidity.      Right lower leg: Edema present.      Left lower leg: Edema present.      Comments: B/l l/e pitting edema (3+)   Skin:     General: Skin is warm and dry.   Neurological:      General: No focal deficit present.      Mental Status: She is alert and oriented to person, place, and time.   Psychiatric:      Comments: Anxious              _0 I have reviewed the patient's vitals signs, the nursing notes, the physician's notes and other progress notes      _1 I have reviewed the laboratory and imaging data     _2 I have discussed this case with CCM team, SCT         Systems Based Assessment and Plan:     Neurologic:  Data/Assessment Reviewed    Diagnosis Altered mental status 2/2 encephalopathy   Overdose  2/2 IVDU  IVD withdrawal  Back pain    Course: Critical   Plan D/C am seroquel   Increase oral oxy 15 and 10 mg  Decreased frequency of diluadid pushes   Pain/Psych consult   Remain off precedex gtt   MRI Brain 8/23  negative  MRI of the cervical, thoracic, and lumbosacral spine completed to r/o abscess; negative   Day/night policy, reorientation as needed  PT/OT/Cardiac rehab   Serial neuro checks        Cardiovascular:  Data/Assessment Most Recent Hemodynamics: Reviewed   Cardiovascular support: N/A       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 Completed line holiday.   -SCT evaluating: Surgery completed 8/30; repeat blood cultures POD 1  -Dental: EXT of remaining dentition (21-28)   -ASA      Respiratory:  Data/Assessment: CXR: Reviewed   Most recent VBG: reviewed  Vent Settings: NA      Diagnosis Acute respiratory failure 2/2 Aspiration PNA and altered mental status  Septic pulmonary emboli  Smoker  Mechanical ventilation post op      Course stable   Plan Extubated overnight   F/U BAL from 8/30   FIO2 wean as tolerated for O2 sats>92%  Pulm toilet; scheduled nebs  Monitor CT output, continue to suction  Repeat CXR in am       Renal:  Data/Assessment: Reviewed    Diagnosis Acute kidney injury - multifactorial: septic shock 2/2 MRSA TV endocarditis, IVDU  Hypervolemia and anasarca   Course critical   Plan Nephrology to evaluate for diuresis vs HD today   If patient continue to require HD will need TCC placement and removal of old line  Leave foley today   Replacing electrolytes per order  Strict I&O  Renally adjust medications   Avoid nephrotoxic agents   Repeat BMP in am       Gastrointestinal:  Data/Assessment: NA for stress ulcer prophylaxis   Diet:  Taking PO     Bowel Regimen:  Colace, Miralax     Diagnosis Dysphagia  Enlarged liver  Distended gallbladder with mild elevation in  Alk phos and bilirubin  constipation   Course  Stable    Plan Advance diet as tolerated   Continue bowel regimen: colace, miralax  Continue stress ulcer prophylaxis   Speech/bedside to evaluate swallow ; passed   Monitor lfts and abdominal exam - if increasing bilirubin/alk phos and/or abdominal tenderness will obtain gallbladder ultrasound and abdominal duplex      Endocrine:  Data/Assessment Last three Fingerstick glucose: Reviewed   Last HbA1C: NA  Most recent Thyroid results: 2.3      Diagnosis Hyperglycemia    Course Stable   Plan SSI Protocol q TIDAC/HS   Goal 140-180         Hematologic:  Data/Assessment:  Reviewed    Diagnosis Anemia of chronic disease and Thrombocytopenia   Course Stable   Plan SCDs (Sequential Compression Device)  For DVT Prophylaxis  Start heparin gtt for TVR   Plan to d/c on coumadin per SCT  Continue to closely monitor cbc/coags   Monitor CT output      Infectious Diseases:  Data/Assessment Tmax last 24 hrs: No data recorded.       Diagnosis MRSA TV Endocarditis   Course Critical   Plan Antibiotic therapy  Agent:  Vancomycin  Indication: MRSA      repeat cultures; - follow up results; repeat cultures POD 1, follow OR cultures  ID following; see above in Cardio    Will continue to monitor for additional s/s of infection   8/29: New trialysis line placed 2/2 line holiday          Family Communication and Disposition:  Data/Assessment Patient updated with plan of care      I independently of the faculty provider spent a total of 35 minutes in direct/indirect care of this patient including initial evaluation, review of laboratory, radiology, diagnostic studies, review of medical record, order entry and coordination of care.    Flonnie Hailstone PA-C 02/14/2021         Late entry   Critical Care Staff  I personally saw and examined the patient on 02/14/21   Discussed treatment plan with midlevel.    I agree with the plan of care as documented.   Plan developed together  Electronically Signed  by:    Hilliard Clark, MD 02/16/2021, 07:39

## 2021-02-14 NOTE — Ancillary Notes (Signed)
Chart reviewed. Patient is currently on CRRT. Visited pt to complete cardiac rehab social evaluation in cardiac rehab flowsheet.  Will continue to follow and provide cardiac rehab services as appropriate.     Patient in bed with call bell in reach.    [name], BS, ES  Cardiac Rehabilitation  [phone]

## 2021-02-14 NOTE — Care Management Notes (Signed)
[facility]  Care Management Note    Patient Name: [name] [name]  Date of Birth: [birth year]  Sex: female  Date/Time of Admission: 02/05/2021 12:34 AM  Room/Bed: 19/A  Payor: Alecia Lemming MEDICAID / Plan: Alecia Lemming [place]  MEDICAID / Product Type: Medicaid [place] /    LOS: 9 days   Primary Care Providers:  Pcp, No (General)    Admitting Diagnosis:  Endocarditis [I38]    Assessment:      02/14/21 1317   Assessment Detail   Assessment Type Continued Assessment   Date of Care Management Update 02/14/21   Date of Next DCP Update 02/16/21   Social Work Plan   Discharge Planning Status plan in progress   Projected Discharge Date 02/21/21   CM will evaluate for rehabilitation potential yes   Discharge Needs Assessment   Discharge Facility/Level of Care Needs Home vs Home with Home Health   Transportation Available car;family or friend will provide       Discharge Plan:  Home vs home with Home Health  Per chart review, POD#1 s/p TVR. Pt remains on a heparin drip, and is on room air. Nephrology following; HD treatment today (8/31). ID following; pt is on IV vancomycin for endocarditis (day 1 of 42).  Chest tubes and foley remain in place. Speech Therapy following. Pt is on a renal diet, mechanical soft. In-basket consult to Care Management for d/c needs s/p cardiac surgery. PT/OT following; PT/OT evaluated the pt on 8/29 and at that time recommended home with no DME anticipated. Anticipate pt may transfer to the [facility] service for abx course. D/C date and disposition TBD, pending pt's progress. Consult closed. MSW Jearld Shines will continue to follow.        The patient will continue to be evaluated for developing discharge needs.     Case Manager: Elease Hashimoto, [place]  Phone: [phone]

## 2021-02-15 ENCOUNTER — Inpatient Hospital Stay (HOSPITAL_COMMUNITY): Payer: Medicaid Other

## 2021-02-15 DIAGNOSIS — R0689 Other abnormalities of breathing: Secondary | ICD-10-CM

## 2021-02-15 DIAGNOSIS — A4102 Sepsis due to Methicillin resistant Staphylococcus aureus: Principal | ICD-10-CM

## 2021-02-15 DIAGNOSIS — J189 Pneumonia, unspecified organism: Secondary | ICD-10-CM

## 2021-02-15 DIAGNOSIS — Z978 Presence of other specified devices: Secondary | ICD-10-CM

## 2021-02-15 DIAGNOSIS — R918 Other nonspecific abnormal finding of lung field: Secondary | ICD-10-CM

## 2021-02-15 DIAGNOSIS — R140 Abdominal distension (gaseous): Secondary | ICD-10-CM

## 2021-02-15 DIAGNOSIS — I959 Hypotension, unspecified: Secondary | ICD-10-CM

## 2021-02-15 DIAGNOSIS — Z9889 Other specified postprocedural states: Secondary | ICD-10-CM

## 2021-02-15 LAB — OR CULTURE(AEROBIC AND GRAM STAIN)

## 2021-02-15 LAB — BASIC METABOLIC PANEL
ANION GAP: 7 mmol/L (ref 4–13)
BUN/CREA RATIO: 7 (ref 6–22)
BUN: 16 mg/dL (ref 8–25)
CALCIUM: 8.4 mg/dL — ABNORMAL LOW (ref 8.5–10.0)
CHLORIDE: 98 mmol/L (ref 96–111)
CO2 TOTAL: 24 mmol/L (ref 22–30)
CREATININE: 2.31 mg/dL — ABNORMAL HIGH (ref 0.60–1.05)
ESTIMATED GFR: 28 mL/min/BSA — ABNORMAL LOW (ref >=60)
GLUCOSE: 100 mg/dL (ref 65–125)
POTASSIUM: 4.1 mmol/L (ref 3.5–5.1)
SODIUM: 129 mmol/L — ABNORMAL LOW (ref 136–145)

## 2021-02-15 LAB — POC BLOOD GLUCOSE (RESULTS): GLUCOSE, POC: 97 mg/dl (ref 70–105)

## 2021-02-15 LAB — HEPATIC FUNCTION PANEL
ALBUMIN: 1.5 g/dL — ABNORMAL LOW (ref 3.5–5.0)
ALKALINE PHOSPHATASE: 91 U/L (ref 40–110)
ALT (SGPT): 6 U/L — ABNORMAL LOW (ref 8–22)
AST (SGOT): 17 U/L (ref 8–45)
BILIRUBIN DIRECT: 0.3 mg/dL (ref 0.1–0.4)
BILIRUBIN TOTAL: 0.5 mg/dL (ref 0.3–1.3)
PROTEIN TOTAL: 6.9 g/dL (ref 6.4–8.3)

## 2021-02-15 LAB — ADULT ROUTINE BLOOD CULTURE, SET OF 2 BOTTLES (BACTERIA AND YEAST)
BLOOD CULTURE, ROUTINE: ABNORMAL — CR
BLOOD CULTURE, ROUTINE: ABNORMAL — CR
BLOOD CULTURE, ROUTINE: ABNORMAL — CR

## 2021-02-15 LAB — PHOSPHORUS: PHOSPHORUS: 3.8 mg/dL (ref 2.4–4.7)

## 2021-02-15 LAB — BRONCHOALVEOLAR LAVAGE (BAL) QUANTITATIVE CULTURE/GRAM STAIN: BRONCHOALVEOLAR LAVAGE (BAL) QUANTITATIVE CULTURE: >100000 — AB

## 2021-02-15 LAB — PTT (PARTIAL THROMBOPLASTIN TIME)
APTT: 26.7 seconds (ref 24.2–37.5)
APTT: 27.7 seconds (ref 24.2–37.5)
APTT: 21.9 seconds — ABNORMAL LOW (ref 24.2–37.5)
APTT: 41.3 seconds — ABNORMAL HIGH (ref 24.2–37.5)

## 2021-02-15 LAB — C-REACTIVE PROTEIN(CRP),INFLAMMATION: CRP INFLAMMATION: 190.5 mg/L — ABNORMAL HIGH (ref <8.0)

## 2021-02-15 LAB — ANTI-STREPTOLYSIN O: ANTI-STREPTOLYSIN O: 91 IU/mL (ref <200)

## 2021-02-15 MED ORDER — METHYLNALTREXONE 12 MG/0.6 ML SUBCUTANEOUS SOLUTION
0.1500 mg/kg | Freq: Once | SUBCUTANEOUS | Status: DC
Start: 2021-02-15 — End: 2021-02-15

## 2021-02-15 MED ORDER — METHYLNALTREXONE 12 MG/0.6 ML SUBCUTANEOUS SOLUTION
6.0000 mg | Freq: Once | SUBCUTANEOUS | Status: AC
Start: 2021-02-15 — End: 2021-02-15
  Administered 2021-02-15: 6 mg via SUBCUTANEOUS
  Filled 2021-02-15: qty 0.3

## 2021-02-15 MED ORDER — SODIUM CHLORIDE 0.9 % (FLUSH) INJECTION SYRINGE
2.0000 mL | INJECTION | INTRAMUSCULAR | Status: DC | PRN
Start: 2021-02-15 — End: 2021-02-21

## 2021-02-15 MED ORDER — MAGNESIUM HYDROXIDE 400 MG/5 ML ORAL SUSPENSION
30.0000 mL | Freq: Once | ORAL | Status: AC
Start: 2021-02-15 — End: 2021-02-15
  Administered 2021-02-15: 2400 mg via ORAL
  Filled 2021-02-15: qty 90

## 2021-02-15 MED ORDER — SODIUM CHLORIDE 0.9 % (FLUSH) INJECTION SYRINGE
10.0000 mL | INJECTION | Freq: Three times a day (TID) | INTRAMUSCULAR | Status: DC
Start: 2021-02-15 — End: 2021-02-21
  Administered 2021-02-15 – 2021-02-16 (×3): 10 mL
  Administered 2021-02-16: 0 mL
  Administered 2021-02-17: 10 mL
  Administered 2021-02-17: 0 mL
  Administered 2021-02-17: 10 mL
  Administered 2021-02-18: 20 mL
  Administered 2021-02-18: 10 mL
  Administered 2021-02-18: 0 mL
  Administered 2021-02-19: 30 mL
  Administered 2021-02-19 – 2021-02-20 (×5): 10 mL
  Administered 2021-02-21: 0 mL
  Administered 2021-02-21: 10 mL

## 2021-02-15 MED ORDER — FUROSEMIDE 10 MG/ML INJECTION SOLUTION
80.0000 mg | Freq: Two times a day (BID) | INTRAMUSCULAR | Status: DC
Start: 2021-02-15 — End: 2021-02-19
  Administered 2021-02-15 – 2021-02-19 (×5): 80 mg via INTRAVENOUS
  Filled 2021-02-15 (×5): qty 8

## 2021-02-15 MED ORDER — SODIUM CHLORIDE 0.9 % (FLUSH) INJECTION SYRINGE
20.0000 mL | INJECTION | INTRAMUSCULAR | Status: DC | PRN
Start: 2021-02-15 — End: 2021-02-21

## 2021-02-15 MED ORDER — SODIUM CHLORIDE 0.9 % (FLUSH) INJECTION SYRINGE
2.0000 mL | INJECTION | Freq: Three times a day (TID) | INTRAMUSCULAR | Status: DC
Start: 2021-02-15 — End: 2021-02-21
  Administered 2021-02-15 – 2021-02-17 (×7): 0 mL
  Administered 2021-02-18: 4 mL
  Administered 2021-02-18: 5 mL
  Administered 2021-02-18: 0 mL
  Administered 2021-02-19: 5 mL
  Administered 2021-02-19 (×2): 4 mL
  Administered 2021-02-20: 6 mL
  Administered 2021-02-20: 5 mL
  Administered 2021-02-20: 4 mL
  Administered 2021-02-21: 5 mL
  Administered 2021-02-21: 0 mL

## 2021-02-15 MED ORDER — MINERAL OIL ENEMA
133.0000 mL | ENEMA | Freq: Once | RECTAL | Status: AC
Start: 2021-02-15 — End: 2021-02-15
  Administered 2021-02-15: 133 mL via RECTAL

## 2021-02-15 MED ORDER — METOPROLOL TARTRATE 6.25 MG QUARTER TABLET
6.2500 mg | ORAL_TABLET | Freq: Two times a day (BID) | ORAL | Status: DC
Start: 2021-02-15 — End: 2021-02-16
  Administered 2021-02-15 (×3): 6.25 mg via ORAL
  Filled 2021-02-15 (×3): qty 1

## 2021-02-15 MED ORDER — SENNOSIDES 8.6 MG-DOCUSATE SODIUM 50 MG TABLET
2.0000 | ORAL_TABLET | Freq: Two times a day (BID) | ORAL | Status: DC
Start: 2021-02-15 — End: 2021-02-16
  Administered 2021-02-15 – 2021-02-16 (×4): 2 via ORAL
  Filled 2021-02-15 (×4): qty 2

## 2021-02-15 MED ORDER — HYDRALAZINE 20 MG/ML INJECTION SOLUTION
10.0000 mg | INTRAMUSCULAR | Status: DC | PRN
Start: 2021-02-15 — End: 2021-03-02
  Administered 2021-02-22: 10 mg via INTRAVENOUS
  Filled 2021-02-15 (×2): qty 1

## 2021-02-15 MED ORDER — BISACODYL 10 MG RECTAL SUPPOSITORY
10.0000 mg | Freq: Once | RECTAL | Status: AC
Start: 2021-02-15 — End: 2021-02-15
  Administered 2021-02-15: 10 mg via RECTAL
  Filled 2021-02-15: qty 1

## 2021-02-15 MED ORDER — LIDOCAINE (PF) 10 MG/ML (1 %) INJECTION SOLUTION
0.5000 mL | Freq: Once | INTRAMUSCULAR | Status: AC
Start: 2021-02-15 — End: 2021-02-15
  Administered 2021-02-15: 0.5 mL via INTRADERMAL

## 2021-02-15 MED ORDER — POLYETHYLENE GLYCOL 3350 17 GRAM ORAL POWDER PACKET
34.0000 g | Freq: Every day | ORAL | Status: DC
Start: 2021-02-15 — End: 2021-03-02
  Administered 2021-02-15 – 2021-02-20 (×6): 34 g via ORAL
  Administered 2021-02-21 – 2021-02-22 (×2): 0 g via ORAL
  Administered 2021-02-23 – 2021-02-26 (×4): 34 g via ORAL
  Administered 2021-02-27: 0 g via ORAL
  Administered 2021-02-28: 34 g via ORAL
  Administered 2021-03-01 – 2021-03-02 (×2): 0 g via ORAL
  Filled 2021-02-15 (×14): qty 2

## 2021-02-15 NOTE — Care Plan (Signed)
[place]  Speech Therapy Cognitive/Linguistic Evaluation    Patient Name: [name] [name]  Date of Birth: 1986/05/26  Weight:  65.9 kg (145 lb 4.5 oz)  Room/Bed: 19/A  Payor: Alecia Lemming MEDICAID / Plan: Alecia Lemming [place] MEDICAID / Product Type: Medicaid [place] /     Date/Time of Admission: 02/05/2021 12:34 AM  Admitting Diagnosis:  Endocarditis [I38]      Assessment:     This patient presents with cognitive-linguistic abilities that are judged to be within functional limits for this level of care. The patient demonstrates strengths in abstract language (idioms, proverbs), simple-moderately complex problem solving, categorization, immediate recall, and similarities vs differences. The patient's weakness lied in delayed recall and working memory. Suspect patient is back to baseline function from a speech/language/cognitive perspective.     Important to note that the cognitive linguistic screening is a general collective of question areas that function to briefly evaluate the need for further skilled evaluation in an area and may have questions that may fall above the education level of some individuals, thus incorrectly answered knowledge-specific questions may result in a lower score. (i.e. "Who discovered American?", "What are the two political parties", etc.)    Rehab Potential: Good     JUSTIFICATION OF DISCHARGE RECOMMENDATION  Based on current diagnosis, functional performance prior to admission, and current  functional performance, this patient requires continued Speech services   in Outpatient Therapy in order to achieve significant functional improvements in these   deficit areas: Cognition.    Goals:   LTG: The patient will demonstrate appropriate cognitive-linguistic skills to facilitate independence and safety with daily needs, simple, and complex tasks.      LTG: The patient will adequately maintain nutrition and hydration without overt signs and symptoms of aspiration on the  safest and least restrictive diet.     Plan:   Plan: This patient's cognitive linguistic function is judged to be within functional limits fr this level of care, no further skilled cognitive-linguistic therapy is indicated at this time. ST to signs off, discussed with MD. Re-consult if indicated.   Results & Recommendations Discussed With:Nurse and MD   The risks/benefits of therapy have been discussed with the patient/caregiver and he/she is in agreement with the established plan of care.       Subjective:   Patient seen reclined in chair with first cousin and his wife at the bedside. Patient agreeable to evaluation. Indicating 6/10 pain at level of the chest.     Flowsheet Info:      02/15/21 1300   Therapist Pager   SLP Pager [place]   Rehab Session   Document Type evaluation  (cognitive)   Total SLP Minutes: 30   Patient Effort good     Pertinent Diagnosis: Endocarditis  Patient's Goal: To go home    Objective:     Orientation: Oriented x3, oriented to place with moderate cueing  Total Scoring: 81/100  Person - 4 correct of 5 total asked.  Place - 3 correct of 4 total asked.  Time: 5 correct of 5 total asked.  General: 6 correct of 10 total asked.  Antonyms: 10 correct of 10 total asked.  Synonyms: 10 correct of 10 total asked.  Intangibles: 5 correct of 5 total asked.  Similarities/Differences: 5 correct of 5 total asked.  Categorization: 4 correct of 5 total asked.  Immediate Recall: 4 correct of 5 total asked.  Delayed Recall: 2 correct of 6 total asked.  Common  Attributes: 4 correct of 5 total asked.  Incongruities/Absurdities: 5 correct of 5 total asked.  Problem Solving:   Complex - 5 correct of 5 total asked.  Abstract Language:  Idiom Interpretation - 5 correct of 5 total asked.  Proverb Interpretation - 3 correct of 5 total asked.    Therapist:   Clifton Custard, ST   Pager #: [phone]  Phone#: [phone]  Evaluation Time: 30 minutes

## 2021-02-15 NOTE — Procedures (Signed)
02/15/2021    CT outpt and CXR reviewed with SCT; recommend removal of CT. CT removed from suction, no air leaks noted. Skin cleaned and sutures removed. Xeroform placed and dressing placed. CT removed on force exhalation dressing reinforced. Patient tolerated the procedure without complications.       Flonnie Hailstone PA-C 02/15/2021

## 2021-02-15 NOTE — Progress Notes (Signed)
[facility]  HVI Critical Care Consult/Progress Note     [name] [name], [name] [name], 35 y.o. female  Date of Birth: [birth year]  Medical Record Number:  [id number]   Inpatient Admission Date: 02/05/2021   Hospital Day:  LOS: 0 days      Chief Complaint: Altered Mental Status   History of Present Illness: Per previous providers: "Pt is a 35 yo female with a significant PMH for Smoking and IVDU presented to [name] with AMS on 01/31/21. She was found to have a drug overdose, aspiration PNA requiring intubation, MRSA bacteremia, large TV endocarditis with moderate TR and elevated systemic pulmonary pressures (2mhg), AKI requiring HD, anemia requiring PRBC and thrombocytopenia. Urine drug screen + for heroin and methamphetamines.     [facility]."   POD #/ Procedure: 8/22: [facility]  8/30: On pump beating heart TVR (Epic 33)  PFO colsure   Hospital Course/Major events:  8/22: See above.   -Prop for sedation. Agitated; MAE; does not follow. Repeat CT Head/CAP. Hemodynamics stable. Intubated. Repeated blood cultures. Continue Vanc.   8/23: had pan CT 8/22, still not awakening/folowing commands on precedex  But agitated. MRI brain ordered, started on CRRT  8/24: remains on CRRT, MRI Brain no abnormality, following commands on dexmedetomidine, quetiapine, prn dilaudid  8/25 brocnh today for increased ETT secretions,plan for OR early next week, OMFS following for dental root extriction.  Pt self-extubated this afternoon.  Doing well on NC.  8/26: Intermittent delirium. Weak. Continue Seroquel.  Hemodynamics stable. NC. Aggressive pulm toilet. Continue nebs. Speech to see. Dental procedure today. Continues + BC. ID following; MRI CTLS ordered. Continue antibiotics. Surgical plan TBA.   8/27:  MRI completed today. Intermittent dex. Surgical plan TBA. HD today per nephology. Bedside speech and swallow. Blood  repeat Cx tomorrow.   8/28 repeat TTE today for OR later this week, HD holiday will give 200 mg Lasix for diuresis (followed by diuril 5031mif no response w/lasix) and D/C [place] for line holiday  8/29: OR tomorrow. Agitation. Seroquel. Hemodynamics stable. HD today prior to surgery. New line placed 2/2 line holiday. 2 u PRBC pre op optimization.   8/30: OR for TVR, PFO closure, out of OR intubated, levophed infusing, normal EF  8/31: OBT, deline, continue CT, HD today, repeat BC  9/1: hypertensive; started lopressor 6.2560mID, repeat BC, lasix 80 IV BID started, holding HD, repeat BC    Past Medical History: No past medical history on file.    Past Surgical History: No past surgical history on file.   Social History:  Social History            Socioeconomic History    Marital status: Divorced       Spouse name: Not on file    Number of children: Not on file    Years of education: Not on file    Highest education level: Not on file   Occupational History    Not on file   Tobacco Use    Smoking status: Not on file    Smokeless tobacco: Not on file   Substance and Sexual Activity    Alcohol use: Not on file    Drug use: Not on file    Sexual activity: Not on file   Other Topics Concern    Not on file   Social History Narrative    Not on file  Social Determinants of Health      Financial Resource Strain: Not on file   Food Insecurity: Not on file   Transportation Needs: Not on file   Physical Activity: Not on file   Stress: Not on file   Intimate Partner Violence: Not on file   Housing Stability: Not on file       Family History: Family Medical History:       None                Allergies: No Known Allergies   ROS: Slept okay overnight   Reports improving pain  Declines shortness of breath, nausea, abdominal pain       Problem List:       Patient Active Problem List   Diagnosis    Endocarditis         Medications: Reviewed      Last VS:    .  Filed Vitals:    02/15/21 0900 02/15/21 1000 02/15/21 1115 02/15/21  1200   BP: (!) 148/93 (!) 144/95 (!) 145/89    Pulse: (!) 102 (!) 102 98    Resp: (!) 40 (!) 33 (!) 41    Temp:    36.6 C (97.9 F)   SpO2: 94% 95% (!) 84%       Physical Exam  Vitals reviewed.   Constitutional:       Appearance: She is ill-appearing.      Comments: Critically ill appearing female, no acute distress, becomes restless at times   HENT:      Head: Normocephalic.      Mouth/Throat:      Mouth: Mucous membranes are moist.   Eyes:      Extraocular Movements: Extraocular movements intact.      Conjunctiva/sclera: Conjunctivae normal.      Pupils: Pupils are equal, round, and reactive to light.   Cardiovascular:      Rate and Rhythm: Regular rhythm.      Pulses: Normal pulses.   Pulmonary:      Comments: Diminished breath sounds throughout, shallow inspiratory effort    Abdominal:      General: Abdomen is flat. There is no distension.      Palpations: Abdomen is soft. There is no mass.      Hernia: No hernia is present.      Comments: Hypoactive bowel sounds   Musculoskeletal:         General: Swelling present. Normal range of motion.      Cervical back: No rigidity.      Right lower leg: Edema present.      Left lower leg: Edema present.   Skin:     General: Skin is warm and dry.   Neurological:      General: No focal deficit present.      Mental Status: She is alert and oriented to person, place, and time.   Psychiatric:      Comments: Anxious              _0 I have reviewed the patient's vitals signs, the nursing notes, the physician's notes and other progress notes      _1 I have reviewed the laboratory and imaging data     _2 I have discussed this case with CCM team, SCT         Systems Based Assessment and Plan:     Neurologic:  Data/Assessment Reviewed    Diagnosis Altered mental status 2/2 encephalopathy   Overdose 2/2 IVDU  IVD withdrawal  Back pain    Course: Critical, improving    Plan Continue oral oxy 15 and 10 mg  Pain/Psych consult   MRI Brain 8/23 negative  MRI of the cervical, thoracic,  and lumbosacral spine completed to r/o abscess; negative   Day/night policy, reorientation as needed  PT/OT/Cardiac rehab   Serial neuro checks        Cardiovascular:  Data/Assessment Most Recent Hemodynamics: Reviewed   Cardiovascular support: N/A       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; s/p PFO closure  Moderate TR  Mild Pulmonary HTN  Hypertension    Course Improving    Plan Start oral lopressor 6.41m BID; titrate as tolerated   Prn hydralazine started overnight   Continue heparin gtt per SCT with plan to discharge on coumadin   Continue ASA       Respiratory:  Data/Assessment: CXR: Reviewed   Most recent VBG: reviewed  Vent Settings: NA      Diagnosis Acute respiratory failure 2/2 Aspiration PNA and altered mental status  Septic pulmonary emboli  Smoker  MRSA PNA   Mechanical ventilation post op      Course stable   Plan F/U BAL from 8/30 + MRSA   FIO2 wean as tolerated for O2 sats>92%  Pulm toilet; scheduled nebs  Monitor CT output, continue to suction  Repeat CXR in am       Renal:  Data/Assessment: Reviewed    Diagnosis Acute kidney injury - multifactorial: septic shock 2/2 MRSA TV endocarditis, IVDU  Hypervolemia and anasarca   Course critical   Plan Lasix 80 IV BID started   Hold off on HD today per nephrology   Last HD (8/31)   Repeat BMP this afternoon   Replacing electrolytes per order  Strict I&O  Renally adjust medications   Avoid nephrotoxic agents   Repeat BMP in am       Gastrointestinal:  Data/Assessment: Protonix for stress ulcer prophylaxis   Diet:  Taking PO     Bowel Regimen:  Senna  Miralax     Diagnosis Dysphagia  Enlarged liver  Distended gallbladder with mild elevation in Alk phos and bilirubin  constipation   Course  Stable    Plan Increased bowel regimen; give MOM and suppository   Consider reglan and reliastor   Advance diet as tolerated   Continue stress ulcer prophylaxis   Speech/bedside to evaluate swallow ; passed       Endocrine:  Data/Assessment Last three  Fingerstick glucose: Reviewed   Last HbA1C: NA  Most recent Thyroid results: 2.3      Diagnosis Hyperglycemia    Course Stable   Plan Target Goal 140-180  SSI Protocol PRN       Hematologic:  Data/Assessment:  Reviewed    Diagnosis Anemia of chronic disease and Thrombocytopenia   Course Stable   Plan SCDs (Sequential Compression Device)  For DVT Prophylaxis  Continue heparin gtt for TVR   Plan to d/c on coumadin per SCT   Continue to closely monitor cbc/coags   Monitor CT output      Infectious Diseases:  Data/Assessment Tmax last 24 hrs: Reviewed        Diagnosis MRSA TV Endocarditis  MRSA PNA    Course Critical   Plan Obtain repeat BC today   ID asked to reevaluate patient today   Plan for IV Vanc per protocol x 6 weeks from negative BC  BAL 8/30 + MRSA  [facility] 8/31 = GPCocci/Cluster       Antibiotic therapy  Agent:  Vancomycin  Indication: MRSA      Will continue to monitor for additional s/s of infection         Family Communication and Disposition:  Data/Assessment Patient updated with plan of care     Dipso: Transfer to SDU     Flonnie Hailstone PA-C 02/15/2021       Late entry   Critical Care Staff  I personally saw and examined the patient on 02/15/21   Discussed treatment plan with midlevel.    I agree with the plan of care as documented.   Plan developed together  Electronically Signed by:    Hilliard Clark, MD 02/16/2021, 07:43
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
## 2021-02-15 NOTE — Ancillary Notes (Signed)
Visited patient for ambulation, did not complete ambulation as patient was with another service.       Will follow-up with patient for further cardiac rehab services.    Afreen Siebels, MS, EP  Cardiac Rehabilitation  #[phone]

## 2021-02-15 NOTE — Care Plan (Signed)
[facility]  Occupational Therapy Initial Evaluation    Patient Name: [name] [name]  Date of Birth: 11/18/1985  Height: Height: 165.1 cm ('5\' 5"'$ )  Weight: Weight: 65.9 kg (145 lb 4.5 oz)  Room/Bed: 19/A  Payor: Alecia Lem[place] MEDICAID / Plan: [employer] [place] MEDICAID / Product Type: Medicaid [place] /     Assessment:   Patient tolerated tx session well this date. Patient progressing with functional activity tolerance and mobility. Patient continues to require Min assist for STS transition. With continued intervention and mobility patient is expected to be able to return home with 24/7      Discharge Needs:   Equipment Recommendation: to be determined      Discharge Disposition: home with 24/7 assistance    JUSTIFICATION OF DISCHARGE RECOMMENDATION   Based on current diagnosis, functional performance prior to admission, and current functional performance, this patient requires continued OT services in home with 24/7 assistance  in order to achieve significant functional improvements.    Plan:   Current Intervention: ADL retraining, balance training, endurance training, strengthening, therapeutic exercise, transfer training    To provide Occupational therapy services 1x/day, minimum of 2x/week, until discharge.       The risks/benefits of therapy have been discussed with the patient/caregiver and he/she is in agreement with the established plan of care.       Subjective & Objective        02/15/21 0942   Therapist Pager   OT Assigned/ Pager # [phone]   Rehab Session   Document Type therapy progress note (daily note)   Total OT Minutes: 15   Patient Effort good   Symptoms Noted During/After Treatment shortness of breath   Symptoms Noted Comment patient with shortness of breath with mobility; SpO2 in mid 90's throughout   General Information   Patient Profile Reviewed yes   Medical Lines PIV Line;Telemetry;External Pacer;Chest Tube   Respiratory Status room air   Existing Precautions/Restrictions  full code;fall precautions   Pre Treatment Status   Pre Treatment Patient Status Patient sitting in bedside chair or w/c;Call light within reach;Telephone within reach;Sitter select activated   Support Present Pre Treatment  None   Communication Pre Treatment  Nurse   Mutuality/Individual Preferences   Individualized Care Needs OOB with Ax2   Vital Signs   Pre-Treatment Heart Rate (beats/min) 101   Post-treatment Heart Rate (beats/min) 104   Pre SpO2 (%) 95   O2 Delivery Pre Treatment room air   Post SpO2 (%) 97   O2 Delivery Post Treatment room air   Coping/Psychosocial   Observed Emotional State calm;cooperative;flat   Coping/Psychosocial Response Interventions   Plan Of Care Reviewed With patient   Cognitive Assessment/Interventions   Behavior/Mood Observations alert;cooperative;flat affect   Mobility Assessment/Training   Mobility Comment Patient transitioned from STS from bedside chair with Min assist x2. Patient able to ambulate with CG assist x2 with close chair follow. Patient able to ambulate ~125' before c/o fatigue and transitioned to sitting in chair for ~21mn  rest break. Patient then able to ambulate ~125' and CG assist and returned to sitting in bedside chair.   Transfer Assessment/Treatment   Sit-Stand Independence contact guard assist;verbal cues required;2 person assist required   Stand-Sit Independence contact guard assist;verbal cues required   Sit-Stand-Sit, Assist Device side by side   Transfer Impairments balance impaired;coordination impaired;endurance;strength decreased   Balance Skill Training   Comment with side by side   Sitting Balance: Static fair + balance  Sitting, Dynamic (Balance) fair balance   Sit-to-Stand Balance fair balance   Standing Balance: Static fair balance   Standing Balance: Dynamic fair - balance   Systems Impairment Contributing to Balance Disturbance musculoskeletal   Post Treatment Status   Post Treatment Patient Status Patient sitting in bedside chair or w/c;Call  light within reach;Telephone within reach;Sitter select activated   Support Present Post Treatment  Family present  (mother)   Financial trader Nurse   Clinical Impression   Functional Level at Time of Session Patient tolerated tx session well this date. Patient progressing with functional activity tolerance and mobility. Patient continues to require Min assist for STS transition. With continued intervention and mobility patient is expected to be able to return home with 24/7   Anticipated Discharge Disposition home with 24/7 assistance   Highest level of Mobility score   Exercise/Activity Level Performed 7- Walked 25 feet or more       Therapist:   Trellis Moment, OT   Pager #: [phone]

## 2021-02-15 NOTE — Nurses Notes (Signed)
Vascular Access Consult:    Clarified with Kathie Dike with neph re: GFR and AKI possible on CKD.  Agreed that midline ok for patient as improving from neph perspective.      Kennith Gain BSN, RN, Contractor, [facility]  Company secretary   Pager [phone]

## 2021-02-16 ENCOUNTER — Inpatient Hospital Stay (HOSPITAL_COMMUNITY): Payer: Medicaid Other

## 2021-02-16 DIAGNOSIS — D692 Other nonthrombocytopenic purpura: Secondary | ICD-10-CM

## 2021-02-16 DIAGNOSIS — Z9889 Other specified postprocedural states: Secondary | ICD-10-CM

## 2021-02-16 DIAGNOSIS — J984 Other disorders of lung: Secondary | ICD-10-CM

## 2021-02-16 DIAGNOSIS — L089 Local infection of the skin and subcutaneous tissue, unspecified: Secondary | ICD-10-CM

## 2021-02-16 DIAGNOSIS — Z978 Presence of other specified devices: Secondary | ICD-10-CM

## 2021-02-16 DIAGNOSIS — Z952 Presence of prosthetic heart valve: Secondary | ICD-10-CM

## 2021-02-16 DIAGNOSIS — R21 Rash and other nonspecific skin eruption: Secondary | ICD-10-CM

## 2021-02-16 DIAGNOSIS — K567 Ileus, unspecified: Secondary | ICD-10-CM

## 2021-02-16 LAB — SURGICAL PATHOLOGY SPECIMEN

## 2021-02-16 LAB — CBC
HCT: 28.7 % — ABNORMAL LOW (ref 34.8–46.0)
HGB: 9.2 g/dL — ABNORMAL LOW (ref 11.5–16.0)
MCH: 26.6 pg (ref 26.0–32.0)
MCHC: 32.1 g/dL (ref 31.0–35.5)
MCV: 82.9 fL (ref 78.0–100.0)
MPV: 9.3 fL (ref 8.7–12.5)
PLATELETS: 294 10*3/uL (ref 150–400)
RBC: 3.46 10*6/uL — ABNORMAL LOW (ref 3.85–5.22)
RDW-CV: 20.4 % — ABNORMAL HIGH (ref 11.5–15.5)
WBC: 11.1 10*3/uL — ABNORMAL HIGH (ref 3.7–11.0)

## 2021-02-16 LAB — BASIC METABOLIC PANEL
ANION GAP: 8 mmol/L (ref 4–13)
BUN/CREA RATIO: 10 (ref 6–22)
BUN: 31 mg/dL — ABNORMAL HIGH (ref 8–25)
CALCIUM: 7.3 mg/dL — ABNORMAL LOW (ref 8.5–10.0)
CHLORIDE: 98 mmol/L (ref 96–111)
CO2 TOTAL: 22 mmol/L (ref 22–30)
CREATININE: 3.12 mg/dL — ABNORMAL HIGH (ref 0.60–1.05)
ESTIMATED GFR: 19 mL/min/BSA — ABNORMAL LOW (ref >=60)
GLUCOSE: 91 mg/dL (ref 65–125)
POTASSIUM: 4.2 mmol/L (ref 3.5–5.1)
SODIUM: 128 mmol/L — ABNORMAL LOW (ref 136–145)

## 2021-02-16 LAB — ADULT ROUTINE BLOOD CULTURE, SET OF 2 BOTTLES (BACTERIA AND YEAST)

## 2021-02-16 LAB — MANUAL DIFF AND MORPHOLOGY-SYSMEX
BASOPHIL #: 0.1 10*3/uL (ref <=0.20)
BASOPHIL %: 1 %
EOSINOPHIL #: 0.21 10*3/uL (ref <=0.50)
EOSINOPHIL %: 2 %
LYMPHOCYTE #: 0.73 10*3/uL — ABNORMAL LOW (ref 1.00–4.80)
LYMPHOCYTE %: 7 %
METAMYELOCYTE %: 2 %
MONOCYTE #: 1.14 10*3/uL — ABNORMAL HIGH (ref 0.20–1.10)
MONOCYTE %: 11 %
NEUTROPHIL #: 8.01 10*3/uL — ABNORMAL HIGH (ref 1.50–7.70)
NEUTROPHIL %: 77 %
RBC MORPHOLOGY: NORMAL

## 2021-02-16 LAB — CBC WITH DIFF
HCT: 27.7 % — ABNORMAL LOW (ref 34.8–46.0)
HGB: 8.9 g/dL — ABNORMAL LOW (ref 11.5–16.0)
MCH: 26.7 pg (ref 26.0–32.0)
MCHC: 32.1 g/dL (ref 31.0–35.5)
MCV: 83.2 fL (ref 78.0–100.0)
MPV: 9.3 fL (ref 8.7–12.5)
PLATELETS: 266 10*3/uL (ref 150–400)
RBC: 3.33 10*6/uL — ABNORMAL LOW (ref 3.85–5.22)
RDW-CV: 20.3 % — ABNORMAL HIGH (ref 11.5–15.5)
WBC: 10.4 10*3/uL (ref 3.7–11.0)

## 2021-02-16 LAB — VANCOMYCIN, RANDOM: VANCOMYCIN RANDOM: 20.3 ug/mL — ABNORMAL LOW (ref 30.0–40.0)

## 2021-02-16 LAB — PTT (PARTIAL THROMBOPLASTIN TIME)
APTT: 46.3 seconds — ABNORMAL HIGH (ref 24.2–37.5)
APTT: 43.9 seconds — ABNORMAL HIGH (ref 24.2–37.5)

## 2021-02-16 LAB — PHOSPHORUS: PHOSPHORUS: 5 mg/dL — ABNORMAL HIGH (ref 2.4–4.7)

## 2021-02-16 LAB — MAGNESIUM: MAGNESIUM: 2.1 mg/dL (ref 1.8–2.6)

## 2021-02-16 MED ORDER — MINERAL OIL ENEMA
133.0000 mL | ENEMA | Freq: Once | RECTAL | Status: AC
Start: 2021-02-16 — End: 2021-02-16
  Administered 2021-02-16: 133 mL via RECTAL

## 2021-02-16 MED ORDER — WARFARIN 2.5 MG TABLET
2.5000 mg | ORAL_TABLET | Freq: Once | ORAL | Status: AC
Start: 2021-02-16 — End: 2021-02-16
  Administered 2021-02-16: 2.5 mg via ORAL
  Filled 2021-02-16: qty 1

## 2021-02-16 MED ORDER — METOPROLOL TARTRATE 12.5 MG HALF TABLET
12.5000 mg | ORAL_TABLET | Freq: Two times a day (BID) | ORAL | Status: DC
Start: 2021-02-16 — End: 2021-02-20
  Administered 2021-02-16 – 2021-02-19 (×8): 12.5 mg via ORAL
  Filled 2021-02-16 (×8): qty 1

## 2021-02-16 MED ORDER — OXYCODONE 10 MG/0.5 ML ORAL SYRINGE (FOR ORAL USE ONLY)
10.0000 mg | INJECTION | ORAL | Status: DC | PRN
Start: 2021-02-16 — End: 2021-02-19
  Administered 2021-02-16 – 2021-02-19 (×14): 10 mg via ORAL
  Filled 2021-02-16 (×14): qty 0.5

## 2021-02-16 MED ORDER — SENNOSIDES 8.6 MG-DOCUSATE SODIUM 50 MG TABLET
2.0000 | ORAL_TABLET | Freq: Three times a day (TID) | ORAL | Status: DC
Start: 2021-02-17 — End: 2021-02-26
  Administered 2021-02-17 – 2021-02-21 (×14): 2 via ORAL
  Administered 2021-02-21 – 2021-02-22 (×2): 0 via ORAL
  Administered 2021-02-22: 2 via ORAL
  Administered 2021-02-22: 0 via ORAL
  Administered 2021-02-23 – 2021-02-24 (×4): 2 via ORAL
  Administered 2021-02-24: 0 via ORAL
  Administered 2021-02-24 – 2021-02-25 (×2): 2 via ORAL
  Administered 2021-02-25: 0 via ORAL
  Administered 2021-02-25 – 2021-02-26 (×2): 2 via ORAL
  Administered 2021-02-26 (×2): 0 via ORAL
  Filled 2021-02-16 (×24): qty 2

## 2021-02-16 MED ORDER — WARFARIN PLACEHOLDER
Freq: Every day | Status: DC | PRN
Start: 2021-02-16 — End: 2021-03-02

## 2021-02-16 MED ORDER — TRIAMCINOLONE ACETONIDE 0.1 % TOPICAL OINTMENT
TOPICAL_OINTMENT | Freq: Two times a day (BID) | CUTANEOUS | Status: DC
Start: 2021-02-16 — End: 2021-03-02
  Filled 2021-02-16 (×3): qty 15

## 2021-02-16 MED ORDER — CHLOROTHIAZIDE SODIUM 500 MG INTRAVENOUS SOLUTION
500.0000 mg | Freq: Once | INTRAVENOUS | Status: AC
Start: 2021-02-16 — End: 2021-02-16
  Administered 2021-02-16: 500 mg via INTRAVENOUS
  Filled 2021-02-16: qty 17.86

## 2021-02-16 MED ORDER — OXYCODONE 10 MG/0.5 ML ORAL SYRINGE (FOR ORAL USE ONLY)
5.0000 mg | INJECTION | ORAL | Status: DC | PRN
Start: 2021-02-16 — End: 2021-02-19

## 2021-02-16 MED ORDER — RIFAMPIN 300 MG CAPSULE
300.0000 mg | ORAL_CAPSULE | Freq: Three times a day (TID) | ORAL | Status: DC
Start: 2021-02-16 — End: 2021-02-16
  Filled 2021-02-16 (×2): qty 1

## 2021-02-16 MED ORDER — RIFAMPIN 300 MG CAPSULE
300.0000 mg | ORAL_CAPSULE | Freq: Two times a day (BID) | ORAL | Status: DC
Start: 2021-02-16 — End: 2021-03-02
  Administered 2021-02-16 – 2021-03-02 (×28): 300 mg via ORAL
  Filled 2021-02-16 (×32): qty 1

## 2021-02-16 MED ORDER — SODIUM CHLORIDE 0.9 % INTRAVENOUS SOLUTION
200.0000 mg | Freq: Once | INTRAVENOUS | Status: AC
Start: 2021-02-16 — End: 2021-02-16
  Administered 2021-02-16 (×2): 200 mg via INTRAVENOUS
  Administered 2021-02-16: 0 mg via INTRAVENOUS
  Filled 2021-02-16: qty 20

## 2021-02-16 MED ORDER — QUETIAPINE 50 MG TABLET
75.0000 mg | ORAL_TABLET | Freq: Every evening | ORAL | Status: DC
Start: 2021-02-16 — End: 2021-02-19
  Administered 2021-02-16 – 2021-02-18 (×3): 75 mg via ORAL
  Filled 2021-02-16 (×3): qty 1

## 2021-02-16 MED ORDER — TRIAMCINOLONE ACETONIDE 0.1 % TOPICAL CREAM
TOPICAL_CREAM | Freq: Two times a day (BID) | CUTANEOUS | Status: DC
Start: 2021-02-16 — End: 2021-02-16

## 2021-02-16 NOTE — Consults (Signed)
INFECTIOUS DISEASE FOLLOW UP CONSULTATION    Patient Name: [name] [name] Number: [id number]  Date of Service: 02/16/2021  Date of Birth: 01/09/1986    Hospital Day:  LOS: 11 days     Reason for Consultation: MRSA native tricuspid valve endocarditis     Subjective: [name] [name] denies any new complaints. Says overall she is having less chest tenderness. Reports having fevers but overall improved. Does have rash noted on lower extremities and upper arms.     Objective:  Physical Exam:  Current Vitals: BP (!) 141/101   Pulse 99   Temp 36.9 C (98.4 F)   Resp (!) 36   Ht 1.651 m (_0 )   Wt 65.6 kg (144 lb 10 oz)   SpO2 95%   BMI 24.07 kg/m       Vitals in last 24 hours: Temp  Avg: 37.3 C (99.1 F)  Min: 36.9 C (98.4 F)  Max: 37.8 C (100.1 F)  MAP (Non-Invasive)  Avg: 93.7 mmHG  Min: 80 mmHG  Max: 114 mmHG  Pulse  Avg: 96.4  Min: 92  Max: 101  Resp  Avg: 21.9  Min: 16  Max: 36  SpO2  Avg: 95 %  Min: 82 %  Max: 100 %  General: No acute distress.  Chronically ill- appearing.  Eyes: Pupils equal round and reactive bilaterally.  No conjunctival injection or scleral icterus.  ENT: Face symmetrical.  Mucous membranes pink and moist.  No oral candidiasis or mucosal ulcers. Edentulous.   Neck: Supple and symmetrical.  Lungs: Clear to auscultation bilaterally.  No wheezes, rhonchi, or crackles.  Unlabored respirations.  Cardiovascular: Regular rate and rhythm.  Sternal wound site clean, no cellulitis noted.   Abdomen: Normoactive bowel sounds.  Nondistended, soft, and nontender to palpation.    Extremities: No joint erythema or swelling. 1+ edema.  Skin: Warm and dry.  Macular rash noted to bilateral lower extremity and arms.   Neurologic: Alert and oriented X 3.   Moves all extremities without focal deficit.  Psychiatric: Appropriate affect and behavior.  Intact memory.  Fluent speech.     Inpatient Medications:  acetaminophen (TYLENOL) tablet, 650 mg, Oral, Q4H PRN  ascorbic acid (VITAMIN C) tablet, 500 mg,  Oral, 2x/day  aspirin chewable tablet 81 mg, 81 mg, Oral, Daily  B complex-vitamin C-folic acid (NEPHROCAPS) capsule, 1 Capsule, Oral, Daily  bisacodyl (DULCOLAX) rectal suppository, 10 mg, Rectal, Daily PRN  [Held by provider] furosemide (LASIX) 10 mg/mL injection, 80 mg, Intravenous, 2x/day  [Held by provider] heparin 25,000 units in 0.45% NS 250 mL infusion, 12 Units/kg/hr (Adjusted), Intravenous, Continuous  hydrALAZINE (APRESOLINE) injection 10 mg, 10 mg, Intravenous, Q4H PRN  HYDROmorphone (DILAUDID) 1 mg/mL injection, 0.2 mg, Intravenous, Q4H PRN  ipratropium (ATROVENT) 0.02% nebulizer solution, 0.5 mg, Nebulization, Q4H PRN  levalbuterol (XOPENEX) 1.25 mg/ 0.5 mL nebulizer solution, 1.25 mg, Nebulization, Q4H PRN  lidocaine-menthol (LIDOPATCH) 3.6%-1.25% patch, 1 Patch, Transdermal, Daily  magnesium hydroxide (MILK OF MAGNESIA) 449m per 514moral liquid, 30 mL, Oral, Daily PRN  melatonin tablet, 3 mg, Oral, NIGHTLY  metoprolol tartrate (LOPRESSOR) tablet, 12.5 mg, Oral, 2x/day  miconazole nitrate (SECURA) 2% topical cream, , Apply Topically, 2x/day  mupirocin (BACTROBAN) 2% topical ointment, , Apply Topically, 2x/day  NS flush syringe, 10-30 mL, Intracatheter, Q8HRS  NS flush syringe, 20-30 mL, Intracatheter, Q1 MIN PRN  NS flush syringe, 2-6 mL, Intracatheter, Q8HRS   And  NS flush syringe, 2-6 mL, Intracatheter, Q1 MIN PRN  ondansetron (  ZOFRAN) 2 mg/mL injection, 4 mg, Intravenous, Q8H PRN  oxyCODONE concentrate (ROXICODONE INTENSOL) 10 mg per 0.5 mL oral liquid, 10 mg, Oral, Q4H PRN   Or  oxyCODONE concentrate (ROXICODONE INTENSOL) 10 mg per 0.5 mL oral liquid, 5 mg, Oral, Q4H PRN  pantoprazole (PROTONIX) delayed release tablet, 40 mg, Oral, Daily before Breakfast  polyethylene glycol (MIRALAX) oral packet, 34 g, Oral, Daily  QUEtiapine (SEROQUEL) tablet 75 mg, 75 mg, Oral, NIGHTLY  sennosides-docusate sodium (SENOKOT-S) 8.6-50mg per tablet, 2 Tablet, Oral, 2x/day  sodium chloride 3% nebulizer solution, 3  mL, Nebulization, Q6H PRN  sodium citrate 4% (3 mL) injection syringe, 2 Syringe, Intracatheter, Q1H PRN  Vancomycin IV - Pharmacist to Dose per Protocol, , Does not apply, Daily PRN  Vancomycin IV Intermittent Dosing, , Does not apply, Daily PRN  warfarin (COUMADIN) tablet, 2.5 mg, Oral, Once 2100  Warfarin Placeholder, , Does not apply, Daily PRN        Current Antimicrobials:  Antibiotics (From admission, onward)      Start     Stop Route Frequency    02/10/21 1900  vancomycin (VANCOCIN) 500 mg in NS 100 mL IVPB         08/27 2353 IV ONCE    02/10/21 1016  Vancomycin IV Intermittent Dosing         -- N/A DAILY PRN    02/06/21 2000  vancomycin (VANCOCIN) 1 g in D5W 200 mL premix IVPB  Status:  Discontinued         08/27 1016 IV EVERY 24 HOURS    02/05/21 0226  Vancomycin IV Intermittent Dosing  Status:  Discontinued         08/23 1608 N/A DAILY PRN             Lines:  Patient Lines/Drains/Airways Status       Active Line / Dialysis Catheter / Dialysis Graft / Drain / Airway / Wound       Name Placement date Placement time Site Days    Peripheral IV Ultrasound guided;Extended dwell catheter Right Cephalic  (lateral side of arm);Forearm 02/12/21  0201  -- 4    Peripheral IV Ultrasound guided;Extended dwell catheter Anterior;Proximal;Right;Upper Arm 02/12/21  0204  -- 4    Midline Double Lumen Lumen 1 Purple Lumen 2 Red Left;Brachial Vein NOT A CENTRAL LINE 02/15/21  1700  4 FR  less than 1    Wound (Non-Surgical) Anterior;Right Elbow 02/05/21  0045  -- 11    Wound (Non-Surgical) Anterior;Right Finger 1;2;3;4;5 02/10/21  2000  -- 5    Wound (Non-Surgical) Anterior;Left Finger 1;2;3;4;5 02/10/21  2000  -- 5    Wound (Non-Surgical) Buttock 02/11/21  2000  -- 4    Surgical Incision Inner Mouth 02/09/21  --  -- 7    Surgical Incision Mediastinal Chest 02/13/21  --  -- 3    Surgical Incision Right Groin 02/13/21  --  -- 3                     Laboratory Studies:  CBC Differential   Recent Labs     02/14/21  0500  02/16/21  0327   WBC 10.8 10.4   HGB 9.1* 8.9*   HCT 28.2* 27.7*   PLTCNT 163 266    Recent Labs     02/14/21  0500 02/16/21  0327   PMNS 75 77   LYMPHOCYTES  --  7   MONOCYTES 11 11   EOSINOPHIL  --  2   BASOPHILS 1  <0.10 1  0.10   PMNABS 8.10* 8.01*   LYMPHSABS 1.21  --    MONOSABS 1.15* 1.14*   EOSABS <0.10 0.21      BMP LFTs   Recent Labs     02/15/21  2208 02/16/21  0327   SODIUM  --  128*   POTASSIUM  --  4.2   CHLORIDE  --  98   CO2  --  22   BUN  --  31*   CREATININE  --  3.12*   GLUCOSENF  --  91   ANIONGAP  --  8   BUNCRRATIO  --  10   GFR  --  19*   CALCIUM  --  7.3*   MAGNESIUM  --  2.1   PHOSPHORUS  --  5.0*   ALBUMIN 1.5*  --     Recent Labs     02/15/21  2208   TOTALPROTEIN 6.9   ALBUMIN 1.5*   TOTBILIRUBIN 0.5   BILIRUBINCON 0.3   AST 17   ALT 6*   ALKPHOS 91      CoAgs Blood Gas:   Recent Labs     02/16/21  0327   APTT 46.3*    No results found for this encounter    Cardiac Markers Lipid Panel   No results for input(s): TROPONINI, CKMB, MBINDEX, BNP in the last 72 hours. No results found for this encounter   Urine Analysis Other Labs   No results found for this encounter No results found for this encounter    Invalid input(s): PRL     Microbiology:  Hospital Encounter on 02/05/21 (from the past 96 hour(s))   ADULT ROUTINE BLOOD CULTURE, SET OF 2 ADULT BOTTLES (BACTERIA AND YEAST)    Collection Time: 02/13/21  1:15 AM    Specimen: Blood   Culture Result Status    BLOOD CULTURE, ROUTINE Abnormal Stain (AA) Final    BLOOD CULTURE, ROUTINE Methicillin Resistant Staphylococcus aureus (A) Final    GRAM STAIN Gram Positive Cocci/Clusters (A) Final    GRAM STAIN Gram Positive Cocci/Clusters (A) Final       Susceptibility    Methicillin Resistant Staphylococcus aureus - MIC SUSCEPTIBILITY     Oxacillin >=4 Resistant mcg/mL     Gentamicin* <=0.5 Sensitive mcg/mL      * The role of gentamicin for the treatment of MSSA and MRSA infections is in combination with a cell-wall acting agent in the setting of  prosthetic valve endocarditis.     Erythromycin >=8 Resistant mcg/mL     Clindamycin 0.25 Sensitive mcg/mL     Daptomycin 0.5 Sensitive mcg/mL     Tetracycline <=1 Sensitive mcg/mL     Doxycycline <=0.5 Sensitive mcg/mL     Linezolid 2 Sensitive mcg/mL     Vancomycin 1 Sensitive mcg/mL     Rifampin* <=0.5 Sensitive mcg/mL      * Rifampin should not be used alone for the treatment of Staphylococcus due to the quick emergence of resistance. For use in combination with other active agents for prosthetic material biofilm penetration.      Trimethoprim/Sulfamethoxazole <=10 Sensitive mcg/mL   ADULT ROUTINE BLOOD CULTURE, SET OF 2 ADULT BOTTLES (BACTERIA AND YEAST)    Collection Time: 02/13/21  1:15 AM    Specimen: Blood   Culture Result Status    BLOOD CULTURE, ROUTINE Abnormal Stain (AA) Final    BLOOD CULTURE, ROUTINE Methicillin Resistant Staphylococcus aureus (A) Final  GRAM STAIN Gram Positive Cocci/Clusters (A) Final    Narrative    For susceptibility, see previous report.     OR CULTURE(AEROBIC AND GRAM STAIN)    Collection Time: 02/13/21  9:14 AM    Specimen: Tissue   Culture Result Status    FLC (A) Final     4+ Many Methicillin Resistant Staphylococcus aureus    GRAM STAIN 3+ Several PMNs (A) Final    GRAM STAIN 4+ Many Gram Positive Cocci (A) Final       Susceptibility    Methicillin Resistant Staphylococcus aureus - MIC SUSCEPTIBILITY     Oxacillin >=4 Resistant mcg/mL     Gentamicin* <=0.5 Sensitive mcg/mL      * The role of gentamicin for the treatment of MSSA and MRSA infections is in combination with a cell-wall acting agent in the setting of prosthetic valve endocarditis.     Erythromycin >=8 Resistant mcg/mL     Clindamycin 0.25 Sensitive mcg/mL     Daptomycin 0.5 Sensitive mcg/mL     Tetracycline <=1 Sensitive mcg/mL     Doxycycline <=0.5 Sensitive mcg/mL     Linezolid 2 Sensitive mcg/mL     Vancomycin 1 Sensitive mcg/mL     Tigecycline <=0.12 Sensitive mcg/mL     Rifampin* <=0.5 Sensitive mcg/mL       * Rifampin should not be used alone for the treatment of Staphylococcus due to the quick emergence of resistance. For use in combination with other active agents for prosthetic material biofilm penetration.      Trimethoprim/Sulfamethoxazole <=10 Sensitive mcg/mL   BRONCHOALVEOLAR LAVAGE (BAL) QUANTITATIVE CULTURE/GRAM STAIN    Collection Time: 02/13/21  3:37 PM    Specimen: Bronchoalveolar Lavage; Other   Culture Result Status    BRONCHOALVEOLAR LAVAGE (BAL) QUANTITATIVE CULTURE (A) Final     >100000 Methicillin Resistant Staphylococcus aureus    GRAM STAIN 3+ Several PMNs Final    GRAM STAIN 2+ Few Gram Positive Cocci/Clusters Final       Susceptibility    Methicillin Resistant Staphylococcus aureus - MIC SUSCEPTIBILITY     Oxacillin >=4 Resistant mcg/mL     Gentamicin* <=0.5 Sensitive mcg/mL      * The role of gentamicin for the treatment of MSSA and MRSA infections is in combination with a cell-wall acting agent in the setting of prosthetic valve endocarditis.     Erythromycin >=8 Resistant mcg/mL     Clindamycin 0.25 Sensitive mcg/mL     Tetracycline <=1 Sensitive mcg/mL     Doxycycline <=0.5 Sensitive mcg/mL     Linezolid 2 Sensitive mcg/mL     Vancomycin 1 Sensitive mcg/mL     Tigecycline <=0.12 Sensitive mcg/mL     Rifampin* <=0.5 Sensitive mcg/mL      * Rifampin should not be used alone for the treatment of Staphylococcus due to the quick emergence of resistance. For use in combination with other active agents for prosthetic material biofilm penetration.      Trimethoprim/Sulfamethoxazole <=10 Sensitive mcg/mL   ADULT ROUTINE BLOOD CULTURE, SET OF 2 ADULT BOTTLES (BACTERIA AND YEAST)    Collection Time: 02/14/21  5:14 AM    Specimen: Blood   Culture Result Status    BLOOD CULTURE, ROUTINE Abnormal Stain (AA) Final    BLOOD CULTURE, ROUTINE Methicillin Resistant Staphylococcus aureus (A) Final    GRAM STAIN Gram Positive Cocci/Clusters (A) Final    Narrative    For susceptibility, see previous report.      ADULT ROUTINE BLOOD CULTURE,  SET OF 2 ADULT BOTTLES (BACTERIA AND YEAST)    Collection Time: 02/14/21  5:14 AM    Specimen: Blood   Culture Result Status    BLOOD CULTURE, ROUTINE Abnormal Stain (AA) Final    BLOOD CULTURE, ROUTINE Methicillin Resistant Staphylococcus aureus (A) Final    GRAM STAIN Gram Positive Cocci/Clusters (A) Final    GRAM STAIN Gram Positive Cocci/Clusters (A) Final    Narrative    For susceptibility, see previous report.     ADULT ROUTINE BLOOD CULTURE, SET OF 2 ADULT BOTTLES (BACTERIA AND YEAST)    Collection Time: 02/15/21  8:06 AM    Specimen: Blood   Culture Result Status    BLOOD CULTURE, ROUTINE Abnormal Stain (AA) Preliminary    GRAM STAIN Gram Positive Cocci/Clusters (A) Preliminary   ADULT ROUTINE BLOOD CULTURE, SET OF 2 ADULT BOTTLES (BACTERIA AND YEAST)    Collection Time: 02/15/21  8:06 AM    Specimen: Blood   Culture Result Status    BLOOD CULTURE, ROUTINE Abnormal Stain (AA) Preliminary    GRAM STAIN Gram Positive Cocci/Clusters (A) Preliminary       No results found for any visits on 02/05/21 (from the past 24 hour(s)).    Imaging Studies:  Results for orders placed or performed during the hospital encounter of 02/05/21 (from the past 72 hour(s))   XR CHEST AP MOBILE     Status: None    Narrative    [place]  Female, 35 years old.    XR AP MOBILE CHEST performed on 02/13/2021 1:43 PM.    REASON FOR EXAM:  Immediate Post-Op Exam - evaluation for effusion    TECHNIQUE: 1 views/1 images submitted for interpretation.    COMPARISON:  02/11/2021    FINDINGS:    The heart is upper limits in size. Pulmonary vasculature is within normal limits.   Endotracheal tube tip lies approximately 2 cm above the carina. Nasogastric tube extends below the left hemidiaphragm. Dual lumen right internal jugular vein central catheter tip is projected over the middle third superior vena cava.  There is no pneumothorax. Cavitary mass right lower lung zone and left suprahilar unchanged. Tiny right  effusion and atelectasis.  Bone density is borderline. Disc desiccation thoracic spine.     Impression    Tiny right pleural effusion. Postoperative changes.Interval median sternotomy and placement of nasogastric tube, endotracheal tube and replaced right central line.     XR AP MOBILE CHEST     Status: None    Narrative    Gael Pattison  Female, 35 years old.    XR AP MOBILE CHEST performed on 02/14/2021 5:00 AM.  _________________________________  REASON FOR EXAM:  Status-Post Cardiothoracic Surgery - evaluation for effusion    TECHNIQUE: 1 views/1 images submitted for interpretation.    COMPARISON:  Prior chest 02/13/2021  _________________________________  FINDINGS:    A right IJ sheath is unchanged with its tip over the mid SVC. The endotracheal tube and enteric line have been removed since the prior exam. The heart is enlarged and there is persistent vascular congestion as well as bibasilar fluid and atelectasis/consolidation. Airspace consolidation appears slightly more prominent on the previous exam. The cavitary nature of several of the nodular foci of consolidation is better appreciated on prior CT. Prior median sternotomy is noted. There is no pneumothorax.   _________________________________    Impression    1.Persistent patchy nodular areas of consolidation bilaterally with small bilateral pleural effusions and bibasilar atelectasis/consolidation, slightly more prominent  than on the prior examination.  2.Stable cardiac enlargement and mild central vascular congestion.   XR AP MOBILE CHEST     Status: None    Narrative    Brileigh Lowman  Female, 35 years old.    XR AP MOBILE CHEST performed on 02/15/2021 5:10 AM.    REASON FOR EXAM:  Status-Post Cardiothoracic Surgery - evaluation for effusion    TECHNIQUE: 1 view/1 image(s) submitted for interpretation.    FINDINGS: Compared to 02/14/2021. There is a shallow inspiration. The heart is stable in size and there are postoperative changes identified. Patchy  bilateral nodules and cavities as well as airspace opacities are noted. There is no visible pneumothorax. Chest tubes remain in place. Right sheath has been removed.        Impression    Stable lung aeration.     XR ABD SUPINE     Status: None    Narrative    Retha Lamarre  Female, 35 years old.    XR ABD SUPINE performed on 02/15/2021 3:00 PM.    REASON FOR EXAM:  nausea    TECHNIQUE: 1 views/2 images submitted for interpretation.    COMPARISON:  Abdominal radiographs, CT chest abdomen pelvis 02/05/2021    FINDINGS:  There is moderate gaseous distention of multiple loops of small bowel throughout the central abdomen. No significant stool burden is appreciated. Median sternotomy wires are evident, with questioned small amount of pneumomediastinum. This is in keeping with patient's recent surgical procedure. Interval removal of multiple chest tubes. No acute bony abnormality is identified.      Impression    Moderate gaseous distention multiple loops of small bowel in the central abdomen. Bowel gas pattern consistent with ileus. Close attention to follow-up is recommended.       Results for orders placed or performed during the hospital encounter of 02/05/21 (from the past 24 hour(s))   XR ABD SUPINE     Status: None    Narrative    [place]  Female, 35 years old.    XR ABD SUPINE performed on 02/15/2021 3:00 PM.    REASON FOR EXAM:  nausea    TECHNIQUE: 1 views/2 images submitted for interpretation.    COMPARISON:  Abdominal radiographs, CT chest abdomen pelvis 02/05/2021    FINDINGS:  There is moderate gaseous distention of multiple loops of small bowel throughout the central abdomen. No significant stool burden is appreciated. Median sternotomy wires are evident, with questioned small amount of pneumomediastinum. This is in keeping with patient's recent surgical procedure. Interval removal of multiple chest tubes. No acute bony abnormality is identified.      Impression    Moderate gaseous distention multiple loops  of small bowel in the central abdomen. Bowel gas pattern consistent with ileus. Close attention to follow-up is recommended.       Assessment:    This is a 35 y.o. female with a history of tobacco use, cleared hepatitis-C infection, nephrolithiasis and active injection drug use who was transferred to [facility] from [facility] on February 05, 2021 with MRSA native tricuspid valve endocarditis.  She presented to [facility] on January 31, 2021 with altered mental status, and was found to be in renal failure.  She was intubated and started on hemodialysis.  Blood and sputum cultures that grew MRSA.  On transfer to [place], a transthoracic echocardiogram confirmed a tricuspid valve vegetation.  CT of the chest shows bilateral airspace opacities some of which are cavitary likely representing  septic emboli.  Blood cultures have remained persistently positive for MRSA.  She is now on intermittent HD.  MRI's of the spine without contrast showed no evidence of discitis or epidural abscess. She is s/p TV replacement and primary closure of PFO. OR cultures positive for MRSA as well. Repeated blood cultures pending from 8/31 and  02/15/21 remain positive. HD catheter has been removed.      Recommendations:    - Continue pharmacy to dose IV vancomycin per protocol x 6 weeks from negative blood cultures  - Please add rifampin 300 mg BID   - Agree with repeated blood cultures every 48 hours until negative.  - Recommend obtaining dermatology consult for rash on her lower extremities and arms.   - Patient will transition to MH-7 once she is stable, Will consider CoPAT therapy after 2-4 weeks of IV antibiotics.    - Please continue to follow CBC with differential, BUN, creatinine, hepatic function panel, and CRP while the patient remains on antimicrobial therapy.    ID will follow peripherally.  Please call or page the Infectious Diseases Service with any questions regarding this patient.    I independently of the  faculty provider spent a total of (20) minutes in direct/indirect care of this patient including initial evaluation, review of laboratory, radiology, diagnostic studies, review of medical record, order entry and coordination of care.    Rennis Chris, APRN, NP-C  Infectious Diseases     I personally saw and evaluated the patient as part of a shared service with an APP.      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     Delvina Mizzell R. Gloriajean Dell MD  Pager [phone]

## 2021-02-16 NOTE — Care Management Notes (Signed)
[facility]  Care Management Note    Patient Name: [name] [name]  Date of Birth: [birth year]  Sex: female  Date/Time of Admission: 02/05/2021 12:34 AM  Room/Bed: 19/A  Payor: [place] MEDICAID / Plan: [place] MEDICAID / Product Type: Medicaid [facility] /    LOS: 11 days   Primary Care Providers:  Pcp, No (General)    Admitting Diagnosis:  Endocarditis [I38]    Assessment:      02/16/21 1228   Assessment Detail   Assessment Type Continued Assessment   Date of Care Management Update 02/16/21   Date of Next DCP Update 02/20/21   Social Work Plan   Discharge Planning Status plan in progress   Projected Discharge Date 02/23/21   CM will evaluate for rehabilitation potential yes   Discharge Needs Assessment   Discharge Facility/Level of Care Needs Home (Patient/Family Member/other)(code 1)   Transportation Available car;family or friend will provide       Discharge Plan:  Home (Patient/Family Member/other) (code 1)  Per chart review, POD#3 s/p TVR. Pt is on suicide precautions. ID and Nephrology following. Pt with history of IVDU, and TV endocarditis; pt is currently on IV vancomycin, end date TBD. Blood cultures ordered today. Pt is on room air. Dietitian following; pt is on a renal diet. PT/OT following; PT/OT evaluated the pt on 9/1 and recommend home with assist, no DME anticipated at d/c. Anticipate pt to d/c home, likely with no needs, once medically stable. MSW Jearld Shines will continue to follow.     The patient will continue to be evaluated for developing discharge needs.     Case Manager: Elease Hashimoto, [place]  Phone: [phone]

## 2021-02-16 NOTE — Consults (Signed)
SAFE-T Protocol with C-SSRS ([place] Risk and Protective Factors) - Recent    Step 1: Identify Risk Factors                                                   C-SSRS Suicidal Ideation Severity Month   1) Wish to be dead  Have you wished you were dead or wished you could go to sleep and not wake up? no   2) Current suicidal thoughts  Have you actually had any thoughts of killing yourself? no     3) Suicidal thoughts w/ Method (w/no specific Plan or Intent or act)  Have you been thinking about how you might do this? no   4) Suicidal Intent without Specific Plan  Have you had these thoughts and had some intention of acting on them? no   5) Intent with Plan  Have you started to work out or worked out the details of how to kill yourself? Do you intend to carry out this plan? no   C-SSRS Suicidal Behavior: "Have you ever done anything, started to do anything, or prepared to do anything to end your life?"    Examples: Collected pills, obtained a gun, gave away valuables, wrote a will or suicide note, took out pills but didn't swallow any, held a gun but changed your mind or it was grabbed from your hand, went to the roof but didn't jump; or actually took pills, tried to shoot yourself, cut yourself, tried to hang yourself, etc.  If "YES" Was it within the past 3 months? Lifetime    no    Past 3 Months    no   Activating Events:     Recent losses or other significant negative event(s) (legal, financial, relationship, etc.)    Treatment History:     denies    Other:     Clinical Status:     Chronic physical pain or other acute medical problem (e.g. CNS disorders)       Access to lethal methods: Ask specifically about presence or absence of a firearm in the home or ease of accessing:    no     Step 2: Identify Protective Factors (Protective factors may not counteract significant acute suicide risk factors)   Internal:     Identifies reasons for living External:     {Responsibility to family or others; living with family and  Supportive social network of family or friends     Step 3: Specific questioning about Thoughts, Plans, and Suicidal Intent - (see Step 1 for Ideation Severity and Behavior)   C-SSRS Suicidal Ideation Intensity (with respect to the most severe ideation 1-5 identified above) Month   Frequency  How many times have you had these thoughts?  0   Duration  When you have the thoughts how long do they last? 0   Controllability  Could/can you stop thinking about killing yourself or wanting to die if you want to? 0   Deterrents  Are there things - anyone or anything (e.g., family, religion, pain of death) - that stopped you from wanting to die or acting on thoughts of suicide? 0: does not apply    Reasons for Ideation  What sort of reasons did you have for thinking about wanting to die or killing yourself?  Was it to end the pain or  stop the way you were feeling (in other words you couldn't go on living with this pain or how you were feeling) or was it to get attention, revenge or a reaction from others? Or both? 0: does not apply    Total Score                    0         Step 4: Guidelines to Determine Level of Risk and Develop Interventions to LOWER Risk Level  "The estimation of suicide risk, at the culmination of the suicide assessment, is the quintessential clinical judgment, since no study has identified one specific risk factor or set of risk factors as specifically predictive of suicide or other suicidal behavior."   From The American Psychiatric Association Practice Guidelines for the Assessment and Treatment of Patients with Suicidal Behaviors, page 24.   RISK STRATIFICATION TRIAGE   High Suicide Risk  Suicidal ideation with intent or intent with plan in past month (C-SSRS Suicidal Ideation #4 or #5)  Or  Suicidal behavior within past 3 months (C-SSRS Suicidal Behavior)   Initiate local psychiatric admission process  Stay with patient until transfer to higher level of care is complete  Follow-up and document  outcome of emergency psychiatric evaluation     Moderate Suicide Risk  Suicidal ideation with method, WITHOUT plan, intent or behavior       in past month (C-SSRS Suicidal Ideation #3)  Or  Suicidal behavior more than 3 months ago (C-SSRS Suicidal Behavior Lifetime)  Or  Multiple risk factors and few protective factors Directly address suicide risk, implementing suicide     prevention strategies  Develop Safety Plan     Low Suicide Risk  Wish to die or Suicidal Ideation WITHOUT method, intent, plan or behavior (C-SSRS Suicidal Ideation #1 or #2)   Or  Modifiable risk factors and strong protective factors  Or  No reported history of Suicidal Ideation or Behavior  Discretionary Outpatient Referral     Step 5: Documentation   Risk Level :  Low Suicide Risk:  Discretionary Outpatient Referral     Clinical Note: Patient was sitting up in bed, alert and oriented.  Patient was open to speaking with therapist to complete Safe-T assessment.  Patient denies recent or current suicidal thoughts.  Patient denies previous suicide attempts.  Patient identified her children and her mother as primary supports.      Your Clinical Observation  Relevant Mental Status Information Mood: stable, affect: neutral, eye contact: good.  Thought process: linear, goal oriented, thought content: devoid of suicidal thoughts.  Speech: normal rate rhythm and volume.  Methods of Suicide Risk Evaluation: Patient interview, chart review, CSSRS assessment    Brief Evaluation Summary  Warning Signs: hopelessness, severe depression, helplessness  Risk Indicators: substance use, current health issues  Protective Factors: identified family as supportive, responsibility toward her children  Access to Lethal Means: No  Collateral Sources Used and Relevant Information Obtained: Patient interview  Specific Assessment Data to Support Risk Determination: CSSRS Score=0 low suicide risk  Rationale for Actions Taken and Not Taken: Patient denies recent or current  suicidal ideation, denies previous attempts.  Patient is able to identify positive supports in her life and feels responsible for her children.  CSSRS Score is 0 which indicates low suicide risk.  Neither CV nor CVT monitoring is warranted at this time.      Dillon Bjork, LGSW

## 2021-02-16 NOTE — Care Plan (Signed)
Medical Nutrition Therapy Follow Up        SUBJECTIVE : Calorie count completed - patient consumed less than 20% of needs over three day period. Speech therapy following. Receiving mechanical ground foods. Poor po intake not suspected to be related to dysphagia but instead poor appetite. Nephrology following. Last HD 8/31. Bowel regimen increased yesterday, last BM 9/1 (yesterday).                                                                                                                                                   OBJECTIVE:     Current Diet Order/Nutrition Support:   MNT PROTOCOL FOR DIETITIAN  DIETARY ORAL SUPPLEMENTS Oral Supplements with tray: Mighty Shake-Chocolate; LUNCH/DINNER; 1 Each; Supplement Ordered To Be Given: ABOVE Restrictions of Fluid Restriction of Diet Order  DIETARY ORAL SUPPLEMENTS Oral Supplements with tray: Ensure Clear-Apple; BREAKFAST/LUNCH; 1 Each  MNT PROTOCOL FOR DIETITIAN  DIET RENAL (60G PRO,2G NA,2G K) Consistency/thickening: MECHANICAL SOFT (Ground meat/Soft foods)     Height Used for Calculations: 165.1 cm ('5\' 5"'$ )  Weight Used For Calculations: 72.4 kg (159 lb 9.8 oz)  BMI (kg/m2): 26.62     Ideal Body Weight (IBW) (kg): 57.29  % Ideal Body Weight: 126.37  Adjusted/Standard Body Weight  Adjusted BW: 61 kg        Estimated Needs:    Energy Calorie Requirements: 1650-1950 kcal per day (25-30 kcals/65.6 kg)  Protein Requirements (gms/day): 57 g protein per day (1.0 g/57.3 kg) - AKI  Fluid Requirements: 1650-1950 ml or per MD based on renal function.    Comments: Pt admitted as a transfer from [facility]. Per chart review, pt remains intubated, on ventilator support, and in restraints. Cardiac Surgery evaluating for surgical intervention. 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       Ref. Range 02/13/2021 01:14 02/14/2021 05:00 02/14/2021 16:23 02/15/2021 00:35 02/16/2021 03:27   PHOSPHORUS Latest Ref Range: 2.4 - 4.7 mg/dL 3.1 6.8 (H) 2.3 (L) 3.8 5.0 (H)       Ref. Range 02/13/2021 12:07 02/14/2021 05:00 02/14/2021 16:23 02/15/2021 00:35 02/16/2021 03:27   BUN Latest Ref Range: 8 - 25 mg/dL 27 (H) 36 (H) '11 16 31 '$ (H)   CREATININE Latest Ref Range: 0.60 - 1.05 mg/dL 3.08 (H) 3.52 (H) 1.44 (H) 2.31 (H) 3.12 (H)     Plan/Interventions :   Continue current diet, encourage po intake at meal times. If appetite does not improve recommend liberalizing diet to regular with texture modifications per speech therapy.  Continue supplements to promote oral intake - Mighty Shake TID and Ensure Clear BID. Monitor renal function and need for adjustment of supplements.  Trend weights.  Monitor renal function/mode of therapy and adjust needs as medically able.  Continue daily nephronex.  Consider addition of phos binder.  RD to continue to  follow.        Nutrition Diagnosis: Inability to swallow related to Patient on vent as evidenced by Need for TF (resolved)    Nutrition Diagnosis: Inadequate protein-energy intake related to decreased appetite as evidenced by po intake 25% or less of meals.        Melody Comas, RDLD  02/16/2021, 10:44  Pager [phone]
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
## 2021-02-16 NOTE — Ancillary Notes (Signed)
Visited patient for ambulation, did not complete ambulation as patient was with another service.       Will follow-up with patient for further cardiac rehab services.    Therma Lasure, MS, EP  Cardiac Rehabilitation  #[phone]

## 2021-02-16 NOTE — Care Plan (Signed)
[place]  Physical Therapy Progress Note      Patient Name: [name] [name]  Date of Birth: October 04, 1985  Height:  165.1 cm ('5\' 5"'$ )  Weight:  65.6 kg (144 lb 10 oz)  Room/Bed: 19/A  Payor: [place] MEDICAID / Plan: [employer] [place] MEDICAID / Product Type: Medicaid [place] /     Assessment:     pt presents with good tolerance to increased mobiliity. CGA-min assist for mobility. Anticipate home with assist.    Discharge Needs:   Equipment Recommendation: none anticipated  Discharge Disposition: home with assist    JUSTIFICATION OF DISCHARGE RECOMMENDATION   Based on current diagnosis, functional performance prior to admission, and current functional performance, this patient requires continued PT services in home with assist in order to achieve significant functional improvements in these deficit areas: aerobic capacity/endurance, gait, locomotion, and balance, muscle performance.    Plan:   Continue to follow patient according to established plan of care.  The risks/benefits of therapy have been discussed with the patient/caregiver and he/she is in agreement with the established plan of care.     Subjective & Objective:        02/15/21 0943   Rehab Session   Document Type therapy progress note (daily note)   Total PT Minutes: 15   Patient Effort good   Symptoms Noted During/After Treatment shortness of breath   General Information   Patient/Family/Caregiver Comments/Observations pt in bed upon arrival; agreeable to PT   Medical Lines PIV Line;Telemetry   Respiratory Status room air   Existing Precautions/Restrictions fall precautions   Mutuality/Individual Preferences   Individualized Care Needs OOB with assist x 2   Vital Signs   Pre-Treatment Heart Rate (beats/min) 101   Post-treatment Heart Rate (beats/min) 104   Pre SpO2 (%) 95   O2 Delivery Pre Treatment room air   Post SpO2 (%) 97   O2 Delivery Post Treatment room air   Pain Assessment   Pre/Posttreatment Pain Comment no c/o pain    Transfer Assessment/Treatment   Sit-Stand [place] minimum assist (75% patient effort)   Stand-Sit [place] contact guard assist   Sit-Stand-Sit, Assist Device side by side   Transfer Comment trunk assist from upright   Gait Assessment/Treatment   [place]ependence  contact guard assist   Assistive Device  side by side   Distance in Feet 150 x 2 with seated rest break   Impairments  balance impaired;endurance;flexibility decreased;strength decreased   Balance Skill Training   Sitting Balance: Static fair + balance   Sitting, Dynamic (Balance) fair balance   Sit-to-Stand Balance fair balance   Standing Balance: Static fair balance   Standing Balance: Dynamic fair - balance   Post Treatment Status   Post Treatment Patient Status Patient sitting in bedside chair or w/c;Call light within reach;Telephone within reach;Sitter select activated   Support Present Post Treatment  Family present   Communication Post Treatement Nurse   Plan of Care Review   Plan Of Care Reviewed With patient   Physical Therapy Clinical Impression   Assessment pt presents with good tolerance to increased mobiliity. CGA-min assist for mobility. Anticipate home with assist.   Impairments Found (describe specific impairments) aerobic capacity/endurance;gait, locomotion, and balance;muscle performance   Anticipated Equipment Needs at Discharge (PT) none anticipated   Anticipated Discharge Disposition home with assist       Therapist:   Karie Georges, PT   Pager #: [phone]

## 2021-02-16 NOTE — Care Plan (Signed)
Ambulating, pain management, stool softeners, antibiotics

## 2021-02-16 NOTE — Progress Notes (Signed)
[facility]  HVI Critical Care Consult/Progress Note     [name] [name], Caputo, 35 y.o. female  Date of Birth: 03-12-1986  Medical Record Number:  [id number]   Inpatient Admission Date: 02/05/2021   Hospital Day:  LOS: 0 days      Chief Complaint: Altered Mental Status   History of Present Illness: Per previous providers: "Pt is a 35 yo female with a significant PMH for Smoking and IVDU presented to [name] with AMS on 01/31/21. She was found to have a drug overdose, aspiration PNA requiring intubation, MRSA bacteremia, large TV endocarditis with moderate TR and elevated systemic pulmonary pressures (40mhg), AKI requiring HD, anemia requiring PRBC and thrombocytopenia. Urine drug screen + for heroin and methamphetamines.     [facility]."   POD #/ Procedure: [facility]22[place] [facility]  8/30: On pump beating heart TVR (Epic 33)  PFO colsure   Hospital Course/Major events:  8/22: See above.   -Prop for sedation. Agitated; MAE; does not follow. Repeat CT Head/CAP. Hemodynamics stable. Intubated. Repeated blood cultures. Continue Vanc.   8/23: had pan CT 8/22, still not awakening/folowing commands on precedex  But agitated. MRI brain ordered, started on CRRT  8/24: remains on CRRT, MRI Brain no abnormality, following commands on dexmedetomidine, quetiapine, prn dilaudid  8/25 brocnh today for increased ETT secretions,plan for OR early next week, OMFS following for dental root extriction.  Pt self-extubated this afternoon.  Doing well on NC.  8/26: Intermittent delirium. Weak. Continue Seroquel.  Hemodynamics stable. NC. Aggressive pulm toilet. Continue nebs. Speech to see. Dental procedure today. Continues + BC. ID following; MRI CTLS ordered. Continue antibiotics. Surgical plan TBA.   8/27:  MRI completed today. Intermittent dex. Surgical plan TBA. HD today per nephology. Bedside speech and swallow. Blood  repeat Cx tomorrow.   8/28 repeat TTE today for OR later this week, HD holiday will give 200 mg Lasix for diuresis (followed by diuril 5067mif no response w/lasix) and D/C [place] for line holiday  8/29: OR tomorrow. Agitation. Seroquel. Hemodynamics stable. HD today prior to surgery. New line placed 2/2 line holiday. 2 u PRBC pre op optimization.   8/30: OR for TVR, PFO closure, out of OR intubated, levophed infusing, normal EF  8/31: OBT, deline, continue CT, HD today, repeat BC  9/1: hypertensive; started lopressor 6.2576mID, repeat BC, lasix 80 IV BID started, holding HD, repeat BC   9/2: Decrease Seroquel, increase BB, d/c pacer wires and groin bumper, lasix 200, Diuril 500, place trialysis line, give 1st dose coumadin tonight, repeat BC    Past Medical History: No past medical history on file.    Past Surgical History: No past surgical history on file.   Social History:  Social History            Socioeconomic History    Marital status: Divorced       Spouse name: Not on file    Number of children: Not on file    Years of education: Not on file    Highest education level: Not on file   Occupational History    Not on file   Tobacco Use    Smoking status: Not on file    Smokeless tobacco: Not on file   Substance and Sexual Activity    Alcohol use: Not on file    Drug use: Not on file    Sexual activity:  Not on file   Other Topics Concern    Not on file   Social History Narrative    Not on file      Social Determinants of Health      Financial Resource Strain: Not on file   Food Insecurity: Not on file   Transportation Needs: Not on file   Physical Activity: Not on file   Stress: Not on file   Intimate Partner Violence: Not on file   Housing Stability: Not on file       Family History: Family Medical History:       None                Allergies: No Known Allergies   ROS: Continue to have improved pain and shortness of breath   Minimal urge to urinate   + abdominal bloating declines any abdominal pain, nausea, or  vomiting       Problem List:       Patient Active Problem List   Diagnosis    Endocarditis         Medications: Reviewed      Last VS:    .  Filed Vitals:    02/16/21 0300 02/16/21 0400 02/16/21 0500 02/16/21 0900   BP: 132/76 (!) 140/74 (!) 141/101    Pulse: 98 94 99    Resp: 16 17 (!) 36    Temp:  37 C (98.6 F)  36.9 C (98.4 F)   SpO2: 94% 96% 95%         Physical Exam  Vitals reviewed.   Constitutional:       Appearance: She is ill-appearing.      Comments: Critically ill appearing female, no acute distress, becomes restless at times   HENT:      Head: Normocephalic.      Mouth/Throat:      Mouth: Mucous membranes are moist.   Eyes:      Extraocular Movements: Extraocular movements intact.      Conjunctiva/sclera: Conjunctivae normal.      Pupils: Pupils are equal, round, and reactive to light.   Cardiovascular:      Rate and Rhythm: Regular rhythm.      Pulses: Normal pulses.   Pulmonary:      Comments: Diminished breath sounds throughout, shallow inspiratory effort    Abdominal:      General: Abdomen is flat. There is no distension.      Palpations: Abdomen is soft. There is no mass.      Hernia: No hernia is present.      Comments: Hypoactive bowel sounds   Musculoskeletal:         General: Swelling present. Normal range of motion.      Cervical back: No rigidity.      Right lower leg: Edema present.      Left lower leg: Edema present.   Skin:     General: Skin is warm and dry.   Neurological:      General: No focal deficit present.      Mental Status: She is alert and oriented to person, place, and time.   Psychiatric:      Comments: Anxious              _0 I have reviewed the patient's vitals signs, the nursing notes, the physician's notes and other progress notes      _1 I have reviewed the laboratory and imaging data     _2 I have discussed this case  with CCM team, SCT         Systems Based Assessment and Plan:     Neurologic:  Data/Assessment Reviewed    Diagnosis Altered mental status 2/2  encephalopathy   Overdose 2/2 IVDU  IVD withdrawal  Back pain    Course: Critical, improving    Plan Decrease oral oxy 5 and 10 mg  Decreased qHS Seroquel   Pain/Psych consult   MRI Brain 8/23 negative  MRI of the cervical, thoracic, and lumbosacral spine completed to r/o abscess; negative   Day/night policy, reorientation as needed  PT/OT/Cardiac rehab   Serial neuro checks        Cardiovascular:  Data/Assessment Most Recent Hemodynamics: Reviewed   Cardiovascular support: N/A       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; s/p PFO closure  Moderate TR  Mild Pulmonary HTN  Hypertension    Course Improving    Plan Increase lopressor 12.12m BID; titrate as tolerated   Start coumadin tonight   D/C Temp wires after heparin gtt on hold for 4hrs   Continue heparin gtt per SCT   Continue ASA       Respiratory:  Data/Assessment: CXR: Reviewed   Most recent VBG: reviewed  Vent Settings: NA      Diagnosis Acute respiratory failure 2/2 Aspiration PNA and altered mental status  Septic pulmonary emboli  Smoker  MRSA PNA   Mechanical ventilation post op      Course stable   Plan F/U BAL from 8/30 + MRSA   FIO2 wean as tolerated for O2 sats>92%  Pulm toilet; scheduled nebs  Monitor CT output, continue to suction  Repeat CXR in am       Renal:  Data/Assessment: Reviewed    Diagnosis Acute kidney injury - multifactorial: septic shock 2/2 MRSA TV endocarditis, IVDU  Hypervolemia and anasarca   Course critical   Plan Lasix 200 and Diuril 500 given overnight   Place new trialysis line   Nephrology to decide about HD today   Last HD (8/31)   Repeat BMP this afternoon   Replacing electrolytes per order  Strict I&O  Renally adjust medications   Avoid nephrotoxic agents   Repeat BMP in am       Gastrointestinal:  Data/Assessment: Protonix for stress ulcer prophylaxis   Diet:  Taking PO     Bowel Regimen:  Senna  Miralax     Diagnosis Dysphagia  Enlarged liver  Distended gallbladder with mild elevation in Alk phos and  bilirubin  constipation   Course  Stable    Plan Increased bowel regimen; give MOM and suppository   Received reliastor 9/1  Advance diet as tolerated   Continue stress ulcer prophylaxis   Speech/bedside to evaluate swallow ; passed       Endocrine:  Data/Assessment Last three Fingerstick glucose: Reviewed   Last HbA1C: NA  Most recent Thyroid results: 2.3      Diagnosis Hyperglycemia    Course Stable   Plan Target Goal 140-180  SSI Protocol PRN       Hematologic:  Data/Assessment:  Reviewed    Diagnosis Anemia of chronic disease and Thrombocytopenia   Course Stable   Plan SCDs (Sequential Compression Device)  For DVT Prophylaxis  Continue heparin gtt for TVR   Start coumadin tonight 2.522m(1st dose)   Continue to closely monitor cbc/coags   Monitor CT output      Infectious Diseases:  Data/Assessment Tmax last 24 hrs: Reviewed  Diagnosis MRSA TV Endocarditis  MRSA PNA    Course Critical   Plan Obtain repeat BC today (9/2)   ID asked to reevaluate patient today   Plan for IV Vanc per protocol x 6 weeks from negative BC  BAL 8/30 + MRSA   [facility] 8/31 = + MRSA   [facility] 9/1 = + GPC     Antibiotic therapy  Agent:  Vancomycin  Indication: MRSA      Will continue to monitor for additional s/s of infection         Family Communication and Disposition:  Data/Assessment Patient updated with plan of care     Dipso: Transfer to SDU     Flonnie Hailstone PA-C 02/16/2021   Late entry   Critical Care Staff  I personally saw and examined the patient on 02/16/21   Discussed treatment plan with midlevel.    I agree with the plan of care as documented.   Plan developed together  Electronically Signed by:    Hilliard Clark, MD 02/18/2021, 07:22

## 2021-02-17 ENCOUNTER — Inpatient Hospital Stay (HOSPITAL_COMMUNITY): Payer: Medicaid Other

## 2021-02-17 DIAGNOSIS — J9811 Atelectasis: Secondary | ICD-10-CM

## 2021-02-17 DIAGNOSIS — J90 Pleural effusion, not elsewhere classified: Secondary | ICD-10-CM

## 2021-02-17 DIAGNOSIS — J181 Lobar pneumonia, unspecified organism: Secondary | ICD-10-CM

## 2021-02-17 DIAGNOSIS — Z978 Presence of other specified devices: Secondary | ICD-10-CM

## 2021-02-17 DIAGNOSIS — Z952 Presence of prosthetic heart valve: Secondary | ICD-10-CM

## 2021-02-17 LAB — BASIC METABOLIC PANEL
ANION GAP: 11 mmol/L (ref 4–13)
ANION GAP: 11 mmol/L (ref 4–13)
BUN/CREA RATIO: 11 (ref 6–22)
BUN/CREA RATIO: 13 (ref 6–22)
BUN: 39 mg/dL — ABNORMAL HIGH (ref 8–25)
BUN: 46 mg/dL — ABNORMAL HIGH (ref 8–25)
CALCIUM: 7.3 mg/dL — ABNORMAL LOW (ref 8.5–10.0)
CALCIUM: 7.1 mg/dL — ABNORMAL LOW (ref 8.5–10.0)
CHLORIDE: 94 mmol/L — ABNORMAL LOW (ref 96–111)
CHLORIDE: 96 mmol/L (ref 96–111)
CO2 TOTAL: 19 mmol/L — ABNORMAL LOW (ref 22–30)
CO2 TOTAL: 18 mmol/L — ABNORMAL LOW (ref 22–30)
CREATININE: 3.63 mg/dL — ABNORMAL HIGH (ref 0.60–1.05)
CREATININE: 3.67 mg/dL — ABNORMAL HIGH (ref 0.60–1.05)
ESTIMATED GFR: 16 mL/min/BSA — ABNORMAL LOW (ref >=60)
ESTIMATED GFR: 16 mL/min/BSA — ABNORMAL LOW (ref >=60)
GLUCOSE: 98 mg/dL (ref 65–125)
GLUCOSE: 93 mg/dL (ref 65–125)
POTASSIUM: 4.5 mmol/L (ref 3.5–5.1)
POTASSIUM: 4.3 mmol/L (ref 3.5–5.1)
SODIUM: 124 mmol/L — ABNORMAL LOW (ref 136–145)
SODIUM: 125 mmol/L — ABNORMAL LOW (ref 136–145)

## 2021-02-17 LAB — ADULT ROUTINE BLOOD CULTURE, SET OF 2 BOTTLES (BACTERIA AND YEAST)
BLOOD CULTURE, ROUTINE: ABNORMAL — CR
BLOOD CULTURE, ROUTINE: ABNORMAL — CR

## 2021-02-17 LAB — CBC WITH DIFF
HCT: 29.1 % — ABNORMAL LOW (ref 34.8–46.0)
HGB: 9.4 g/dL — ABNORMAL LOW (ref 11.5–16.0)
MCH: 26.7 pg (ref 26.0–32.0)
MCHC: 32.3 g/dL (ref 31.0–35.5)
MCV: 82.7 fL (ref 78.0–100.0)
MPV: 9.5 fL (ref 8.7–12.5)
PLATELETS: 331 10*3/uL (ref 150–400)
RBC: 3.52 10*6/uL — ABNORMAL LOW (ref 3.85–5.22)
RDW-CV: 19.9 % — ABNORMAL HIGH (ref 11.5–15.5)
WBC: 10 10*3/uL (ref 3.7–11.0)

## 2021-02-17 LAB — MANUAL DIFF AND MORPHOLOGY-SYSMEX
BASOPHIL #: <0.1 10*3/uL (ref <=0.20)
BASOPHIL %: 0 %
EOSINOPHIL #: 0.1 10*3/uL (ref <=0.50)
EOSINOPHIL %: 1 %
LYMPHOCYTE #: 0.8 10*3/uL — ABNORMAL LOW (ref 1.00–4.80)
LYMPHOCYTE %: 8 %
METAMYELOCYTE %: 1 %
MONOCYTE #: 0.7 10*3/uL (ref 0.20–1.10)
MONOCYTE %: 7 %
NEUTROPHIL #: 8.3 10*3/uL — ABNORMAL HIGH (ref 1.50–7.70)
NEUTROPHIL %: 81 %
NEUTROPHIL BANDS %: 2 %
RBC MORPHOLOGY: NORMAL

## 2021-02-17 LAB — MAGNESIUM
MAGNESIUM: 2.3 mg/dL (ref 1.8–2.6)
MAGNESIUM: 2.5 mg/dL (ref 1.8–2.6)

## 2021-02-17 LAB — PTT (PARTIAL THROMBOPLASTIN TIME)
APTT: 66.8 seconds — ABNORMAL HIGH (ref 24.2–37.5)
APTT: 49.7 seconds — ABNORMAL HIGH (ref 24.2–37.5)
APTT: 71 seconds — ABNORMAL HIGH (ref 24.2–37.5)
APTT: 93.8 seconds — ABNORMAL HIGH (ref 24.2–37.5)

## 2021-02-17 LAB — PHOSPHORUS
PHOSPHORUS: 6.1 mg/dL — ABNORMAL HIGH (ref 2.4–4.7)
PHOSPHORUS: 6.4 mg/dL — ABNORMAL HIGH (ref 2.4–4.7)

## 2021-02-17 LAB — VANCOMYCIN, RANDOM: VANCOMYCIN RANDOM: 15.7 ug/mL — ABNORMAL LOW (ref 30.0–40.0)

## 2021-02-17 LAB — PT/INR
INR: 1.3 — ABNORMAL HIGH (ref 0.80–1.20)
PROTHROMBIN TIME: 15 seconds — ABNORMAL HIGH (ref 9.1–13.9)

## 2021-02-17 LAB — CREATINE KINASE (CK), TOTAL, SERUM: CREATINE KINASE: 51 U/L (ref 25–190)

## 2021-02-17 MED ORDER — SODIUM CHLORIDE 0.9 % INTRAVENOUS SOLUTION
8.0000 mg/kg | INTRAVENOUS | Status: DC
Start: 2021-02-17 — End: 2021-02-20
  Administered 2021-02-17: 0 mg via INTRAVENOUS
  Administered 2021-02-17: 550 mg via INTRAVENOUS
  Administered 2021-02-19: 0 mg via INTRAVENOUS
  Administered 2021-02-19: 550 mg via INTRAVENOUS
  Filled 2021-02-17 (×3): qty 11

## 2021-02-17 MED ORDER — WARFARIN 2.5 MG TABLET
2.5000 mg | ORAL_TABLET | Freq: Once | ORAL | Status: AC
Start: 2021-02-17 — End: 2021-02-17
  Administered 2021-02-17 (×2): 2.5 mg via ORAL
  Filled 2021-02-17: qty 1

## 2021-02-17 MED ORDER — LIDOCAINE 3.6 %-MENTHOL 1.25 % TOPICAL PATCH
1.0000 | MEDICATED_PATCH | Freq: Every day | CUTANEOUS | Status: DC
Start: 2021-02-17 — End: 2021-03-02
  Administered 2021-02-17 – 2021-03-02 (×16): 1 via TRANSDERMAL
  Filled 2021-02-17 (×6): qty 1

## 2021-02-17 NOTE — Pharmacy (Signed)
[place] / Department of Pharmaceutical Services  Therapeutic Drug Monitoring: Vancomycin  02/17/2021      Patient name: [name] [name], [name] [name]  Date of Birth:  [birth year]    Actual Weight:  Weight: 65.7 kg (144 lb 13.5 oz) (02/17/21 0500)     BMI:  BMI (Calculated): 24.15 (02/17/21 0500)      Date RPh Current regimen (including mg/kg) Indication &  Organism AUC or trough based dosing Target Levels^ SCr (mg/dL) CrCl* (mL/min) Infectious Laboratory Markers (as applicable)   Measured level(s)   (mcg/mL) Calculated AUC (if AUC based monitoring) Plan & predicted AUC/trough if initial dosing (including when levels are due) Comments   8/22 [facility] intermittent dosing at outside hospital with dialysis endocarditis trough 13-17 6.21 12 WBC:11.3  Procal:  CRP: Vanc random 24.4  No Vanc at this time as the level is 24.4 - will proceed with intermittent dosing- dialysis plan unclear at this time - will clarify in the morning to determine further dosing/levels    8/23 TWS Intermittent Dosing As above As above 13-17 CVVHD CVVHD    - Start vancomycin 1000 mg IV q24h while patient is on CRRT  - If CRRT is stopped, discontinue scheduled dose and dose by levels    8/25 TWS Vancomycin 1000 mg IV q24h As above As above 13-17 CVVHD CVVHD  16h level = 19.4  - Continue current dosing  - Obtain trough on 8/26    8/26 [name]  Vanc 1000 mg (15 mg/kg AdjBW) q24h Endocarditis  Trough  13-17 CRRT CRRT WBC: 24.8  16.9 (drawn approp)  -  - Continue current regimen   - Recommend checking trough again in 2-3 days or sooner if clinically indicated (e.g. changing CRRT --> iHD)    8/27 [name] Vancomycin 1000 mg IV q24h As above Trough 20-25 iHD iHD    28.9 pre-iHD level  - Will give supplemental dose after iHD of vancomycin 500 mg IV x 1, random with am labs tomorrow given unclear iHD schedule.     8/28 [name] Intermittent  Vancomycin 500 mg IV x 1 after iHD given on 8/27 As above Trough 20-25 iHD iHD  29.0 pre-iHD  - Defer dosing today. Plan for iHD again  tomorrow (8/29). Consider giving 500 mg or 250 mg with next iHD session.  - Repeat pre iHD level 8/30    8/29 TWS Intermittent dosing As above Trough 20-25 pre-HD iHD iHD  23.2 pre-HD  - Give 500 mg x1 post-HD  - Follow plans for iHD and give 500 mg post-HD    8/31 TWS Intermittent dosing As above Trough 20-25 pre-HD iHD iHD  32.7 pre-HD  - Give 250 mg x1 post-HD  - Follow plans for iHD and give 250 mg post-HD    9/3 LCK Vanc DC'd                                                                                                                                                                       ^  Target levels depends on dosing and monitoring method, AUC vs. trough based. For AUC based dosing units are mg*h/L. For trough based dosing units are mcg/mL.     *Creatinine clearance is estimated by using the Cockcroft-Gault equation for adult patients and the Carol Ada for pediatric patients.    The decision to discontinue vancomycin therapy will be determined by the primary service.  Please contact the pharmacist with any questions regarding this patient's medication regimen.
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
## 2021-02-17 NOTE — Ancillary Notes (Signed)
Visited patient for ambulation, patient refused stating she recently got into her chair and wished to rest.    Patient reminded that they may contact nursing/[facility] staff to ambulate.    Will follow-up with patient for further cardiac rehab services.    Patient was in chair with call bell within reach.      [place], ES  Cardiac Rehabilitation  [phone]

## 2021-02-17 NOTE — Consults (Signed)
INFECTIOUS DISEASE FOLLOW UP CONSULTATION    Patient Name: [name] [name] Number: [id number]  Date of Service: 02/17/2021  Date of Birth: 1986-01-18    Hospital Day:  LOS: 12 days     Reason for Consultation: MRSA native tricuspid valve endocarditis     Subjective: No overnight events noted. The patient feels like her rash is getting worse. She notes that it is painful and pruritic. Dermatology saw her and recommended triamcinolone cream but the patient does not feel it is helping. She is afebrile. She denies any other current complaints.      Objective:  Physical Exam:  Current Vitals: BP 114/80   Pulse 100   Temp 37.3 C (99.2 F)   Resp 19   Ht 1.651 m (_0 )   Wt 65.7 kg (144 lb 13.5 oz)   SpO2 (!) 51%   BMI 24.10 kg/m       Vitals in last 24 hours: Temp  Avg: 37.1 C (98.8 F)  Min: 36.8 C (98.2 F)  Max: 37.7 C (99.9 F)  MAP (Non-Invasive)  Avg: 94.5 mmHG  Min: 78 mmHG  Max: 112 mmHG  Pulse  Avg: 96.7  Min: 83  Max: 105  Resp  Avg: 25.9  Min: 16  Max: 41  SpO2  Avg: 91.5 %  Min: 51 %  Max: 100 %     General: No acute distress.  Chronically ill- appearing.  Eyes: Pupils equal round and reactive bilaterally.  No conjunctival injection or scleral icterus.  ENT: Face symmetrical.  Mucous membranes pink and moist.  No oral candidiasis or mucosal ulcers. Edentulous.   Neck: Supple and symmetrical.  Lungs: Clear to auscultation bilaterally.  No wheezes, rhonchi, or crackles.  Unlabored respirations.  Cardiovascular: Regular rate and rhythm.  Sternal wound site clean, no cellulitis noted.   Abdomen: Normoactive bowel sounds.  Nondistended, soft, and nontender to palpation.    Extremities: No joint erythema or swelling. 1+ edema.  Skin: Warm and dry. Extensive purpuric rash to bilateral lower and upper extremities, lower > upper  Neurologic: Alert and oriented X 3.   Moves all extremities without focal deficit.  Psychiatric: Appropriate affect and behavior.  Intact memory.  Fluent speech.     Current  Antimicrobials:  Antibiotics (From admission, onward)      Start     Stop Route Frequency    02/10/21 1900  vancomycin (VANCOCIN) 500 mg in NS 100 mL IVPB         08/27 2353 IV ONCE    02/10/21 1016  Vancomycin IV Intermittent Dosing         -- N/A DAILY PRN    02/06/21 2000  vancomycin (VANCOCIN) 1 g in D5W 200 mL premix IVPB  Status:  Discontinued         08/27 1016 IV EVERY 24 HOURS    02/05/21 0226  Vancomycin IV Intermittent Dosing  Status:  Discontinued         08/23 1608 N/A DAILY PRN             Lines:  Patient Lines/Drains/Airways Status       Active Line / Dialysis Catheter / Dialysis Graft / Drain / Airway / Wound       Name Placement date Placement time Site Days    Peripheral IV Ultrasound guided;Extended dwell catheter Right Cephalic  (lateral side of arm);Forearm 02/12/21  0201  -- 5    Peripheral IV Ultrasound guided;Extended dwell catheter Anterior;Proximal;Right;Upper Arm  02/12/21  0204  -- 5    Midline Double Lumen Lumen 1 Purple Lumen 2 Red Left;Brachial Vein NOT A CENTRAL LINE 02/15/21  1700  4 FR  1    Wound (Non-Surgical) Anterior;Right Elbow 02/05/21  0045  -- 12    Wound (Non-Surgical) Anterior;Right Finger 1;2;3;4;5 02/10/21  2000  -- 6    Wound (Non-Surgical) Anterior;Left Finger 1;2;3;4;5 02/10/21  2000  -- 6    Wound (Non-Surgical) Buttock 02/11/21  2000  -- 5    Surgical Incision Inner Mouth 02/09/21  --  -- 8    Surgical Incision Mediastinal Chest 02/13/21  --  -- 4    Surgical Incision Right Groin 02/13/21  --  -- 4                     Laboratory Studies:  CBC Differential   Recent Labs     02/16/21  0327 02/16/21  1248 02/17/21  0319   WBC 10.4 11.1* 10.0   HGB 8.9* 9.2* 9.4*   HCT 27.7* 28.7* 29.1*   PLTCNT 266 294 331   BANDS  --   --  2    Recent Labs     02/16/21  0327 02/17/21  0319   PMNS 77 81   LYMPHOCYTES 7 8   MONOCYTES 11 7   EOSINOPHIL 2 1   BASOPHILS 1  0.10 0  <0.10   PMNABS 8.01* 8.30*   MONOSABS 1.14* 0.70   EOSABS 0.21 0.10      BMP LFTs   Recent Labs      02/17/21  0319   SODIUM 124*   POTASSIUM 4.5   CHLORIDE 94*   CO2 19*   BUN 39*   CREATININE 3.63*   GLUCOSENF 98   ANIONGAP 11   BUNCRRATIO 11   GFR 16*   CALCIUM 7.3*   MAGNESIUM 2.3   PHOSPHORUS 6.1*    No results found for this encounter   CoAgs Blood Gas:   Recent Labs     02/17/21  0319   PROTHROMTME 15.0*   INR 1.30*   APTT 66.8*    No results found for this encounter    Cardiac Markers Lipid Panel   No results for input(s): TROPONINI, CKMB, MBINDEX, BNP in the last 72 hours. No results found for this encounter   Urine Analysis Other Labs   No results found for this encounter No results found for this encounter    Invalid input(s): PRL     Microbiology:  Hospital Encounter on 02/05/21 (from the past 96 hour(s))   BRONCHOALVEOLAR LAVAGE (BAL) QUANTITATIVE CULTURE/GRAM STAIN    Collection Time: 02/13/21  3:37 PM    Specimen: Bronchoalveolar Lavage; Other   Culture Result Status    BRONCHOALVEOLAR LAVAGE (BAL) QUANTITATIVE CULTURE (A) Final     >100000 Methicillin Resistant Staphylococcus aureus    GRAM STAIN 3+ Several PMNs Final    GRAM STAIN 2+ Few Gram Positive Cocci/Clusters Final       Susceptibility    Methicillin Resistant Staphylococcus aureus - MIC SUSCEPTIBILITY     Oxacillin >=4 Resistant mcg/mL     Gentamicin* <=0.5 Sensitive mcg/mL      * The role of gentamicin for the treatment of MSSA and MRSA infections is in combination with a cell-wall acting agent in the setting of prosthetic valve endocarditis.     Erythromycin >=8 Resistant mcg/mL     Clindamycin 0.25 Sensitive mcg/mL     Tetracycline <=  1 Sensitive mcg/mL     Doxycycline <=0.5 Sensitive mcg/mL     Linezolid 2 Sensitive mcg/mL     Vancomycin 1 Sensitive mcg/mL     Tigecycline <=0.12 Sensitive mcg/mL     Rifampin* <=0.5 Sensitive mcg/mL      * Rifampin should not be used alone for the treatment of Staphylococcus due to the quick emergence of resistance. For use in combination with other active agents for prosthetic material biofilm  penetration.      Trimethoprim/Sulfamethoxazole <=10 Sensitive mcg/mL   ADULT ROUTINE BLOOD CULTURE, SET OF 2 ADULT BOTTLES (BACTERIA AND YEAST)    Collection Time: 02/14/21  5:14 AM    Specimen: Blood   Culture Result Status    BLOOD CULTURE, ROUTINE Abnormal Stain (AA) Final    BLOOD CULTURE, ROUTINE Methicillin Resistant Staphylococcus aureus (A) Final    GRAM STAIN Gram Positive Cocci/Clusters (A) Final    Narrative    For susceptibility, see previous report.     ADULT ROUTINE BLOOD CULTURE, SET OF 2 ADULT BOTTLES (BACTERIA AND YEAST)    Collection Time: 02/14/21  5:14 AM    Specimen: Blood   Culture Result Status    BLOOD CULTURE, ROUTINE Abnormal Stain (AA) Final    BLOOD CULTURE, ROUTINE Methicillin Resistant Staphylococcus aureus (A) Final    GRAM STAIN Gram Positive Cocci/Clusters (A) Final    GRAM STAIN Gram Positive Cocci/Clusters (A) Final    Narrative    For susceptibility, see previous report.     ADULT ROUTINE BLOOD CULTURE, SET OF 2 ADULT BOTTLES (BACTERIA AND YEAST)    Collection Time: 02/15/21  8:06 AM    Specimen: Blood   Culture Result Status    BLOOD CULTURE, ROUTINE Abnormal Stain (AA) Preliminary    GRAM STAIN Gram Positive Cocci/Clusters (A) Preliminary    GRAM STAIN Gram Positive Cocci/Clusters (A) Preliminary   ADULT ROUTINE BLOOD CULTURE, SET OF 2 ADULT BOTTLES (BACTERIA AND YEAST)    Collection Time: 02/15/21  8:06 AM    Specimen: Blood   Culture Result Status    BLOOD CULTURE, ROUTINE Abnormal Stain (AA) Preliminary    BLOOD CULTURE, ROUTINE Methicillin Resistant Staphylococcus aureus (A) Preliminary    GRAM STAIN Gram Positive Cocci/Clusters (A) Preliminary    Narrative    For susceptibility, see previous report.         No results found for any visits on 02/05/21 (from the past 24 hour(s)).    Imaging Studies:  Results for orders placed or performed during the hospital encounter of 02/05/21 (from the past 72 hour(s))   XR AP MOBILE CHEST     Status: None    Narrative    [place]  Female, 35 years old.    XR AP MOBILE CHEST performed on 02/15/2021 5:10 AM.    REASON FOR EXAM:  Status-Post Cardiothoracic Surgery - evaluation for effusion    TECHNIQUE: 1 view/1 image(s) submitted for interpretation.    FINDINGS: Compared to 02/14/2021. There is a shallow inspiration. The heart is stable in size and there are postoperative changes identified. Patchy bilateral nodules and cavities as well as airspace opacities are noted. There is no visible pneumothorax. Chest tubes remain in place. Right sheath has been removed.        Impression    Stable lung aeration.     XR ABD SUPINE     Status: None    Narrative    Ashleigh Broski  Female, 35 years old.  XR ABD SUPINE performed on 02/15/2021 3:00 PM.    REASON FOR EXAM:  nausea    TECHNIQUE: 1 views/2 images submitted for interpretation.    COMPARISON:  Abdominal radiographs, CT chest abdomen pelvis 02/05/2021    FINDINGS:  There is moderate gaseous distention of multiple loops of small bowel throughout the central abdomen. No significant stool burden is appreciated. Median sternotomy wires are evident, with questioned small amount of pneumomediastinum. This is in keeping with patient's recent surgical procedure. Interval removal of multiple chest tubes. No acute bony abnormality is identified.      Impression    Moderate gaseous distention multiple loops of small bowel in the central abdomen. Bowel gas pattern consistent with ileus. Close attention to follow-up is recommended.   XR AP MOBILE CHEST     Status: None    Narrative    Nada Moye  Female, 35 years old.    XR AP MOBILE CHEST performed on 02/16/2021 5:40 AM.    REASON FOR EXAM:  s/p TVR    TECHNIQUE: 1 views/1 images submitted for interpretation.    COMPARISON:  02/15/2021.    FINDINGS: The mediastinal pleural drains have been removed. Stable median sternotomy wires. Stable cardiac silhouette. Stable bilateral lower lung opacities. No definite pneumothorax.      Impression    Stable bilateral  lower lung opacities.   XR ABD SUPINE     Status: None    Narrative    Ashly Capri  Female, 35 years old.    XR ABD SUPINE performed on 02/16/2021 2:00 PM.    REASON FOR EXAM:  abdominal ileus    TECHNIQUE: 2 views/2 images submitted for interpretation.    FINDINGS:  Air is present in mildly distended large and small bowel. No significant fecal retention is noted. Bowel gas is present at the level of the rectum.      Impression    Persistent mild ileus type bowel gas pattern, stable from the prior study.   XR AP MOBILE CHEST     Status: None    Narrative    XR AP MOBILE CHEST performed on 02/17/2021 5:53 AM    INDICATION: 35 years old Female  s/p TVR    TECHNIQUE: 1 view(s) of the chest; 1 image(s)    COMPARISON: Chest radiograph 02/16/2021.    FINDINGS: The heart size is normal. Bilateral pleural effusions with   associated atelectasis/consolidation, right greater than left are   unchanged. Left vascular access catheter terminates over the upper arm.        Impression    Unchanged bilateral pleural effusions with associated   atelectasis/consolidation.         Results for orders placed or performed during the hospital encounter of 02/05/21 (from the past 24 hour(s))   XR ABD SUPINE     Status: None    Narrative    [place]  Female, 35 years old.    XR ABD SUPINE performed on 02/16/2021 2:00 PM.    REASON FOR EXAM:  abdominal ileus    TECHNIQUE: 2 views/2 images submitted for interpretation.    FINDINGS:  Air is present in mildly distended large and small bowel. No significant fecal retention is noted. Bowel gas is present at the level of the rectum.      Impression    Persistent mild ileus type bowel gas pattern, stable from the prior study.   XR AP MOBILE CHEST     Status: None    Narrative  XR AP MOBILE CHEST performed on 02/17/2021 5:53 AM    INDICATION: 35 years old Female  s/p TVR    TECHNIQUE: 1 view(s) of the chest; 1 image(s)    COMPARISON: Chest radiograph 02/16/2021.    FINDINGS: The heart size is normal.  Bilateral pleural effusions with   associated atelectasis/consolidation, right greater than left are   unchanged. Left vascular access catheter terminates over the upper arm.        Impression    Unchanged bilateral pleural effusions with associated   atelectasis/consolidation.         Assessment:    This is a 35 y.o. female with a history of tobacco use, cleared hepatitis-C infection, nephrolithiasis and active injection drug use who was transferred to [facility] from [facility] on February 05, 2021 with MRSA native tricuspid valve endocarditis.  She presented to [facility] on January 31, 2021 with altered mental status, and was found to be in renal failure.  She was intubated and started on hemodialysis.  Blood and sputum cultures that grew MRSA.  On transfer to [place], a transthoracic echocardiogram confirmed a tricuspid valve vegetation.  CT of the chest shows bilateral airspace opacities some of which are cavitary likely representing septic emboli.  Blood cultures have remained persistently positive for MRSA. MRIs of the spine without contrast showed no evidence of discitis or epidural abscess. 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 OR cultures positive for MRSA as well. Repeated blood cultures pending from 8/31 and  02/15/21 remain positive. She was requiring intermittent HD, last dialysis was 02/14/2021 and HD catheter was removed on 02/14/2021. The patient also developed a purpuric rash on her extremities for which Dermatology was consulted and a biopsy was done and is pending. 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 We are concerned that vancomycin may be causing the rash and therefore would recommend to switch therapy.      Recommendations:  -discontinue vancomycin   -start daptomycin 76m/kg q48h  -continue rifampin 300 mg BID   -repeat blood cultures every 48 hours until negative  -patient will transition to MH-7 once she is stable, will consider CoPAT  therapy after 2-4 weeks of IV antibiotics      Please continue to follow CBC with differential, BUN, creatinine, hepatic function panel, and CRP while the patient remains on antimicrobial therapy.    Thank you for this consult. Please call or page the Infectious Diseases Service with any questions regarding this patient.    JGeorgian Co DO PGY-4  Infectious Diseases      I saw and examined the patient.  I reviewed the fellow's note.  I agree with the findings and plan of care as documented in the note.  Any exceptions/additions are edited/noted.  Given potential for rash from her IV Vanc will switch to IV Daptomycin.  Started PO Rifampicin yesterday.    Traeger Sultana R. SGloriajean DellMD  Pager [phone] 02/17/2021

## 2021-02-17 NOTE — Progress Notes (Signed)
[facility]  HVI Critical Care Consult/Progress Note     [name] [name], [name] [name], 35 y.o. female  Date of Birth: 1986/02/17  Medical Record Number:  [id number]   Inpatient Admission Date: 02/05/2021   Hospital Day:  LOS: 0 days      Chief Complaint: Altered Mental Status   History of Present Illness: Per previous providers: "Pt is a 35 yo female with a significant PMH for Smoking and IVDU presented to [name] with AMS on 01/31/21. She was found to have a drug overdose, aspiration PNA requiring intubation, MRSA bacteremia, large TV endocarditis with moderate TR and elevated systemic pulmonary pressures (49mhg), AKI requiring HD, anemia requiring PRBC and thrombocytopenia. Urine drug screen + for heroin and methamphetamines.     [facility]."   POD #/ Procedure: 8/22: [facility]  8/30: On pump beating heart TVR (Epic 33)  PFO colsure   Hospital Course/Major events:  8/22: See above.   -Prop for sedation. Agitated; MAE; does not follow. Repeat CT Head/CAP. Hemodynamics stable. Intubated. Repeated blood cultures. Continue Vanc.   8/23: had pan CT 8/22, still not awakening/folowing commands on precedex  But agitated. MRI brain ordered, started on CRRT  8/24: remains on CRRT, MRI Brain no abnormality, following commands on dexmedetomidine, quetiapine, prn dilaudid  8/25 brocnh today for increased ETT secretions,plan for OR early next week, OMFS following for dental root extriction.  Pt self-extubated this afternoon.  Doing well on NC.  8/26: Intermittent delirium. Weak. Continue Seroquel.  Hemodynamics stable. NC. Aggressive pulm toilet. Continue nebs. Speech to see. Dental procedure today. Continues + BC. ID following; MRI CTLS ordered. Continue antibiotics. Surgical plan TBA.   8/27:  MRI completed today. Intermittent dex. Surgical plan TBA. HD today per nephology. Bedside speech and swallow. Blood  repeat Cx tomorrow.   8/28 repeat TTE today for OR later this week, HD holiday will give 200 mg Lasix for diuresis (followed by diuril 5045mif no response w/lasix) and D/C [place] for line holiday  8/29: OR tomorrow. Agitation. Seroquel. Hemodynamics stable. HD today prior to surgery. New line placed 2/2 line holiday. 2 u PRBC pre op optimization.   8/30: OR for TVR, PFO closure, out of OR intubated, levophed infusing, normal EF  8/31: OBT, deline, continue CT, HD today, repeat BC  9/1: hypertensive; started lopressor 6.2575mID, repeat BC, lasix 80 IV BID started, holding HD, repeat BC   9/2: Decrease Seroquel, increase BB, d/c pacer wires and groin bumper, lasix 200, Diuril 500, place trialysis line, give 1st dose coumadin tonight, repeat BC [facility]9/3: switched to daptomycin from vancomycin due to rash.    Past Medical History: No past medical history on file.    Past Surgical History: No past surgical history on file.   Social History:  Social History            Socioeconomic History   . Marital status: Divorced       Spouse name: Not on file   . Number of children: Not on file   . Years of education: Not on file   . Highest education level: Not on file   Occupational History   . Not on file   Tobacco Use   . Smoking status: Not on file   . Smokeless tobacco: Not on file   Substance and Sexual Activity   . Alcohol use: Not on file   .  Drug use: Not on file   . Sexual activity: Not on file   Other Topics Concern   . Not on file   Social History Narrative   . Not on file      Social Determinants of Health      Financial Resource Strain: Not on file   Food Insecurity: Not on file   Transportation Needs: Not on file   Physical Activity: Not on file   Stress: Not on file   Intimate Partner Violence: Not on file   Housing Stability: Not on file       Family History: Family Medical History:       None                Allergies: No Known Allergies   ROS: Continue to have improved pain and shortness of breath   Minimal urge to  urinate   + abdominal bloating declines any abdominal pain, nausea, or vomiting       Problem List:       Patient Active Problem List   Diagnosis   . Endocarditis         Medications: Reviewed      Last VS:    .  Filed Vitals:    02/17/21 1100 02/17/21 1115 02/17/21 1130 02/17/21 1145   BP: 113/64      Pulse: 87 87 90 89   Resp: (!) 30  (!) 30 (!) 24   Temp:       SpO2: 96% 95% 97% 93%        Physical Exam  Vitals reviewed.   Constitutional:       Appearance: She is ill-appearing.      Comments: Critically ill appearing female, no acute distress, becomes restless at times   HENT:      Head: Normocephalic.      Mouth/Throat:      Mouth: Mucous membranes are moist.   Eyes:      Extraocular Movements: Extraocular movements intact.      Conjunctiva/sclera: Conjunctivae normal.      Pupils: Pupils are equal, round, and reactive to light.   Cardiovascular:      Rate and Rhythm: Regular rhythm.      Pulses: Normal pulses.   Pulmonary:      Comments: Diminished breath sounds throughout, shallow inspiratory effort    Abdominal:      General: Abdomen is flat. There is no distension.      Palpations: Abdomen is soft. There is no mass.      Hernia: No hernia is present.      Comments: Hypoactive bowel sounds   Musculoskeletal:         General: Swelling present. Normal range of motion.      Cervical back: No rigidity.      Right lower leg: Edema present.      Left lower leg: Edema present.   Skin:     General: Skin is warm and dry.   Neurological:      General: No focal deficit present.      Mental Status: She is alert and oriented to person, place, and time.   Psychiatric:      Comments: Anxious              _0 I have reviewed the patient's vitals signs, the nursing notes, the physician's notes and other progress notes      _1 I have reviewed the laboratory and imaging data     _2 I  have discussed this case with CCM team, SCT         Systems Based Assessment and Plan:     Neurologic:  Data/Assessment Reviewed    Diagnosis  Altered mental status 2/2 encephalopathy   Overdose 2/2 IVDU  IVD withdrawal  Back pain    Course:  improving    Plan Continue oral oxy   Continue Seroquel qHS  Pain/Psych consult    MRI Brain 8/23 negative  MRI of the cervical, thoracic, and lumbosacral spine completed to r/o abscess; negative   Day/night policy, reorientation as needed  PT/OT/Cardiac rehab   Serial neuro checks        Cardiovascular:  Data/Assessment Most Recent Hemodynamics: Reviewed   Cardiovascular support: N/A       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; s/p PFO closure  Moderate TR  Mild Pulmonary HTN  Hypertension    Course Improving    Plan Continue lopressor 12.62m BID; titrate as tolerated   Coumadin 2.568mtonight   Continue heparin gtt per SCT   Continue ASA       Respiratory:  Data/Assessment: CXR: Reviewed   Most recent VBG: reviewed  Vent Settings: NA      Diagnosis Acute respiratory failure 2/2 Aspiration PNA and altered mental status  Septic pulmonary emboli  Smoker  MRSA PNA   Mechanical ventilation post op      Course stable   Plan F/U BAL from 8/30 + MRSA   FIO2 wean as tolerated for O2 sats>92%  Pulm toilet; scheduled nebs  Monitor CT output, continue to suction  Repeat CXR in am       Renal:  Data/Assessment: Reviewed    Diagnosis Acute kidney injury - multifactorial: septic shock 2/2 MRSA TV endocarditis, IVDU  Hypervolemia and anasarca   Course As expected   Plan Lasix 200 and Diuril 500 given overnight   Pending nephro to determine need for Dialysis   Last HD (8/31)   Repeat BMP this afternoon   Replacing electrolytes per order  Strict I&O  Renally adjust medications   Avoid nephrotoxic agents   Repeat BMP in am       Gastrointestinal:  Data/Assessment: Protonix for stress ulcer prophylaxis   Diet:  Taking PO     Bowel Regimen:  Senna  Miralax     Diagnosis Dysphagia  Enlarged liver  Distended gallbladder with mild elevation in Alk phos and bilirubin  constipation   Course  Stable    Plan Increased bowel  regimen; give MOM and suppository   Received reliastor 9/1  Advance diet as tolerated   Continue stress ulcer prophylaxis   Speech/bedside to evaluate swallow ; passed       Endocrine:  Data/Assessment Last three Fingerstick glucose: Reviewed   Last HbA1C: NA  Most recent Thyroid results: 2.3      Diagnosis Hyperglycemia    Course Stable   Plan Target Goal 140-180  SSI Protocol PRN       Hematologic:  Data/Assessment:  Reviewed    Diagnosis Anemia of chronic disease and Thrombocytopenia   Course Stable   Plan SCDs (Sequential Compression Device)  For DVT Prophylaxis  Continue heparin gtt for TVR   coumadin tonight 2.7m20m Continue to closely monitor cbc/coags   Monitor CT output      Infectious Diseases:  Data/Assessment Tmax last 24 hrs: Reviewed        Diagnosis MRSA TV Endocarditis  MRSA PNA    Course guarded  Plan Obtain repeat BC today (9/2)   ID asked to reevaluate patient today   Plan for IV Vanc per protocol x 6 weeks from negative BC  Due to rash will transition to daptomycin 54m/kg q48h   [facility] 8[facility]30 + MRSA   [facility]   [facility]       Antibiotic therapy  Agent:  Vancomycin  Indication: MRSA      Will continue to monitor for additional s/s of infection         Family Communication and Disposition:  Data/Assessment Patient updated with plan of care     Dipso: Transfer to SDU     EDerenda Mis MD PGY VII  Fellow, Department of Pulmonary, Critical Care, and Sleep Medicine  [facility]   Patient was seen examined and discussed with the cardiovascular ICU team.  Agree with examination the note in the plan as outlined.  Patient is a 35year old female the history of IV drug abuse who is status post trach cuspid valve replacement for endocarditis.  She has been critically ill and has had a long hospital course.  With that said she is been making improvements is ready for step-down.  On examination today he does appear chronically ill she is neurologically intact she follows commands  pupils are reactive, her incision is clean heart is regular she does have a murmur, breath sounds are clear, abdomen is soft positive bowel sounds no palpable masses, no significant edema.  Impression:  History of IV drug abuse, tricuspid valve endocarditis, status post trust tricuspid valve replacement, acute kidney injury, multifactorial anemia, hyponatremia.  Plan:  Continue current antibiotics, as needed pain medication, no acute indication for dialysis now for nephrology is following for dialysis needs, physical therapy occupational therapy, subcutaneous heparin for DVT prophylaxis, follow routine labs, transfer the patient to floor when a bed is available.  This is an E and M visit.

## 2021-02-18 ENCOUNTER — Inpatient Hospital Stay (HOSPITAL_COMMUNITY): Payer: Medicaid Other

## 2021-02-18 DIAGNOSIS — J90 Pleural effusion, not elsewhere classified: Secondary | ICD-10-CM

## 2021-02-18 DIAGNOSIS — I2699 Other pulmonary embolism without acute cor pulmonale: Secondary | ICD-10-CM

## 2021-02-18 DIAGNOSIS — R918 Other nonspecific abnormal finding of lung field: Secondary | ICD-10-CM

## 2021-02-18 LAB — CBC WITH DIFF
HCT: 27.1 % — ABNORMAL LOW (ref 34.8–46.0)
HGB: 8.9 g/dL — ABNORMAL LOW (ref 11.5–16.0)
MCH: 26.6 pg (ref 26.0–32.0)
MCHC: 32.8 g/dL (ref 31.0–35.5)
MCV: 81.1 fL (ref 78.0–100.0)
MPV: 8.7 fL (ref 8.7–12.5)
PLATELETS: 348 10*3/uL (ref 150–400)
RBC: 3.34 10*6/uL — ABNORMAL LOW (ref 3.85–5.22)
RDW-CV: 19.8 % — ABNORMAL HIGH (ref 11.5–15.5)
WBC: 10.7 10*3/uL (ref 3.7–11.0)

## 2021-02-18 LAB — PHOSPHORUS: PHOSPHORUS: 6.9 mg/dL — ABNORMAL HIGH (ref 2.4–4.7)

## 2021-02-18 LAB — BASIC METABOLIC PANEL
ANION GAP: 9 mmol/L (ref 4–13)
BUN/CREA RATIO: 13 (ref 6–22)
BUN: 47 mg/dL — ABNORMAL HIGH (ref 8–25)
CALCIUM: 7.5 mg/dL — ABNORMAL LOW (ref 8.5–10.0)
CHLORIDE: 95 mmol/L — ABNORMAL LOW (ref 96–111)
CO2 TOTAL: 20 mmol/L — ABNORMAL LOW (ref 22–30)
CREATININE: 3.61 mg/dL — ABNORMAL HIGH (ref 0.60–1.05)
ESTIMATED GFR: 16 mL/min/BSA — ABNORMAL LOW (ref >=60)
GLUCOSE: 94 mg/dL (ref 65–125)
POTASSIUM: 4.1 mmol/L (ref 3.5–5.1)
SODIUM: 124 mmol/L — ABNORMAL LOW (ref 136–145)

## 2021-02-18 LAB — MANUAL DIFF AND MORPHOLOGY-SYSMEX
BASOPHIL #: 0.11 10*3/uL (ref <=0.20)
BASOPHIL %: 1 %
EOSINOPHIL #: 0.11 10*3/uL (ref <=0.50)
EOSINOPHIL %: 1 %
LYMPHOCYTE #: 1.18 10*3/uL (ref 1.00–4.80)
LYMPHOCYTE %: 11 %
METAMYELOCYTE %: 1 %
MONOCYTE #: 0.75 10*3/uL (ref 0.20–1.10)
MONOCYTE %: 7 %
NEUTROPHIL #: 8.45 10*3/uL — ABNORMAL HIGH (ref 1.50–7.70)
NEUTROPHIL %: 79 %

## 2021-02-18 LAB — PT/INR
INR: 2.02 — ABNORMAL HIGH (ref 0.80–1.20)
INR: 2.59 — ABNORMAL HIGH (ref 0.80–1.20)
PROTHROMBIN TIME: 23.6 seconds — ABNORMAL HIGH (ref 9.1–13.9)
PROTHROMBIN TIME: 30.4 seconds — ABNORMAL HIGH (ref 9.1–13.9)

## 2021-02-18 LAB — ANAEROBIC CULTURE

## 2021-02-18 LAB — MAGNESIUM: MAGNESIUM: 2.5 mg/dL (ref 1.8–2.6)

## 2021-02-18 MED ORDER — WARFARIN 1 MG TABLET
1.0000 mg | ORAL_TABLET | Freq: Once | ORAL | Status: AC
Start: 2021-02-18 — End: 2021-02-18
  Administered 2021-02-18 (×2): 1 mg via ORAL
  Filled 2021-02-18: qty 1

## 2021-02-18 NOTE — Care Plan (Addendum)
Care plan reviewed with patient. Plan for possible transfer to stepdown 9/5. Pain controlled with PRN PO pain medications. Patient tolerating mechanical soft renal diet without complications. Patient ambulating x SBA. Fall precautions maintained w/ bed/chair alarm in place/armed. Sternal precautions in place with patient education for splinting cough and proper body mechanics related to precautions. Cardiac precautions in place with progressive ambulation as tolerated and education regarding post interventions and new medications reviewed with patient. Discharge planning ongoing.     Interventions: Ambulated x 3-4, pain control    Plan of Care: Progress to stepdown depending on kidney function and transfer to infusion service when appropriate for ABX therapy.    DME(s): None    Family / Patient Concerns: Keep informed      [name] [name] Highest Level of Mobility Goal      Date: 02/18/21     JH-HLM Goal:      Exercise Level: 8- Walked 250 feet or more    Goal Outcome: Goal Achieved           Imaging, Kardex, and Labs Reviewed:  Results for orders placed or performed during the hospital encounter of 02/05/21 (from the past 24 hour(s))   PTT (PARTIAL THROMBOPLASTIN TIME)   Result Value Ref Range    APTT 71.0 (H) 24.2 - 37.5 seconds    Narrative    Therapeutic range for unfractionated heparin is 60-100 seconds.   BASIC METABOLIC PANEL   Result Value Ref Range    SODIUM 125 (L) 136 - 145 mmol/L    POTASSIUM 4.3 3.5 - 5.1 mmol/L    CHLORIDE 96 96 - 111 mmol/L    CO2 TOTAL 18 (L) 22 - 30 mmol/L    ANION GAP 11 4 - 13 mmol/L    CALCIUM 7.1 (L) 8.5 - 10.0 mg/dL    GLUCOSE 93 65 - 125 mg/dL    BUN 46 (H) 8 - 25 mg/dL    CREATININE 3.67 (H) 0.60 - 1.05 mg/dL    BUN/CREA RATIO 13 6 - 22    ESTIMATED GFR 16 (L) >=60 mL/min/BSA   PHOSPHORUS   Result Value Ref Range    PHOSPHORUS 6.4 (H) 2.4 - 4.7 mg/dL   MAGNESIUM   Result Value Ref Range    MAGNESIUM 2.5 1.8 - 2.6 mg/dL   PTT (PARTIAL THROMBOPLASTIN TIME)   Result Value Ref Range     APTT 93.8 (H) 24.2 - 37.5 seconds    Narrative    Therapeutic range for unfractionated heparin is 60-100 seconds.   CBC/DIFF    Narrative    The following orders were created for panel order CBC/DIFF.  Procedure                               Abnormality         Status                     ---------                               -----------         ------                     CBC WITH CG:[id number]                Abnormal  Final result               MANUAL DIFF AND MORPHOLO.Marland KitchenMarland KitchenRK:[id number]  Abnormal            Final result                 Please view results for these tests on the individual orders.   BASIC METABOLIC PANEL   Result Value Ref Range    SODIUM 124 (L) 136 - 145 mmol/L    POTASSIUM 4.1 3.5 - 5.1 mmol/L    CHLORIDE 95 (L) 96 - 111 mmol/L    CO2 TOTAL 20 (L) 22 - 30 mmol/L    ANION GAP 9 4 - 13 mmol/L    CALCIUM 7.5 (L) 8.5 - 10.0 mg/dL    GLUCOSE 94 65 - 125 mg/dL    BUN 47 (H) 8 - 25 mg/dL    CREATININE 3.61 (H) 0.60 - 1.05 mg/dL    BUN/CREA RATIO 13 6 - 22    ESTIMATED GFR 16 (L) >=60 mL/min/BSA   PHOSPHORUS   Result Value Ref Range    PHOSPHORUS 6.9 (H) 2.4 - 4.7 mg/dL   MAGNESIUM   Result Value Ref Range    MAGNESIUM 2.5 1.8 - 2.6 mg/dL   PT/INR daily x 7   Result Value Ref Range    PROTHROMBIN TIME 23.6 (H) 9.1 - 13.9 seconds    INR 2.02 (H) 0.80 - 1.20    Narrative    Coumadin therapy INR range for Conventional Anticoagulation is 2.0 to 3.0 and for Intensive Anticoagulation 2.5 to 3.5.   CBC WITH DIFF   Result Value Ref Range    WBC 10.7 3.7 - 11.0 x10^3/uL    RBC 3.34 (L) 3.85 - 5.22 x10^6/uL    HGB 8.9 (L) 11.5 - 16.0 g/dL    HCT 27.1 (L) 34.8 - 46.0 %    MCV 81.1 78.0 - 100.0 fL    MCH 26.6 26.0 - 32.0 pg    MCHC 32.8 31.0 - 35.5 g/dL    RDW-CV 19.8 (H) 11.5 - 15.5 %    PLATELETS 348 150 - 400 x10^3/uL    MPV 8.7 8.7 - 12.5 fL   MANUAL DIFF AND MORPHOLOGY-SYSMEX   Result Value Ref Range    NEUTROPHIL % 79 %    LYMPHOCYTE %  11 %    MONOCYTE % 7 %    EOSINOPHIL % 1 %    BASOPHIL % 1 %     METAMYELOCYTE %  1 %    NEUTROPHIL # 8.45 (H) 1.50 - 7.70 x10^3/uL    LYMPHOCYTE # 1.18 1.00 - 4.80 x10^3/uL    MONOCYTE # 0.75 0.20 - 1.10 x10^3/uL    EOSINOPHIL # 0.11 <=0.50 x10^3/uL    BASOPHIL # 0.11 <=0.20 x10^3/uL    ANISOCYTOSIS 2+/Moderate (A) None   PT/INR - ONCE   Result Value Ref Range    PROTHROMBIN TIME 30.4 (H) 9.1 - 13.9 seconds    INR 2.59 (H) 0.80 - 1.20    Narrative    Coumadin therapy INR range for Conventional Anticoagulation is 2.0 to 3.0 and for Intensive Anticoagulation 2.5 to 3.5.     CT CHEST ABDOMEN PELVIS WO IV CONTRAST    Result Date: 02/05/2021  Impression 1. Bilateral airspace opacities, some of which are cavitary likely representing septic emboli. More confluent airspace opacity is noted dependently at both lung bases and may represent a component of aspiration pneumonia. 2. Hepatosplenomegaly. 3. Trace  ascites in the pelvis and mild anasarca.     XR ABD SUPINE    Result Date: 02/16/2021  Impression Persistent mild ileus type bowel gas pattern, stable from the prior study.    XR ABD SUPINE    Result Date: 02/15/2021  Impression Moderate gaseous distention multiple loops of small bowel in the central abdomen. Bowel gas pattern consistent with ileus. Close attention to follow-up is recommended.    CT BRAIN WO IV CONTRAST    Result Date: 02/05/2021  Impression No evidence of acute intracranial process.    MRI BRAIN WO CONTRAST    Result Date: 02/07/2021  Impression 1.No acute intracranial abnormality. 2.Bilateral mastoid effusions.    MRI SPINE CERVICAL WO CONTRAST    Result Date: 02/10/2021  Impression 1.Within the confines of an unenhanced study, there is no evidence of discitis-osteomyelitis or fluid collections in the cervical, thoracic and lumbar spine. 2.No cord signal abnormality or cord compression. 3.No spinal canal or neural foraminal stenosis. 4.Incidental low-lying conus at L3-L4. No associated fibrofatty lesion of the filum terminale, dysraphism of the imaged spine or  myelomeningocele. No convincing dorsal dermal sinus tract. 5.Pulmonary septic-embolic disease is better assessed on the prior CT chest from 02/05/2021.    MRI SPINE THORACIC WO CONTRAST    Result Date: 02/10/2021  Impression 1.Within the confines of an unenhanced study, there is no evidence of discitis-osteomyelitis or fluid collections in the cervical, thoracic and lumbar spine. 2.No cord signal abnormality or cord compression. 3.No spinal canal or neural foraminal stenosis. 4.Incidental low-lying conus at L3-L4. No associated fibrofatty lesion of the filum terminale, dysraphism of the imaged spine or myelomeningocele. No convincing dorsal dermal sinus tract. 5.Pulmonary septic-embolic disease is better assessed on the prior CT chest from 02/05/2021.    MRI SPINE LUMBOSACRAL WO CONTRAST    Result Date: 02/10/2021  Impression 1.Within the confines of an unenhanced study, there is no evidence of discitis-osteomyelitis or fluid collections in the cervical, thoracic and lumbar spine. 2.No cord signal abnormality or cord compression. 3.No spinal canal or neural foraminal stenosis. 4.Incidental low-lying conus at L3-L4. No associated fibrofatty lesion of the filum terminale, dysraphism of the imaged spine or myelomeningocele. No convincing dorsal dermal sinus tract. 5.Pulmonary septic-embolic disease is better assessed on the prior CT chest from 02/05/2021.    XR ABD X-RAY CHECK DOBHOFF PLACEMENT    Result Date: 02/06/2021  Impression 1.Appropriately positioned enteric tube with tip in the gastric body. 2.Interval retraction of esophageal temperature probe with tip at the level of the GE junction.    XR ABD X-RAY CHECK DOBHOFF PLACEMENT    Result Date: 02/06/2021  Impression Enteric tube across the diaphragm with tip overlying the distal body of the stomach. Side-port is 9 cm caudal to the diaphragmatic hiatus. The esophageal temperature probe also crosses the diaphragm with tip projecting over the upper sacrum. Subsequent  radiograph demonstrates significant retraction of the esophageal temperature probe.    XR AP MOBILE CHEST    Result Date: 02/18/2021  Impression Unchanged bilateral pleural effusions and airspace opacities compatible with known septic pulmonary emboli.    XR AP MOBILE CHEST    Result Date: 02/17/2021  Impression Unchanged bilateral pleural effusions with associated atelectasis/consolidation.     XR AP MOBILE CHEST    Result Date: 02/16/2021  Impression Stable bilateral lower lung opacities.    XR AP MOBILE CHEST    Result Date: 02/15/2021  Impression Stable lung aeration.     XR AP MOBILE CHEST  Result Date: 02/14/2021  Impression 1.Persistent patchy nodular areas of consolidation bilaterally with small bilateral pleural effusions and bibasilar atelectasis/consolidation, slightly more prominent than on the prior examination. 2.Stable cardiac enlargement and mild central vascular congestion.    XR CHEST AP MOBILE    Result Date: 02/13/2021  Impression Tiny right pleural effusion. Postoperative changes.Interval median sternotomy and placement of nasogastric tube, endotracheal tube and replaced right central line.     XR AP MOBILE CHEST    Result Date: 02/11/2021  Impression Stable lung aeration with bilateral patchy and cavitary opacities.    XR AP MOBILE CHEST    Result Date: 02/10/2021  Impression Stable lung aeration with cavitary appearance as over the right upper and lower lung zones, suspect septic emboli.     XR AP MOBILE CHEST    Result Date: 02/09/2021  Impression 1.Persistent subtle patchy bilateral airspace opacities with nonspecific increased hazy opacities over the right mid/lower lung zone. 2.Question of new small cavitary lesion within the right upper lung zone. See image annotation. 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 CT evaluation should be considered for further characterization.    XR AP MOBILE CHEST    Result Date: 02/08/2021  Impression 1.Subtle patchy  bilateral airspace opacities are unchanged. No new cardiopulmonary process. 2.Esophageal probe tip projects over the cervical esophagus. Repositioning is recommended. Other support devices as above.    XR AP MOBILE CHEST    Result Date: 02/07/2021  Impression Minor air space opacities unchanged in position. No lobar consolidation or pleural effusions.    XR AP MOBILE CHEST    Result Date: 02/06/2021  Impression There are stable support devices with esophageal temperature probe tip at the level of the gastroesophageal junction. Slight retraction is recommended. Mixed interstitial and airspace opacities and cavitary opacities are redemonstrated. No visible pneumothorax.    XR AP MOBILE CHEST    Result Date: 02/06/2021  Impression Interval placement of the right IJ central venous catheter, tip projecting over right atrium. No pneumothorax.    XR AP MOBILE CHEST    Result Date: 02/05/2021  Impression 1.Few nodular opacities with possible cavitation, concerning for infectious/inflammatory process. 2.Endotracheal tube tip projects about 2 cm from the carina.    CT FACIAL BONES WO IV CONTRAST    Result Date: 02/05/2021  Impression Multiple mandibular dental caries with periapical lucencies surrounding the root fragments of the first premolars. Dental follow-up recommended.   Education Documentation  triamcinolone acetonide, taught by Deatra James, RN at 02/18/2021  3:34 PM.  Learner: Patient  Readiness: Acceptance  Method: Explanation  Response: Needs Reinforcement  Comment: Patient amenable to education but forgetful.    rifampin, taught by Deatra James, RN at 02/18/2021  3:34 PM.  Learner: Patient  Readiness: Acceptance  Method: Explanation  Response: Needs Reinforcement  Comment: Patient amenable to education but forgetful.    metoprolol tartrate, taught by Deatra James, RN at 02/18/2021  3:34 PM.  Learner: Patient  Readiness: Acceptance  Method: Explanation  Response: Needs Reinforcement  Comment: Patient  amenable to education but forgetful.    sennosides/docusate sodium, taught by Deatra James, RN at 02/18/2021  3:34 PM.  Learner: Patient  Readiness: Acceptance  Method: Explanation  Response: Needs Reinforcement  Comment: Patient amenable to education but forgetful.    lidocaine/menthol, taught by Deatra James, RN at 02/18/2021  3:34 PM.  Learner: Patient  Readiness: Acceptance  Method: Explanation  Response: Needs Reinforcement  Comment: Patient amenable to education but forgetful.  magnesium hydroxide, taught by Deatra James, RN at 02/18/2021  3:34 PM.  Learner: Patient  Readiness: Acceptance  Method: Explanation  Response: Needs Reinforcement  Comment: Patient amenable to education but forgetful.    mupirocin, taught by Deatra James, RN at 02/18/2021  3:34 PM.  Learner: Patient  Readiness: Acceptance  Method: Explanation  Response: Needs Reinforcement  Comment: Patient amenable to education but forgetful.    aspirin, taught by Deatra James, RN at 02/18/2021  3:34 PM.  Learner: Patient  Readiness: Acceptance  Method: Explanation  Response: Needs Reinforcement  Comment: Patient amenable to education but forgetful.    polyethylene glycol 3350, taught by Deatra James, RN at 02/18/2021  3:34 PM.  Learner: Patient  Readiness: Acceptance  Method: Explanation  Response: Needs Reinforcement  Comment: Patient amenable to education but forgetful.    senna leaf extract, taught by Deatra James, RN at 02/18/2021  3:34 PM.  Learner: Patient  Readiness: Acceptance  Method: Explanation  Response: Needs Reinforcement  Comment: Patient amenable to education but forgetful.    docusate sodium, taught by Deatra James, RN at 02/18/2021  3:34 PM.  Learner: Patient  Readiness: Acceptance  Method: Explanation  Response: Needs Reinforcement  Comment: Patient amenable to education but forgetful.    Education Comments  No comments found.        Problem: Adult Inpatient Plan of Care  Goal: Plan of Care  Review  Outcome: Ongoing (see interventions/notes)  Flowsheets  Taken 02/18/2021 1535  Plan of Care Reviewed With: patient  Progress: improving  Taken 02/18/2021 0800  Plan of Care Reviewed With: patient  Note: Possible transfer to stepdown 9/5  Goal: Patient-Specific Goal (Individualized)  Outcome: Ongoing (see interventions/notes)  Flowsheets (Taken 02/18/2021 0800)  Individualized Care Needs: Ambulate TID/QID, Pain Control  Anxieties, Fears or Concerns: Wants to walk  Patient-Specific Goals (Include Timeframe): Transfer to infusion service for ABX therapy  Goal: Absence of Hospital-Acquired Illness or Injury  Outcome: Ongoing (see interventions/notes)  Intervention: Identify and Manage Fall Risk  Recent Flowsheet Documentation  Taken 02/18/2021 1600 by Deatra James, RN  Safety Promotion/Fall Prevention:  . motion sensor pad activated  . safety round/check completed  Taken 02/18/2021 1500 by Deatra James, RN  Safety Promotion/Fall Prevention: motion sensor pad activated  Taken 02/18/2021 1400 by Deatra James, RN  Safety Promotion/Fall Prevention:  . motion sensor pad activated  . safety round/check completed  Taken 02/18/2021 1300 by Deatra James, RN  Safety Promotion/Fall Prevention:  . motion sensor pad activated  . safety round/check completed  Taken 02/18/2021 1200 by Deatra James, RN  Safety Promotion/Fall Prevention:  . motion sensor pad activated  . safety round/check completed  Taken 02/18/2021 1100 by Deatra James, RN  Safety Promotion/Fall Prevention:  . motion sensor pad activated  . safety round/check completed  Taken 02/18/2021 1000 by Deatra James, RN  Safety Promotion/Fall Prevention:  . motion sensor pad activated  . safety round/check completed  Taken 02/18/2021 0900 by Deatra James, RN  Safety Promotion/Fall Prevention:  . motion sensor pad activated  . safety round/check completed  Taken 02/18/2021 0800 by Deatra James, RN  Safety Promotion/Fall Prevention:  .  activity supervised  . fall prevention program maintained  . muscle strengthening facilitated  . nonskid shoes/slippers when out of bed  . motion sensor pad activated  . safety round/check completed  Intervention: Prevent Skin Injury  Recent Flowsheet Documentation  Taken 02/18/2021 1600 by Deatra James, RN  Body Position:  . sitting  . weight shift assistance  provided  Taken 02/18/2021 1400 by Deatra James, RN  Body Position:  . sitting  . weight shift assistance provided  Taken 02/18/2021 1200 by Deatra James, RN  Body Position:  . sitting  . weight shift assistance provided  . legs elevated  Taken 02/18/2021 1000 by Deatra James, RN  Body Position:  . sitting  . legs elevated  Taken 02/18/2021 0800 by Deatra James, RN  Body Position:  . sitting  . legs elevated  Intervention: Prevent and Manage VTE (Venous Thromboembolism) Risk  Recent Flowsheet Documentation  Taken 02/18/2021 0800 by Deatra James, RN  VTE Prevention/Management:  . anticoagulant therapy maintained  . dorsiflexion/plantar flexion performed  Goal: Optimal Comfort and Wellbeing  Outcome: Ongoing (see interventions/notes)  Intervention: Provide Person-Centered Care  Recent Flowsheet Documentation  Taken 02/18/2021 1600 by Deatra James, RN  Trust Relationship/Rapport:  . care explained  . choices provided  . empathic listening provided  . emotional support provided  . questions answered  . questions encouraged  . reassurance provided  . thoughts/feelings acknowledged  Taken 02/18/2021 1200 by Deatra James, RN  Trust Relationship/Rapport:  . care explained  . emotional support provided  . choices provided  . empathic listening provided  . questions answered  . questions encouraged  . reassurance provided  . thoughts/feelings acknowledged  Taken 02/18/2021 0800 by Deatra James, RN  Trust Relationship/Rapport:  . care explained  . choices provided  . emotional support provided  . questions encouraged  . questions  answered  . empathic listening provided  . reassurance provided  . thoughts/feelings acknowledged  Goal: Rounds/Family Conference  Outcome: Ongoing (see interventions/notes)     Problem: Skin Injury Risk Increased  Goal: Skin Health and Integrity  Outcome: Ongoing (see interventions/notes)  Intervention: Optimize Skin Protection  Recent Flowsheet Documentation  Taken 02/18/2021 0800 by Deatra James, RN  Pressure Reduction Techniques:  . (L,M,H,VH) Maximal Remobilization  . (L,M,H,VH) Manage Moisture, Nutrition & Shear  . (H,VH) Increase Frequency of Turning  . (H,VH) Supplemented with Small Shifts  . frequent weight shift encouraged  . heels elevated off bed  . rest period provided between sit times  . weight shift assistance provided  Pressure Reduction Devices:  . (L.M,H,VH) Heel offloading device utilized  . (L.M,H,VH) Pressure redistributing mattress utilized  . (L.M,H,VH) Specialty Bed utilized  . (L.M,H,VH) Pressure Redistributing Chair cushion utilized  . (M,H,VH) Use Repositioning Devices or Pillows  . feet on footrest/footstool  . heels elevated off bed  Skin Protection:  . adhesive use limited  . antimicrobial wipes  . electrode sites changed  . incontinence pads utilized  . pulse oximeter probe site changed  . preventative decubiti skin protection foam dressing applied/intact  . transparent dressing maintained  . tubing/devices free from skin contact     Problem: Inability to Wean (Mechanical Ventilation, Invasive)  Goal: Mechanical Ventilation Liberation  Intervention: Promote Extubation and Dentist  Recent Flowsheet Documentation  Taken 02/18/2021 0800 by Deatra James, RN  Sleep/Rest Enhancement:  . natural light exposure provided  . noise level reduced     Problem: Ventilator-Induced Lung Injury (Mechanical Ventilation, Invasive)  Goal: Absence of Ventilator-Induced Lung Injury  Intervention: Prevent Ventilator-Associated Pneumonia  Recent Flowsheet  Documentation  Taken 02/18/2021 0800 by Deatra James, RN  Oral Care: mouth wash rinse     Problem: Infection Progression (Sepsis)  Goal: Absence of Infection Signs and Symptoms  Outcome: Ongoing (see interventions/notes)  Intervention: Initiate Sepsis Management  Recent Flowsheet Documentation  Taken 02/18/2021  0800 by Deatra James, RN  Isolation Precautions: contact precautions maintained  Intervention: Promote Stabilization  Recent Flowsheet Documentation  Taken 02/18/2021 0800 by Deatra James, RN  Fever Reduction/Comfort Measures:  . lightweight bedding  . lightweight clothing  Intervention: Promote Recovery  Recent Flowsheet Documentation  Taken 02/18/2021 1600 by Deatra James, RN  Activity Management: up in chair  Taken 02/18/2021 1400 by Deatra James, RN  Activity Management:  . ambulated in hall  . up in chair  Taken 02/18/2021 1200 by Deatra James, RN  Activity Management:  . ambulated to bathroom  . up in chair  Taken 02/18/2021 1000 by Deatra James, RN  Activity Management:  . ambulated in hall  . up in chair  Taken 02/18/2021 0800 by Deatra James, RN  Sleep/Rest Enhancement:  . natural light exposure provided  . noise level reduced  Activity Management: up in chair     Problem: Fall Injury Risk  Goal: Absence of Fall and Fall-Related Injury  Outcome: Ongoing (see interventions/notes)  Intervention: Promote Injury-Free Environment  Recent Flowsheet Documentation  Taken 02/18/2021 1600 by Deatra James, RN  Safety Promotion/Fall Prevention:  . motion sensor pad activated  . safety round/check completed  Taken 02/18/2021 1500 by Deatra James, RN  Safety Promotion/Fall Prevention: motion sensor pad activated  Taken 02/18/2021 1400 by Deatra James, RN  Safety Promotion/Fall Prevention:  . motion sensor pad activated  . safety round/check completed  Taken 02/18/2021 1300 by Deatra James, RN  Safety Promotion/Fall Prevention:  . motion sensor pad activated  . safety  round/check completed  Taken 02/18/2021 1200 by Deatra James, RN  Safety Promotion/Fall Prevention:  . motion sensor pad activated  . safety round/check completed  Taken 02/18/2021 1100 by Deatra James, RN  Safety Promotion/Fall Prevention:  . motion sensor pad activated  . safety round/check completed  Taken 02/18/2021 1000 by Deatra James, RN  Safety Promotion/Fall Prevention:  . motion sensor pad activated  . safety round/check completed  Taken 02/18/2021 0900 by Deatra James, RN  Safety Promotion/Fall Prevention:  . motion sensor pad activated  . safety round/check completed  Taken 02/18/2021 0800 by Deatra James, RN  Safety Promotion/Fall Prevention:  . activity supervised  . fall prevention program maintained  . muscle strengthening facilitated  . nonskid shoes/slippers when out of bed  . motion sensor pad activated  . safety round/check completed     Problem: Acute Rehab Services Goal & Intervention Plan  Goal: Bed Mobility Goal  Description: Stand Alone Therapy Goal  Outcome: Outcome Achieved  Goal: Gait Training Goal  Description: Stand Alone Therapy Goal  Outcome: Ongoing (see interventions/notes)  Goal: Interprofessional Goal  Description: Stand Alone Therapy Goal  Outcome: Ongoing (see interventions/notes)  Goal: LB Dressing Goal  Description: Stand Alone Therapy Goal  Outcome: Outcome Achieved  Goal: Occupational Therapy Goals  Description: Stand Alone Therapy Goal  Outcome: Ongoing (see interventions/notes)  Goal: Physical Therapy Goal  Description: Stand Alone Therapy Goal  Outcome: Ongoing (see interventions/notes)  Goal: Toileting Goal  Description: Stand Alone Therapy Goal  Outcome: Ongoing (see interventions/notes)  Goal: Goal Transfer Training  Description: Stand Alone Therapy Goal  Outcome: Outcome Achieved  Goal: UB Dressing Goal  Description: Stand Alone Therapy Goal  Outcome: Outcome Achieved

## 2021-02-18 NOTE — Ancillary Notes (Signed)
Visited patient for ambulation, did not complete as patient recently ambulated with nursing staff.    Will follow-up with patient for future cardiac rehab services.    Patient in chair with call bell within reach.    Nick Keonte Daubenspeck BS, ES  Cardiac Rehabilitation  #[id number]

## 2021-02-18 NOTE — Care Plan (Signed)
[place]  Occupational Therapy Progress Note    Patient Name: [name] [name]  Date of Birth: 1986-05-06  Height:  165.1 cm ('5\' 5"'$ )  Weight:  67.4 kg (148 lb 9.4 oz)  Room/Bed: 19/A  Payor: Alecia [place] MEDICAID / Plan: [employer] [place] MEDICAID / Product Type: Medicaid [facility] /     Assessment:    Patient tolerated OT tx session well. Patient denied pain and only c/o SOB during functional mobility of ~150 ft with SBA. Patient completed UB grooming tasks with SUA/SBA. From OT perspective, once medically stable, anticipate home with 24/7 assist.      Discharge Needs:   Equipment Recommendation: shower chair    The patient presents with mobility limitations due to impaired balance, impaired strength, and impaired functional activity tolerance that significantly impair/prevent patient's ability to participate in mobility-related activities of daily living (MRADLs) including  bathing. This functional mobility deficit can be sufficiently resolved with the use of a shower chair   in order to decrease the risk of falls, morbidity, and mortality in performance of these MRADLs.  Patient is able to safely use this assistive device.    Discharge Disposition:  home with 24/7 assistance       Plan:   Continue to follow patient according to established plan of care.  The risks/benefits of therapy have been discussed with the patient/caregiver and he/she is in agreement with the established plan of care.     Subjective & Objective:      02/18/21 [id number]   Therapist Pager   OT Assigned/ Pager # Abby [phone]   Rehab Session   Document Type therapy progress note (daily note)   Total OT Minutes: 14   Patient Effort good   Symptoms Noted During/After Treatment fatigue;shortness of breath   Symptoms Noted Comment Patient cued for breathing techniques and pacing self d/t SOB   General Information   Patient Profile Reviewed yes   General Observations of Patient Patient alert and agreeable to OT tx session. RN ok for  session.   Medical Lines PIV Line;Telemetry   Respiratory Status room air   Existing Precautions/Restrictions fall precautions;full code;contact isolation   Pre Treatment Status   Pre Treatment Patient Status Patient sitting in bedside chair or w/c;Call light within reach;Telephone within reach;Sitter select activated;Nurse approved session   Support Present Pre Treatment  None   Communication Pre Treatment  Nurse   Mutuality/Individual Preferences   Individualized Care Needs OOB w A x1; promote independence with all self-care tasks   Plan of Care Reviewed With patient   Vital Signs   Pre-Treatment Heart Rate (beats/min) 97   Post-treatment Heart Rate (beats/min) 103   Pre-Treatment Resp Rate (breaths/min) 22   Post-treatment Resp Rate (breaths/min) 27   Pre Treatment BP 120/90   Pre SpO2 (%) 90   O2 Delivery Pre Treatment room air   Post SpO2 (%) 91   O2 Delivery Post Treatment room air   Vitals Comment VSS throughout despite SOB   Pain Assessment   Pretreatment Pain Rating 0/10 - no pain   Posttreatment Pain Rating 0/10 - no pain   Coping/Psychosocial   Observed Emotional State calm;cooperative   Verbalized Emotional State acceptance   Coping/Psychosocial Response Interventions   Plan Of Care Reviewed With patient   Cognitive Assessment/Interventions   Behavior/Mood Observations alert;cooperative   Orientation Status oriented x 4   Attention WNL/WFL   Follows Commands [facility]   Mobility Assessment/Training   Mobility Comment Patient completed  functional mobility of ~150 ft with CGA and x1 standing rest break to recover from SOB.   Bed Mobility Assessment/Treatment   Comment NT as patient in chair pre and post session.   Transfer Assessment/Treatment   Sit-Stand Independence stand-by assistance   Stand-Sit Independence stand-by assistance   Transfer Impairments endurance   ADL Assessment/Intervention   ADL Comments Patient fully dressed UB/LB prior to arrival   Grooming Assessment/Training   Position supported sitting    Independence Level set up required;supervision required   Impairments activity tolerance impaired   Comment SUA/SPV for hair and washing face   Balance Skill Training   Comment No AD   Sitting Balance: Static fair + balance   Sitting, Dynamic (Balance) fair + balance   Sit-to-Stand Balance fair + balance   Standing Balance: Static fair balance   Standing Balance: Dynamic fair balance   Systems Impairment Contributing to Balance Disturbance musculoskeletal   Identified Impairments Contributing to Balance Disturbance decreased strength   Post Treatment Status   Post Treatment Patient Status Patient sitting in bedside chair or w/c;Call light within reach;Telephone within reach;Sports administrator Post Treatment Comment Pt performance   Clinical Impression   Functional Level at Time of Session Patient tolerated OT tx session well. Patient denied pain and only c/o SOB during functional mobility of ~150 ft with SBA. Patient completed UB grooming tasks with SUA/SBA. From OT perspective, once medically stable, anticipate home with 24/7 assist.   Anticipated Equipment Needs at Discharge shower chair   Anticipated Discharge Disposition home with 24/7 assistance       Therapist:   Prudencio Pair, OTR/L  Pager #: [phone]

## 2021-02-18 NOTE — Progress Notes (Signed)
[name]  HVI Critical Care Consult/Progress Note     [name] [name], [name] [name], 35 y.o. female  Date of Birth: May 06, 1986  Medical Record Number:  [id number]   Inpatient Admission Date: 02/05/2021   Hospital Day:  LOS: 0 days      Chief Complaint: Altered Mental Status   History of Present Illness: Per previous providers: "Pt is a 35 yo female with a significant PMH for Smoking and IVDU presented to [name] with AMS on 01/31/21. She was found to have a drug overdose, aspiration PNA requiring intubation, MRSA bacteremia, large TV endocarditis with moderate TR and elevated systemic pulmonary pressures (80mhg), AKI requiring HD, anemia requiring PRBC and thrombocytopenia. Urine drug screen + for heroin and methamphetamines.     [facility]."   POD #/ Procedure: 8/22: [facility]  8/30: On pump beating heart TVR (Epic 33)  PFO colsure   Hospital Course/Major events:  8/22: See above.   -Prop for sedation. Agitated; MAE; does not follow. Repeat CT Head/CAP. Hemodynamics stable. Intubated. Repeated blood cultures. Continue Vanc.   8/23: had pan CT 8/22, still not awakening/folowing commands on precedex  But agitated. MRI brain ordered, started on CRRT  8/24: remains on CRRT, MRI Brain no abnormality, following commands on dexmedetomidine, quetiapine, prn dilaudid  8/25 brocnh today for increased ETT secretions,plan for OR early next week, OMFS following for dental root extriction.  Pt self-extubated this afternoon.  Doing well on NC.  8/26: Intermittent delirium. Weak. Continue Seroquel.  Hemodynamics stable. NC. Aggressive pulm toilet. Continue nebs. Speech to see. Dental procedure today. Continues + BC. ID following; MRI CTLS ordered. Continue antibiotics. Surgical plan TBA.   8/27:  MRI completed today. Intermittent dex. Surgical plan TBA. HD today per nephology. Bedside speech and swallow. Blood  repeat Cx tomorrow.   8/28 repeat TTE today for OR later this week, HD holiday will give 200 mg Lasix for diuresis (followed by diuril 5061mif no response w/lasix) and D/C [place] for line holiday  8/29: OR tomorrow. Agitation. Seroquel. Hemodynamics stable. HD today prior to surgery. New line placed 2/2 line holiday. 2 u PRBC pre op optimization.   8/30: OR for TVR, PFO closure, out of OR intubated, levophed infusing, normal EF  8/31: OBT, deline, continue CT, HD today, repeat BC  9/1: hypertensive; started lopressor 6.2580mID, repeat BC, lasix 80 IV BID started, holding HD, repeat BC   9/2: Decrease Seroquel, increase BB, d/c pacer wires and groin bumper, lasix 200, Diuril 500, place trialysis line, give 1st dose coumadin tonight, repeat BC [facility]9/3: switched to daptomycin from vancomycin due to rash.   9/4: continues on line holiday, getting out of bed with no new complaints.    Past Medical History: No past medical history on file.    Past Surgical History: No past surgical history on file.   Social History:  Social History            Socioeconomic History   . Marital status: Divorced       Spouse name: Not on file   . Number of children: Not on file   . Years of education: Not on file   . Highest education level: Not on file   Occupational History   . Not on file   Tobacco Use   . Smoking status: Not on file   . Smokeless tobacco: Not on file  Substance and Sexual Activity   . Alcohol use: Not on file   . Drug use: Not on file   . Sexual activity: Not on file   Other Topics Concern   . Not on file   Social History Narrative   . Not on file      Social Determinants of Health      Financial Resource Strain: Not on file   Food Insecurity: Not on file   Transportation Needs: Not on file   Physical Activity: Not on file   Stress: Not on file   Intimate Partner Violence: Not on file   Housing Stability: Not on file       Family History: Family Medical History:       None                Allergies: No Known Allergies    ROS: Continue to have improved pain and shortness of breath   No other new complaints       Problem List:       Patient Active Problem List   Diagnosis   . Endocarditis         Medications: Reviewed      Last VS:    .  Filed Vitals:    02/18/21 0400 02/18/21 0500 02/18/21 0600 02/18/21 0700   BP: 119/74 137/82  (!) 151/97   Pulse: 91 93 94 96   Resp: (!) 27   (!) 30   Temp: 36.9 C (98.4 F)      SpO2: 95% 94%  95%        Physical Exam  Vitals reviewed.   Constitutional:       Appearance: She is ill-appearing.      Comments: Critically ill appearing female, no acute distress, becomes restless at times   HENT:      Head: Normocephalic.      Mouth/Throat:      Mouth: Mucous membranes are moist.   Eyes:      Extraocular Movements: Extraocular movements intact.      Conjunctiva/sclera: Conjunctivae normal.      Pupils: Pupils are equal, round, and reactive to light.   Cardiovascular:      Rate and Rhythm: Regular rhythm.      Pulses: Normal pulses.   Pulmonary:      Comments: Diminished breath sounds throughout, shallow inspiratory effort    Abdominal:      General: Abdomen is flat. There is no distension.      Palpations: Abdomen is soft. There is no mass.      Hernia: No hernia is present.      Comments: Hypoactive bowel sounds   Musculoskeletal:         General: Swelling present. Normal range of motion.      Cervical back: No rigidity.      Right lower leg: Edema present.      Left lower leg: Edema present.   Skin:     General: Skin is warm and dry.   Neurological:      General: No focal deficit present.      Mental Status: She is alert and oriented to person, place, and time.   Psychiatric:      Comments: Anxious              _0 I have reviewed the patient's vitals signs, the nursing notes, the physician's notes and other progress notes      _1 I have reviewed the laboratory and imaging  data     _0 I have discussed this case with CCM team, SCT         Systems Based Assessment and Plan:      Neurologic:  Data/Assessment Reviewed    Diagnosis Altered mental status 2/2 encephalopathy   Overdose 2/2 IVDU  IVD withdrawal  Back pain    Course:  improving    Plan Continue oral oxy   Continue Seroquel qHS  Pain/Psych consult    MRI Brain 8/23 negative  MRI of the cervical, thoracic, and lumbosacral spine completed to r/o abscess; negative   Day/night policy, reorientation as needed  PT/OT/Cardiac rehab   Serial neuro checks        Cardiovascular:  Data/Assessment Most Recent Hemodynamics: Reviewed   Cardiovascular support: N/A       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; s/p PFO closure  Moderate TR  Mild Pulmonary HTN  Hypertension    Course Improving    Plan Continue lopressor 12.22m BID; titrate as tolerated   Coumadin 126mtonight   Continue heparin gtt per SCT   Continue ASA       Respiratory:  Data/Assessment: CXR: Reviewed   Most recent VBG: reviewed  Vent Settings: NA      Diagnosis Acute respiratory failure 2/2 Aspiration PNA and altered mental status  Septic pulmonary emboli  Smoker  MRSA PNA   Mechanical ventilation post op      Course stable   Plan F/U BAL from 8/30 + MRSA   FIO2 wean as tolerated for O2 sats>92%  Pulm toilet; scheduled nebs  Monitor CT output, continue to suction  Repeat CXR in am       Renal:  Data/Assessment: Reviewed    Diagnosis Acute kidney injury - multifactorial: septic shock 2/2 MRSA TV endocarditis, IVDU  Hypervolemia and anasarca   Course As expected   Plan Restarted on lasix 80 mg IV BID   No indication for RRT at this time   Last HD (8/31)   Daily BMP  Replacing electrolytes per order  Strict I&O  Renally adjust medications   Avoid nephrotoxic agents       Gastrointestinal:  Data/Assessment: Protonix for stress ulcer prophylaxis   Diet:  Taking PO     Bowel Regimen:  Senna  Miralax     Diagnosis Dysphagia  Enlarged liver  Distended gallbladder with mild elevation in Alk phos and bilirubin  constipation   Course  Stable    Plan Increased bowel regimen;  give MOM and suppository   Received relastor 9/1  Advance diet as tolerated   Continue stress ulcer prophylaxis   Speech/bedside to evaluate swallow ; passed       Endocrine:  Data/Assessment Last three Fingerstick glucose: Reviewed   Last HbA1C: NA  Most recent Thyroid results: 2.3      Diagnosis Hyperglycemia    Course Stable   Plan Target Goal 140-180  SSI Protocol PRN       Hematologic:  Data/Assessment:  Reviewed    Diagnosis Anemia of chronic disease and Thrombocytopenia   Course Stable   Plan SCDs (Sequential Compression Device)  For DVT Prophylaxis  Continue heparin gtt for TVR   coumadin tonight 8m28m Continue to closely monitor cbc/coags   Monitor CT output      Infectious Diseases:  Data/Assessment Tmax last 24 hrs: Reviewed        Diagnosis MRSA TV Endocarditis  MRSA PNA    Course guarded   Plan  Obtain repeat BC today (9/2)   ID asked to reevaluate patient today   Plan for IV Vanc per protocol x 6 weeks from negative BC  Due to rash will transition to daptomycin 74m/kg q48h   BAL 8/30 + MRSA   [facility] = + MRSA   BC 9/1 = + GPC   BC 9/2 = NGTD       Antibiotic therapy  Agent:  Vancomycin  Indication: MRSA      Will continue to monitor for additional s/s of infection         Family Communication and Disposition:  Data/Assessment Patient updated with plan of care     Dipso: Transfer to SDU     EDerenda Mis MD PGY VII  Fellow, Department of Pulmonary, Critical Care, and Sleep Medicine  [facility]   Patient was seen examined discussed with the cardiovascular ICU Team.  I agree with the note examination plan as outlined.  Patient is a 35year old female with history of IV drug abuse status post the tricuspid valve replacement for tricuspid endocarditis she has had a relatively uncomplicated hospital course and she is ready for step-down.  On examination she is chronically ill she is relatively comfortable and neurologically intact. Heart is regular incision is clean she does have a murmur ,  Breath sounds are relatively clear, abdomen soft positive bowel sounds no palpable masses, nontender nondistended  No significant edema.  Impression:  History of IV drug abuse, endocarditis, status post tricuspid valve replacement, multifactorial anemia, postoperative pain which is well controlled.  Plan continue current antibiotics, incentive spirometry, subcutaneous heparin for DVT prophylaxis, weight step-down bed.  This si an E and M visit.

## 2021-02-18 NOTE — Consults (Signed)
INFECTIOUS DISEASE FOLLOW UP CONSULTATION    Patient Name: [name] [name] Number: [id number]  Date of Service: 02/18/2021  Date of Birth: 17-Oct-1985    Hospital Day:  LOS: 13 days     Reason for Consultation: MRSA native tricuspid valve endocarditis     Subjective: No overnight events noted. The patient reports she is feeling much better today. Her urine output is increasing. She feels like her rash is improving. She is afebrile and denies any abdominal pain, diarrhea, nausea.      Objective:  Physical Exam:  Current Vitals: BP (!) 151/97   Pulse 96   Temp 36.9 C (98.4 F)   Resp (!) 30   Ht 1.651 m ('5\' 5"'$ )   Wt 67.4 kg (148 lb 9.4 oz)   SpO2 95%   BMI 24.73 kg/m       Vitals in last 24 hours: Temp  Avg: 36.9 C (98.4 F)  Min: 36.7 C (98 F)  Max: 37.2 C (99 F)  MAP (Non-Invasive)  Avg: 86.1 mmHG  Min: 74 mmHG  Max: 106 mmHG  Pulse  Avg: 90.9  Min: 82  Max: 103  Resp  Avg: 24.6  Min: 17  Max: 33  SpO2  Avg: 94.4 %  Min: 51 %  Max: 100 %     General: No acute distress.  Chronically ill- appearing.  Eyes: Pupils equal round and reactive bilaterally.  No conjunctival injection or scleral icterus.  ENT: Face symmetrical.  Mucous membranes pink and moist.  No oral candidiasis or mucosal ulcers. Edentulous.   Neck: Supple and symmetrical.  Lungs: Clear to auscultation bilaterally.  No wheezes, rhonchi, or crackles.  Unlabored respirations.  Cardiovascular: Regular rate and rhythm.  Sternal wound site clean, no cellulitis noted.   Abdomen: Normoactive bowel sounds.  Nondistended, soft, and nontender to palpation.    Extremities: No joint erythema or swelling. 1+ edema.  Skin: Warm and dry. Extensive purpuric rash to bilateral lower and upper extremities, lower > upper  Neurologic: Alert and oriented X 3.   Moves all extremities without focal deficit.  Psychiatric: Appropriate affect and behavior.  Intact memory.  Fluent speech.     Current Antimicrobials:  Antibiotics (From admission, onward)      Start      Stop Route Frequency    02/10/21 1900  vancomycin (VANCOCIN) 500 mg in NS 100 mL IVPB         08/27 2353 IV ONCE    02/10/21 1016  Vancomycin IV Intermittent Dosing         -- N/A DAILY PRN    02/06/21 2000  vancomycin (VANCOCIN) 1 g in D5W 200 mL premix IVPB  Status:  Discontinued         08/27 1016 IV EVERY 24 HOURS    02/05/21 0226  Vancomycin IV Intermittent Dosing  Status:  Discontinued         08/23 1608 N/A DAILY PRN             Lines:  Patient Lines/Drains/Airways Status       Active Line / Dialysis Catheter / Dialysis Graft / Drain / Airway / Wound       Name Placement date Placement time Site Days    Peripheral IV Ultrasound guided;Extended dwell catheter Right Cephalic  (lateral side of arm);Forearm 02/12/21  0201  -- 6    Peripheral IV Ultrasound guided;Extended dwell catheter Anterior;Proximal;Right;Upper Arm 02/12/21  0204  -- 6    Midline  Double Lumen Lumen 1 Purple Lumen 2 Red Left;Brachial Vein NOT A CENTRAL LINE 02/15/21  1700  4 FR  2    Wound (Non-Surgical) Anterior;Right Elbow 02/05/21  0045  -- 13    Wound (Non-Surgical) Anterior;Right Finger 1;2;3;4;5 02/10/21  2000  -- 7    Wound (Non-Surgical) Anterior;Left Finger 1;2;3;4;5 02/10/21  2000  -- 7    Wound (Non-Surgical) Buttock 02/11/21  2000  -- 6    Surgical Incision Inner Mouth 02/09/21  --  -- 9    Surgical Incision Mediastinal Chest 02/13/21  --  -- 5    Surgical Incision Right Groin 02/13/21  --  -- 5                     Laboratory Studies:  CBC Differential   Recent Labs     02/16/21  1248 02/17/21  0319 02/18/21  0201   WBC 11.1* 10.0 10.7   HGB 9.2* 9.4* 8.9*   HCT 28.7* 29.1* 27.1*   PLTCNT 294 331 348   BANDS  --  2  --     Recent Labs     02/16/21  0327 02/17/21  0319 02/18/21  0201   PMNS 77 81 79   LYMPHOCYTES '7 8 11   '$ MONOCYTES '11 7 7   '$ EOSINOPHIL '2 1 1   '$ BASOPHILS 1  0.10 0  <0.10 1  0.11   PMNABS 8.01* 8.30* 8.45*   MONOSABS 1.14* 0.70 0.75   EOSABS 0.21 0.10 0.11      BMP LFTs   Recent Labs     02/17/21  1619  02/18/21  0202   SODIUM 125* 124*   POTASSIUM 4.3 4.1   CHLORIDE 96 95*   CO2 18* 20*   BUN 46* 47*   CREATININE 3.67* 3.61*   GLUCOSENF 93 94   ANIONGAP 11 9   BUNCRRATIO 13 13   GFR 16* 16*   CALCIUM 7.1* 7.5*   MAGNESIUM 2.5 2.5   PHOSPHORUS 6.4* 6.9*    No results found for this encounter   CoAgs Blood Gas:   Recent Labs     02/17/21  2257 02/18/21  0201   PROTHROMTME  --  23.6*   INR  --  2.02*   APTT 93.8*  --     No results found for this encounter    Cardiac Markers Lipid Panel   No results for input(s): TROPONINI, CKMB, MBINDEX, BNP in the last 72 hours. No results found for this encounter   Urine Analysis Other Labs   No results found for this encounter No results found for this encounter    Invalid input(s): PRL     Microbiology:  Hospital Encounter on 02/05/21 (from the past 96 hour(s))   ADULT ROUTINE BLOOD CULTURE, SET OF 2 ADULT BOTTLES (BACTERIA AND YEAST)    Collection Time: 02/15/21  8:06 AM    Specimen: Blood   Culture Result Status    BLOOD CULTURE, ROUTINE Abnormal Stain (AA) Final    BLOOD CULTURE, ROUTINE Methicillin Resistant Staphylococcus aureus (A) Final    GRAM STAIN Gram Positive Cocci/Clusters (A) Final    GRAM STAIN Gram Positive Cocci/Clusters (A) Final    Narrative    For susceptibility, see previous report.     ADULT ROUTINE BLOOD CULTURE, SET OF 2 ADULT BOTTLES (BACTERIA AND YEAST)    Collection Time: 02/15/21  8:06 AM    Specimen: Blood   Culture Result Status  BLOOD CULTURE, ROUTINE Abnormal Stain (AA) Final    BLOOD CULTURE, ROUTINE Methicillin Resistant Staphylococcus aureus (A) Final    GRAM STAIN Gram Positive Cocci/Clusters (A) Final    Narrative    For susceptibility, see previous report.     ADULT ROUTINE BLOOD CULTURE, SET OF 2 ADULT BOTTLES (BACTERIA AND YEAST)    Collection Time: 02/16/21  3:31 PM    Specimen: Blood   Culture Result Status    BLOOD CULTURE, ROUTINE No Growth 18-24 hrs. Preliminary   ADULT ROUTINE BLOOD CULTURE, SET OF 2 ADULT BOTTLES (BACTERIA AND  YEAST)    Collection Time: 02/16/21  3:39 PM    Specimen: Blood   Culture Result Status    BLOOD CULTURE, ROUTINE No Growth 18-24 hrs. Preliminary       No results found for any visits on 02/05/21 (from the past 24 hour(s)).    Imaging Studies:  Results for orders placed or performed during the hospital encounter of 02/05/21 (from the past 72 hour(s))   XR ABD SUPINE     Status: None    Narrative    [place]  Female, 35 years old.    XR ABD SUPINE performed on 02/15/2021 3:00 PM.    REASON FOR EXAM:  nausea    TECHNIQUE: 1 views/2 images submitted for interpretation.    COMPARISON:  Abdominal radiographs, CT chest abdomen pelvis 02/05/2021    FINDINGS:  There is moderate gaseous distention of multiple loops of small bowel throughout the central abdomen. No significant stool burden is appreciated. Median sternotomy wires are evident, with questioned small amount of pneumomediastinum. This is in keeping with patient's recent surgical procedure. Interval removal of multiple chest tubes. No acute bony abnormality is identified.      Impression    Moderate gaseous distention multiple loops of small bowel in the central abdomen. Bowel gas pattern consistent with ileus. Close attention to follow-up is recommended.   XR AP MOBILE CHEST     Status: None    Narrative    [place]qu[place]  Female, 35 years old.    XR AP MOBILE CHEST performed on 02/16/2021 5:40 AM.    REASON FOR EXAM:  s/p TVR    TECHNIQUE: 1 views/1 images submitted for interpretation.    COMPARISON:  02/15/2021.    FINDINGS: The mediastinal pleural drains have been removed. Stable median sternotomy wires. Stable cardiac silhouette. Stable bilateral lower lung opacities. No definite pneumothorax.      Impression    Stable bilateral lower lung opacities.   XR ABD SUPINE     Status: None    Narrative    Gigi Akhtar  Female, 35 years old.    XR ABD SUPINE performed on 02/16/2021 2:00 PM.    REASON FOR EXAM:  abdominal ileus    TECHNIQUE: 2 views/2 images submitted for  interpretation.    FINDINGS:  Air is present in mildly distended large and small bowel. No significant fecal retention is noted. Bowel gas is present at the level of the rectum.      Impression    Persistent mild ileus type bowel gas pattern, stable from the prior study.   XR AP MOBILE CHEST     Status: None    Narrative    XR AP MOBILE CHEST performed on 02/17/2021 5:53 AM    INDICATION: 35 years old Female  s/p TVR    TECHNIQUE: 1 view(s) of the chest; 1 image(s)    COMPARISON: Chest radiograph 02/16/2021.  FINDINGS: The heart size is normal. Bilateral pleural effusions with   associated atelectasis/consolidation, right greater than left are   unchanged. Left vascular access catheter terminates over the upper arm.        Impression    Unchanged bilateral pleural effusions with associated   atelectasis/consolidation.     XR AP MOBILE CHEST     Status: None (Preliminary result)    Narrative    XR AP MOBILE CHEST performed on 02/18/2021 5:42 AM    INDICATION: 35 years old Female  s/p TVR    TECHNIQUE: 1 view(s) of the chest; 1 image(s)    COMPARISON: Chest radiograph 02/17/2021.    FINDINGS: The heart size is normal. Postoperative changes from median sternotomy and tricuspid valve replacement are noted. Bilateral airspace opacities and pleural effusions are redemonstrated. No pneumothorax..        Impression    Unchanged bilateral pleural effusions and airspace opacities compatible with known septic pulmonary emboli.         Results for orders placed or performed during the hospital encounter of 02/05/21 (from the past 24 hour(s))   XR AP MOBILE CHEST     Status: None (Preliminary result)    Narrative    XR AP MOBILE CHEST performed on 02/18/2021 5:42 AM    INDICATION: 35 years old Female  s/p TVR    TECHNIQUE: 1 view(s) of the chest; 1 image(s)    COMPARISON: Chest radiograph 02/17/2021.    FINDINGS: The heart size is normal. Postoperative changes from median sternotomy and tricuspid valve replacement are noted. Bilateral  airspace opacities and pleural effusions are redemonstrated. No pneumothorax..        Impression    Unchanged bilateral pleural effusions and airspace opacities compatible with known septic pulmonary emboli.         Assessment:    This is a 35 y.o. female with a history of tobacco use, cleared hepatitis-C infection, nephrolithiasis and active injection drug use who was transferred to [facility] from [facility] on February 05, 2021 with MRSA native tricuspid valve endocarditis.  She presented to [facility] on January 31, 2021 with altered mental status, and was found to be in renal failure.  She was intubated and started on hemodialysis.  Blood and sputum cultures that grew MRSA.  On transfer to [place], a transthoracic echocardiogram confirmed a tricuspid valve vegetation.  CT of the chest shows bilateral airspace opacities some of which are cavitary likely representing septic emboli.  Blood cultures have remained persistently positive for MRSA. MRIs of the spine without contrast showed no evidence of discitis or epidural abscess. 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 OR cultures positive for MRSA as well. Repeat blood cultures pending from 02/14/2021 and  02/15/2021 remain positive. Blood cultures from 02/16/2021 are negative x24h. She was requiring intermittent HD, last dialysis was 02/14/2021 and HD catheter was removed on 02/14/2021. The patient also developed a purpuric rash on her extremities for which Dermatology was consulted and a biopsy was done and is pending. 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 We were concerned that vancomycin may have caused the rash and the patient was changed to daptomycin on 02/17/2021.      Recommendations:  -continue daptomycin '8mg'$ /kg q48h  -continue rifampin 300 mg BID   -continue daptomycin and rifampin for a total of 6 weeks from negative blood cultures (end date 03/29/2021)  -patient will transition to MH-7 once she is  stable, will consider CoPAT therapy  after 2-4 weeks of IV antibiotics      Please continue to follow CBC with differential, BUN, creatinine, hepatic function panel, and CRP while the patient remains on antimicrobial therapy.    Thank you for this consult. We will sign off. Please call or page the Infectious Diseases Service with any questions regarding this patient.    Georgian Co, DO PGY-4  Infectious Diseases    I saw and examined the patient.  I reviewed the fellow's note.  I agree with the findings and plan of care as documented in the note.  Any exceptions/additions are edited/noted.  Feels and looks a lot better.  LE rash is dramatic but improving.    Panda Crossin R. Gloriajean Dell MD  Pager [phone]  02/18/2021

## 2021-02-18 NOTE — Care Plan (Signed)
[facility]  Physical Therapy Progress Note      Patient Name: [name] [name]  Date of Birth: 10-12-1985  Height:  165.1 cm ('5\' 5"'$ )  Weight:  67.4 kg (148 lb 9.4 oz)  Room/Bed: 19/A  Payor: [place] MEDICAID / Plan: [employer] [place] MEDICAID / Product Type: Medicaid [place] /     Assessment:     [name] [name] tolerated tx well today. STS SBA and pt amb 127f w/o an AD and no LOB. Pt very SOB after amb and needed cues for slowing her breathing down. Continue to rec home with assist when medically stable. Will continue to follow.    Discharge Needs:   Equipment Recommendation: none anticipated    Discharge Disposition: home with assist    JUSTIFICATION OF DISCHARGE RECOMMENDATION   Based on current diagnosis, functional performance prior to admission, and current functional performance, this patient requires continued PT services in home with assist in order to achieve significant functional improvements in these deficit areas: aerobic capacity/endurance, gait, locomotion, and balance, muscle performance.      Plan:   Continue to follow patient according to established plan of care.  The risks/benefits of therapy have been discussed with the patient/caregiver and he/she is in agreement with the established plan of care.     Subjective & Objective:        02/18/21 [id number]  Therapist Pager   PT Assigned/ Pager # SRemo [phone]  Rehab Session   Document Type therapy progress note (daily note)   Total PT Minutes: 15   Patient Effort good   Symptoms Noted During/After Treatment shortness of breath   Symptoms Noted Comment Pt SOB after amb and needed cues for taking deeper breaths.   General Information   Patient Profile Reviewed yes   Medical Lines PIV Line;Telemetry   Respiratory Status room air   Existing Precautions/Restrictions fall precautions;full code   Mutuality/Individual Preferences   Individualized Care Needs OOB via A2   Pre Treatment Status   Pre Treatment Patient Status Patient sitting in  bedside chair or w/c;Call light within reach;Telephone within reach;Sitter select activated;Nurse approved session   Support Present Pre Treatment  None   Communication Pre Treatment  Nurse   Vital Signs   Pre-Treatment Heart Rate (beats/min) 97   Post-treatment Heart Rate (beats/min) 102   Pre-Treatment Resp Rate (breaths/min) 25   Post-treatment Resp Rate (breaths/min) 33   Pre Treatment BP 120/90   Post Treatment BP 120/90   Pre SpO2 (%) 90   O2 Delivery Pre Treatment room air   Post SpO2 (%) 91   O2 Delivery Post Treatment room air   Vitals Comment VSS throughout   Pain Assessment   Pre/Posttreatment Pain Comment no c/o pain   Bed Mobility Assessment/Treatment   Comment Pt began and ended tx session in chair   Transfer Assessment/Treatment   Sit-Stand Independence stand-by assistance   Stand-Sit Independence stand-by assistance   Gait Assessment/Treatment   Independence  contact guard assist   Distance in Feet 1575f  Deviations  step length decreased   Safety Issues  balance decreased during turns;step length decreased   Impairments  balance impaired;endurance;strength decreased   Balance Skill Training   Comment No AD   Sitting Balance: Static fair + balance   Sitting, Dynamic (Balance) fair + balance   Sit-to-Stand Balance fair + balance   Standing Balance: Static fair balance   Standing Balance: Dynamic fair balance   Post Treatment Status  Post Treatment Patient Status Patient sitting in bedside chair or w/c;Call light within reach;Telephone within reach;Engineer, water Comment Pt's performance   Basic Mobility Am-PAC/6Clicks Score (APPROVED PT Staff, [facility] PT/OT and [facility] Nursing ONLY)   Turning in bed without bedrails 4   Lying on back to sitting on edge of flat bed 4   Moving to and from a bed to a chair 3   Standing up from chair 4   Walk in room 3   Climbing 3-5 steps with railing 3   6  Clicks Raw Score total 21   Standardized (t-scale) score 45.55   CMS 0-100% Score 29.52   CMS Modifier CJ   Patient Mobility Goal (JHHLM) 6- Walk 10 steps or more 2X/day   Exercise/Activity Level Performed 8- Walked 250 feet or more   Physical Therapy Clinical Impression   Assessment [name] [name] tolerated tx well today. STS SBA and pt amb 161f w/o an AD and no LOB. Pt very SOB after amb and needed cues for slowing her breathing down. Continue to rec home with assist when medically stable. Will continue to follow.   Anticipated Equipment Needs at Discharge (PT) none anticipated   Anticipated Discharge Disposition home with assist       Therapist:   SOdessa Fleming PTA   Pager #: [phone]

## 2021-02-19 ENCOUNTER — Inpatient Hospital Stay (HOSPITAL_COMMUNITY): Payer: Medicaid Other

## 2021-02-19 DIAGNOSIS — F1910 Other psychoactive substance abuse, uncomplicated: Secondary | ICD-10-CM

## 2021-02-19 DIAGNOSIS — J181 Lobar pneumonia, unspecified organism: Secondary | ICD-10-CM

## 2021-02-19 DIAGNOSIS — R918 Other nonspecific abnormal finding of lung field: Secondary | ICD-10-CM

## 2021-02-19 DIAGNOSIS — Z952 Presence of prosthetic heart valve: Secondary | ICD-10-CM

## 2021-02-19 DIAGNOSIS — J9811 Atelectasis: Secondary | ICD-10-CM

## 2021-02-19 DIAGNOSIS — J90 Pleural effusion, not elsewhere classified: Secondary | ICD-10-CM

## 2021-02-19 LAB — BASIC METABOLIC PANEL
ANION GAP: 11 mmol/L (ref 4–13)
ANION GAP: 9 mmol/L (ref 4–13)
ANION GAP: 10 mmol/L (ref 4–13)
ANION GAP: 11 mmol/L (ref 4–13)
ANION GAP: 11 mmol/L (ref 4–13)
BUN/CREA RATIO: 19 (ref 6–22)
BUN/CREA RATIO: 22 (ref 6–22)
BUN/CREA RATIO: 21 (ref 6–22)
BUN/CREA RATIO: 24 — ABNORMAL HIGH (ref 6–22)
BUN/CREA RATIO: 27 — ABNORMAL HIGH (ref 6–22)
BUN: 53 mg/dL — ABNORMAL HIGH (ref 8–25)
BUN: 50 mg/dL — ABNORMAL HIGH (ref 8–25)
BUN: 52 mg/dL — ABNORMAL HIGH (ref 8–25)
BUN: 56 mg/dL — ABNORMAL HIGH (ref 8–25)
BUN: 58 mg/dL — ABNORMAL HIGH (ref 8–25)
CALCIUM: 7.6 mg/dL — ABNORMAL LOW (ref 8.5–10.0)
CALCIUM: 7.1 mg/dL — ABNORMAL LOW (ref 8.5–10.0)
CALCIUM: 7.6 mg/dL — ABNORMAL LOW (ref 8.5–10.0)
CALCIUM: 7.8 mg/dL — ABNORMAL LOW (ref 8.5–10.0)
CALCIUM: 7.7 mg/dL — ABNORMAL LOW (ref 8.5–10.0)
CHLORIDE: 92 mmol/L — ABNORMAL LOW (ref 96–111)
CHLORIDE: 94 mmol/L — ABNORMAL LOW (ref 96–111)
CHLORIDE: 91 mmol/L — ABNORMAL LOW (ref 96–111)
CHLORIDE: 95 mmol/L — ABNORMAL LOW (ref 96–111)
CHLORIDE: 95 mmol/L — ABNORMAL LOW (ref 96–111)
CO2 TOTAL: 17 mmol/L — ABNORMAL LOW (ref 22–30)
CO2 TOTAL: 17 mmol/L — ABNORMAL LOW (ref 22–30)
CO2 TOTAL: 18 mmol/L — ABNORMAL LOW (ref 22–30)
CO2 TOTAL: 17 mmol/L — ABNORMAL LOW (ref 22–30)
CO2 TOTAL: 18 mmol/L — ABNORMAL LOW (ref 22–30)
CREATININE: 2.73 mg/dL — ABNORMAL HIGH (ref 0.60–1.05)
CREATININE: 2.25 mg/dL — ABNORMAL HIGH (ref 0.60–1.05)
CREATININE: 2.44 mg/dL — ABNORMAL HIGH (ref 0.60–1.05)
CREATININE: 2.31 mg/dL — ABNORMAL HIGH (ref 0.60–1.05)
CREATININE: 2.15 mg/dL — ABNORMAL HIGH (ref 0.60–1.05)
ESTIMATED GFR: 23 mL/min/BSA — ABNORMAL LOW (ref >=60)
ESTIMATED GFR: 28 mL/min/BSA — ABNORMAL LOW (ref >=60)
ESTIMATED GFR: 26 mL/min/BSA — ABNORMAL LOW (ref >=60)
ESTIMATED GFR: 28 mL/min/BSA — ABNORMAL LOW (ref >=60)
ESTIMATED GFR: 30 mL/min/BSA — ABNORMAL LOW (ref >=60)
GLUCOSE: 101 mg/dL (ref 65–125)
GLUCOSE: 89 mg/dL (ref 65–125)
GLUCOSE: 103 mg/dL (ref 65–125)
GLUCOSE: 91 mg/dL (ref 65–125)
GLUCOSE: 95 mg/dL (ref 65–125)
POTASSIUM: 4.1 mmol/L (ref 3.5–5.1)
POTASSIUM: 3.9 mmol/L (ref 3.5–5.1)
POTASSIUM: 4.1 mmol/L (ref 3.5–5.1)
POTASSIUM: 4.8 mmol/L (ref 3.5–5.1)
POTASSIUM: 4.4 mmol/L (ref 3.5–5.1)
SODIUM: 120 mmol/L — CL (ref 136–145)
SODIUM: 120 mmol/L — CL (ref 136–145)
SODIUM: 119 mmol/L — CL (ref 136–145)
SODIUM: 123 mmol/L — ABNORMAL LOW (ref 136–145)
SODIUM: 124 mmol/L — ABNORMAL LOW (ref 136–145)

## 2021-02-19 LAB — HEPATIC FUNCTION PANEL
ALBUMIN: 2 g/dL — ABNORMAL LOW (ref 3.5–5.0)
ALKALINE PHOSPHATASE: 71 U/L (ref 40–110)
ALT (SGPT): 5 U/L — ABNORMAL LOW (ref 8–22)
AST (SGOT): 18 U/L (ref 8–45)
BILIRUBIN DIRECT: 0.4 mg/dL (ref 0.1–0.4)
BILIRUBIN TOTAL: 0.6 mg/dL (ref 0.3–1.3)
PROTEIN TOTAL: 6.5 g/dL (ref 6.4–8.3)

## 2021-02-19 LAB — OSMOLALITY: OSMOLALITY, BLOOD: 278 mOsm/kg — ABNORMAL LOW (ref 280–300)

## 2021-02-19 LAB — C-REACTIVE PROTEIN(CRP),INFLAMMATION: CRP INFLAMMATION: 130.2 mg/L — ABNORMAL HIGH (ref <8.0)

## 2021-02-19 LAB — CBC
HCT: 27.6 % — ABNORMAL LOW (ref 34.8–46.0)
HGB: 9.1 g/dL — ABNORMAL LOW (ref 11.5–16.0)
MCH: 26.7 pg (ref 26.0–32.0)
MCHC: 33 g/dL (ref 31.0–35.5)
MCV: 80.9 fL (ref 78.0–100.0)
MPV: 8.4 fL — ABNORMAL LOW (ref 8.7–12.5)
PLATELETS: 367 10*3/uL (ref 150–400)
RBC: 3.41 10*6/uL — ABNORMAL LOW (ref 3.85–5.22)
RDW-CV: 19.5 % — ABNORMAL HIGH (ref 11.5–15.5)
WBC: 9.9 10*3/uL (ref 3.7–11.0)

## 2021-02-19 LAB — MAGNESIUM
MAGNESIUM: 2.5 mg/dL (ref 1.8–2.6)
MAGNESIUM: 2.4 mg/dL (ref 1.8–2.6)

## 2021-02-19 LAB — OSMOLALITY, RANDOM URINE: OSMOLALITY URINE: 245 mOsm/kg (ref 50–1400)

## 2021-02-19 LAB — UREA NITROGEN, RANDOM URINE: UREA NITROGEN RANDOM URINE: 255 mg/dL — ABNORMAL LOW (ref 750–1200)

## 2021-02-19 LAB — PT/INR
INR: 2.42 — ABNORMAL HIGH (ref 0.80–1.20)
PROTHROMBIN TIME: 28.3 seconds — ABNORMAL HIGH (ref 9.1–13.9)

## 2021-02-19 LAB — PHOSPHORUS
PHOSPHORUS: 7.1 mg/dL — ABNORMAL HIGH (ref 2.4–4.7)
PHOSPHORUS: 7.2 mg/dL — ABNORMAL HIGH (ref 2.4–4.7)

## 2021-02-19 LAB — SODIUM, RANDOM URINE: SODIUM RANDOM URINE: <20 mmol/L

## 2021-02-19 MED ORDER — QUETIAPINE 50 MG TABLET
50.0000 mg | ORAL_TABLET | Freq: Every evening | ORAL | Status: DC
Start: 2021-02-19 — End: 2021-02-21
  Administered 2021-02-19 – 2021-02-20 (×2): 50 mg via ORAL
  Filled 2021-02-19 (×2): qty 1

## 2021-02-19 MED ORDER — SODIUM CHLORIDE 0.9 % INTRAVENOUS SOLUTION
120.0000 mg | Freq: Two times a day (BID) | INTRAVENOUS | Status: DC
Start: 2021-02-19 — End: 2021-02-20
  Administered 2021-02-19: 0 mg via INTRAVENOUS
  Administered 2021-02-19: 120 mg via INTRAVENOUS
  Filled 2021-02-19 (×3): qty 12

## 2021-02-19 MED ORDER — SODIUM CHLORIDE 1 GRAM TABLET
1.0000 g | ORAL_TABLET | Freq: Three times a day (TID) | ORAL | Status: DC
Start: 2021-02-19 — End: 2021-02-22
  Administered 2021-02-19 – 2021-02-21 (×7): 1 g via ORAL
  Filled 2021-02-19 (×10): qty 1

## 2021-02-19 MED ORDER — FUROSEMIDE 10 MG/ML INJECTION SOLUTION
40.0000 mg | Freq: Once | INTRAMUSCULAR | Status: AC
Start: 2021-02-19 — End: 2021-02-19
  Administered 2021-02-19: 40 mg via INTRAVENOUS
  Filled 2021-02-19: qty 4

## 2021-02-19 MED ORDER — ALBUMIN, HUMAN 25 % INTRAVENOUS SOLUTION
25.0000 g | Freq: Four times a day (QID) | INTRAVENOUS | Status: AC
Start: 2021-02-19 — End: 2021-02-20
  Administered 2021-02-19 (×3): 0 g via INTRAVENOUS
  Administered 2021-02-19 (×3): 25 g via INTRAVENOUS
  Administered 2021-02-20: 0 g via INTRAVENOUS
  Administered 2021-02-20: 25 g via INTRAVENOUS
  Filled 2021-02-19: qty 100
  Filled 2021-02-19: qty 50
  Filled 2021-02-19 (×2): qty 100
  Filled 2021-02-19: qty 50

## 2021-02-19 MED ORDER — POTASSIUM CHLORIDE ER 20 MEQ TABLET,EXTENDED RELEASE(PART/CRYST)
40.0000 meq | ORAL_TABLET | ORAL | Status: AC
Start: 2021-02-19 — End: 2021-02-19
  Administered 2021-02-19: 0 meq via ORAL
  Administered 2021-02-19: 40 meq via ORAL
  Filled 2021-02-19: qty 2

## 2021-02-19 MED ORDER — OXYCODONE 10 MG/0.5 ML ORAL SYRINGE (FOR ORAL USE ONLY)
5.0000 mg | INJECTION | Freq: Four times a day (QID) | ORAL | Status: AC | PRN
Start: 2021-02-19 — End: 2021-02-26

## 2021-02-19 MED ORDER — WARFARIN 2 MG TABLET
2.0000 mg | ORAL_TABLET | Freq: Once | ORAL | Status: AC
Start: 2021-02-19 — End: 2021-02-19
  Administered 2021-02-19: 2 mg via ORAL
  Filled 2021-02-19: qty 1

## 2021-02-19 MED ORDER — SEVELAMER CARBONATE 0.8 GRAM ORAL POWDER PACKET
1600.0000 mg | Freq: Three times a day (TID) | ORAL | Status: DC
Start: 2021-02-19 — End: 2021-02-24
  Administered 2021-02-19 – 2021-02-22 (×10): 1600 mg via ORAL
  Administered 2021-02-22: 0 mg via ORAL
  Administered 2021-02-23 – 2021-02-24 (×4): 1600 mg via ORAL
  Administered 2021-02-24: 0 mg via ORAL
  Filled 2021-02-19 (×19): qty 2

## 2021-02-19 MED ORDER — CHLOROTHIAZIDE SODIUM 500 MG INTRAVENOUS SOLUTION
500.0000 mg | Freq: Two times a day (BID) | INTRAVENOUS | Status: DC
Start: 2021-02-19 — End: 2021-02-19
  Administered 2021-02-19: 500 mg via INTRAVENOUS
  Filled 2021-02-19: qty 17.86

## 2021-02-19 MED ORDER — OXYCODONE 10 MG/0.5 ML ORAL SYRINGE (FOR ORAL USE ONLY)
10.0000 mg | INJECTION | Freq: Four times a day (QID) | ORAL | Status: AC | PRN
Start: 2021-02-19 — End: 2021-02-26
  Administered 2021-02-19 – 2021-02-26 (×24): 10 mg via ORAL
  Filled 2021-02-19 (×24): qty 0.5
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
## 2021-02-19 NOTE — Care Plan (Signed)
Care plan reviewed with patient. Plan for potential transfer to stepdown 9/6. Pain controlled with PRN PO pain medications; Oxy changed from Q4H to Q6H. Patient tolerating renal mechanical soft diet without complications with poor PO intake. Patient ambulating x SBA up to 250' with mild dyspnea upon exertion. Fall precautions maintained w/ bed/chair alarm in place/armed. Sternal precautions in place with patient education for splinting cough and proper body mechanics related to precautions. Cardiac precautions in place with progressive ambulation as tolerated and education regarding post interventions and new medications reviewed with patient. Discharge planning ongoing.     Interventions: Q4H BMP for hyponatremia, 56mq K replacement, Sodium PO replacement, Ambulated in Hall TID, Phosphate binders initiated, Diuresis with '120mg'$  IVPB lasix BID with albumin.    Plan of Care: Projected to transfer to infusion service when appropriate.    DME(s): None    Family / Patient Concerns: Keep informed      JBelva BertinHighest Level of Mobility Goal      Date: 02/19/21     JH-HLM Goal:      Exercise Level: 8- Walked 250 feet or more    Goal Outcome: Goal Achieved           Imaging, Kardex, and Labs Reviewed:  Results for orders placed or performed during the hospital encounter of 02/05/21 (from the past 24 hour(s))   C-REACTIVE PROTEIN(CRP),INFLAMMATION   Result Value Ref Range    CRP INFLAMMATION 130.2 (H) <8.0 mg/L   PT/INR daily x 7   Result Value Ref Range    PROTHROMBIN TIME 28.3 (H) 9.1 - 13.9 seconds    INR 2.42 (H) 0.80 - 1.20    Narrative    Coumadin therapy INR range for Conventional Anticoagulation is 2.0 to 3.0 and for Intensive Anticoagulation 2.5 to 3.5.   CBC - ONCE   Result Value Ref Range    WBC 9.9 3.7 - 11.0 x10^3/uL    RBC 3.41 (L) 3.85 - 5.22 x10^6/uL    HGB 9.1 (L) 11.5 - 16.0 g/dL    HCT 27.6 (L) 34.8 - 46.0 %    MCV 80.9 78.0 - 100.0 fL    MCH 26.7 26.0 - 32.0 pg    MCHC 33.0 31.0 - 35.5 g/dL    RDW-CV  19.5 (H) 11.5 - 15.5 %    PLATELETS 367 150 - 400 x10^3/uL    MPV 8.4 (L) 8.7 - 12.5 fL   BASIC METABOLIC PANEL   Result Value Ref Range    SODIUM 120 (LL) 136 - 145 mmol/L    POTASSIUM 4.1 3.5 - 5.1 mmol/L    CHLORIDE 92 (L) 96 - 111 mmol/L    CO2 TOTAL 17 (L) 22 - 30 mmol/L    ANION GAP 11 4 - 13 mmol/L    CALCIUM 7.6 (L) 8.5 - 10.0 mg/dL    GLUCOSE 101 65 - 125 mg/dL    BUN 53 (H) 8 - 25 mg/dL    CREATININE 2.73 (H) 0.60 - 1.05 mg/dL    BUN/CREA RATIO 19 6 - 22    ESTIMATED GFR 23 (L) >=60 mL/min/BSA   PHOSPHORUS   Result Value Ref Range    PHOSPHORUS 7.1 (H) 2.4 - 4.7 mg/dL   MAGNESIUM   Result Value Ref Range    MAGNESIUM 2.5 1.8 - 2.6 mg/dL   OSMOLALITY   Result Value Ref Range    OSMOLALITY, BLOOD 278 (L) 280 - 300 mOsm/kg   OSMOLALITY, RANDOM URINE   Result Value  Ref Range    OSMOLALITY URINE 245 50 - 1,400 mOsm/kg   UREA NITROGEN, RANDOM URINE   Result Value Ref Range    UREA NITROGEN RANDOM URINE 255 (L) 750 - 1,200 mg/dL   SODIUM, RANDOM URINE   Result Value Ref Range    SODIUM RANDOM URINE <20 No reference intervals are established. mmol/L   BASIC METABOLIC PANEL - ONCE   Result Value Ref Range    SODIUM 120 (LL) 136 - 145 mmol/L    POTASSIUM 3.9 3.5 - 5.1 mmol/L    CHLORIDE 94 (L) 96 - 111 mmol/L    CO2 TOTAL 17 (L) 22 - 30 mmol/L    ANION GAP 9 4 - 13 mmol/L    CALCIUM 7.1 (L) 8.5 - 10.0 mg/dL    GLUCOSE 89 65 - 125 mg/dL    BUN 50 (H) 8 - 25 mg/dL    CREATININE 2.25 (H) 0.60 - 1.05 mg/dL    BUN/CREA RATIO 22 6 - 22    ESTIMATED GFR 28 (L) >=60 mL/min/BSA   BASIC METABOLIC PANEL - ONCE   Result Value Ref Range    SODIUM 119 (LL) 136 - 145 mmol/L    POTASSIUM 4.1 3.5 - 5.1 mmol/L    CHLORIDE 91 (L) 96 - 111 mmol/L    CO2 TOTAL 18 (L) 22 - 30 mmol/L    ANION GAP 10 4 - 13 mmol/L    CALCIUM 7.6 (L) 8.5 - 10.0 mg/dL    GLUCOSE 103 65 - 125 mg/dL    BUN 52 (H) 8 - 25 mg/dL    CREATININE 2.44 (H) 0.60 - 1.05 mg/dL    BUN/CREA RATIO 21 6 - 22    ESTIMATED GFR 26 (L) >=60 mL/min/BSA   BASIC METABOLIC PANEL -  ONCE   Result Value Ref Range    SODIUM 123 (L) 136 - 145 mmol/L    POTASSIUM 4.8 3.5 - 5.1 mmol/L    CHLORIDE 95 (L) 96 - 111 mmol/L    CO2 TOTAL 17 (L) 22 - 30 mmol/L    ANION GAP 11 4 - 13 mmol/L    CALCIUM 7.8 (L) 8.5 - 10.0 mg/dL    GLUCOSE 91 65 - 125 mg/dL    BUN 56 (H) 8 - 25 mg/dL    CREATININE 2.31 (H) 0.60 - 1.05 mg/dL    BUN/CREA RATIO 24 (H) 6 - 22    ESTIMATED GFR 28 (L) >=60 mL/min/BSA    Narrative    Hemolysis can alter results at this level (slight).   MAGNESIUM - ONCE   Result Value Ref Range    MAGNESIUM 2.4 1.8 - 2.6 mg/dL   PHOSPHORUS - ONCE   Result Value Ref Range    PHOSPHORUS 7.2 (H) 2.4 - 4.7 mg/dL     CT CHEST ABDOMEN PELVIS WO IV CONTRAST    Result Date: 02/05/2021  Impression 1. Bilateral airspace opacities, some of which are cavitary likely representing septic emboli. More confluent airspace opacity is noted dependently at both lung bases and may represent a component of aspiration pneumonia. 2. Hepatosplenomegaly. 3. Trace ascites in the pelvis and mild anasarca.     XR ABD SUPINE    Result Date: 02/16/2021  Impression Persistent mild ileus type bowel gas pattern, stable from the prior study.    XR ABD SUPINE    Result Date: 02/15/2021  Impression Moderate gaseous distention multiple loops of small bowel in the central abdomen. Bowel gas pattern consistent with ileus.  Close attention to follow-up is recommended.    CT BRAIN WO IV CONTRAST    Result Date: 02/05/2021  Impression No evidence of acute intracranial process.    MRI BRAIN WO CONTRAST    Result Date: 02/07/2021  Impression 1.No acute intracranial abnormality. 2.Bilateral mastoid effusions.    MRI SPINE CERVICAL WO CONTRAST    Result Date: 02/10/2021  Impression 1.Within the confines of an unenhanced study, there is no evidence of discitis-osteomyelitis or fluid collections in the cervical, thoracic and lumbar spine. 2.No cord signal abnormality or cord compression. 3.No spinal canal or neural foraminal stenosis. 4.Incidental low-lying  conus at L3-L4. No associated fibrofatty lesion of the filum terminale, dysraphism of the imaged spine or myelomeningocele. No convincing dorsal dermal sinus tract. 5.Pulmonary septic-embolic disease is better assessed on the prior CT chest from 02/05/2021.    MRI SPINE THORACIC WO CONTRAST    Result Date: 02/10/2021  Impression 1.Within the confines of an unenhanced study, there is no evidence of discitis-osteomyelitis or fluid collections in the cervical, thoracic and lumbar spine. 2.No cord signal abnormality or cord compression. 3.No spinal canal or neural foraminal stenosis. 4.Incidental low-lying conus at L3-L4. No associated fibrofatty lesion of the filum terminale, dysraphism of the imaged spine or myelomeningocele. No convincing dorsal dermal sinus tract. 5.Pulmonary septic-embolic disease is better assessed on the prior CT chest from 02/05/2021.    MRI SPINE LUMBOSACRAL WO CONTRAST    Result Date: 02/10/2021  Impression 1.Within the confines of an unenhanced study, there is no evidence of discitis-osteomyelitis or fluid collections in the cervical, thoracic and lumbar spine. 2.No cord signal abnormality or cord compression. 3.No spinal canal or neural foraminal stenosis. 4.Incidental low-lying conus at L3-L4. No associated fibrofatty lesion of the filum terminale, dysraphism of the imaged spine or myelomeningocele. No convincing dorsal dermal sinus tract. 5.Pulmonary septic-embolic disease is better assessed on the prior CT chest from 02/05/2021.    XR ABD X-RAY CHECK DOBHOFF PLACEMENT    Result Date: 02/06/2021  Impression 1.Appropriately positioned enteric tube with tip in the gastric body. 2.Interval retraction of esophageal temperature probe with tip at the level of the GE junction.    XR ABD X-RAY CHECK DOBHOFF PLACEMENT    Result Date: 02/06/2021  Impression Enteric tube across the diaphragm with tip overlying the distal body of the stomach. Side-port is 9 cm caudal to the diaphragmatic hiatus. The  esophageal temperature probe also crosses the diaphragm with tip projecting over the upper sacrum. Subsequent radiograph demonstrates significant retraction of the esophageal temperature probe.    XR AP MOBILE CHEST    Result Date: 02/19/2021  Impression Small right pleural effusion and airspace opacities, unchanged.    XR AP MOBILE CHEST    Result Date: 02/18/2021  Impression Unchanged bilateral pleural effusions and airspace opacities compatible with known septic pulmonary emboli.    XR AP MOBILE CHEST    Result Date: 02/17/2021  Impression Unchanged bilateral pleural effusions with associated atelectasis/consolidation.     XR AP MOBILE CHEST    Result Date: 02/16/2021  Impression Stable bilateral lower lung opacities.    XR AP MOBILE CHEST    Result Date: 02/15/2021  Impression Stable lung aeration.     XR AP MOBILE CHEST    Result Date: 02/14/2021  Impression 1.Persistent patchy nodular areas of consolidation bilaterally with small bilateral pleural effusions and bibasilar atelectasis/consolidation, slightly more prominent than on the prior examination. 2.Stable cardiac enlargement and mild central vascular congestion.    XR CHEST AP  MOBILE    Result Date: 02/13/2021  Impression Tiny right pleural effusion. Postoperative changes.Interval median sternotomy and placement of nasogastric tube, endotracheal tube and replaced right central line.     XR AP MOBILE CHEST    Result Date: 02/11/2021  Impression Stable lung aeration with bilateral patchy and cavitary opacities.    XR AP MOBILE CHEST    Result Date: 02/10/2021  Impression Stable lung aeration with cavitary appearance as over the right upper and lower lung zones, suspect septic emboli.     XR AP MOBILE CHEST    Result Date: 02/09/2021  Impression 1.Persistent subtle patchy bilateral airspace opacities with nonspecific increased hazy opacities over the right mid/lower lung zone. 2.Question of new small cavitary lesion within the right upper lung zone. See image annotation.  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 CT evaluation should be considered for further characterization.    XR AP MOBILE CHEST    Result Date: 02/08/2021  Impression 1.Subtle patchy bilateral airspace opacities are unchanged. No new cardiopulmonary process. 2.Esophageal probe tip projects over the cervical esophagus. Repositioning is recommended. Other support devices as above.    XR AP MOBILE CHEST    Result Date: 02/07/2021  Impression Minor air space opacities unchanged in position. No lobar consolidation or pleural effusions.    XR AP MOBILE CHEST    Result Date: 02/06/2021  Impression There are stable support devices with esophageal temperature probe tip at the level of the gastroesophageal junction. Slight retraction is recommended. Mixed interstitial and airspace opacities and cavitary opacities are redemonstrated. No visible pneumothorax.    XR AP MOBILE CHEST    Result Date: 02/06/2021  Impression Interval placement of the right IJ central venous catheter, tip projecting over right atrium. No pneumothorax.    XR AP MOBILE CHEST    Result Date: 02/05/2021  Impression 1.Few nodular opacities with possible cavitation, concerning for infectious/inflammatory process. 2.Endotracheal tube tip projects about 2 cm from the carina.    CT FACIAL BONES WO IV CONTRAST    Result Date: 02/05/2021  Impression Multiple mandibular dental caries with periapical lucencies surrounding the root fragments of the first premolars. Dental follow-up recommended.     Education Documentation  sevelamer carbonate, taught by Deatra James, RN at 02/19/2021  6:47 PM.  Learner: Family, Patient  Readiness: Acceptance  Method: Explanation  Response: Needs Reinforcement  Comment: Family educated during update. Patient need reinforcement related to delirium.    albumin human, taught by Deatra James, RN at 02/19/2021  6:47 PM.  Learner: Family, Patient  Readiness: Acceptance  Method:  Explanation  Response: Needs Reinforcement  Comment: Family educated during update. Patient need reinforcement related to delirium.    chlorothiazide sodium, taught by Deatra James, RN at 02/19/2021  6:47 PM.  Learner: Family, Patient  Readiness: Acceptance  Method: Explanation  Response: Needs Reinforcement  Comment: Family educated during update. Patient need reinforcement related to delirium.    ascorbic acid, taught by Deatra James, RN at 02/19/2021  6:47 PM.  Learner: Family, Patient  Readiness: Acceptance  Method: Explanation  Response: Needs Reinforcement  Comment: Family educated during update. Patient need reinforcement related to delirium.    furosemide, taught by Deatra James, RN at 02/19/2021  6:47 PM.  Learner: Family, Patient  Readiness: Acceptance  Method: Explanation  Response: Needs Reinforcement  Comment: Family educated during update. Patient need reinforcement related to delirium.    vancomycin HCl, taught by Deatra James, RN at 02/19/2021  6:47 PM.  Learner: Family, Patient  Readiness: Acceptance  Method: Explanation  Response: Needs Reinforcement  Comment: Family educated during update. Patient need reinforcement related to delirium.    potassium chloride, taught by Deatra James, RN at 02/19/2021  6:47 PM.  Learner: Family, Patient  Readiness: Acceptance  Method: Explanation  Response: Needs Reinforcement  Comment: Family educated during update. Patient need reinforcement related to delirium.    magnesium sulfate in water, taught by Deatra James, RN at 02/19/2021  6:47 PM.  Learner: Family, Patient  Readiness: Acceptance  Method: Explanation  Response: Needs Reinforcement  Comment: Family educated during update. Patient need reinforcement related to delirium.    0.9 % sodium chloride, taught by Deatra James, RN at 02/19/2021  6:47 PM.  Learner: Family, Patient  Readiness: Acceptance  Method: Explanation  Response: Needs Reinforcement  Comment: Family educated during  update. Patient need reinforcement related to delirium.    calcium chloride, taught by Deatra James, RN at 02/19/2021  6:47 PM.  Learner: Family, Patient  Readiness: Acceptance  Method: Explanation  Response: Needs Reinforcement  Comment: Family educated during update. Patient need reinforcement related to delirium.    miconazole nitrate, taught by Deatra James, RN at 02/19/2021  6:47 PM.  Learner: Family, Patient  Readiness: Acceptance  Method: Explanation  Response: Needs Reinforcement  Comment: Family educated during update. Patient need reinforcement related to delirium.    famotidine, taught by Deatra James, RN at 02/19/2021  6:47 PM.  Learner: Family, Patient  Readiness: Acceptance  Method: Explanation  Response: Needs Reinforcement  Comment: Family educated during update. Patient need reinforcement related to delirium.    chlorhexidine gluconate, taught by Deatra James, RN at 02/19/2021  6:47 PM.  Learner: Family, Patient  Readiness: Acceptance  Method: Explanation  Response: Needs Reinforcement  Comment: Family educated during update. Patient need reinforcement related to delirium.    Pain Unresolved/Worsening, taught by Deatra James, RN at 02/19/2021  6:47 PM.  Learner: Family, Patient  Readiness: Acceptance  Method: Explanation  Response: Needs Reinforcement  Comment: Family educated during update. Patient need reinforcement related to delirium.    Infection Signs/Symptoms, taught by Deatra James, RN at 02/19/2021  6:47 PM.  Learner: Family, Patient  Readiness: Acceptance  Method: Explanation  Response: Needs Reinforcement  Comment: Family educated during update. Patient need reinforcement related to delirium.    VTE Prevention, taught by Deatra James, RN at 02/19/2021  6:47 PM.  Learner: Family, Patient  Readiness: Acceptance  Method: Explanation  Response: Needs Reinforcement  Comment: Family educated during update. Patient need reinforcement related to delirium.    Pain  Management, taught by Deatra James, RN at 02/19/2021  6:47 PM.  Learner: Family, Patient  Readiness: Acceptance  Method: Explanation  Response: Needs Reinforcement  Comment: Family educated during update. Patient need reinforcement related to delirium.    Dressing/Incision Care, taught by Deatra James, RN at 02/19/2021  6:47 PM.  Learner: Family, Patient  Readiness: Acceptance  Method: Explanation  Response: Needs Reinforcement  Comment: Family educated during update. Patient need reinforcement related to delirium.    Activity, taught by Deatra James, RN at 02/19/2021  6:47 PM.  Learner: Family, Patient  Readiness: Acceptance  Method: Explanation  Response: Needs Reinforcement  Comment: Family educated during update. Patient need reinforcement related to delirium.    Indications, taught by Deatra James, RN at 02/19/2021  6:47 PM.  Learner: Family, Patient  Readiness: Acceptance  Method: Explanation  Response: Needs Reinforcement  Comment: Family educated during update. Patient need reinforcement related  to delirium.    Procedure Review, taught by Deatra James, RN at 02/19/2021  6:47 PM.  Learner: Family, Patient  Readiness: Acceptance  Method: Explanation  Response: Needs Reinforcement  Comment: Family educated during update. Patient need reinforcement related to delirium.    triamcinolone acetonide, taught by Deatra James, RN at 02/18/2021  3:34 PM.  Learner: Patient  Readiness: Acceptance  Method: Explanation  Response: Needs Reinforcement  Comment: Patient amenable to education but forgetful.    rifampin, taught by Deatra James, RN at 02/18/2021  3:34 PM.  Learner: Patient  Readiness: Acceptance  Method: Explanation  Response: Needs Reinforcement  Comment: Patient amenable to education but forgetful.    metoprolol tartrate, taught by Deatra James, RN at 02/18/2021  3:34 PM.  Learner: Patient  Readiness: Acceptance  Method: Explanation  Response: Needs Reinforcement  Comment:  Patient amenable to education but forgetful.    sennosides/docusate sodium, taught by Deatra James, RN at 02/18/2021  3:34 PM.  Learner: Patient  Readiness: Acceptance  Method: Explanation  Response: Needs Reinforcement  Comment: Patient amenable to education but forgetful.    lidocaine/menthol, taught by Deatra James, RN at 02/18/2021  3:34 PM.  Learner: Patient  Readiness: Acceptance  Method: Explanation  Response: Needs Reinforcement  Comment: Patient amenable to education but forgetful.    magnesium hydroxide, taught by Deatra James, RN at 02/18/2021  3:34 PM.  Learner: Patient  Readiness: Acceptance  Method: Explanation  Response: Needs Reinforcement  Comment: Patient amenable to education but forgetful.    mupirocin, taught by Deatra James, RN at 02/18/2021  3:34 PM.  Learner: Patient  Readiness: Acceptance  Method: Explanation  Response: Needs Reinforcement  Comment: Patient amenable to education but forgetful.    aspirin, taught by Deatra James, RN at 02/18/2021  3:34 PM.  Learner: Patient  Readiness: Acceptance  Method: Explanation  Response: Needs Reinforcement  Comment: Patient amenable to education but forgetful.    polyethylene glycol 3350, taught by Deatra James, RN at 02/18/2021  3:34 PM.  Learner: Patient  Readiness: Acceptance  Method: Explanation  Response: Needs Reinforcement  Comment: Patient amenable to education but forgetful.    senna leaf extract, taught by Deatra James, RN at 02/18/2021  3:34 PM.  Learner: Patient  Readiness: Acceptance  Method: Explanation  Response: Needs Reinforcement  Comment: Patient amenable to education but forgetful.    docusate sodium, taught by Deatra James, RN at 02/18/2021  3:34 PM.  Learner: Patient  Readiness: Acceptance  Method: Explanation  Response: Needs Reinforcement  Comment: Patient amenable to education but forgetful.    Education Comments  No comments found.        Problem: Adult Inpatient Plan of Care  Goal: Plan of  Care Review  Outcome: Ongoing (see interventions/notes)  Flowsheets (Taken 02/19/2021 0800)  Plan of Care Reviewed With: patient  Goal: Patient-Specific Goal (Individualized)  Outcome: Ongoing (see interventions/notes)  Flowsheets (Taken 02/19/2021 0800)  Individualized Care Needs:   OOB x SBA   limit ice due to hyponatremia  Anxieties, Fears or Concerns: Wants to use and iPad to facetime her son  Patient-Specific Goals (Include Timeframe): Ambulate QID & pulmonary toileting  Note: Progressing toward goals. iPad & smartphone given to patient to help with anxiety.  Goal: Absence of Hospital-Acquired Illness or Injury  Outcome: Ongoing (see interventions/notes)  Intervention: Identify and Manage Fall Risk  Recent Flowsheet Documentation  Taken 02/19/2021 1900 by Deatra James, RN  Safety Promotion/Fall Prevention:   motion sensor pad activated   safety round/check  completed  Taken 02/19/2021 1800 by Deatra James, RN  Safety Promotion/Fall Prevention:   motion sensor pad activated   safety round/check completed  Taken 02/19/2021 1700 by Deatra James, RN  Safety Promotion/Fall Prevention:   motion sensor pad activated   safety round/check completed  Taken 02/19/2021 1600 by Deatra James, RN  Safety Promotion/Fall Prevention:   motion sensor pad activated   safety round/check completed  Taken 02/19/2021 1500 by Deatra James, RN  Safety Promotion/Fall Prevention:   motion sensor pad activated   safety round/check completed  Taken 02/19/2021 1400 by Deatra James, RN  Safety Promotion/Fall Prevention:   motion sensor pad activated   safety round/check completed  Taken 02/19/2021 1300 by Deatra James, RN  Safety Promotion/Fall Prevention:   motion sensor pad activated   safety round/check completed  Taken 02/19/2021 1200 by Deatra James, RN  Safety Promotion/Fall Prevention:   motion sensor pad activated   safety round/check completed  Taken 02/19/2021 1100 by Deatra James, RN  Safety  Promotion/Fall Prevention:   motion sensor pad activated   safety round/check completed  Taken 02/19/2021 1000 by Deatra James, RN  Safety Promotion/Fall Prevention:   motion sensor pad activated   safety round/check completed  Taken 02/19/2021 0800 by Deatra James, RN  Safety Promotion/Fall Prevention:   activity supervised   fall prevention program maintained   muscle strengthening facilitated   motion sensor pad activated   nonskid shoes/slippers when out of bed   safety round/check completed   elopement precautions initiated  Intervention: Prevent Skin Injury  Recent Flowsheet Documentation  Taken 02/19/2021 1800 by Deatra James, RN  Body Position:   side lying, right   heels elevated off mattress  Taken 02/19/2021 1600 by Deatra James, RN  Body Position:   side lying, left   heels elevated off mattress  Taken 02/19/2021 1400 by Deatra James, RN  Body Position:   sitting   legs elevated  Taken 02/19/2021 1200 by Deatra James, RN  Body Position:   legs elevated   sitting  Taken 02/19/2021 1000 by Deatra James, RN  Body Position:   sitting   legs elevated  Taken 02/19/2021 0800 by Deatra James, RN  Body Position:   sitting   legs elevated  Intervention: Prevent and Manage VTE (Venous Thromboembolism) Risk  Recent Flowsheet Documentation  Taken 02/19/2021 0800 by Deatra James, RN  VTE Prevention/Management:   ambulation promoted   anticoagulant therapy maintained   dorsiflexion/plantar flexion performed  Goal: Optimal Comfort and Wellbeing  Outcome: Ongoing (see interventions/notes)  Intervention: Provide Person-Centered Care  Recent Flowsheet Documentation  Taken 02/19/2021 1600 by Deatra James, [place] Relationship/Rapport:   care explained   choices provided   emotional support provided   empathic listening provided   questions answered   questions encouraged   reassurance provided   thoughts/feelings acknowledged  Taken 02/19/2021 1200 by Deatra James, [place]  Relationship/Rapport:   care explained   choices provided   emotional support provided   empathic listening provided   questions answered   questions encouraged   reassurance provided   thoughts/feelings acknowledged  Taken 02/19/2021 0800 by Deatra James, [place] Relationship/Rapport:   care explained   choices provided   emotional support provided   empathic listening provided   reassurance provided   questions answered   questions encouraged   thoughts/feelings acknowledged  Goal: Rounds/Family Conference  Outcome: Ongoing (see interventions/notes)     Problem: Skin Injury Risk Increased  Goal: Skin  Health and Integrity  Outcome: Ongoing (see interventions/notes)  Intervention: Optimize Skin Protection  Recent Flowsheet Documentation  Taken 02/19/2021 1800 by Deatra James, RN  Head of Bed [facility]) Positioning: 30 degrees  Taken 02/19/2021 1600 by Deatra James, RN  Head of Bed [facility]) Positioning: 30 degrees  Taken 02/19/2021 0800 by Deatra James, RN  Pressure Reduction Techniques:   (L,M,H,VH)Frequent Turning   (L,M,H,VH) Manage Moisture, Nutrition & Shear   (L,M,H,VH) Maximal Remobilization   (H,VH) Increase Frequency of Turning   (H,VH) Supplemented with Small Shifts   frequent weight shift encouraged   heels elevated off bed   rest period provided between sit times   weight shift assistance provided  Pressure Reduction Devices:   (L.M,H,VH) Heel offloading device utilized   (L.M,H,VH) Pressure redistributing mattress utilized   (L.M,H,VH) Specialty Bed utilized   (L.M,H,VH) Pressure Redistributing Chair cushion utilized   (M,H,VH) Use Repositioning Devices or Pillows   feet on footrest/footstool   heels elevated off bed  Skin Protection:   adhesive use limited   antimicrobial wipes   electrode sites changed   pulse oximeter probe site changed   preventative decubiti skin protection foam dressing applied/intact   transparent dressing maintained   tubing/devices free from skin contact     Problem:  Ventilator-Induced Lung Injury (Mechanical Ventilation, Invasive)  Goal: Absence of Ventilator-Induced Lung Injury  Intervention: Prevent Ventilator-Associated Pneumonia  Recent Flowsheet Documentation  Taken 02/19/2021 1800 by Deatra James, RN  Head of Bed [facility]) Positioning: 30 degrees  Taken 02/19/2021 1600 by Deatra James, RN  Head of Bed [facility]) Positioning: 30 degrees  Taken 02/19/2021 0800 by Deatra James, RN  Oral Care: oral rinse provided     Problem: Infection Progression (Sepsis)  Goal: Absence of Infection Signs and Symptoms  Outcome: Ongoing (see interventions/notes)  Intervention: Initiate Sepsis Management  Recent Flowsheet Documentation  Taken 02/19/2021 0800 by Deatra James, RN  Isolation Precautions: contact precautions maintained  Intervention: Promote Stabilization  Recent Flowsheet Documentation  Taken 02/19/2021 0800 by Deatra James, RN  Fever Reduction/Comfort Measures:   lightweight bedding   lightweight clothing  Intervention: Promote Recovery  Recent Flowsheet Documentation  Taken 02/19/2021 1800 by Deatra James, RN  Activity Management:   activity encouraged   activity clustered for rest periods   activity adjusted per tolerance  Taken 02/19/2021 1700 by Deatra James, RN  Activity Management:   ambulated in hall   ambulated to bathroom   returned to bed  Taken 02/19/2021 1600 by Deatra James, RN  Activity Management: returned to bed  Taken 02/19/2021 1400 by Deatra James, RN  Activity Management: up in chair  Taken 02/19/2021 1200 by Deatra James, RN  Activity Management:   ambulated in hall   up in chair   ambulated to bathroom  Taken 02/19/2021 1000 by Deatra James, RN  Activity Management: up in chair  Taken 02/19/2021 0800 by Deatra James, RN  Activity Management:   ambulated in hall   ambulated to bathroom   up in chair     Problem: Fall Injury Risk  Goal: Absence of Fall and Fall-Related Injury  Outcome: Ongoing (see  interventions/notes)  Intervention: Promote Injury-Free Environment  Recent Flowsheet Documentation  Taken 02/19/2021 1900 by Deatra James, RN  Safety Promotion/Fall Prevention:   motion sensor pad activated   safety round/check completed  Taken 02/19/2021 1800 by Deatra James, RN  Safety Promotion/Fall Prevention:   motion sensor pad activated   safety round/check completed  Taken 02/19/2021 1700 by Deatra James,  RN  Safety Promotion/Fall Prevention:   motion sensor pad activated   safety round/check completed  Taken 02/19/2021 1600 by Deatra James, RN  Safety Promotion/Fall Prevention:   motion sensor pad activated   safety round/check completed  Taken 02/19/2021 1500 by Deatra James, RN  Safety Promotion/Fall Prevention:   motion sensor pad activated   safety round/check completed  Taken 02/19/2021 1400 by Deatra James, RN  Safety Promotion/Fall Prevention:   motion sensor pad activated   safety round/check completed  Taken 02/19/2021 1300 by Deatra James, RN  Safety Promotion/Fall Prevention:   motion sensor pad activated   safety round/check completed  Taken 02/19/2021 1200 by Deatra James, RN  Safety Promotion/Fall Prevention:   motion sensor pad activated   safety round/check completed  Taken 02/19/2021 1100 by Deatra James, RN  Safety Promotion/Fall Prevention:   motion sensor pad activated   safety round/check completed  Taken 02/19/2021 1000 by Deatra James, RN  Safety Promotion/Fall Prevention:   motion sensor pad activated   safety round/check completed  Taken 02/19/2021 0800 by Deatra James, RN  Safety Promotion/Fall Prevention:   activity supervised   fall prevention program maintained   muscle strengthening facilitated   motion sensor pad activated   nonskid shoes/slippers when out of bed   safety round/check completed   elopement precautions initiated     Problem: Acute Rehab Services Goal & Intervention Plan  Goal: Gait Training Goal  Description: Stand  Alone Therapy Goal  Outcome: Ongoing (see interventions/notes)  Goal: Interprofessional Goal  Description: Stand Alone Therapy Goal  Outcome: Ongoing (see interventions/notes)  Goal: Occupational Therapy Goals  Description: Stand Alone Therapy Goal  Outcome: Ongoing (see interventions/notes)  Goal: Physical Therapy Goal  Description: Stand Alone Therapy Goal  Outcome: Ongoing (see interventions/notes)  Goal: Toileting Goal  Description: Stand Alone Therapy Goal  Outcome: Outcome Achieved     Problem: Pain Acute  Goal: Acceptable Pain Control and Functional Ability  Outcome: Ongoing (see interventions/notes)  Intervention: Optimize Psychosocial Wellbeing  Recent Flowsheet Documentation  Taken 02/19/2021 1600 by Deatra James, RN  Diversional Activities: television  Taken 02/19/2021 1200 by Deatra James, RN  Diversional Activities: television  Taken 02/19/2021 0800 by Deatra James, RN  Diversional Activities:   television   coloring/artwork  Supportive Measures:   active listening utilized   goal-setting facilitated     Problem: Adjustment to Surgery (Cardiovascular Surgery)  Goal: Optimal Coping with Heart Surgery  Outcome: Outcome Achieved  Intervention: Support Psychosocial Response to Surgery  Recent Flowsheet Documentation  Taken 02/19/2021 1600 by Deatra James, RN  Family/Support System Care: presence promoted  Taken 02/19/2021 1200 by Deatra James, [place]: presence promoted  Taken 02/19/2021 0800 by Deatra James, RN  Family/Support System Care:   caregiver stress acknowledged   self-care encouraged   support provided  Supportive Measures:   active listening utilized   goal-setting facilitated     Problem: Pain (Cardiovascular Surgery)  Goal: Acceptable Pain Control  Outcome: Ongoing (see interventions/notes)

## 2021-02-19 NOTE — Nurses Notes (Signed)
Patient reassessment completed. No change from previous assessment, at this time. Patient resting comfortably. Will continue to monitor.

## 2021-02-19 NOTE — Progress Notes (Addendum)
[facility]  HVI Critical Care Consult/Progress Note     Verne, Cove, 35 y.o. female  Date of Birth: 29-Jan-1986  Medical Record Number:  [id number]   Inpatient Admission Date: 02/05/2021   Hospital Day:  LOS: 0 days      Chief Complaint: Altered Mental Status   History of Present Illness: Per previous providers: "Pt is a 35 yo female with a significant PMH for Smoking and IVDU presented to [name] with AMS on 01/31/21. She was found to have a drug overdose, aspiration PNA requiring intubation, MRSA bacteremia, large TV endocarditis with moderate TR and elevated systemic pulmonary pressures (50mhg), AKI requiring HD, anemia requiring PRBC and thrombocytopenia. Urine drug screen + for heroin and methamphetamines.     [facility]."   POD #/ Procedure: [facility]/22: [facility]  8/30: On pump beating heart TVR (Epic 33)  PFO colsure   Hospital Course/Major events:  8/22: See above.   -Prop for sedation. Agitated; MAE; does not follow. Repeat CT Head/CAP. Hemodynamics stable. Intubated. Repeated blood cultures. Continue Vanc.   8/23: had pan CT 8/22, still not awakening/folowing commands on precedex  But agitated. MRI brain ordered, started on CRRT  8/24: remains on CRRT, MRI Brain no abnormality, following commands on dexmedetomidine, quetiapine, prn dilaudid  8/25 brocnh today for increased ETT secretions,plan for OR early next week, OMFS following for dental root extriction.  Pt self-extubated this afternoon.  Doing well on NC.  8/26: Intermittent delirium. Weak. Continue Seroquel.  Hemodynamics stable. NC. Aggressive pulm toilet. Continue nebs. Speech to see. Dental procedure today. Continues + BC. ID following; MRI CTLS ordered. Continue antibiotics. Surgical plan TBA.   8/27:  MRI completed today. Intermittent dex. Surgical plan TBA. HD today per nephology. Bedside speech and swallow. Blood  repeat Cx tomorrow.   8/28 repeat TTE today for OR later this week, HD holiday will give 200 mg Lasix for diuresis (followed by diuril 5088mif no response w/lasix) and D/C [place] for line holiday  8/29: OR tomorrow. Agitation. Seroquel. Hemodynamics stable. HD today prior to surgery. New line placed 2/2 line holiday. 2 u PRBC pre op optimization.   8/30: OR for TVR, PFO closure, out of OR intubated, levophed infusing, normal EF  8/31: OBT, deline, continue CT, HD today, repeat BC  9/1: hypertensive; started lopressor 6.2530mID, repeat BC, lasix 80 IV BID started, holding HD, repeat BC   9/2: Decrease Seroquel, increase BB, d/c pacer wires and groin bumper, lasix 200, Diuril 500, place trialysis line, give 1st dose coumadin tonight, repeat BC [facility]9/3: switched to daptomycin from vancomycin due to rash.   9/4: continues on line holiday, getting out of bed with no new complaints.   9/5:  Hyponatremic (120m40m  Fluid restriction.  Increase diuresis.  Added Sevelamer.   Past Medical History: No past medical history on file.    Past Surgical History: No past surgical history on file.   Social History:  Social History            Socioeconomic History   . Marital status: Divorced       Spouse name: Not on file   . Number of children: Not on file   . Years of education: Not on file   . Highest education level: Not on file   Occupational History   . Not on file   Tobacco Use   .  Smoking status: Not on file   . Smokeless tobacco: Not on file   Substance and Sexual Activity   . Alcohol use: Not on file   . Drug use: Not on file   . Sexual activity: Not on file   Other Topics Concern   . Not on file   Social History Narrative   . Not on file      Social Determinants of Health      Financial Resource Strain: Not on file   Food Insecurity: Not on file   Transportation Needs: Not on file   Physical Activity: Not on file   Stress: Not on file   Intimate Partner Violence: Not on file   Housing Stability: Not on file       Family  History: Family Medical History:       None                Allergies: No Known Allergies   ROS: Continue to have improved pain and shortness of breath   No other new complaints       Problem List:       Patient Active Problem List   Diagnosis   . Endocarditis         Medications: Reviewed      Last VS:    .  Filed Vitals:    02/19/21 0600 02/19/21 0700 02/19/21 0716 02/19/21 0800   BP: 122/69 (!) 112/57  (!) 151/74   Pulse: 92 91  89   Resp: 20 16 (!) 39 (!) 32   Temp:    37 C (98.6 F)   SpO2: 97% 95% 95% 98%        Physical Exam  Vitals reviewed.   Constitutional:       Appearance: She is ill-appearing.      Comments:  Acutely ill appearing female, no acute distress, becomes restless at times   HENT:      Head: Normocephalic.      Mouth/Throat:      Mouth: Mucous membranes are moist.   Eyes:      Extraocular Movements: Extraocular movements intact.      Conjunctiva/sclera: Conjunctivae normal.      Pupils: Pupils are equal, round, and reactive to light.   Cardiovascular:      Rate and Rhythm: Regular rhythm.      Pulses: Normal pulses.   Pulmonary:      Comments: Diminished breath sounds on right posterior.  +Diffuse crackles on left posterior    Abdominal:      General: Abdomen is flat. There is no distension.      Palpations: Abdomen is soft. There is no mass.      Hernia: No hernia is present.      Comments:  +bowel sounds   Musculoskeletal:         General: Swelling present. Normal range of motion.      Cervical back: No rigidity.      Right lower leg: Edema present.      Left lower leg: Edema present.   Skin:     General: Skin is warm and dry.   Neurological:      General: No focal deficit present.      Mental Status: She is alert and oriented to person, place, and time.   GCS 15  Psychiatric:      Comments: Anxious       _0 I have reviewed the patient's vitals signs, the nursing notes, the  24 notes and other progress notes      _0 I have reviewed the laboratory and imaging data             Systems  Based Assessment and Plan:     Neurologic:  Data/Assessment Reviewed    Diagnosis Altered mental status 2/2 encephalopathy   Overdose 2/2 IVDU  IVDA withdrawal  Back pain    Course:  improving    Plan Continue oral oxy   Wean Seroquel qHS  Pain/Psych consult    MRI Brain 8/23 negative  MRI of the cervical, thoracic, and lumbosacral spine completed to r/o abscess; negative   Day/night policy, reorientation as needed  PT/OT/Cardiac rehab   Serial neuro checks      Cardiovascular:  Data/Assessment Most Recent Hemodynamics: Reviewed   Cardiovascular support: N/A       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; s/p PFO closure  Moderate TR  Mild Pulmonary HTN  Hypertension    Course Improving    Plan Continue lopressor 12.68m BID; titrate as tolerated   Coumadin QHS  Continue heparin gtt per SCT   Continue ASA       Respiratory:  Data/Assessment: CXR: Reviewed   Most recent VBG: reviewed  Vent Settings: NA    Diagnosis Acute respiratory failure 2/2 Aspiration PNA and altered mental status  Septic pulmonary emboli  Smoker  MRSA PNA   Mechanical ventilation post op    Course stable   Plan FiO2 wean as tolerated for O2 sats>92%  Pulm toilet; scheduled nebs  Repeat CXR in am     Renal:  Data/Assessment: Reviewed    Diagnosis Asymptomatic Moderate Hyponatremia (Hypotonic), likely 2/2 recent AKI/Renal failure  Acute kidney injury - multifactorial: septic shock 2/2 MRSA TV endocarditis, IVDU  Hypervolemia and anasarca   Course As expected   Plan Lasix IV BID, increasing  No indication for RRT at this time   Last HD (8/31)   Chem Q4, SeOsm, fluid restriction  Albumin Q6  Replacing electrolytes per order  Strict I&O  Renally adjust medications   Avoid nephrotoxic agents       Gastrointestinal:  Data/Assessment: Protonix for stress ulcer prophylaxis   Diet:  Taking PO     Bowel Regimen:  Senna  Miralax     Diagnosis Dysphagia  Enlarged liver  Distended gallbladder with mild elevation in Alk phos and  bilirubin  constipation   Course  Stable    Plan Increased bowel regimen; give MOM and suppository   Received relastor 9/1  Advance diet as tolerated   Continue stress ulcer prophylaxis   Speech/bedside to evaluate swallow; passed       Endocrine:  Data/Assessment Last three Fingerstick glucose: Reviewed   Last HbA1C: NA  Most recent Thyroid results: 2.3      Diagnosis Hyperglycemia    Course Stable   Plan Target Goal 140-180  SSI Protocol PRN       Hematologic:  Data/Assessment:  Reviewed    Diagnosis Anemia of chronic disease and Thrombocytopenia   Course Stable   Plan SCDs (Sequential Compression Device)  For DVT Prophylaxis  Continue heparin gtt for TVR   Coumadin QHS  Continue to closely monitor cbc/coags   Monitor CT output      Infectious Diseases:  Data/Assessment Tmax last 24 hrs: Reviewed        Diagnosis MRSA TV Endocarditis  MRSA PNA    Course guarded   Plan Repeat BC (9/2)   ID asked  to reevaluate patient today   Plan for IV Vanc per protocol x 6 weeks from negative BC  Due to rash will transition to daptomycin 18m/kg q48h   BAL 8/30 + MRSA   [facility]   BC 9/1 = + GPC   BC 9/2 = NGTD       Antibiotic therapy  Agent:  Vancomycin  Indication: MRSA      Will continue to monitor for additional s/s of infection         Family Communication and Disposition:  Data/Assessment Patient updated with plan of care     Dipso: Transfer to SDU held 2/2 moderate hyponatremia      This patient suffers from IVDA, drug overdose, aspiration PNA requiring intubation, MRSAPNA and MRSA IE of TV w/moderate TR &bacteremia. TVR (8/30) continues on heparin gtt while dosing coumadin QHS.  AKI requiring CRRT, IHD. (currently making urine, not receiving HD).  UDOA +heroin &methamphetamines. C/w Abxas per ID recs.(Currently on Rifampin/Daptomycin; monitor LFTs & CPK)  Increase diuresis & monitor response.  Giving albumin.  B/l pitting edema.  Monitor chem Q4.  Hyponatremia likely 2/2 recent renal failure/AKI.   +Malnutrition-->f/u nutrition consult.  Wean seroquel.  Maintain Mg2+>2, K+>4.    Critical Care Attending Attestation  I was present at the bedside of this critically ill patient.  I saw and examined the patient and discussed the patient with the CVICU team.    The care of this patient was in regard to managing condition(s) that have a high probability of sudden, clinically significant or life-threatening deterioration and require a high degree of attending physician attention.  The data reviewed and care planning were performed in direct proximity of the patient.  All critical care time was spent exclusive of procedures which will be documented elsewhere in the chart.  My critical care time is independent and unique to other providers.  Medications, allergies, vital signs, lab tests, imaging, nursing notes and physician notes have been reviewed.  345m independent provider CCM time in overall assessment, review, documentation, family discussions; excludes procedures.  AnJuan QuamMD  HVI CCM

## 2021-02-19 NOTE — Ancillary Notes (Signed)
[facility]  Cardiac Rehab Exercise Information      [name] [name], [name] [name], 35 y.o.   Date of Birth:  07/14/1985  Allergies: No Known Allergies    EXERCISE INFORMATION:  Resting heart rate: 89    Resting blood pressure: 144/84    Oxygen Liter flow (L/min): 0 L/min    Resting SpO2: 98 %    Exercise type: Ambulate (Mask worn in hall)    Distance: 132f, no breaks    Rate of perceived exertion: 10/20    Pain scale: 5/10    Symptoms: Shortness of breath    Assistance: x2 standby    Maximum heart rate: 104    Recovery heart rate: 98    Recovery blood pressure: 144/97    Recovery SpO2: 98    [name] M.S.  Cardiac Rehab/ Exercise Physiologist   [phone]

## 2021-02-19 NOTE — Care Plan (Signed)
Patient is 6 days s/p AKI and discovered MVregurg. VSS and no acute events on this shift. Patient is alert and oriented x4. Patient is in contact isolation for MRSA.  Patient complains of lower back pain; medicated as ordered with Tylenol and Oxycodone PRN. Patient on room air. Patient is on a mech soft/ renal diet ; tolerating well. Ambulates with standby. Fall precautions in place; sitter select in use and functioning properly. Patient resting in bed with call bell within reach. No signs or symptoms of distress noted at this time.    Problem: Adult Inpatient Plan of Care  Goal: Plan of Care Review  Outcome: Ongoing (see interventions/notes)  Goal: Patient-Specific Goal (Individualized)  Outcome: Ongoing (see interventions/notes)  Goal: Absence of Hospital-Acquired Illness or Injury  Outcome: Ongoing (see interventions/notes)  Intervention: Identify and Manage Fall Risk  Recent Flowsheet Documentation  Taken 02/19/2021 0700 by Tedra Coupe, RN  Safety Promotion/Fall Prevention: safety round/check completed  Taken 02/19/2021 0600 by Tedra Coupe, RN  Safety Promotion/Fall Prevention: safety round/check completed  Taken 02/19/2021 0500 by Tedra Coupe, RN  Safety Promotion/Fall Prevention: safety round/check completed  Taken 02/19/2021 0400 by Tedra Coupe, RN  Safety Promotion/Fall Prevention:   activity supervised   fall prevention program maintained   nonskid shoes/slippers when out of bed   motion sensor pad activated   safety round/check completed  Taken 02/19/2021 0300 by Tedra Coupe, RN  Safety Promotion/Fall Prevention: safety round/check completed  Taken 02/19/2021 0200 by Tedra Coupe, RN  Safety Promotion/Fall Prevention: safety round/check completed  Taken 02/19/2021 0100 by Tedra Coupe, RN  Safety Promotion/Fall Prevention: safety round/check completed  Taken 02/19/2021 0000 by Tedra Coupe, RN  Safety Promotion/Fall Prevention:   activity supervised   fall prevention program maintained   motion sensor pad activated   nonskid shoes/slippers  when out of bed   safety round/check completed  Taken 02/18/2021 2300 by Tedra Coupe, RN  Safety Promotion/Fall Prevention: safety round/check completed  Taken 02/18/2021 2200 by Tedra Coupe, RN  Safety Promotion/Fall Prevention: safety round/check completed  Taken 02/18/2021 2100 by Tedra Coupe, RN  Safety Promotion/Fall Prevention: safety round/check completed  Taken 02/18/2021 2000 by Tedra Coupe, RN  Safety Promotion/Fall Prevention:   activity supervised   fall prevention program maintained   motion sensor pad activated   nonskid shoes/slippers when out of bed   safety round/check completed  Intervention: Prevent Skin Injury  Recent Flowsheet Documentation  Taken 02/19/2021 0600 by Tedra Coupe, RN  Body Position:   sitting   positioned/repositioned independently   neutral body alignment   neutral head position   neutral head position, midline maintained  Taken 02/19/2021 0400 by Tedra Coupe, RN  Body Position:   supine, head elevated   positioned/repositioned independently   neutral head position   neutral body alignment   neutral head position, midline maintained  Taken 02/19/2021 0200 by Tedra Coupe, RN  Body Position:   sitting   positioned/repositioned independently   neutral body alignment   neutral head position  Taken 02/19/2021 0000 by Tedra Coupe, RN  Body Position:   sitting   positioned/repositioned independently   neutral body alignment   neutral head position  Taken 02/18/2021 2200 by Tedra Coupe, RN  Body Position:   sitting   positioned/repositioned independently   supine, legs elevated   neutral body alignment   neutral head position   neutral head position, midline maintained  Taken 02/18/2021 2000 by Tedra Coupe, RN  Body Position:   sitting   positioned/repositioned  independently   supine, legs elevated   neutral body alignment   neutral head position  Intervention: Prevent and Manage VTE (Venous Thromboembolism) Risk  Recent Flowsheet Documentation  Taken 02/18/2021 2000 by Tedra Coupe, RN  VTE Prevention/Management:   ambulation  promoted   anticoagulant therapy maintained   dorsiflexion/plantar flexion performed  Goal: Optimal Comfort and Wellbeing  Outcome: Ongoing (see interventions/notes)  Intervention: Provide Person-Centered Care  Recent Flowsheet Documentation  Taken 02/18/2021 2000 by Tedra Coupe, RN  Trust Relationship/Rapport:   care explained   choices provided   emotional support provided   empathic listening provided   questions answered   questions encouraged   thoughts/feelings acknowledged  Goal: Rounds/Family Conference  Outcome: Ongoing (see interventions/notes)     Problem: Skin Injury Risk Increased  Goal: Skin Health and Integrity  Outcome: Ongoing (see interventions/notes)  Intervention: Optimize Skin Protection  Recent Flowsheet Documentation  Taken 02/19/2021 0600 by Tedra Coupe, RN  Head of Bed [facility]) Positioning: (UP in chair) other (see comments)  Taken 02/19/2021 0400 by Tedra Coupe, RN  Head of Bed [facility]) Positioning: HOB at 30-45 degrees  Taken 02/19/2021 0200 by Tedra Coupe, RN  Head of Bed [facility]) Positioning: (Up in chair) other (see comments)  Taken 02/19/2021 0000 by Tedra Coupe, RN  Head of Bed [facility]) Positioning: (Up in chair) other (see comments)  Taken 02/18/2021 2200 by Tedra Coupe, RN  Head of Bed [facility]) Positioning: (Up in chair) other (see comments)  Taken 02/18/2021 2000 by Tedra Coupe, RN  Pressure Reduction Techniques:   (L,M,H,VH)Frequent Turning   frequent weight shift encouraged   pressure points protected   weight shift assistance provided  Pressure Reduction Devices:   (L.M,H,VH) Heel offloading device utilized   (L.M,H,VH) Pressure redistributing mattress utilized   (L.M,H,VH) Specialty Bed utilized   heels elevated off bed  Skin Protection:   adhesive use limited   antimicrobial wipes   electrode sites changed   incontinence pads utilized   pulse oximeter probe site changed   preventative decubiti skin protection foam dressing applied/intact   skin-to-device areas padded   skin-to-skin areas padded   transparent dressing  maintained   tubing/devices free from skin contact  Head of Bed (HOB) Positioning: (Up in chair) other (see comments)     Problem: Infection Progression (Sepsis)  Goal: Absence of Infection Signs and Symptoms  Outcome: Ongoing (see interventions/notes)  Intervention: Initiate Sepsis Management  Recent Flowsheet Documentation  Taken 02/19/2021 0400 by Tedra Coupe, RN  Isolation Precautions: contact precautions maintained  Taken 02/19/2021 0000 by Tedra Coupe, RN  Isolation Precautions: contact precautions maintained  Taken 02/18/2021 2000 by Tedra Coupe, RN  Isolation Precautions: contact precautions maintained  Intervention: Promote Stabilization  Recent Flowsheet Documentation  Taken 02/18/2021 2000 by Tedra Coupe, RN  Fever Reduction/Comfort Measures:   lightweight bedding   lightweight clothing  Intervention: Promote Recovery  Recent Flowsheet Documentation  Taken 02/19/2021 0530 by Tedra Coupe, RN  Activity Management:   activity adjusted per tolerance   activity clustered for rest periods   activity encouraged   ambulated in room   up in chair  Taken 02/19/2021 0215 by Tedra Coupe, RN  Activity Management:   activity adjusted per tolerance   activity clustered for rest periods   activity encouraged   ambulated in room   ambulated to bathroom   dorsiflexion, plantar flexion encouraged   ROM, active encouraged   returned to bed  Taken 02/18/2021 2000 by Tedra Coupe, RN  Activity Management:  activity adjusted per tolerance   activity clustered for rest periods   activity encouraged   dorsiflexion, plantar flexion encouraged   ROM, active encouraged   up in chair     Problem: Fall Injury Risk  Goal: Absence of Fall and Fall-Related Injury  Outcome: Ongoing (see interventions/notes)  Intervention: Identify and Manage Contributors  Recent Flowsheet Documentation  Taken 02/18/2021 2000 by Tedra Coupe, RN  Self-Care Promotion:   independence encouraged   BADL personal objects within reach  Intervention: Promote Injury-Free Environment  Recent  Flowsheet Documentation  Taken 02/19/2021 0700 by Tedra Coupe, RN  Safety Promotion/Fall Prevention: safety round/check completed  Taken 02/19/2021 0600 by Tedra Coupe, RN  Safety Promotion/Fall Prevention: safety round/check completed  Taken 02/19/2021 0500 by Tedra Coupe, RN  Safety Promotion/Fall Prevention: safety round/check completed  Taken 02/19/2021 0400 by Tedra Coupe, RN  Safety Promotion/Fall Prevention:   activity supervised   fall prevention program maintained   nonskid shoes/slippers when out of bed   motion sensor pad activated   safety round/check completed  Taken 02/19/2021 0300 by Tedra Coupe, RN  Safety Promotion/Fall Prevention: safety round/check completed  Taken 02/19/2021 0200 by Tedra Coupe, RN  Safety Promotion/Fall Prevention: safety round/check completed  Taken 02/19/2021 0100 by Tedra Coupe, RN  Safety Promotion/Fall Prevention: safety round/check completed  Taken 02/19/2021 0000 by Tedra Coupe, RN  Safety Promotion/Fall Prevention:   activity supervised   fall prevention program maintained   motion sensor pad activated   nonskid shoes/slippers when out of bed   safety round/check completed  Taken 02/18/2021 2300 by Tedra Coupe, RN  Safety Promotion/Fall Prevention: safety round/check completed  Taken 02/18/2021 2200 by Tedra Coupe, RN  Safety Promotion/Fall Prevention: safety round/check completed  Taken 02/18/2021 2100 by Tedra Coupe, RN  Safety Promotion/Fall Prevention: safety round/check completed  Taken 02/18/2021 2000 by Tedra Coupe, RN  Safety Promotion/Fall Prevention:   activity supervised   fall prevention program maintained   motion sensor pad activated   nonskid shoes/slippers when out of bed   safety round/check completed     Problem: Acute Rehab Services Goal & Intervention Plan  Goal: Gait Training Goal  Description: Stand Alone Therapy Goal  Outcome: Ongoing (see interventions/notes)  Goal: Interprofessional Goal  Description: Stand Alone Therapy Goal  Outcome: Ongoing (see interventions/notes)  Goal: Occupational  Therapy Goals  Description: Stand Alone Therapy Goal  Outcome: Ongoing (see interventions/notes)  Goal: Physical Therapy Goal  Description: Stand Alone Therapy Goal  Outcome: Ongoing (see interventions/notes)  Goal: Toileting Goal  Description: Stand Alone Therapy Goal  Outcome: Ongoing (see interventions/notes)     Problem: Pain Acute  Goal: Acceptable Pain Control and Functional Ability  Outcome: Ongoing (see interventions/notes)  Intervention: Optimize Psychosocial Wellbeing  Recent Flowsheet Documentation  Taken 02/18/2021 2000 by Tedra Coupe, RN  Diversional Activities: television     Problem: Adjustment to Surgery (Cardiovascular Surgery)  Goal: Optimal Coping with Heart Surgery  Outcome: Ongoing (see interventions/notes)     Problem: Pain (Cardiovascular Surgery)  Goal: Acceptable Pain Control  Outcome: Ongoing (see interventions/notes)

## 2021-02-20 ENCOUNTER — Inpatient Hospital Stay (HOSPITAL_COMMUNITY): Payer: Medicaid Other

## 2021-02-20 DIAGNOSIS — Z952 Presence of prosthetic heart valve: Secondary | ICD-10-CM

## 2021-02-20 DIAGNOSIS — X58XXXA Exposure to other specified factors, initial encounter: Secondary | ICD-10-CM

## 2021-02-20 DIAGNOSIS — J181 Lobar pneumonia, unspecified organism: Secondary | ICD-10-CM

## 2021-02-20 DIAGNOSIS — R609 Edema, unspecified: Secondary | ICD-10-CM

## 2021-02-20 DIAGNOSIS — Y929 Unspecified place or not applicable: Secondary | ICD-10-CM

## 2021-02-20 DIAGNOSIS — S1095XA Superficial foreign body of unspecified part of neck, initial encounter: Secondary | ICD-10-CM

## 2021-02-20 DIAGNOSIS — Y939 Activity, unspecified: Secondary | ICD-10-CM

## 2021-02-20 DIAGNOSIS — J90 Pleural effusion, not elsewhere classified: Secondary | ICD-10-CM

## 2021-02-20 DIAGNOSIS — I517 Cardiomegaly: Secondary | ICD-10-CM

## 2021-02-20 DIAGNOSIS — Y999 Unspecified external cause status: Secondary | ICD-10-CM

## 2021-02-20 LAB — MAGNESIUM
MAGNESIUM: 2.4 mg/dL (ref 1.8–2.6)
MAGNESIUM: 2.2 mg/dL (ref 1.8–2.6)

## 2021-02-20 LAB — BASIC METABOLIC PANEL
ANION GAP: 11 mmol/L (ref 4–13)
ANION GAP: 10 mmol/L (ref 4–13)
ANION GAP: 10 mmol/L (ref 4–13)
ANION GAP: 13 mmol/L (ref 4–13)
BUN/CREA RATIO: 29 — ABNORMAL HIGH (ref 6–22)
BUN/CREA RATIO: 30 — ABNORMAL HIGH (ref 6–22)
BUN/CREA RATIO: 27 — ABNORMAL HIGH (ref 6–22)
BUN/CREA RATIO: 33 — ABNORMAL HIGH (ref 6–22)
BUN: 57 mg/dL — ABNORMAL HIGH (ref 8–25)
BUN: 56 mg/dL — ABNORMAL HIGH (ref 8–25)
BUN: 56 mg/dL — ABNORMAL HIGH (ref 8–25)
BUN: 56 mg/dL — ABNORMAL HIGH (ref 8–25)
CALCIUM: 7.9 mg/dL — ABNORMAL LOW (ref 8.5–10.0)
CALCIUM: 8 mg/dL — ABNORMAL LOW (ref 8.5–10.0)
CALCIUM: 8.4 mg/dL — ABNORMAL LOW (ref 8.5–10.0)
CALCIUM: 8.7 mg/dL (ref 8.5–10.0)
CHLORIDE: 97 mmol/L (ref 96–111)
CHLORIDE: 96 mmol/L (ref 96–111)
CHLORIDE: 97 mmol/L (ref 96–111)
CHLORIDE: 95 mmol/L — ABNORMAL LOW (ref 96–111)
CO2 TOTAL: 17 mmol/L — ABNORMAL LOW (ref 22–30)
CO2 TOTAL: 18 mmol/L — ABNORMAL LOW (ref 22–30)
CO2 TOTAL: 19 mmol/L — ABNORMAL LOW (ref 22–30)
CO2 TOTAL: 18 mmol/L — ABNORMAL LOW (ref 22–30)
CREATININE: 1.99 mg/dL — ABNORMAL HIGH (ref 0.60–1.05)
CREATININE: 1.89 mg/dL — ABNORMAL HIGH (ref 0.60–1.05)
CREATININE: 2.06 mg/dL — ABNORMAL HIGH (ref 0.60–1.05)
CREATININE: 1.71 mg/dL — ABNORMAL HIGH (ref 0.60–1.05)
ESTIMATED GFR: 33 mL/min/BSA — ABNORMAL LOW (ref >=60)
ESTIMATED GFR: 35 mL/min/BSA — ABNORMAL LOW (ref >=60)
ESTIMATED GFR: 32 mL/min/BSA — ABNORMAL LOW (ref >=60)
ESTIMATED GFR: 40 mL/min/BSA — ABNORMAL LOW (ref >=60)
GLUCOSE: 101 mg/dL (ref 65–125)
GLUCOSE: 95 mg/dL (ref 65–125)
GLUCOSE: 97 mg/dL (ref 65–125)
GLUCOSE: 84 mg/dL (ref 65–125)
POTASSIUM: 4.4 mmol/L (ref 3.5–5.1)
POTASSIUM: 4.6 mmol/L (ref 3.5–5.1)
POTASSIUM: 4.4 mmol/L (ref 3.5–5.1)
POTASSIUM: 4.2 mmol/L (ref 3.5–5.1)
SODIUM: 125 mmol/L — ABNORMAL LOW (ref 136–145)
SODIUM: 124 mmol/L — ABNORMAL LOW (ref 136–145)
SODIUM: 126 mmol/L — ABNORMAL LOW (ref 136–145)
SODIUM: 126 mmol/L — ABNORMAL LOW (ref 136–145)

## 2021-02-20 LAB — H & H
HCT: 22.3 % — ABNORMAL LOW (ref 34.8–46.0)
HCT: 24.1 % — ABNORMAL LOW (ref 34.8–46.0)
HGB: 7.5 g/dL — ABNORMAL LOW (ref 11.5–16.0)
HGB: 8.2 g/dL — ABNORMAL LOW (ref 11.5–16.0)

## 2021-02-20 LAB — PT/INR
INR: 3.13 — ABNORMAL HIGH (ref 0.80–1.20)
INR: 3.67 — ABNORMAL HIGH (ref 0.80–1.20)
PROTHROMBIN TIME: 36.8 seconds — ABNORMAL HIGH (ref 9.1–13.9)
PROTHROMBIN TIME: 43.3 seconds — ABNORMAL HIGH (ref 9.1–13.9)

## 2021-02-20 LAB — CBC
HCT: 23.2 % — ABNORMAL LOW (ref 34.8–46.0)
HGB: 7.5 g/dL — ABNORMAL LOW (ref 11.5–16.0)
MCH: 26.7 pg (ref 26.0–32.0)
MCHC: 32.3 g/dL (ref 31.0–35.5)
MCV: 82.6 fL (ref 78.0–100.0)
MPV: 8.3 fL — ABNORMAL LOW (ref 8.7–12.5)
PLATELETS: 350 10*3/uL (ref 150–400)
RBC: 2.81 10*6/uL — ABNORMAL LOW (ref 3.85–5.22)
RDW-CV: 19.4 % — ABNORMAL HIGH (ref 11.5–15.5)
WBC: 8.9 10*3/uL (ref 3.7–11.0)

## 2021-02-20 LAB — PHOSPHORUS
PHOSPHORUS: 6.8 mg/dL — ABNORMAL HIGH (ref 2.4–4.7)
PHOSPHORUS: 6.6 mg/dL — ABNORMAL HIGH (ref 2.4–4.7)

## 2021-02-20 MED ORDER — SODIUM CHLORIDE 0.9 % INTRAVENOUS SOLUTION
8.0000 mg/kg | INTRAVENOUS | Status: AC
Start: 2021-02-20 — End: 2021-03-01
  Administered 2021-02-20: 550 mg via INTRAVENOUS
  Administered 2021-02-20 – 2021-02-21 (×2): 0 mg via INTRAVENOUS
  Administered 2021-02-21 – 2021-02-22 (×2): 550 mg via INTRAVENOUS
  Administered 2021-02-22: 0 mg via INTRAVENOUS
  Administered 2021-02-23: 550 mg via INTRAVENOUS
  Administered 2021-02-23: 11:00:00 0 mg via INTRAVENOUS
  Administered 2021-02-24: 550 mg via INTRAVENOUS
  Administered 2021-02-24 – 2021-02-25 (×2): 0 mg via INTRAVENOUS
  Administered 2021-02-25: 550 mg via INTRAVENOUS
  Administered 2021-02-26: 0 mg via INTRAVENOUS
  Administered 2021-02-26 – 2021-02-27 (×2): 550 mg via INTRAVENOUS
  Administered 2021-02-27 – 2021-02-28 (×2): 0 mg via INTRAVENOUS
  Administered 2021-02-28: 550 mg via INTRAVENOUS
  Administered 2021-03-01: 0 mg via INTRAVENOUS
  Administered 2021-03-01: 550 mg via INTRAVENOUS
  Filled 2021-02-20 (×11): qty 11

## 2021-02-20 MED ORDER — BUMETANIDE 0.25 MG/ML INJECTION SOLUTION
2.0000 mg | Freq: Four times a day (QID) | INTRAMUSCULAR | Status: DC
Start: 2021-02-20 — End: 2021-02-23
  Administered 2021-02-20 – 2021-02-23 (×13): 2 mg via INTRAVENOUS
  Filled 2021-02-20 (×13): qty 8

## 2021-02-20 MED ORDER — ALBUMIN, HUMAN 25 % INTRAVENOUS SOLUTION
25.0000 g | Freq: Four times a day (QID) | INTRAVENOUS | Status: AC
Start: 2021-02-20 — End: 2021-02-21
  Administered 2021-02-20 (×2): 25 g via INTRAVENOUS
  Administered 2021-02-20 – 2021-02-21 (×3): 0 g via INTRAVENOUS
  Administered 2021-02-21: 25 g via INTRAVENOUS
  Filled 2021-02-20: qty 50
  Filled 2021-02-20: qty 100
  Filled 2021-02-20: qty 50
  Filled 2021-02-20: qty 100
  Filled 2021-02-20: qty 50

## 2021-02-20 MED ORDER — METOPROLOL TARTRATE 25 MG TABLET
25.0000 mg | ORAL_TABLET | Freq: Two times a day (BID) | ORAL | Status: DC
Start: 2021-02-20 — End: 2021-03-02
  Administered 2021-02-20 – 2021-03-02 (×21): 25 mg via ORAL
  Filled 2021-02-20 (×21): qty 1

## 2021-02-20 NOTE — Nurses Notes (Signed)
Patient reassessment completed. No change from previous assessment, at this time. Patient resting comfortably. Will continue to monitor.

## 2021-02-20 NOTE — Progress Notes (Signed)
[facility]  HVI Critical Care Consult/Progress Note     [name] [name], [place], 35 y.o. female  Date of Birth: August 21, 1985  Medical Record Number:  [id number]   Inpatient Admission Date: 02/05/2021   Hospital Day:  LOS: 0 days      Chief Complaint: Altered Mental Status   History of Present Illness: Per previous providers: "Pt is a 35 yo female with a significant PMH for Smoking and IVDU presented to [name] with AMS on 01/31/21. She was found to have a drug overdose, aspiration PNA requiring intubation, MRSA bacteremia, large TV endocarditis with moderate TR and elevated systemic pulmonary pressures (72mhg), AKI requiring HD, anemia requiring PRBC and thrombocytopenia. Urine drug screen + for heroin and methamphetamines.     [place]."   [facility]/ [facility]: [facility]  8/30: On pump beating heart TVR (Epic 33)  PFO colsure   Hospital Course/Major events:  8/22: See above.   -Prop for sedation. Agitated; MAE; does not follow. Repeat CT Head/CAP. Hemodynamics stable. Intubated. Repeated blood cultures. Continue Vanc.   8/23: had pan CT 8/22, still not awakening/folowing commands on precedex  But agitated. MRI brain ordered, started on CRRT  8/24: remains on CRRT, MRI Brain no abnormality, following commands on dexmedetomidine, quetiapine, prn dilaudid  8/25 brocnh today for increased ETT secretions,plan for OR early next week, OMFS following for dental root extriction.  Pt self-extubated this afternoon.  Doing well on NC.  8/26: Intermittent delirium. Weak. Continue Seroquel.  Hemodynamics stable. NC. Aggressive pulm toilet. Continue nebs. Speech to see. Dental procedure today. Continues + BC. ID following; MRI CTLS ordered. Continue antibiotics. Surgical plan TBA.   8/27:  MRI completed today. Intermittent dex. Surgical plan TBA. HD today per nephology. Bedside speech and swallow. Blood  repeat Cx tomorrow.   8/28 repeat TTE today for OR later this week, HD holiday will give 200 mg Lasix for diuresis (followed by diuril 5019mif no response w/lasix) and D/C [place] for line holiday  8/29: OR tomorrow. Agitation. Seroquel. Hemodynamics stable. HD today prior to surgery. New line placed 2/2 line holiday. 2 u PRBC pre op optimization.   8/30: OR for TVR, PFO closure, out of OR intubated, levophed infusing, normal EF  8/31: OBT, deline, continue CT, HD today, repeat BC  9/1: hypertensive; started lopressor 6.2539mID, repeat BC, lasix 80 IV BID started, holding HD, repeat BC   9/2: Decrease Seroquel, increase BB, d/c pacer wires and groin bumper, lasix 200, Diuril 500, place trialysis line, give 1st dose coumadin tonight, repeat BC [facility]9/3: switched to daptomycin from vancomycin due to rash.   9/4: continues on line holiday, getting out of bed with no new complaints.   9/5:  Hyponatremic (120m55m  Fluid restriction.  Increase diuresis.  Added Sevelamer.  Received 1 dose of diuril, held 2/2 hyponatremia.  9/6: Continue Seroquel. PT/OT. INR for potential coumadin dose. Repeat H/H dose. Pulm toilet. Encourage PO intake. Bumex 2mg 61mfor diuresis. No dialysis indication today. Continue to monitor BMP (sodium). Salt tabs. Possible tx to SDS.    Past Medical History: No past medical history on file.    Past Surgical History: No past surgical history on file.   Social History:  Social History            Socioeconomic History   . Marital status: Divorced       Spouse  name: Not on file   . Number of children: Not on file   . Years of education: Not on file   . Highest education level: Not on file   Occupational History   . Not on file   Tobacco Use   . Smoking status: Not on file   . Smokeless tobacco: Not on file   Substance and Sexual Activity   . Alcohol use: Not on file   . Drug use: Not on file   . Sexual activity: Not on file   Other Topics Concern   . Not on file   Social History Narrative   . Not on file       Social Determinants of Health      Financial Resource Strain: Not on file   Food Insecurity: Not on file   Transportation Needs: Not on file   Physical Activity: Not on file   Stress: Not on file   Intimate Partner Violence: Not on file   Housing Stability: Not on file       Family History: Family Medical History:       None                Allergies: No Known Allergies   ROS: Continue to have improved pain and shortness of breath   No other new complaints       Problem List:       Patient Active Problem List   Diagnosis   . Endocarditis         Medications: Reviewed      Last VS:    .  Filed Vitals:    02/20/21 0515 02/20/21 0530 02/20/21 0545 02/20/21 0600   BP:    116/65   Pulse: 93 96 95 96   Resp:  20 20    Temp:       SpO2: 97% 98% 97% 98%        Physical Exam  Vitals reviewed.   Constitutional:       Appearance: She is ill-appearing.      Comments:  Acutely ill appearing female, no acute distress, becomes restless at times   HENT:      Head: Normocephalic.      Mouth/Throat:      Mouth: Mucous membranes are moist.   Eyes:       Pupils: Pupils are equal, round, and reactive to light.   Cardiovascular:      Rate and Rhythm: Regular rhythm.      Pulses: Normal pulses.   Pulmonary:      Comments: Diminished breath sounds     Abdominal:      General: Abdomen is flat. There is no distension.      Palpations: Abdomen is soft. There is no mass.      Hernia: No hernia is present.      Comments:  +bowel sounds   Musculoskeletal:         General: Swelling present. Normal range of motion.      Cervical back: No rigidity.      Right lower leg: Edema present.      Left lower leg: Edema present.   Skin:     General: Skin is warm and dry. B/l lower extremities with palpable purpura   Neurological:      General: No focal deficit present.      Mental Status: She is alert and oriented to person, place, and time.   GCS 15  Psychiatric:  Comments: Anxious       _0 I have reviewed the patient's vitals signs, the nursing  notes, the physician's notes and other progress notes      _1 I have reviewed the laboratory and imaging data        Pt was reviewed with CCM attending and SCT      Systems Based Assessment and Plan:     Neurologic:  Data/Assessment Reviewed    Diagnosis Altered mental status 2/2 encephalopathy   Overdose 2/2 IVDU  IVDA withdrawal  Back pain    Course:  improving    Plan Continue oral oxy   Wean Seroquel qHS; continue melatonin   Pain/Psych consult    MRI Brain 8/23 negative  MRI of the cervical, thoracic, and lumbosacral spine completed to r/o abscess; negative   Day/night policy, reorientation as needed  PT/OT/Cardiac rehab   Serial neuro checks      Cardiovascular:  Data/Assessment Most Recent Hemodynamics: Reviewed   Cardiovascular support: N/A       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; s/p PFO closure  Moderate TR  Mild Pulmonary HTN  Hypertension    Course Improving    Plan Continue lopressor 52m BID; titrate as tolerated   Coumadin QHS  -----INR for potential coumadin dose today at 12 (INR 3.13)  Continue ASA   Serial hemodynamics       Respiratory:  Data/Assessment: CXR: Reviewed   Most recent VBG: reviewed  Vent Settings: NA    Diagnosis Acute respiratory failure 2/2 Aspiration PNA and altered mental status  Septic pulmonary emboli  Smoker  MRSA PNA   Mechanical ventilation post op    Course stable   Plan FiO2 wean as tolerated for O2 sats>92%  Pulm toilet; scheduled nebs  Repeat CXR in am   Diuresis   Continue Rifampin     Renal:  Data/Assessment: Reviewed    Diagnosis Asymptomatic Moderate Hyponatremia (Hypotonic), likely 2/2 recent AKI/Renal failure  Acute kidney injury - multifactorial: septic shock 2/2 MRSA TV endocarditis, IVDU  Hypervolemia and anasarca   Course Critical   Plan Lasix changed to Bumex [id number]  No indication for RRT at this time   Last HD (8/31)   Chem Q6, fluid restriction  Replacing electrolytes per order  Strict I&O  Renally adjust medications   Avoid nephrotoxic  agents       Gastrointestinal:  Data/Assessment: Protonix for stress ulcer prophylaxis   Diet:  Taking PO     Bowel Regimen:  Senna  Miralax     Diagnosis Dysphagia  Enlarged liver  Distended gallbladder with mild elevation in Alk phos and bilirubin  constipation   Course  Stable    Plan +BM  Advance diet as tolerated   Continue stress ulcer prophylaxis   Speech/bedside to evaluate swallow; passed       Endocrine:  Data/Assessment Last three Fingerstick glucose: Reviewed   Last HbA1C: N/A  Most recent Thyroid results:  2.3      Diagnosis Hyperglycemia    Course Stable   Plan Target Goal 140-180  SSI Protocol PRN       Hematologic:  Data/Assessment:  Reviewed    Diagnosis Anemia of chronic disease and Thrombocytopenia   Course Stable   Plan SCDs (Sequential Compression Device)  For DVT Prophylaxis  Coumadin QHS for TV; dose dependent on INR draw this afternoon   Continue to closely monitor cbc/coags   Monitor CT output      Infectious Diseases:  Data/Assessment Tmax last 24 hrs: Reviewed        Diagnosis MRSA TV Endocarditis  MRSA PNA    Course guarded   Plan Due to rash transitioned to daptomycin 41m/kg q48h on 02/17/21  BAL 8/30 + MRSA   [facility]   [facility]  [facility]   [facility] NGT[facility]     Derm consulted on 9/2 for B/L lower extremity purpura ; punch bx on RLE pending to r/o leukocytoclastic vasculitis 2/2 bacteremia; continue ointment.     Antibiotic therapy  Agent:  Dapto/Rifampin 6wks from negative blood cultures  Indication: MRSA PNA and bacteremia      Will continue to monitor for additional s/s of infection         Family Communication and Disposition:  Data/Assessment Patient updated with plan of care     I independently of the faculty provider spent a total of 42 minutes in direct/indirect care of this patient including initial evaluation, review of laboratory, radiology, diagnostic studies, review of medical record, order entry and coordination of care.  KKathaleen Bury APRN,AGACNP-BC  02/20/2021,  08:59      Attending Attestation:  This patient suffers from IVDA, drug overdose, aspiration PNA requiring intubation, MRSAPNA and MRSA IE of TV w/moderate TR &bacteremia. TVR (8/30).  Daily coumadin QHS.  AKI requiring CRRT, IHD this admission. (currently making urine, not receiving HD).  Has no indication for dialysis at this time.  UDOA +heroin &methamphetamines. C/w Abxas per ID recs.(Currently on Rifampin/Daptomycin; monitor LFTs & CPK)  Increased diuresis, changed to bumex; will cont to monitor response.  No indication for dialysis at this time.  Giving albumin.  Pt received 1 dose of diuril yesterday morning which was d/c'd 2/2 hyponatremia.  Hyponatremia improving.  Receiving salt tabs TID, [albumin], fluid restriction.  +B/l pitting edema.  Monitor chem Q4-6hrs.  Hyponatremia likely 2/2 recent renal failure/AKI.  +Malnutrition.Wean seroquel.  Maintain Mg2+>2, K+>4.  Pt seen and examined at bedside.  Pt d/w CVICU team.  This patient's major problems are listed above.  The data reviewed and care planning were performed in direct proximity of the patient.  Medications, allergies, vital signs, lab tests, imaging, nursing notes and physician notes have been reviewed.    341m indepdendent NonCCM time in overall assessment, review, documentation, family discussion; excludes procedure time.    AnJuan QuamMD  HVI CCM

## 2021-02-20 NOTE — Care Management Notes (Signed)
[facility]  Care Management Note    Patient Name: [name] [name]  Date of Birth: [birth year]  Sex: female  Date/Time of Admission: 02/05/2021 12:34 AM  Room/Bed: 19/A  Payor: [place] MEDICAID / Plan: [place] MEDICAID / Product Type: Medicaid [facility] /    LOS: 15 days   Primary Care Providers:  Pcp, No (General)    Admitting Diagnosis:  Endocarditis [I38]    Assessment:      02/20/21 1352   Assessment Detail   Assessment Type Continued Assessment   Date of Care Management Update 02/20/21   Date of Next DCP Update 02/23/21   Social Work Plan   Discharge Planning Status plan in progress   Projected Discharge Date 03/02/21   CM will evaluate for rehabilitation potential yes   Discharge Needs Assessment   Discharge Facility/Level of Care Needs Home (Patient/Family Member/other)(code 1)   Transportation Available car;family or friend will provide       Discharge Plan:  Home (Patient/Family Member/other) (code 1)  Per chart review, POD#7 s/p TVR. Pt with hx IVDU and endocarditis; pt is currently on IV daptomycin and PO rifampin, end dates TBD. Dermatology following. Nephrology following for HD needs. Pt is on a renal diet. PT/OT following; PT/OT evaluated the pt on 9/4 and at that time recommended home with assist, and no DME anticipated. D/C date and disposition TBD, pending pt's progress. MSW Jearld Shines will continue to follow.    The patient will continue to be evaluated for developing discharge needs.     Case Manager: Elease Hashimoto, [place]  Phone: [phone]

## 2021-02-20 NOTE — Care Plan (Signed)
Patient is 7 days s/p AKI and discovered MVregurg. VSS and no acute events on this shift. Patient is alert and oriented x4. Patient is in contact isolation for MRSA.  Patient complains of lower back pain; medicated as ordered with Tylenol and Oxycodone PRN. Patient on room air. Patient is on a mech soft/ renal diet ; tolerating well. Ambulates with standby. Fall precautions in place; sitter select in use and functioning properly. Patient resting in bed with call bell within reach. No signs or symptoms of distress noted at this time.      Problem: Adult Inpatient Plan of Care  Goal: Plan of Care Review  Outcome: Ongoing (see interventions/notes)  Goal: Patient-Specific Goal (Individualized)  Outcome: Ongoing (see interventions/notes)  Goal: Absence of Hospital-Acquired Illness or Injury  Outcome: Ongoing (see interventions/notes)  Intervention: Identify and Manage Fall Risk  Recent Flowsheet Documentation  Taken 02/20/2021 0600 by Tedra Coupe, RN  Safety Promotion/Fall Prevention: safety round/check completed  Taken 02/20/2021 0500 by Tedra Coupe, RN  Safety Promotion/Fall Prevention: safety round/check completed  Taken 02/20/2021 0400 by Tedra Coupe, RN  Safety Promotion/Fall Prevention:   activity supervised   fall prevention program maintained   motion sensor pad activated   nonskid shoes/slippers when out of bed   safety round/check completed  Taken 02/20/2021 0300 by Tedra Coupe, RN  Safety Promotion/Fall Prevention: safety round/check completed  Taken 02/20/2021 0200 by Tedra Coupe, RN  Safety Promotion/Fall Prevention: safety round/check completed  Taken 02/20/2021 0100 by Tedra Coupe, RN  Safety Promotion/Fall Prevention: safety round/check completed  Taken 02/20/2021 0000 by Tedra Coupe, RN  Safety Promotion/Fall Prevention:   activity supervised   fall prevention program maintained   motion sensor pad activated   nonskid shoes/slippers when out of bed   safety round/check completed  Taken 02/19/2021 2300 by Tedra Coupe, RN  Safety  Promotion/Fall Prevention: safety round/check completed  Taken 02/19/2021 2200 by Tedra Coupe, RN  Safety Promotion/Fall Prevention: safety round/check completed  Taken 02/19/2021 2100 by Tedra Coupe, RN  Safety Promotion/Fall Prevention: safety round/check completed  Taken 02/19/2021 2000 by Tedra Coupe, RN  Safety Promotion/Fall Prevention:   activity supervised   fall prevention program maintained   motion sensor pad activated   nonskid shoes/slippers when out of bed   safety round/check completed  Intervention: Prevent Skin Injury  Recent Flowsheet Documentation  Taken 02/20/2021 0600 by Tedra Coupe, RN  Body Position:   side lying, right   supine, legs elevated   positioned/repositioned independently   neutral body alignment   neutral head position  Taken 02/20/2021 0400 by Tedra Coupe, RN  Body Position:   sitting   supine, legs elevated   positioned/repositioned independently   neutral head position   neutral body alignment  Taken 02/20/2021 0200 by Tedra Coupe, RN  Body Position:   sitting   supine, legs elevated   positioned/repositioned independently   neutral body alignment   neutral head position  Taken 02/20/2021 0000 by Tedra Coupe, RN  Body Position:   supine, legs elevated   sitting   positioned/repositioned independently   neutral body alignment   neutral head position  Taken 02/19/2021 2200 by Tedra Coupe, RN  Body Position:   supine, legs elevated   positioned/repositioned independently   neutral body alignment   neutral head position   sitting  Taken 02/19/2021 2000 by Tedra Coupe, RN  Body Position:   side lying, left   positioned/repositioned independently   neutral body alignment   neutral head position  Intervention: Prevent and Manage VTE (Venous Thromboembolism) Risk  Recent Flowsheet Documentation  Taken 02/19/2021 2000 by Tedra Coupe, RN  VTE Prevention/Management:   ambulation promoted   anticoagulant therapy maintained   dorsiflexion/plantar flexion performed  Goal: Optimal Comfort and Wellbeing  Outcome: Ongoing (see  interventions/notes)  Intervention: Provide Person-Centered Care  Recent Flowsheet Documentation  Taken 02/19/2021 2000 by Tedra Coupe, RN  Trust Relationship/Rapport:   care explained   choices provided   emotional support provided   empathic listening provided   questions answered   questions encouraged   thoughts/feelings acknowledged  Goal: Rounds/Family Conference  Outcome: Ongoing (see interventions/notes)     Problem: Skin Injury Risk Increased  Goal: Skin Health and Integrity  Outcome: Ongoing (see interventions/notes)  Intervention: Optimize Skin Protection  Recent Flowsheet Documentation  Taken 02/20/2021 0600 by Tedra Coupe, RN  Head of Bed [facility]) Positioning: (Up in chair) other (see comments)  Taken 02/20/2021 0400 by Tedra Coupe, RN  Head of Bed [facility]) Positioning: (Up in chair) other (see comments)  Taken 02/20/2021 0200 by Tedra Coupe, RN  Head of Bed [facility]) Positioning: (Up in chair) other (see comments)  Taken 02/20/2021 0000 by Tedra Coupe, RN  Head of Bed [facility]) Positioning: (Up in chair) other (see comments)  Taken 02/19/2021 2200 by Tedra Coupe, RN  Head of Bed [facility]) Positioning: (Up in chair) other (see comments)  Taken 02/19/2021 2000 by Tedra Coupe, RN  Pressure Reduction Techniques:   (L,M,H,VH)Frequent Turning   frequent weight shift encouraged   pressure points protected   weight shift assistance provided  Pressure Reduction Devices:   (L.M,H,VH) Heel offloading device utilized   (L.M,H,VH) Pressure redistributing mattress utilized   (L.M,H,VH) Specialty Bed utilized   heels elevated off bed  Skin Protection:   adhesive use limited   antimicrobial wipes   electrode sites changed   incontinence pads utilized   pulse oximeter probe site changed   preventative decubiti skin protection foam dressing applied/intact   skin-to-device areas padded   skin-to-skin areas padded   transparent dressing maintained   tubing/devices free from skin contact  Head of Bed (HOB) Positioning: HOB at 20-30 degrees     Problem: Infection  Progression (Sepsis)  Goal: Absence of Infection Signs and Symptoms  Outcome: Ongoing (see interventions/notes)  Intervention: Initiate Sepsis Management  Recent Flowsheet Documentation  Taken 02/20/2021 0400 by Tedra Coupe, RN  Isolation Precautions: contact precautions maintained  Taken 02/20/2021 0000 by Tedra Coupe, RN  Isolation Precautions: contact precautions maintained  Taken 02/19/2021 2000 by Tedra Coupe, RN  Isolation Precautions: contact precautions maintained  Intervention: Promote Stabilization  Recent Flowsheet Documentation  Taken 02/19/2021 2000 by Tedra Coupe, RN  Fever Reduction/Comfort Measures:   lightweight bedding   lightweight clothing  Intervention: Promote Recovery  Recent Flowsheet Documentation  Taken 02/19/2021 2050 by Tedra Coupe, RN  Activity Management:   activity adjusted per tolerance   activity clustered for rest periods   activity encouraged   ambulated in room   up in chair   ROM, active encouraged   dorsiflexion, plantar flexion encouraged  Taken 02/19/2021 2000 by Tedra Coupe, RN  Activity Management:   activity adjusted per tolerance   activity clustered for rest periods   activity encouraged   dorsiflexion, plantar flexion encouraged   ROM, active encouraged     Problem: Fall Injury Risk  Goal: Absence of Fall and Fall-Related Injury  Outcome: Ongoing (see interventions/notes)  Intervention: Identify and Manage Contributors  Recent Flowsheet Documentation  Taken  02/19/2021 2000 by Tedra Coupe, RN  Self-Care Promotion:   independence encouraged   BADL personal objects within reach  Intervention: [place] Documentation  Taken 02/20/2021 0600 by Tedra Coupe, RN  Safety Promotion/Fall Prevention: safety round/check completed  Taken 02/20/2021 0500 by Tedra Coupe, RN  Safety Promotion/Fall Prevention: safety round/check completed  Taken 02/20/2021 0400 by Tedra Coupe, RN  Safety Promotion/Fall Prevention:   activity supervised   fall prevention program maintained   motion sensor  pad activated   nonskid shoes/slippers when out of bed   safety round/check completed  Taken 02/20/2021 0300 by Tedra Coupe, RN  Safety Promotion/Fall Prevention: safety round/check completed  Taken 02/20/2021 0200 by Tedra Coupe, RN  Safety Promotion/Fall Prevention: safety round/check completed  Taken 02/20/2021 0100 by Tedra Coupe, RN  Safety Promotion/Fall Prevention: safety round/check completed  Taken 02/20/2021 0000 by Tedra Coupe, RN  Safety Promotion/Fall Prevention:   activity supervised   fall prevention program maintained   motion sensor pad activated   nonskid shoes/slippers when out of bed   safety round/check completed  Taken 02/19/2021 2300 by Tedra Coupe, RN  Safety Promotion/Fall Prevention: safety round/check completed  Taken 02/19/2021 2200 by Tedra Coupe, RN  Safety Promotion/Fall Prevention: safety round/check completed  Taken 02/19/2021 2100 by Tedra Coupe, RN  Safety Promotion/Fall Prevention: safety round/check completed  Taken 02/19/2021 2000 by Tedra Coupe, RN  Safety Promotion/Fall Prevention:   activity supervised   fall prevention program maintained   motion sensor pad activated   nonskid shoes/slippers when out of bed   safety round/check completed     Problem: Acute Rehab Services Goal & Intervention Plan  Goal: Gait Training Goal  Description: Stand Alone Therapy Goal  Outcome: Ongoing (see interventions/notes)  Goal: Interprofessional Goal  Description: Stand Alone Therapy Goal  Outcome: Ongoing (see interventions/notes)  Goal: Occupational Therapy Goals  Description: Stand Alone Therapy Goal  Outcome: Ongoing (see interventions/notes)  Goal: Physical Therapy Goal  Description: Stand Alone Therapy Goal  Outcome: Ongoing (see interventions/notes)     Problem: Pain Acute  Goal: Acceptable Pain Control and Functional Ability  Outcome: Ongoing (see interventions/notes)  Intervention: Optimize Psychosocial Wellbeing  Recent Flowsheet Documentation  Taken 02/19/2021 2000 by Tedra Coupe, RN  Diversional Activities:  television     Problem: Pain (Cardiovascular Surgery)  Goal: Acceptable Pain Control  Outcome: Ongoing (see interventions/notes)

## 2021-02-20 NOTE — Care Plan (Signed)
[place]  Occupational Therapy Progress Note    Patient Name: [name] [name]  Date of Birth: [birth year]  Height:  165.1 cm ('5\' 5"'$ )  Weight:  67.6 kg (149 lb 0.5 oz)  Room/Bed: 19/A  Payor: [place] MEDICAID / Plan: [employer] [place] MEDICAID / Product Type: Medicaid [place] /     Assessment:    Patient tolerated tx session well this date. Patient progressing well with functional mobility and activity tolerance. Patient is completing basic self care tasks with SBA. Patient is expected to discharge to home with assist when medically stable.      Discharge Needs:   Equipment Recommendation: shower chair      Discharge Disposition:  home with 24/7 assistance     Plan:   Continue to follow patient according to established plan of care.  The risks/benefits of therapy have been discussed with the patient/caregiver and he/she is in agreement with the established plan of care.     Subjective & Objective:      02/20/21 1032   Therapist Pager   OT Assigned/ Pager # [phone]   Rehab Session   Document Type therapy progress note (daily note)   Total OT Minutes: 12   Patient Effort good   General Information   Patient Profile Reviewed yes   Onset of Illness/Injury or Date of Surgery 02/05/21   Medical Lines PIV Line;Telemetry   Respiratory Status room air   Existing Precautions/Restrictions full code;fall precautions   Pre Treatment Status   Pre Treatment Patient Status Patient supine in bed;Call light within reach;Telephone within reach;Sitter select activated   Support Present Pre Treatment  None   Communication Pre Treatment  Nurse   Mutuality/Individual Preferences   Individualized Care Needs OOB with Ax1   Vital Signs   Pre-Treatment Heart Rate (beats/min) 79   Post-treatment Heart Rate (beats/min) 91   Pre Treatment BP 131/83   Post Treatment BP 146/91   Pre SpO2 (%) 97   O2 Delivery Pre Treatment room air   Post SpO2 (%) 100   O2 Delivery Post Treatment room air   Pain Assessment   Additional  Documentation Pain Scale: Numbers Pre/Post-Treatment (Group)   Pretreatment Pain Rating 3/10   Posttreatment Pain Rating 3/10   Pain Scale: Numbers Pre/Post-Treatment   Pain Location - back   Coping/Psychosocial   Observed Emotional State calm;cooperative   Coping/Psychosocial Response Interventions   Plan Of Care Reviewed With patient   Mobility Assessment/Training   Mobility Comment Patient transitioned from supine to sitting EOB with SBA. Patient completed STS with SBA. Patient able to ambulate ~270' with SBA x2. Patient returned to room and transitioned to supine in bed with SBA.   Bed Mobility Assessment/Treatment   Bed Mobility, Assistive Device Head of Bed Elevated   Supine-Sit Independence stand-by assistance   Sit to Supine, Independence stand-by assistance   Transfer Assessment/Treatment   Sit-Stand Independence stand-by assistance   Stand-Sit Independence stand-by assistance   Transfer Impairments endurance   Lower Body Dressing Assessment/Training   Position sitting   DRESSING ASSESSED Don Socks   Impairments motor control impaired   Comment patient able to adjust socks EOB   Balance Skill Training   Sitting Balance: Static good balance   Sitting, Dynamic (Balance) fair + balance   Sit-to-Stand Balance fair + balance   Standing Balance: Static fair + balance   Standing Balance: Dynamic fair balance   Systems Impairment Contributing to Balance Disturbance musculoskeletal   Post  Treatment Status   Post Treatment Patient Status Patient supine in bed;Call light within reach;Telephone within reach;Sitter select activated   Support Present Press photographer Nurse   Clinical Impression   Functional Level at Time of Session Patient tolerated tx session well this date. Patient progressing well with functional mobility and activity tolerance. Patient is completing basic self care tasks with SBA. Patient is expected to discharge to home with assist when medically stable.    Anticipated Discharge Disposition home with 24/7 assistance   Highest level of Mobility score   Exercise/Activity Level Performed 8- Walked 250 feet or more       Therapist:   Trellis Moment, OT  Pager #: [phone]

## 2021-02-20 NOTE — Ancillary Notes (Signed)
Visited pt for ambulation.  Did not complete as pt had just recently ambulated with PT/OT services. Will continue to follow and provide cardiac rehab services as appropriate.     Leonia Corona M.S.  Cardiac Rehab/ Exercise Physiologist   [phone]
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
## 2021-02-20 NOTE — Care Plan (Signed)
[facility]  Physical Therapy Progress Note      Patient Name: [name] [name]  Date of Birth: 1985/07/10  Height:  165.1 cm ('5\' 5"'$ )  Weight:  67.6 kg (149 lb 0.5 oz)  Room/Bed: 19/A  Payor: [place] MEDICAID / Plan: [employer] [place] MEDICAID / Product Type: Medicaid [place] /     Assessment:     [name] [name] is improving with her endurance.  She is able to perform her bed mobility and transfers with SBA, still becomes SOB with ambulation.  Continue to recommend Home with assist when medically ready.    Discharge Needs:   Equipment Recommendation: none anticipated  Discharge Disposition: home with assist  JUSTIFICATION OF DISCHARGE RECOMMENDATION   Based on current diagnosis, functional performance prior to admission, and current functional performance, this patient requires continued PT services in home with assist in order to achieve significant functional improvements in these deficit areas: aerobic capacity/endurance, gait, locomotion, and balance, muscle performance.  Plan:   Continue to follow patient according to established plan of care.  The risks/benefits of therapy have been discussed with the patient/caregiver and he/she is in agreement with the established plan of care.     Subjective & Objective:        02/20/21 1030   Therapist Pager   PT Assigned/ Pager # [phone]   Rehab Session   Document Type therapy progress note (daily note)   Total PT Minutes: 14   Patient Effort good   Symptoms Noted During/After Treatment fatigue;shortness of breath   General Information   Patient Profile Reviewed yes   Medical Lines PIV Line;Telemetry   Respiratory Status room air   Existing Precautions/Restrictions full code;fall precautions;sternal precautions   Mutuality/Individual Preferences   Anxieties, Fears or Concerns none for therapy   Individualized Care Needs up with SBAOOB with 1 assist   Patient-Specific Goals (Include Timeframe) pt will ambulate 250 feet 4 times daily   Plan of Care  Reviewed With patient   Pre Treatment Status   Pre Treatment Patient Status Patient supine in bed;Call light within reach;Telephone within reach;Sitter select activated;Nurse approved session   Support Present Pre Treatment  None   Communication Pre Treatment  Nurse   Communication Pre Treatment Comment says patient has not walked in hallways yet this day   Vital Signs   Pre-Treatment Heart Rate (beats/min) 79   Post-treatment Heart Rate (beats/min) 92   Pre-Treatment Resp Rate (breaths/min) 30   Post-treatment Resp Rate (breaths/min) 33   Pre Treatment BP 131/83 (94)   Post Treatment BP 146/91 (108)   Pre SpO2 (%) 97   O2 Delivery Pre Treatment room air   Post SpO2 (%) 98   O2 Delivery Post Treatment room air   Pain Assessment   Pain Intervention  Medication (see eMAR);PRN Medication;Repositioned;Ambulation/Increased Activity   Pretreatment Pain Rating 2/10   Posttreatment Pain Rating 3/10   Pain Location - Orientation generalized   Pain Location back   Pain Scale: Numbers Pre/Post-Treatment   Pain Location generalized   Pain Location - back   Bed Mobility Assessment/Treatment   Bed Mobility, Assistive Device Head of Bed Elevated   Supine-Sit [place] stand-by assistance   Sit to Supine, [place] stand-by assistance   Impairments endurance   Transfer Assessment/Treatment   Sit-Stand [place] stand-by assistance   Stand-Sit [place] stand-by assistance   Sit-Stand-Sit, Assist Device None   Transfer Impairments balance impaired;endurance   Gait Assessment/Treatment   Total Distance Ambulated 275  [place]  stand-by assistance   Distance in Feet 275   Impairments  balance impaired;endurance   Comment pt SOB with activity, but SpO2 remains [facility]   Balance Skill Training   Sitting Balance: Static good balance   Sitting, Dynamic (Balance) fair + balance   Sit-to-Stand Balance fair + balance   Standing Balance: Static fair + balance   Standing Balance: Dynamic fair balance   Systems Impairment  Contributing to Balance Disturbance musculoskeletal   Identified Impairments Contributing to Balance Disturbance decreased strength   Therapeutic Exercise/Activity   Comment pt educated on the improtance of increasing activity and distance to improve overall health   Post Treatment Status   Post Treatment Patient Status Patient supine in bed;Call light within reach;Telephone within reach;Consulting civil engineer Treatment Comment aware   Plan of Care Review   Plan Of Care Reviewed With patient   Basic Mobility Am-PAC/6Clicks Score (APPROVED PT Staff, [facility] PT/OT and [facility] Nursing ONLY)   Turning in bed without bedrails 4   Lying on back to sitting on edge of flat bed 4   Moving to and from a bed to a chair 4   Standing up from chair 4   Walk in room 4   Climbing 3-5 steps with railing 3   6 Clicks Raw Score total 23   Standardized (t-scale) score 50.88   CMS 0-100% Score 16.55   CMS Modifier CI   Patient Mobility Goal (JHHLM) 7- Walk 25 feet or more 3X/day   Exercise/Activity Level Performed 8- Walked 250 feet or more   Functional Impairment   Overall Functional Impairments/Problem List endurance   Physical Therapy Clinical Impression   Assessment [name] [name] is improving with her endurance.  She is able to perform her bed mobility and transfers with SBA, still becomes SOB with ambulation.  Continue to recommend Home with assist when medically ready.   Anticipated Equipment Needs at Discharge (PT) none anticipated   Anticipated Discharge Disposition home with assist       Therapist:   Gypsy Lore, PT   Pager #: [phone]

## 2021-02-21 ENCOUNTER — Inpatient Hospital Stay (HOSPITAL_COMMUNITY): Payer: Medicaid Other

## 2021-02-21 DIAGNOSIS — M310 Hypersensitivity angiitis: Secondary | ICD-10-CM

## 2021-02-21 DIAGNOSIS — F19139 Other psychoactive substance abuse with withdrawal, unspecified (CMS HCC): Secondary | ICD-10-CM

## 2021-02-21 DIAGNOSIS — R918 Other nonspecific abnormal finding of lung field: Secondary | ICD-10-CM

## 2021-02-21 DIAGNOSIS — I517 Cardiomegaly: Secondary | ICD-10-CM

## 2021-02-21 DIAGNOSIS — J90 Pleural effusion, not elsewhere classified: Secondary | ICD-10-CM

## 2021-02-21 LAB — CROSSMATCH RED CELLS - UNITS

## 2021-02-21 LAB — BASIC METABOLIC PANEL
ANION GAP: 11 mmol/L (ref 4–13)
BUN/CREA RATIO: 35 — ABNORMAL HIGH (ref 6–22)
BUN: 56 mg/dL — ABNORMAL HIGH (ref 8–25)
CALCIUM: 7.9 mg/dL — ABNORMAL LOW (ref 8.5–10.0)
CHLORIDE: 98 mmol/L (ref 96–111)
CO2 TOTAL: 18 mmol/L — ABNORMAL LOW (ref 22–30)
CREATININE: 1.6 mg/dL — ABNORMAL HIGH (ref 0.60–1.05)
ESTIMATED GFR: 43 mL/min/BSA — ABNORMAL LOW (ref >=60)
GLUCOSE: 105 mg/dL (ref 65–125)
POTASSIUM: 3.9 mmol/L (ref 3.5–5.1)
SODIUM: 127 mmol/L — ABNORMAL LOW (ref 136–145)

## 2021-02-21 LAB — MANUAL DIFF AND MORPHOLOGY-SYSMEX
BASOPHIL #: <0.1 10*3/uL (ref <=0.20)
BASOPHIL %: 0 %
EOSINOPHIL #: <0.1 10*3/uL (ref <=0.50)
EOSINOPHIL %: 1 %
LYMPHOCYTE #: 0.88 10*3/uL — ABNORMAL LOW (ref 1.00–4.80)
LYMPHOCYTE %: 10 %
METAMYELOCYTE %: 1 %
MONOCYTE #: 0.35 10*3/uL (ref 0.20–1.10)
MONOCYTE %: 4 %
MYELOCYTE %: 1 %
NEUTROPHIL #: 7.3 10*3/uL (ref 1.50–7.70)
NEUTROPHIL %: 82 %
NEUTROPHIL BANDS %: 1 %
RBC MORPHOLOGY: NORMAL

## 2021-02-21 LAB — RETICULOCYTE COUNT
IMMATURE RETIC FRACTION: 22.4 % — ABNORMAL HIGH (ref 2.7–15.9)
RETICULOCYTE % AUTOMATED: 2.43 % — ABNORMAL HIGH (ref 0.50–2.20)
RETICULOCYTE HEMOGLOBIN EQUIVALENT: 30.3 pg (ref 28.0–38.0)
RETICULOCYTES COUNT # AUTOMATED: 57.3 10*3/uL (ref 17.0–100.0)

## 2021-02-21 LAB — H & H
HCT: 19.3 % — ABNORMAL LOW (ref 34.8–46.0)
HGB: 6.4 g/dL — CL (ref 11.5–16.0)

## 2021-02-21 LAB — CBC WITH DIFF
HCT: 20.8 % — ABNORMAL LOW (ref 34.8–46.0)
HGB: 6.9 g/dL — CL (ref 11.5–16.0)
MCH: 27.1 pg (ref 26.0–32.0)
MCHC: 33.2 g/dL (ref 31.0–35.5)
MCV: 81.6 fL (ref 78.0–100.0)
MPV: 8.5 fL — ABNORMAL LOW (ref 8.7–12.5)
PLATELETS: 372 10*3/uL (ref 150–400)
RBC: 2.55 10*6/uL — ABNORMAL LOW (ref 3.85–5.22)
RDW-CV: 19.5 % — ABNORMAL HIGH (ref 11.5–15.5)
WBC: 8.8 10*3/uL (ref 3.7–11.0)

## 2021-02-21 LAB — IRON TRANSFERRIN AND TIBC
IRON (TRANSFERRIN) SATURATION: 12 % — ABNORMAL LOW (ref 15–50)
IRON: 14 ug/dL — ABNORMAL LOW (ref 45–170)
TOTAL IRON BINDING CAPACITY: 118 ug/dL — ABNORMAL LOW (ref 252–504)
TRANSFERRIN: 84 mg/dL — ABNORMAL LOW (ref 180–360)

## 2021-02-21 LAB — SURGICAL PATHOLOGY SPECIMEN

## 2021-02-21 LAB — PHOSPHORUS: PHOSPHORUS: 6.7 mg/dL — ABNORMAL HIGH (ref 2.4–4.7)

## 2021-02-21 LAB — ADULT ROUTINE BLOOD CULTURE, SET OF 2 BOTTLES (BACTERIA AND YEAST)
BLOOD CULTURE, ROUTINE: NO GROWTH
BLOOD CULTURE, ROUTINE: NO GROWTH

## 2021-02-21 LAB — PT/INR
INR: 3.88 — ABNORMAL HIGH (ref 0.80–1.20)
PROTHROMBIN TIME: 45.9 seconds — ABNORMAL HIGH (ref 9.1–13.9)

## 2021-02-21 LAB — MAGNESIUM: MAGNESIUM: 2 mg/dL (ref 1.8–2.6)

## 2021-02-21 LAB — FERRITIN: FERRITIN: 484 ng/mL — ABNORMAL HIGH (ref 5–200)

## 2021-02-21 MED ORDER — SODIUM CHLORIDE 0.9 % (FLUSH) INJECTION SYRINGE
10.0000 mL | INJECTION | Freq: Three times a day (TID) | INTRAMUSCULAR | Status: DC
Start: 2021-02-21 — End: 2021-03-02
  Administered 2021-02-21: 10 mL
  Administered 2021-02-21: 0 mL
  Administered 2021-02-22: 20 mL
  Administered 2021-02-22 (×2): 0 mL
  Administered 2021-02-23 (×2): 10 mL
  Administered 2021-02-23 – 2021-02-24 (×2): 20 mL
  Administered 2021-02-24: 10 mL
  Administered 2021-02-24: 30 mL
  Administered 2021-02-25: 10 mL
  Administered 2021-02-25: 20 mL
  Administered 2021-02-25: 0 mL
  Administered 2021-02-26 (×3): 10 mL
  Administered 2021-02-27: 20 mL
  Administered 2021-02-27 – 2021-02-28 (×3): 10 mL
  Administered 2021-02-28: 20 mL
  Administered 2021-02-28 – 2021-03-02 (×5): 10 mL

## 2021-02-21 MED ORDER — CALCIUM 200 MG (AS CALCIUM CARBONATE 500 MG) CHEWABLE TABLET
500.0000 mg | CHEWABLE_TABLET | Freq: Three times a day (TID) | ORAL | Status: DC | PRN
Start: 2021-02-21 — End: 2021-03-02
  Administered 2021-02-21 – 2021-02-27 (×6): 500 mg via ORAL
  Filled 2021-02-21 (×7): qty 1

## 2021-02-21 MED ORDER — MELATONIN 3 MG TABLET
6.0000 mg | ORAL_TABLET | Freq: Every evening | ORAL | Status: DC
Start: 2021-02-21 — End: 2021-03-02
  Administered 2021-02-21 – 2021-03-01 (×9): 6 mg via ORAL
  Filled 2021-02-21 (×9): qty 2

## 2021-02-21 MED ORDER — SODIUM CHLORIDE 0.9 % IV BOLUS
40.0000 mL | INJECTION | Freq: Once | Status: AC | PRN
Start: 2021-02-21 — End: 2021-02-21

## 2021-02-21 MED ORDER — SODIUM FERRIC GLUCONATE COMPLEX IN SUCROSE 62.5 MG/5 ML INTRAVENOUS
250.0000 mg | INTRAVENOUS | Status: AC
Start: 2021-02-21 — End: 2021-02-27
  Administered 2021-02-21: 0 mg via INTRAVENOUS
  Administered 2021-02-21 – 2021-02-23 (×2): 250 mg via INTRAVENOUS
  Administered 2021-02-23 – 2021-02-25 (×2): 0 mg via INTRAVENOUS
  Administered 2021-02-25 – 2021-02-27 (×2): 250 mg via INTRAVENOUS
  Administered 2021-02-27: 0 mg via INTRAVENOUS
  Filled 2021-02-21 (×4): qty 20

## 2021-02-21 MED ORDER — LIDOCAINE (PF) 10 MG/ML (1 %) INJECTION SOLUTION
0.5000 mL | Freq: Once | INTRAMUSCULAR | Status: AC
Start: 2021-02-21 — End: 2021-02-21
  Administered 2021-02-21 (×2): 0.5 mL via INTRADERMAL

## 2021-02-21 MED ORDER — PHENOL 1.4 % MUCOSAL AEROSOL SPRAY
1.0000 | INHALATION_SPRAY | Status: DC | PRN
Start: 2021-02-21 — End: 2021-02-26
  Filled 2021-02-21: qty 177

## 2021-02-21 MED ORDER — QUETIAPINE 100 MG TABLET
100.0000 mg | ORAL_TABLET | Freq: Every evening | ORAL | Status: DC
Start: 2021-02-21 — End: 2021-02-28
  Administered 2021-02-21 – 2021-02-27 (×7): 100 mg via ORAL
  Filled 2021-02-21 (×7): qty 1

## 2021-02-21 MED ORDER — SODIUM CHLORIDE 0.9 % (FLUSH) INJECTION SYRINGE
20.0000 mL | INJECTION | INTRAMUSCULAR | Status: DC | PRN
Start: 2021-02-21 — End: 2021-03-02

## 2021-02-21 NOTE — Care Plan (Addendum)
Pt s/p overdose, TVR. GCS 4/6/5. Pt fluctuates between restless and agitated/angry. Complains of 7/10 back pain and has received '10mg'$  Roxi x 2, as well as '650mg'$  Tylenol. Pt claims she hasn't "slept in three weeks." Pt's mother called twice to ask [place] to give her something to help her sleep. I explained that I had given her Seroquel, melatonin, and pain medication and that, under normal circumstances, that would make a pt go to sleep. Mother stated, "She's not normal, though." Mother also requested that provider call her during the day on 9/7 to discuss giving her daughter something for sleep, as well as starting pt on antidepressants. I reassured mother I would pass along the message and notified PA Remo Lipps. Pt was in and out of bed most of the shift, frequently crying out, moaning, and seeking attention of personnel.    Patient Goals Based on Modifiable Risk Factors    The following modifiable risk factors were discussed with patient:     Substance Use    Goal(s):  Stop utilizing herion, cocaine, and/or other substances    Patient's selected lifestyle modification(s):  Utilize a comprehensive management plan to cease use of substances      Patient/Family/Caregiver response to plan:    Patient stated "I'm not going to use".      Problem: Adult Inpatient Plan of Care  Goal: Plan of Care Review  02/21/2021 0520 by Kathlen Brunswick, RN  Outcome: Ongoing (see interventions/notes)  02/21/2021 0423 by Kathlen Brunswick, RN  Outcome: Ongoing (see interventions/notes)  Goal: Patient-Specific Goal (Individualized)  02/21/2021 0520 by Kathlen Brunswick, RN  Outcome: Ongoing (see interventions/notes)  02/21/2021 0423 by Kathlen Brunswick, RN  Flowsheets (Taken 02/21/2021 0422)  Individualized Care Needs: '10mg'$  Oxy x 2 for "severe" back pain (7/10)  Anxieties, Fears or Concerns: Anxious because she says she hasn't "slept in three weeks"  Patient-Specific Goals (Include Timeframe): Pt will be made SDS 02/21/21  Goal: Absence of Hospital-Acquired Illness or  Injury  02/21/2021 0520 by Kathlen Brunswick, RN  Outcome: Ongoing (see interventions/notes)  02/21/2021 0423 by Kathlen Brunswick, RN  Outcome: Ongoing (see interventions/notes)  Intervention: Identify and Manage Fall Risk  Recent Flowsheet Documentation  Taken 02/21/2021 0400 by Kathlen Brunswick, RN  Safety Promotion/Fall Prevention: safety round/check completed  Taken 02/21/2021 0300 by Kathlen Brunswick, RN  Safety Promotion/Fall Prevention: safety round/check completed  Taken 02/21/2021 0200 by Kathlen Brunswick, RN  Safety Promotion/Fall Prevention: safety round/check completed  Taken 02/21/2021 0100 by Kathlen Brunswick, RN  Safety Promotion/Fall Prevention: safety round/check completed  Taken 02/20/2021 2300 by Kathlen Brunswick, RN  Safety Promotion/Fall Prevention: safety round/check completed  Taken 02/20/2021 2200 by Kathlen Brunswick, RN  Safety Promotion/Fall Prevention: safety round/check completed  Taken 02/20/2021 2100 by Kathlen Brunswick, RN  Safety Promotion/Fall Prevention: safety round/check completed  Taken 02/20/2021 2000 by Kathlen Brunswick, RN  Safety Promotion/Fall Prevention: safety round/check completed  Taken 02/20/2021 1900 by Kathlen Brunswick, RN  Safety Promotion/Fall Prevention: safety round/check completed  Taken 02/20/2021 1800 by Kathlen Brunswick, RN  Safety Promotion/Fall Prevention: safety round/check completed  Taken 02/20/2021 1700 by Kathlen Brunswick, RN  Safety Promotion/Fall Prevention: safety round/check completed  Taken 02/20/2021 1600 by Kathlen Brunswick, RN  Safety Promotion/Fall Prevention:  . activity supervised  . fall prevention program maintained  . motion sensor pad activated  . nonskid shoes/slippers when out of bed  . safety round/check completed  Intervention: Prevent Skin Injury  Recent Flowsheet Documentation  Taken 02/21/2021 0400 by Kathlen Brunswick, RN  Body Position: positioned/repositioned  independently  Taken 02/21/2021 0200 by Kathlen Brunswick, RN  Body Position: positioned/repositioned independently  Taken 02/21/2021 0000 by Kathlen Brunswick, RN  Body  Position: positioned/repositioned independently  Taken 02/20/2021 2200 by Kathlen Brunswick, RN  Body Position: positioned/repositioned independently  Taken 02/20/2021 2000 by Kathlen Brunswick, RN  Body Position: positioned/repositioned independently  Taken 02/20/2021 1800 by Kathlen Brunswick, RN  Body Position: positioned/repositioned independently  Taken 02/20/2021 1600 by Kathlen Brunswick, RN  Body Position: positioned/repositioned independently  Intervention: Prevent and Manage VTE (Venous Thromboembolism) Risk  Recent Flowsheet Documentation  Taken 02/20/2021 1600 by Kathlen Brunswick, RN  VTE Prevention/Management: ambulation promoted  Goal: Optimal Comfort and Wellbeing  02/21/2021 0520 by Kathlen Brunswick, RN  Outcome: Ongoing (see interventions/notes)  02/21/2021 0423 by Kathlen Brunswick, RN  Outcome: Ongoing (see interventions/notes)  Intervention: Provide Person-Centered Care  Recent Flowsheet Documentation  Taken 02/20/2021 1600 by Kathlen Brunswick, RN  Trust Relationship/Rapport:  . care explained  . choices provided  . emotional support provided  . empathic listening provided  . questions answered  . questions encouraged  . reassurance provided  . thoughts/feelings acknowledged  Goal: Rounds/Family Conference  02/21/2021 0520 by Kathlen Brunswick, RN  Outcome: Ongoing (see interventions/notes)  02/21/2021 0423 by Kathlen Brunswick, RN  Outcome: Ongoing (see interventions/notes)     Problem: Skin Injury Risk Increased  Goal: Skin Health and Integrity  02/21/2021 0520 by Kathlen Brunswick, RN  Outcome: Ongoing (see interventions/notes)  02/21/2021 0423 by Kathlen Brunswick, RN  Outcome: Ongoing (see interventions/notes)  Intervention: Optimize Skin Protection  Recent Flowsheet Documentation  Taken 02/21/2021 0400 by Kathlen Brunswick, RN  Head of Bed [facility]) Positioning: HOB at 20-30 degrees  Taken 02/21/2021 0200 by Kathlen Brunswick, RN  Head of Bed [facility]) Positioning: HOB at 20-30 degrees  Taken 02/21/2021 0000 by Kathlen Brunswick, RN  Head of Bed [facility]) Positioning: HOB at 20-30 degrees  Taken  02/20/2021 2200 by Kathlen Brunswick, RN  Head of Bed [facility]) Positioning: HOB at 20-30 degrees  Taken 02/20/2021 2000 by Kathlen Brunswick, RN  Head of Bed [facility]) Positioning: HOB at 20-30 degrees  Taken 02/20/2021 1800 by Kathlen Brunswick, RN  Head of Bed [facility]) Positioning: HOB at 20-30 degrees  Taken 02/20/2021 1600 by Kathlen Brunswick, RN  Pressure Reduction Techniques: frequent weight shift encouraged  Pressure Reduction Devices: (M,H,VH) Use Repositioning Devices or Pillows  Skin Protection:  . adhesive use limited  . incontinence pads utilized  . preventative decubiti skin protection foam dressing applied/intact  . transparent dressing maintained  . tubing/devices free from skin contact  Head of Bed (HOB) Positioning: HOB at 20-30 degrees     Problem: Inability to Wean (Mechanical Ventilation, Invasive)  Goal: Mechanical Ventilation Liberation  Intervention: Promote Extubation and Dentist  Recent Flowsheet Documentation  Taken 02/20/2021 1600 by Kathlen Brunswick, RN  Sleep/Rest Enhancement:  . awakenings minimized  . consistent schedule promoted  . family presence promoted  . natural light exposure provided  . noise level reduced  . regular sleep/rest pattern promoted  . relaxation techniques promoted  . room darkened  . therapeutic touch utilized     Problem: Ventilator-Induced Lung Injury (Mechanical Ventilation, Invasive)  Goal: Absence of Ventilator-Induced Lung Injury  Intervention: Prevent Ventilator-Associated Pneumonia  Recent Flowsheet Documentation  Taken 02/21/2021 0400 by Kathlen Brunswick, RN  Head of Bed [facility]) Positioning: HOB at 20-30 degrees  Taken 02/21/2021 0200 by Kathlen Brunswick, RN  Head of Bed [facility]) Positioning: HOB at 20-30 degrees  Taken 02/21/2021 0000 by Kathlen Brunswick, RN  Head of Bed [facility]) Positioning: HOB at 20-30 degrees  Taken 02/20/2021 2200 by Kathlen Brunswick, RN  Head of Bed [facility]) Positioning: HOB at 20-30 degrees  Taken 02/20/2021 2000 by Kathlen Brunswick, RN  Head of Bed [facility]) Positioning: HOB at 20-30  degrees  Taken 02/20/2021 1800 by Kathlen Brunswick, RN  Head of Bed [facility]) Positioning: HOB at 20-30 degrees  Taken 02/20/2021 1600 by Kathlen Brunswick, RN  Head of Bed [facility]) Positioning: HOB at 20-30 degrees     Problem: Infection Progression (Sepsis)  Goal: Absence of Infection Signs and Symptoms  Intervention: Initiate Sepsis Management  Recent Flowsheet Documentation  Taken 02/20/2021 1600 by Kathlen Brunswick, RN  Isolation Precautions: contact precautions maintained  Intervention: Promote Stabilization  Recent Flowsheet Documentation  Taken 02/20/2021 1600 by Kathlen Brunswick, RN  Fever Reduction/Comfort Measures:  . lightweight bedding  . lightweight clothing  Intervention: Promote Recovery  Recent Flowsheet Documentation  Taken 02/20/2021 1600 by Kathlen Brunswick, RN  Sleep/Rest Enhancement:  . awakenings minimized  . consistent schedule promoted  . family presence promoted  . natural light exposure provided  . noise level reduced  . regular sleep/rest pattern promoted  . relaxation techniques promoted  . room darkened  . therapeutic touch utilized  Activity Management:  . activity adjusted per tolerance  . activity clustered for rest periods  . activity encouraged  . ambulated in room  . dorsiflexion, plantar flexion encouraged  . ROM, active encouraged     Problem: Fall Injury Risk  Goal: Absence of Fall and Fall-Related Injury  02/21/2021 0520 by Kathlen Brunswick, RN  Outcome: Ongoing (see interventions/notes)  02/21/2021 0423 by Kathlen Brunswick, RN  Outcome: Ongoing (see interventions/notes)  Intervention: Identify and Manage Contributors  Recent Flowsheet Documentation  Taken 02/20/2021 1600 by Kathlen Brunswick, RN  Self-Care Promotion: independence encouraged  Intervention: Promote Injury-Free Environment  Recent Flowsheet Documentation  Taken 02/21/2021 0400 by Kathlen Brunswick, RN  Safety Promotion/Fall Prevention: safety round/check completed  Taken 02/21/2021 0300 by Kathlen Brunswick, RN  Safety Promotion/Fall Prevention: safety round/check completed  Taken  02/21/2021 0200 by Kathlen Brunswick, RN  Safety Promotion/Fall Prevention: safety round/check completed  Taken 02/21/2021 0100 by Kathlen Brunswick, RN  Safety Promotion/Fall Prevention: safety round/check completed  Taken 02/20/2021 2300 by Kathlen Brunswick, RN  Safety Promotion/Fall Prevention: safety round/check completed  Taken 02/20/2021 2200 by Kathlen Brunswick, RN  Safety Promotion/Fall Prevention: safety round/check completed  Taken 02/20/2021 2100 by Kathlen Brunswick, RN  Safety Promotion/Fall Prevention: safety round/check completed  Taken 02/20/2021 2000 by Kathlen Brunswick, RN  Safety Promotion/Fall Prevention: safety round/check completed  Taken 02/20/2021 1900 by Kathlen Brunswick, RN  Safety Promotion/Fall Prevention: safety round/check completed  Taken 02/20/2021 1800 by Kathlen Brunswick, RN  Safety Promotion/Fall Prevention: safety round/check completed  Taken 02/20/2021 1700 by Kathlen Brunswick, RN  Safety Promotion/Fall Prevention: safety round/check completed  Taken 02/20/2021 1600 by Kathlen Brunswick, RN  Safety Promotion/Fall Prevention:  . activity supervised  . fall prevention program maintained  . motion sensor pad activated  . nonskid shoes/slippers when out of bed  . safety round/check completed     Problem: Acute Rehab Services Goal & Intervention Plan  Goal: Gait Training Goal  Description: Stand Alone Therapy Goal  02/21/2021 0520 by Kathlen Brunswick, RN  Outcome: Ongoing (see interventions/notes)  02/21/2021 0423 by Kathlen Brunswick, RN  Outcome: Ongoing (see interventions/notes)  Goal: Interprofessional Goal  Description: Stand Alone Therapy Goal  02/21/2021 0520 by Kathlen Brunswick, RN  Outcome: Ongoing (see interventions/notes)  02/21/2021 0423  by Kathlen Brunswick, RN  Outcome: Ongoing (see interventions/notes)  Goal: Occupational Therapy Goals  Description: Stand Alone Therapy Goal  02/21/2021 0520 by Kathlen Brunswick, RN  Outcome: Ongoing (see interventions/notes)  02/21/2021 0423 by Kathlen Brunswick, RN  Outcome: Ongoing (see interventions/notes)  Goal: Physical Therapy  Goal  Description: Stand Alone Therapy Goal  02/21/2021 0520 by Kathlen Brunswick, RN  Outcome: Ongoing (see interventions/notes)  02/21/2021 0423 by Kathlen Brunswick, RN  Outcome: Ongoing (see interventions/notes)     Problem: Pain Acute  Goal: Acceptable Pain Control and Functional Ability  02/21/2021 0520 by Kathlen Brunswick, RN  Outcome: Ongoing (see interventions/notes)  02/21/2021 0423 by Kathlen Brunswick, RN  Outcome: Ongoing (see interventions/notes)  Intervention: Prevent or Manage Pain  Recent Flowsheet Documentation  Taken 02/20/2021 1600 by Kathlen Brunswick, RN  Sleep/Rest Enhancement:  . awakenings minimized  . consistent schedule promoted  . family presence promoted  . natural light exposure provided  . noise level reduced  . regular sleep/rest pattern promoted  . relaxation techniques promoted  . room darkened  . therapeutic touch utilized  Intervention: Optimize Psychosocial Wellbeing  Recent Flowsheet Documentation  Taken 02/20/2021 1600 by Kathlen Brunswick, RN  Spiritual Activities Assistance:  . hope instilled  . personal rituals encouraged  Diversional Activities:  . smartphone  . television  Supportive Measures:  . active listening utilized  . positive reinforcement provided  . self-care encouraged  . self-responsibility promoted  . verbalization of feelings encouraged       Kathlen Brunswick, RN  02/21/2021, 05:20

## 2021-02-21 NOTE — Ancillary Notes (Signed)
Visited pt for ambulation, did not complete as pt is having bedside procedure done at this time. Will continue to follow and provide cardiac rehab services when appropriate.     Leonia Corona M.S.  Cardiac Rehab/ Exercise Physiologist   [phone]

## 2021-02-21 NOTE — Care Plan (Signed)
Pt s/p TVR on 8/30. Her midline was switched out for a PICC today and she was made SDS. Pt has been OOB all day and has walked x2. She was reminded about high fall risk precautions as well as call light operation. VS have been stable with some mild hypertension with SBP's in the 120-140's and DBP's in the 70-90's. Will continue to monitor.     BP (!) 146/92   Pulse 86   Temp 36.8 C (98.2 F)   Resp (!) 28   Ht 1.651 m ('5\' 5"'$ )   Wt 67.6 kg (149 lb 0.5 oz)   SpO2 97%   BMI 24.80 kg/m     Francisco Capuchin, RN  02/21/2021, 16:36    Problem: Adult Inpatient Plan of Care  Goal: Plan of Care Review  Outcome: Ongoing (see interventions/notes)  Goal: Patient-Specific Goal (Individualized)  Outcome: Ongoing (see interventions/notes)  Goal: Absence of Hospital-Acquired Illness or Injury  Outcome: Ongoing (see interventions/notes)  Intervention: Identify and Manage Fall Risk  Recent Flowsheet Documentation  Taken 02/21/2021 1900 by Francisco Capuchin, RN  Safety Promotion/Fall Prevention: safety round/check completed  Taken 02/21/2021 1800 by Francisco Capuchin, RN  Safety Promotion/Fall Prevention: safety round/check completed  Taken 02/21/2021 1700 by Francisco Capuchin, RN  Safety Promotion/Fall Prevention: safety round/check completed  Taken 02/21/2021 1600 by Francisco Capuchin, RN  Safety Promotion/Fall Prevention: safety round/check completed  Taken 02/21/2021 1500 by Francisco Capuchin, RN  Safety Promotion/Fall Prevention: safety round/check completed  Taken 02/21/2021 1400 by Francisco Capuchin, RN  Safety Promotion/Fall Prevention: safety round/check completed  Taken 02/21/2021 1300 by Francisco Capuchin, RN  Safety Promotion/Fall Prevention: safety round/check completed  Taken 02/21/2021 1200 by Francisco Capuchin, RN  Safety Promotion/Fall Prevention: safety round/check completed  Taken 02/21/2021 1100 by Francisco Capuchin, RN  Safety Promotion/Fall Prevention: safety round/check completed  Taken 02/21/2021 1000 by Francisco Capuchin, RN  Safety  Promotion/Fall Prevention: safety round/check completed  Taken 02/21/2021 0900 by Francisco Capuchin, RN  Safety Promotion/Fall Prevention: safety round/check completed  Taken 02/21/2021 0800 by Francisco Capuchin, RN  Safety Promotion/Fall Prevention: safety round/check completed  Intervention: Prevent Skin Injury  Recent Flowsheet Documentation  Taken 02/21/2021 1605 by Francisco Capuchin, RN  Body Position:   sitting   positioned/repositioned independently  Taken 02/21/2021 1230 by Francisco Capuchin, RN  Body Position: positioned/repositioned independently  Taken 02/21/2021 1000 by Francisco Capuchin, RN  Body Position: positioned/repositioned independently  Taken 02/21/2021 0800 by Francisco Capuchin, RN  Body Position: positioned/repositioned independently  Intervention: Prevent and Manage VTE (Venous Thromboembolism) Risk  Recent Flowsheet Documentation  Taken 02/21/2021 0800 by Francisco Capuchin, RN  VTE Prevention/Management: ambulation promoted  Goal: Optimal Comfort and Wellbeing  Outcome: Ongoing (see interventions/notes)  Intervention: Provide Person-Centered Care  Recent Flowsheet Documentation  Taken 02/21/2021 0800 by Francisco Capuchin, RN  Trust Relationship/Rapport:   care explained   choices provided   emotional support provided   empathic listening provided   questions answered   questions encouraged   reassurance provided   thoughts/feelings acknowledged  Goal: Rounds/Family Conference  Outcome: Ongoing (see interventions/notes)     Problem: Skin Injury Risk Increased  Goal: Skin Health and Integrity  Outcome: Ongoing (see interventions/notes)  Intervention: Optimize Skin Protection  Recent Flowsheet Documentation  Taken 02/21/2021 1605 by Francisco Capuchin, RN  Head of Bed [facility]) Positioning: (sitting 90 degrees) other (see comments)  Taken 02/21/2021 1230 by Francisco Capuchin, RN  Head of Bed [facility]) Positioning: (chair mode) other (see comments)  Taken 02/21/2021 1000 by Francisco Capuchin, RN  Head of Bed [facility]) Positioning: (sitting  on edge) other (see comments)  Taken 02/21/2021 0800 by Francisco Capuchin, RN  Pressure Reduction Techniques:   (L,M,H,VH) Maximal Remobilization   (L,M,H,VH) Manage Moisture, Nutrition & Shear   (M,H,VH) 30 degree Lateral Positioning   (H,VH) Increase Frequency of Turning  Pressure Reduction Devices:   (L.M,H,VH) Heel offloading device utilized   (L.M,H,VH) Pressure redistributing mattress utilized   (L.M,H,VH) Specialty Bed utilized   (L.M,H,VH) Pressure Redistributing Chair cushion utilized   (M,H,VH) Use Repositioning Devices or Pillows  Skin Protection:   adhesive use limited   tubing/devices free from skin contact   transparent dressing maintained   skin-to-skin areas padded   skin-to-device areas padded   skin sealant/moisture barrier applied   preventative decubiti skin protection foam dressing applied/intact  Head of Bed (HOB) Positioning: HOB at 30-45 degrees     Problem: Infection Progression (Sepsis)  Goal: Absence of Infection Signs and Symptoms  Outcome: Ongoing (see interventions/notes)  Intervention: Initiate Sepsis Management  Recent Flowsheet Documentation  Taken 02/21/2021 0800 by Francisco Capuchin, RN  Isolation Precautions: contact precautions maintained  Intervention: Promote Stabilization  Recent Flowsheet Documentation  Taken 02/21/2021 0800 by Francisco Capuchin, RN  Fever Reduction/Comfort Measures:   lightweight bedding   lightweight clothing  Intervention: Promote Recovery  Recent Flowsheet Documentation  Taken 02/21/2021 1605 by Francisco Capuchin, RN  Activity Management:   activity adjusted per tolerance   activity clustered for rest periods   up in chair  Taken 02/21/2021 1600 by Francisco Capuchin, RN  Activity Management:   activity adjusted per tolerance   activity clustered for rest periods   ambulated in hall  Taken 02/21/2021 1230 by Francisco Capuchin, RN  Activity Management: returned to bed  Taken 02/21/2021 1200 by Francisco Capuchin, RN  Activity Management:   activity adjusted per tolerance    activity clustered for rest periods   ambulated to bathroom  Taken 02/21/2021 1000 by Francisco Capuchin, RN  Activity Management: returned to bed  Taken 02/21/2021 0954 by Francisco Capuchin, RN  Activity Management:   activity adjusted per tolerance   activity clustered for rest periods   ambulated to bathroom  Taken 02/21/2021 0800 by Francisco Capuchin, RN  Activity Management:   activity adjusted per tolerance   activity clustered for rest periods     Problem: Fall Injury Risk  Goal: Absence of Fall and Fall-Related Injury  Outcome: Ongoing (see interventions/notes)  Intervention: Promote Injury-Free Environment  Recent Flowsheet Documentation  Taken 02/21/2021 1900 by Francisco Capuchin, RN  Safety Promotion/Fall Prevention: safety round/check completed  Taken 02/21/2021 1800 by Francisco Capuchin, RN  Safety Promotion/Fall Prevention: safety round/check completed  Taken 02/21/2021 1700 by Francisco Capuchin, RN  Safety Promotion/Fall Prevention: safety round/check completed  Taken 02/21/2021 1600 by Francisco Capuchin, RN  Safety Promotion/Fall Prevention: safety round/check completed  Taken 02/21/2021 1500 by Francisco Capuchin, RN  Safety Promotion/Fall Prevention: safety round/check completed  Taken 02/21/2021 1400 by Francisco Capuchin, RN  Safety Promotion/Fall Prevention: safety round/check completed  Taken 02/21/2021 1300 by Francisco Capuchin, RN  Safety Promotion/Fall Prevention: safety round/check completed  Taken 02/21/2021 1200 by Francisco Capuchin, RN  Safety Promotion/Fall Prevention: safety round/check completed  Taken 02/21/2021 1100 by Francisco Capuchin, RN  Safety Promotion/Fall Prevention: safety round/check completed  Taken 02/21/2021 1000 by Francisco Capuchin, RN  Safety Promotion/Fall Prevention: safety round/check completed  Taken 02/21/2021 0900 by Francisco Capuchin, RN  Safety Promotion/Fall Prevention: safety round/check completed  Taken 02/21/2021 0800 by Francisco Capuchin, RN  Safety  Promotion/Fall Prevention: safety round/check  completed     Problem: Acute Rehab Services Goal & Intervention Plan  Goal: Gait Training Goal  Description: Stand Alone Therapy Goal  Outcome: Ongoing (see interventions/notes)  Goal: Interprofessional Goal  Description: Stand Alone Therapy Goal  Outcome: Ongoing (see interventions/notes)  Goal: Occupational Therapy Goals  Description: Stand Alone Therapy Goal  Outcome: Ongoing (see interventions/notes)  Goal: Physical Therapy Goal  Description: Stand Alone Therapy Goal  Outcome: Ongoing (see interventions/notes)     Problem: Pain Acute  Goal: Acceptable Pain Control and Functional Ability  Outcome: Ongoing (see interventions/notes)  Intervention: Optimize Psychosocial Wellbeing  Recent Flowsheet Documentation  Taken 02/21/2021 0800 by Francisco Capuchin, RN  Diversional Activities:   smartphone   television     Problem: Pain (Cardiovascular Surgery)  Goal: Acceptable Pain Control  Outcome: Ongoing (see interventions/notes)     Problem: Behavioral Health Comorbidity  Goal: Maintenance of Behavioral Health Symptom Control  Outcome: Ongoing (see interventions/notes)

## 2021-02-21 NOTE — Progress Notes (Signed)
[facility]  HVI Critical Care Consult/Progress Note     Jenna, Routzahn, 35 y.o. female  Date of Birth: 1986-02-07  Medical Record Number:  [id number]   Inpatient Admission Date: 02/05/2021   Hospital Day:  LOS: 0 days      Chief Complaint: Altered Mental Status   History of Present Illness: Per previous providers: "Pt is a 35 yo female with a significant PMH for Smoking and IVDU presented to [name] with AMS on 01/31/21. She was found to have a drug overdose, aspiration PNA requiring intubation, MRSA bacteremia, large TV endocarditis with moderate TR and elevated systemic pulmonary pressures (23mhg), AKI requiring HD, anemia requiring PRBC and thrombocytopenia. Urine drug screen + for heroin and methamphetamines.     [place]."   P[place]/ Procedure: [place]  8/[place]: On pump beating heart TVR (Epic 33)  PFO colsure   Hospital Course/Major events:  8/22: See above.   -Prop for sedation. Agitated; MAE; does not follow. Repeat CT Head/CAP. Hemodynamics stable. Intubated. Repeated blood cultures. Continue Vanc.   8/23: had pan CT 8/22, still not awakening/folowing commands on precedex  But agitated. MRI brain ordered, started on CRRT  8/24: remains on CRRT, MRI Brain no abnormality, following commands on dexmedetomidine, quetiapine, prn dilaudid  8/25 brocnh today for increased ETT secretions,plan for OR early next week, OMFS following for dental root extriction.  Pt self-extubated this afternoon.  Doing well on NC.  8/26: Intermittent delirium. Weak. Continue Seroquel.  Hemodynamics stable. NC. Aggressive pulm toilet. Continue nebs. Speech to see. Dental procedure today. Continues + BC. ID following; MRI CTLS ordered. Continue antibiotics. Surgical plan TBA.   8/27:  MRI completed today. Intermittent dex. Surgical plan TBA. HD today per nephology. Bedside speech and swallow. Blood  repeat Cx tomorrow.   8/28 repeat TTE today for OR later this week, HD holiday will give 200 mg Lasix for diuresis (followed by diuril 5051mif no response w/lasix) and D/C [place] for line holiday  8/29: OR tomorrow. Agitation. Seroquel. Hemodynamics stable. HD today prior to surgery. New line placed 2/2 line holiday. 2 u PRBC pre op optimization.   8/30: OR for TVR, PFO closure, out of OR intubated, levophed infusing, normal EF  8/31: OBT, deline, continue CT, HD today, repeat BC  9/1: hypertensive; started lopressor 6.2546mID, repeat BC, lasix 80 IV BID started, holding HD, repeat BC   9/2: Decrease Seroquel, increase BB, d/c pacer wires and groin bumper, lasix 200, Diuril 500, place trialysis line, give 1st dose coumadin tonight, [facility] BC [facility]: switched to daptomycin from vancomycin due to rash.   9/4: continues on line holiday, getting out of bed with no new complaints.   9/5:  Hyponatremic (120m61m  Fluid restriction.  Increase diuresis.  Added Sevelamer.  Received 1 dose of diuril, held 2/2 hyponatremia.  9/6: Continue Seroquel. PT/OT. INR for potential coumadin dose. Repeat H/H dose. Pulm toilet. Encourage PO intake. Bumex 2mg 8mfor diuresis. No dialysis indication today. Continue to monitor BMP (sodium). Salt tabs. Possible tx to SDS.   9/7: Increased Seroquel and melatonin at night. Hemodynamics stable. 1 u PRBC this am. Iron studies sent +, IV iron ordered q48 for 4 doses. Pulm toilet. Continue bumex. No indication for HD today. Continue anbx. PICC ordered for access. Tx to SDS.    Past Medical History: No past medical history on file.  Past Surgical History: No past surgical history on file.   Social History:  Social History            Socioeconomic History   . Marital status: Divorced       Spouse name: Not on file   . Number of children: Not on file   . Years of education: Not on file   . Highest education level: Not on file   Occupational History   . Not on file   Tobacco Use   . Smoking status:  Not on file   . Smokeless tobacco: Not on file   Substance and Sexual Activity   . Alcohol use: Not on file   . Drug use: Not on file   . Sexual activity: Not on file   Other Topics Concern   . Not on file   Social History Narrative   . Not on file      Social Determinants of Health      Financial Resource Strain: Not on file   Food Insecurity: Not on file   Transportation Needs: Not on file   Physical Activity: Not on file   Stress: Not on file   Intimate Partner Violence: Not on file   Housing Stability: Not on file       Family History: Family Medical History:       None                Allergies: No Known Allergies   ROS: Continue to have improved pain and shortness of breath. Not sleeping well.   No other new complaints       Problem List:       Patient Active Problem List   Diagnosis   . Endocarditis         Medications: Reviewed      Last VS:    .  Filed Vitals:    02/21/21 0400 02/21/21 0500 02/21/21 0600 02/21/21 0800   BP: 135/70 (!) 126/106 (!) 148/92    Pulse: 87 85 86    Resp: _0 Temp: 36.8 C (98.2 F)   36.8 C (98.3 F)   SpO2: 97% 96% 97%         Physical Exam  Vitals reviewed.   Constitutional:       Appearance: She is ill-appearing.      Comments:  Acutely ill appearing female, no acute distress, becomes restless at times   HENT:      Head: Normocephalic.      Mouth/Throat:      Mouth: Mucous membranes are moist.   Eyes:       Pupils: Pupils are equal, round, and reactive to light.   Cardiovascular:      Rate and Rhythm: Regular rhythm.      Pulses: Normal pulses.   Pulmonary:      Comments: Diminished breath sounds     Abdominal:      General: Abdomen is flat. There is no distension.      Palpations: Abdomen is soft. There is no mass.      Hernia: No hernia is present.      Comments:  +bowel sounds   Musculoskeletal:         General: Swelling present. Normal range of motion.      Cervical back: No rigidity.      Right lower leg: Edema present.      Left lower leg: Edema present.   Skin:  General: Skin is warm and dry. B/l lower extremities with palpable purpura   Neurological:      General: No focal deficit present.      Mental Status: She is alert and oriented to person, place, and time.   GCS 15  Psychiatric:      Comments: Anxious       [x]I have reviewed the patient's vitals signs, the nursing notes, the physician's notes and other progress notes      [x]I have reviewed the laboratory and imaging data        Pt was reviewed with CCM attending and SCT      Systems Based Assessment and Plan:     Neurologic:  Data/Assessment Reviewed    Diagnosis Altered mental status 2/2 encephalopathy   Overdose 2/2 IVDU  IVDA withdrawal  Back pain    Course:  improving    Plan Continue oral oxy   Increased nightly Seroquel and melatonin       -Sleep hygiene; no labs until 0500 or if awake on own early morning; cluster care   MRI Brain 8/23 negative  MRI of the cervical, thoracic, and lumbosacral spine completed to r/o abscess; negative   Day/night policy, reorientation as needed  PT/OT/Cardiac rehab   Serial neuro checks      Cardiovascular:  Data/Assessment Most Recent Hemodynamics: Reviewed   Cardiovascular support: N/A       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; s/p PFO closure  Moderate TR  Mild Pulmonary HTN  Hypertension    Course Improving    Plan Continue lopressor 67m BID  Coumadin QHS  -----INR 3.88 today; holding coumadin today  Continue ASA   Serial hemodynamics   Continue antibiotics       Respiratory:  Data/Assessment: CXR: Reviewed   Most recent VBG: reviewed  Vent Settings: NA    Diagnosis Acute respiratory failure 2/2 Aspiration PNA and altered mental status  Septic pulmonary emboli  Smoker  MRSA PNA   Mechanical ventilation post op    Course stable   Plan FiO2 wean as tolerated for O2 sats>92%  Pulm toilet; scheduled nebs  Repeat CXR in am   Diuresis   Continue Rifampin     Renal:  Data/Assessment: Reviewed    Diagnosis Asymptomatic Moderate Hyponatremia (Hypotonic), likely  2/2 recent AKI/Renal failure  Acute kidney injury - multifactorial: septic shock 2/2 MRSA TV endocarditis, IVDU  Hypervolemia and anasarca   Course Critical   Plan Lasix changed to Bumex [id number]  No indication for RRT at this time   Last HD (8/31)   fluid restriction 1 liter   Continue salt tabs  Replacing electrolytes per order  Strict I&O  Renally adjust medications   Avoid nephrotoxic agents       Gastrointestinal:  Data/Assessment: Protonix for stress ulcer prophylaxis   Diet:  Taking PO     Bowel Regimen:  Senna  Miralax     Diagnosis Dysphagia  Enlarged liver  Distended gallbladder with mild elevation in Alk phos and bilirubin  constipation   Course  Stable    Plan +BM  Advance diet as tolerated   Continue stress ulcer prophylaxis   Speech/bedside to evaluate swallow; passed       Endocrine:  Data/Assessment Last three Fingerstick glucose: Reviewed   Last HbA1C: N/A  Most recent Thyroid results:  2.3      Diagnosis Hyperglycemia    Course Stable   Plan Target Goal 140-180  SSI Protocol  PRN       Hematologic:  Data/Assessment:  Reviewed    Diagnosis Anemia of chronic disease and Thrombocytopenia  Iron deficiency Anemia   Course Stable   Plan SCDs (Sequential Compression Device)  For DVT Prophylaxis  Coumadin QHS for TV; dose dependent on INR; holding today   Continue to closely monitor cbc/coags   Monitor CT output    1u PRBC this morning; no indication for recheck unless indicated    Iron studies sent; will do IV iron q48hrs for 4 doses; transition to oral after       Infectious Diseases:  Data/Assessment Tmax last 24 hrs: Reviewed        Diagnosis MRSA TV Endocarditis  MRSA PNA    Course Stable    Plan Due to rash transitioned to daptomycin 18m/kg q48h on 02/17/21  [facility]30 + [facility]   [facility]   [facility]   [facility]     Derm consulted on 9/2 for B/L lower extremity purpura ; punch bx on RLE pending to r/o leukocytoclastic vasculitis 2/2 bacteremia; continue ointment.     Antibiotic  therapy  Agent:  Dapto/Rifampin 6wks from negative blood cultures  Indication: MRSA PNA and bacteremia      Will continue to monitor for additional s/s of infection         Family Communication and Disposition:  Data/Assessment Patient updated with plan of care     I independently of the faculty provider spent a total of 39 minutes in direct/indirect care of this patient including initial evaluation, review of laboratory, radiology, diagnostic studies, review of medical record, order entry and coordination of care.  KKathaleen Bury APRN,AGACNP-BC  02/21/2021, 08:58    Pt seen and examined at bedside.  Decreased b/l l/e pitting edema today.  Will c/w current bumex regimen (lasix d/c'd 9/6).  Pt had multiple undocumented voids yesterday; I/O not completely accurate.  Recommend c/w daily standing weights for better volume status assessment.  No current indication for HD.  ALindalou Hose MD  H[facility] CCM

## 2021-02-22 ENCOUNTER — Inpatient Hospital Stay (HOSPITAL_COMMUNITY): Payer: Medicaid Other | Admitting: Radiology

## 2021-02-22 DIAGNOSIS — I776 Arteritis, unspecified: Secondary | ICD-10-CM

## 2021-02-22 DIAGNOSIS — R918 Other nonspecific abnormal finding of lung field: Secondary | ICD-10-CM

## 2021-02-22 DIAGNOSIS — J90 Pleural effusion, not elsewhere classified: Secondary | ICD-10-CM

## 2021-02-22 DIAGNOSIS — R609 Edema, unspecified: Secondary | ICD-10-CM

## 2021-02-22 DIAGNOSIS — F19139 Other psychoactive substance abuse with withdrawal, unspecified (CMS HCC): Secondary | ICD-10-CM

## 2021-02-22 DIAGNOSIS — I517 Cardiomegaly: Secondary | ICD-10-CM

## 2021-02-22 DIAGNOSIS — Z452 Encounter for adjustment and management of vascular access device: Secondary | ICD-10-CM

## 2021-02-22 LAB — TYPE AND SCREEN: UNITS ORDERED: 2

## 2021-02-22 LAB — MANUAL DIFF AND MORPHOLOGY-SYSMEX
BASOPHIL #: 0.11 10*3/uL (ref <=0.20)
BASOPHIL %: 1 %
EOSINOPHIL #: <0.1 10*3/uL (ref <=0.50)
EOSINOPHIL %: 0 %
LYMPHOCYTE #: 0.34 10*3/uL — ABNORMAL LOW (ref 1.00–4.80)
LYMPHOCYTE %: 3 %
METAMYELOCYTE %: 2 %
MONOCYTE #: 0.23 10*3/uL (ref 0.20–1.10)
MONOCYTE %: 2 %
MYELOCYTE %: 3 %
NEUTROPHIL #: 10.26 10*3/uL — ABNORMAL HIGH (ref 1.50–7.70)
NEUTROPHIL %: 90 %
RBC MORPHOLOGY: NORMAL

## 2021-02-22 LAB — BASIC METABOLIC PANEL
ANION GAP: 14 mmol/L — ABNORMAL HIGH (ref 4–13)
BUN/CREA RATIO: 36 — ABNORMAL HIGH (ref 6–22)
BUN: 55 mg/dL — ABNORMAL HIGH (ref 8–25)
CALCIUM: 8.6 mg/dL (ref 8.5–10.0)
CHLORIDE: 98 mmol/L (ref 96–111)
CO2 TOTAL: 18 mmol/L — ABNORMAL LOW (ref 22–30)
CREATININE: 1.54 mg/dL — ABNORMAL HIGH (ref 0.60–1.05)
ESTIMATED GFR: 45 mL/min/BSA — ABNORMAL LOW (ref >=60)
GLUCOSE: 91 mg/dL (ref 65–125)
POTASSIUM: 3.8 mmol/L (ref 3.5–5.1)
SODIUM: 130 mmol/L — ABNORMAL LOW (ref 136–145)

## 2021-02-22 LAB — PT/INR
INR: 2.53 — ABNORMAL HIGH (ref 0.80–1.20)
PROTHROMBIN TIME: 29.7 seconds — ABNORMAL HIGH (ref 9.1–13.9)

## 2021-02-22 LAB — CBC WITH DIFF
HCT: 23.1 % — ABNORMAL LOW (ref 34.8–46.0)
HGB: 7.6 g/dL — ABNORMAL LOW (ref 11.5–16.0)
MCH: 26.8 pg (ref 26.0–32.0)
MCHC: 32.9 g/dL (ref 31.0–35.5)
MCV: 81.3 fL (ref 78.0–100.0)
MPV: 8.3 fL — ABNORMAL LOW (ref 8.7–12.5)
PLATELETS: 392 10*3/uL (ref 150–400)
RBC: 2.84 10*6/uL — ABNORMAL LOW (ref 3.85–5.22)
RDW-CV: 18.9 % — ABNORMAL HIGH (ref 11.5–15.5)
WBC: 11.4 10*3/uL — ABNORMAL HIGH (ref 3.7–11.0)

## 2021-02-22 LAB — HEPATIC FUNCTION PANEL
ALBUMIN: 2.3 g/dL — ABNORMAL LOW (ref 3.5–5.0)
ALKALINE PHOSPHATASE: 77 U/L (ref 40–110)
ALT (SGPT): 5 U/L — ABNORMAL LOW (ref 8–22)
AST (SGOT): 22 U/L (ref 8–45)
BILIRUBIN DIRECT: 0.3 mg/dL (ref 0.1–0.4)
BILIRUBIN TOTAL: 0.5 mg/dL (ref 0.3–1.3)
PROTEIN TOTAL: 7 g/dL (ref 6.4–8.3)

## 2021-02-22 LAB — MAGNESIUM: MAGNESIUM: 2 mg/dL (ref 1.8–2.6)

## 2021-02-22 LAB — PHOSPHORUS: PHOSPHORUS: 6.7 mg/dL — ABNORMAL HIGH (ref 2.4–4.7)

## 2021-02-22 LAB — C-REACTIVE PROTEIN(CRP),INFLAMMATION: CRP INFLAMMATION: 96.2 mg/L — ABNORMAL HIGH (ref <8.0)

## 2021-02-22 MED ORDER — FAMOTIDINE 20 MG TABLET
20.0000 mg | ORAL_TABLET | Freq: Every day | ORAL | Status: DC
Start: 2021-02-22 — End: 2021-03-02
  Administered 2021-02-22 – 2021-03-02 (×9): 20 mg via ORAL
  Filled 2021-02-22 (×9): qty 1

## 2021-02-22 MED ORDER — WARFARIN 1 MG TABLET
1.0000 mg | ORAL_TABLET | Freq: Once | ORAL | Status: AC
Start: 2021-02-22 — End: 2021-02-22
  Administered 2021-02-22: 1 mg via ORAL
  Filled 2021-02-22: qty 1

## 2021-02-22 MED ORDER — POTASSIUM CHLORIDE ER 20 MEQ TABLET,EXTENDED RELEASE(PART/CRYST)
20.0000 meq | ORAL_TABLET | Freq: Once | ORAL | Status: AC
Start: 2021-02-22 — End: 2021-02-22
  Administered 2021-02-22: 20 meq via ORAL
  Filled 2021-02-22: qty 1

## 2021-02-22 MED ORDER — NICOTINE (POLACRILEX) 2 MG BUCCAL LOZENGE
2.0000 mg | LOZENGE | BUCCAL | Status: DC | PRN
Start: 2021-02-22 — End: 2021-03-02
  Administered 2021-02-22: 2 mg via ORAL
  Filled 2021-02-22: qty 24

## 2021-02-22 NOTE — Progress Notes (Signed)
[facility]  HVI Critical Care Consult/Progress Note     Hasset, Chaviano, 35 y.o. female  Date of Birth: [birth year]  Medical Record Number:  [id number]   Inpatient Admission Date: 02/05/2021   Hospital Day:  LOS: 0 days      Chief Complaint: Altered Mental Status   History of Present Illness: Per previous providers: "Pt is a 35 yo female with a significant PMH for Smoking and IVDU presented to [name] with AMS on 01/31/21. She was found to have a drug overdose, aspiration PNA requiring intubation, MRSA bacteremia, large TV endocarditis with moderate TR and elevated systemic pulmonary pressures (35mhg), AKI requiring HD, anemia requiring PRBC and thrombocytopenia. Urine drug screen + for heroin and methamphetamines.     [facility]."   POD #/ Procedure: 8/22: [facility]  8/30: On pump beating heart TVR (Epic 33)  PFO colsure   Hospital Course/Major events:  8/22: See above.   -Prop for sedation. Agitated; MAE; does not follow. Repeat CT Head/CAP. Hemodynamics stable. Intubated. Repeated blood cultures. Continue Vanc.   8/23: had pan CT 8/22, still not awakening/folowing commands on precedex  But agitated. MRI brain ordered, started on CRRT  8/24: remains on CRRT, MRI Brain no abnormality, following commands on dexmedetomidine, quetiapine, prn dilaudid  8/25 brocnh today for increased ETT secretions,plan for OR early next week, OMFS following for dental root extriction.  Pt self-extubated this afternoon.  Doing well on NC.  8/26: Intermittent delirium. Weak. Continue Seroquel.  Hemodynamics stable. NC. Aggressive pulm toilet. Continue nebs. Speech to see. Dental procedure today. Continues + BC. ID following; MRI CTLS ordered. Continue antibiotics. Surgical plan TBA.   8/27:  MRI completed today. Intermittent dex. Surgical plan TBA. HD today per nephology. Bedside speech and swallow. Blood  repeat Cx tomorrow.   8/28 repeat TTE today for OR later this week, HD holiday will give 200 mg Lasix for diuresis (followed by diuril 5077mif no response w/lasix) and D/C [place] for line holiday  8/29: OR tomorrow. Agitation. Seroquel. Hemodynamics stable. HD today prior to surgery. New line placed 2/2 line holiday. 2 u PRBC pre op optimization.   8/30: OR for TVR, PFO closure, out of OR intubated, levophed infusing, normal EF  8/31: OBT, deline, continue CT, HD today, repeat BC  9/1: hypertensive; started lopressor 6.2518mID, repeat BC, lasix 80 IV BID started, holding HD, repeat BC   9/2: Decrease Seroquel, increase BB, d/c pacer wires and groin bumper, lasix 200, Diuril 500, place trialysis line, give 1st dose coumadin tonight, repeat BC [facility]9/3: switched to daptomycin from vancomycin due to rash.   9/4: continues on line holiday, getting out of bed with no new complaints.   9/5:  Hyponatremic (120m25m  Fluid restriction.  Increase diuresis.  Added Sevelamer.  Received 1 dose of diuril, held 2/2 hyponatremia.  9/6: Continue Seroquel. PT/OT. INR for potential coumadin dose. Repeat H/H dose. Pulm toilet. Encourage PO intake. Bumex 2mg 64mfor diuresis. No dialysis indication today. Continue to monitor BMP (sodium). Salt tabs. Possible tx to SDS.   9/7: Increased Seroquel and melatonin at night. Hemodynamics stable. 1 u PRBC this am. Iron studies sent +, IV iron ordered q48 for 4 doses. Pulm toilet. Continue bumex. No indication for HD today. Continue anbx. PICC ordered for access. Tx to SDS.   9/8: Monitor right effusion, remains on RA. Continue diuresis with  Bumex 31m Q6. Continue ATB. F/u Derm regarding punch biopsy results, will start Pepcid for anti-histamine. Continue IV iron. Awaiting step down bed.    Past Medical History: No past medical history on file.    Past Surgical History: No past surgical history on file.   Social History:  Social History            Socioeconomic History   . Marital status:  Divorced       Spouse name: Not on file   . Number of children: Not on file   . Years of education: Not on file   . Highest education level: Not on file   Occupational History   . Not on file   Tobacco Use   . Smoking status: Not on file   . Smokeless tobacco: Not on file   Substance and Sexual Activity   . Alcohol use: Not on file   . Drug use: Not on file   . Sexual activity: Not on file   Other Topics Concern   . Not on file   Social History Narrative   . Not on file      Social Determinants of Health      Financial Resource Strain: Not on file   Food Insecurity: Not on file   Transportation Needs: Not on file   Physical Activity: Not on file   Stress: Not on file   Intimate Partner Violence: Not on file   Housing Stability: Not on file       Family History: Family Medical History:       None                Allergies: No Known Allergies   ROS: Continue to have improved pain and shortness of breath. Not sleeping well.   No other new complaints       Problem List:       Patient Active Problem List   Diagnosis   . Endocarditis         Medications: Reviewed      Last VS:    .  Filed Vitals:    02/22/21 0500 02/22/21 0600 02/22/21 0715 02/22/21 0730   BP: (!) 137/93 (!) 131/92  136/86   Pulse: 91 92  92   Resp: (!) 34 (!) 22  (!) 21   Temp:       SpO2: 97% 94% 96% 97%        Physical Exam  Vitals reviewed.   Constitutional:       Appearance: She is ill-appearing.      Comments:  Ill appearing female, no acute distress, becomes restless at times   HENT:      Head: Normocephalic.      Mouth/Throat:      Mouth: Mucous membranes are moist.   Eyes:       Pupils: Pupils are equal, round, and reactive to light.   Cardiovascular:      Rate and Rhythm: Regular rhythm.      Pulses: Normal pulses.   Pulmonary:      Comments: Diminished breath sounds     Abdominal:      General: Abdomen is flat. There is no distension.      Palpations: Abdomen is soft. There is no mass.      Hernia: No hernia is present.      Comments:  +bowel  sounds   Musculoskeletal:         General: Swelling present. Normal range  of motion.      Cervical back: No rigidity.      Right lower leg: Trace Edema present.      Left lower leg: Trace Edema present.   Skin:     General: Skin is warm and dry. B/l lower extremities with palpable purpura   Neurological:      General: No focal deficit present.      Mental Status: She is alert and oriented to person, place, and time.   GCS 15  Psychiatric:      Comments: Anxious       _0 I have reviewed the patient's vitals signs, the nursing notes, the physician's notes and other progress notes      _1 I have reviewed the laboratory and imaging data        Pt was reviewed with CCM attending and SCT      Systems Based Assessment and Plan:     Neurologic:  Data/Assessment Reviewed    Diagnosis Altered mental status 2/2 encephalopathy   Overdose 2/2 IVDU  IVDA withdrawal  Back pain    Course:  improving    Plan Continue oral oxy   Continue Seroquel 163m QHS and melatonin       -Sleep hygiene; no labs until 0500 or if awake on own early morning; cluster care   MRI Brain 8/23 negative  MRI of the cervical, thoracic, and lumbosacral spine completed to r/o abscess; negative   Day/night policy, reorientation as needed  PT/OT/Cardiac rehab   Serial neuro checks      Cardiovascular:  Data/Assessment Most Recent Hemodynamics: Reviewed   Cardiovascular support: N/A       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; s/p PFO closure  Moderate TR  Mild Pulmonary HTN  Hypertension    Course Improving    Plan Continue lopressor 22mBID  Coumadin QHS  -----INR 2.53 today; 36m58moumadin tonight  Continue ASA   Serial hemodynamics   Continue antibiotics       Respiratory:  Data/Assessment: CXR: Reviewed   Most recent VBG: reviewed  Vent Settings: NA    Diagnosis Acute respiratory failure 2/2 Aspiration PNA and altered mental status  Septic pulmonary emboli  Smoker  MRSA PNA   Mechanical ventilation post op    Course stable   Plan FiO2 wean as  tolerated for O2 sats>92%  Pulm toilet; scheduled nebs  Repeat CXR in am   Diuresis Bumex 2mg18m  Continue Rifampin     Renal:  Data/Assessment: Reviewed    Diagnosis Asymptomatic Moderate Hyponatremia (Hypotonic), likely 2/2 recent AKI/Renal failure  Acute kidney injury - multifactorial: septic shock 2/2 MRSA TV endocarditis, IVDU  Hypervolemia and anasarca   Course Improving   Plan Lasix changed to Bumex 2mg 38m No indication for RRT at this time   Last HD (8/31)   fluid restriction 1 liter   Continue salt tabs  Replacing electrolytes per order  Strict I&O  Renally adjust medications   Avoid nephrotoxic agents       Gastrointestinal:  Data/Assessment: Protonix for stress ulcer prophylaxis   Diet:  Taking PO     Bowel Regimen:  Senna  Miralax     Diagnosis Dysphagia  Enlarged liver  Distended gallbladder with mild elevation in Alk phos and bilirubin  constipation   Course  Stable    Plan +BM  Advance diet as tolerated   Continue stress ulcer prophylaxis   Speech/bedside to evaluate swallow; passed  Endocrine:  Data/Assessment Last three Fingerstick glucose: Reviewed   Last HbA1C: N/A  Most recent Thyroid results:  2.3      Diagnosis Hyperglycemia    Course Stable   Plan Target Goal 140-180  SSI Protocol PRN       Hematologic:  Data/Assessment:  Reviewed    Diagnosis Anemia of chronic disease and Thrombocytopenia  Iron deficiency Anemia   Course Stable   Plan SCDs (Sequential Compression Device)  For DVT Prophylaxis  Coumadin QHS for TV; dose dependent on INR; 54m 9/8   Continue to closely monitor cbc/coags     1u PRBC this morning 9/7; no indication for recheck unless indicated    Iron studies sent; will do IV iron q48hrs for 4 doses; transition to oral after       Infectious Diseases:  Data/Assessment Tmax last 24 hrs: Reviewed        Diagnosis MRSA TV Endocarditis  MRSA PNA    Course Stable    Plan Due to rash transitioned to daptomycin 842mkg q48h on 02/17/21  BAL 8/30 + MRSA   [facility]   [facility]   [facility]   [facility] NGTD     Derm consulted on 9/2 for B/L lower extremity purpura ; punch bx on RLE pending to r/o leukocytoclastic vasculitis 2/2 bacteremia; continue ointment.     Antibiotic therapy  Agent:  Dapto/Rifampin 6wks from negative blood cultures end date 03/29/21  Indication: MRSA PNA and bacteremia      Will continue to monitor for additional s/s of infection         Family Communication and Disposition:  Data/Assessment Patient updated with plan of care. Transfer to step down.     I independently of the faculty provider spent a total of 38 minutes in direct/indirect care of this patient including initial evaluation, review of laboratory, radiology, diagnostic studies, review of medical record, order entry and coordination of care.  AlWarden FillersAPRN  02/22/2021, 08:56    Attending Attestation:  Pt seen and examined at bedside.  Decreased b/l l/e pitting edema.  Will c/w current bumex regimen (lasix d/c'd 9/6).  Pt had multiple undocumented voids again yesterday; I/O not completely accurate.  Recommend c/w daily standing weights for better volume status assessment.  No current indication for HD.  D/c Protonix 2/2 +path report of leukocytoclastic vasculitis and will give antihistamine instead, Pepcid.  Will f/u w/derm.    2516mindependent provider NonCCM time in overall assessment, review, documentation, family discussion; excludes procedure time.    AngJuan QuamD  HVI CCM

## 2021-02-22 NOTE — Ancillary Notes (Signed)
[facility]  Cardiac Rehab Exercise Information      [name] [name], [name] [name], 35 y.o.   Date of Birth:  14-Oct-1985  Allergies: No Known Allergies    EXERCISE INFORMATION:  Resting heart rate: 88    Resting blood pressure: 130/86    Oxygen Liter flow (L/min): 0 L/min    Resting SpO2: 93 %    Exercise type: Ambulate    Distance: 265 ft, 1 seated break    Rate of perceived exertion: 12/20    Pain scale: 5/10 (low back)    Symptoms: None    Assistance: x2 standby, x1 chair follow (not used)    Maximum heart rate: 103    Recovery heart rate: 97    Recovery blood pressure: 138/91    Recovery SpO2: 97        **See cardiac exercise flowsheet/tab for further details**    [name], BS, ES  Cardiac Rehabilitation  [phone]

## 2021-02-22 NOTE — Care Plan (Signed)
[facility]  Medical Nutrition Therapy Follow Up    SUBJECTIVE: Met with pt at bedside. Per EMR pts weight has remained stable, and she has been consuming 25-50% of her meals since last visit. Pt reports having a good appetite, but states she was eating poorly on a mechanical soft diet 2/2 dislike of texture. Pt has not been consuming the Ensure clear or might shake, so they will be d/c. Pt agreed to try Magic Cup BID. Pt asked if her family could bring her food, and she was advised to try to stay within the parameters of her renal diet. She denies n/v/d, but has had constipation as of late. Pts last BM was today. Will monitor progress and make further recommendations prn.     OBJECTIVE:     Current Diet Order/Nutrition Support:  MNT PROTOCOL FOR DIETITIAN  MNT PROTOCOL FOR DIETITIAN  DIET RENAL (60G PRO,2G NA,2G K) Restrict fluids to: 1500 ML  DIETARY ORAL SUPPLEMENTS Oral Supplements with tray: Magic Cup-Orange; LUNCH/DINNER; 1 Each     Height Used for Calculations: 165.1 cm (_0 )  Weight Used For Calculations: 65.6 kg (144 lb 10 oz)  BMI (kg/m2): 24.12     Ideal Body Weight (IBW) (kg): 57.29  % Ideal Body Weight: 114.51  Adjusted/Standard Body Weight  Adjusted BW: 61 kg    Net IO Since Admission: -1,757.03 mL [02/22/21 1451].    Re-assessed needs if applicable:    Energy Calorie Requirements: 1650-1950 kcal   Protein Requirements (gms/day): 57 g protein      Comments: Pt admitted as a transfer from [facility]. Per chart review, pt remains intubated, on ventilator support, and in restraints. Cardiac Surgery evaluating for surgical intervention. 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    Results for LONEY, PETO (MRN [id number]) as of 02/22/2021 15:03   Ref. Range 02/21/2021 00:29 02/21/2021 03:35   HGB Latest Ref Range: 11.5 - 16.0 g/dL 6.9 (LL) 6.4 (LL)   HCT Latest Ref Range: 34.8 - 46.0 % 20.8 (L) 19.3 (L)   Results for SOTIRIA, KEAST (MRN [id number]) as of 02/22/2021  15:03   Ref. Range 02/20/2021 11:54 02/20/2021 19:02 02/21/2021 00:29 02/22/2021 04:28   PHOSPHORUS Latest Ref Range: 2.4 - 4.7 mg/dL 6.8 (H) 6.6 (H) 6.7 (H) 6.7 (H)   Results for DESPINA, BOAN (MRN [id number]) as of 02/22/2021 15:03   Ref. Range 02/20/2021 03:55 02/20/2021 11:54 02/20/2021 19:02 02/21/2021 00:29 02/22/2021 04:28   SODIUM Latest Ref Range: 136 - 145 mmol/L 124 (L) 126 (L) 126 (L) 127 (L) 130 (L)     Plan/Interventions :   -Continue current diet, encourage po intake at meal times. If appetite does not improve recommend liberalizing diet to regular  -Continue supplements to promote oral intake - Magic Cup BID, monitor renal function and need for adjustment of supplements.  -Monitor weights 2x weekly to trend - standing for accuracy.  -Monitor renal function/mode of therapy and adjust needs as medically able.  -Continue daily nephronex.  -Consider addition of phos binder.  -RD to continue to follow.    Nutrition Diagnosis: Nutrition Diagnosis: Inadequate protein-energy intake related to decreased appetite and dislike of food as evidenced by po intake 25-50% of meals.    Rolena Infante, RDLD  02/22/2021, 14:50  Pager # [phone]

## 2021-02-23 ENCOUNTER — Inpatient Hospital Stay (HOSPITAL_COMMUNITY): Payer: Medicaid Other

## 2021-02-23 DIAGNOSIS — Z95828 Presence of other vascular implants and grafts: Secondary | ICD-10-CM

## 2021-02-23 DIAGNOSIS — J90 Pleural effusion, not elsewhere classified: Secondary | ICD-10-CM

## 2021-02-23 DIAGNOSIS — Z978 Presence of other specified devices: Secondary | ICD-10-CM

## 2021-02-23 DIAGNOSIS — J15212 Pneumonia due to Methicillin resistant Staphylococcus aureus: Secondary | ICD-10-CM

## 2021-02-23 DIAGNOSIS — I110 Hypertensive heart disease with heart failure: Secondary | ICD-10-CM

## 2021-02-23 DIAGNOSIS — R6521 Severe sepsis with septic shock: Secondary | ICD-10-CM

## 2021-02-23 DIAGNOSIS — R739 Hyperglycemia, unspecified: Secondary | ICD-10-CM

## 2021-02-23 DIAGNOSIS — I10 Essential (primary) hypertension: Secondary | ICD-10-CM

## 2021-02-23 DIAGNOSIS — R918 Other nonspecific abnormal finding of lung field: Secondary | ICD-10-CM

## 2021-02-23 DIAGNOSIS — J942 Hemothorax: Secondary | ICD-10-CM

## 2021-02-23 DIAGNOSIS — Z9889 Other specified postprocedural states: Secondary | ICD-10-CM

## 2021-02-23 DIAGNOSIS — E871 Hypo-osmolality and hyponatremia: Secondary | ICD-10-CM

## 2021-02-23 LAB — CBC WITH DIFF
HCT: 22.5 % — ABNORMAL LOW (ref 34.8–46.0)
HGB: 7.5 g/dL — ABNORMAL LOW (ref 11.5–16.0)
MCH: 27.2 pg (ref 26.0–32.0)
MCHC: 33.3 g/dL (ref 31.0–35.5)
MCV: 81.5 fL (ref 78.0–100.0)
MPV: 8 fL — ABNORMAL LOW (ref 8.7–12.5)
PLATELETS: 377 10*3/uL (ref 150–400)
RBC: 2.76 10*6/uL — ABNORMAL LOW (ref 3.85–5.22)
RDW-CV: 18.8 % — ABNORMAL HIGH (ref 11.5–15.5)
WBC: 11.3 10*3/uL — ABNORMAL HIGH (ref 3.7–11.0)

## 2021-02-23 LAB — MANUAL DIFF AND MORPHOLOGY-SYSMEX
BASOPHIL #: 0.23 10*3/uL — ABNORMAL HIGH (ref <=0.20)
BASOPHIL %: 2 %
EOSINOPHIL #: 0.23 10*3/uL (ref <=0.50)
EOSINOPHIL %: 2 %
LYMPHOCYTE #: 0.9 10*3/uL — ABNORMAL LOW (ref 1.00–4.80)
LYMPHOCYTE %: 8 %
METAMYELOCYTE %: 2 %
MONOCYTE #: 0.34 10*3/uL (ref 0.20–1.10)
MONOCYTE %: 3 %
NEUTROPHIL #: 9.61 10*3/uL — ABNORMAL HIGH (ref 1.50–7.70)
NEUTROPHIL %: 84 %
NEUTROPHIL BANDS %: 1 %
RBC MORPHOLOGY: NORMAL

## 2021-02-23 LAB — BASIC METABOLIC PANEL
ANION GAP: 10 mmol/L (ref 4–13)
BUN/CREA RATIO: 35 — ABNORMAL HIGH (ref 6–22)
BUN: 60 mg/dL — ABNORMAL HIGH (ref 8–25)
CALCIUM: 8.7 mg/dL (ref 8.5–10.0)
CHLORIDE: 101 mmol/L (ref 96–111)
CO2 TOTAL: 20 mmol/L — ABNORMAL LOW (ref 22–30)
CREATININE: 1.71 mg/dL — ABNORMAL HIGH (ref 0.60–1.05)
ESTIMATED GFR: 40 mL/min/BSA — ABNORMAL LOW (ref >=60)
GLUCOSE: 100 mg/dL (ref 65–125)
POTASSIUM: 3.8 mmol/L (ref 3.5–5.1)
SODIUM: 131 mmol/L — ABNORMAL LOW (ref 136–145)

## 2021-02-23 LAB — PROTEIN BODY FLUID: PROTEIN BODY FLUID: 4.8 g/dL

## 2021-02-23 LAB — LDH, BODY FLUID: LDH BODY FLUID: 537 U/L

## 2021-02-23 LAB — GLUCOSE BODY FLUID: GLUCOSE BODY FLUID: 79 mg/dL (ref 65–125)

## 2021-02-23 LAB — MAGNESIUM: MAGNESIUM: 1.8 mg/dL (ref 1.8–2.6)

## 2021-02-23 LAB — PT/INR
INR: 2.74 — ABNORMAL HIGH (ref 0.80–1.20)
PROTHROMBIN TIME: 32.2 seconds — ABNORMAL HIGH (ref 9.1–13.9)

## 2021-02-23 LAB — BODY FLUID CELL COUNT WITH DIFFERENTIAL
NUCLEATED CELLS, FLUID: 4082 /uL
RBC COUNT: 620747 /uL

## 2021-02-23 LAB — BODY FLUID SEROUS MAN DIFF
EOSINOPHIL %: 1 %
LYMPHOCYTE %: 53 %
MONOCYTE/MACROPHAGE %: 11 %
NEUTROPHIL %: 35 %

## 2021-02-23 LAB — H & H
HCT: 25.2 % — ABNORMAL LOW (ref 34.8–46.0)
HGB: 8.2 g/dL — ABNORMAL LOW (ref 11.5–16.0)

## 2021-02-23 LAB — PHOSPHORUS: PHOSPHORUS: 6.8 mg/dL — ABNORMAL HIGH (ref 2.4–4.7)

## 2021-02-23 MED ORDER — ALBUMIN, HUMAN 25 % INTRAVENOUS SOLUTION
25.0000 g | Freq: Four times a day (QID) | INTRAVENOUS | Status: AC
Start: 2021-02-23 — End: 2021-02-23
  Administered 2021-02-23: 0 g via INTRAVENOUS
  Administered 2021-02-23 (×2): 25 g via INTRAVENOUS
  Administered 2021-02-23: 0 g via INTRAVENOUS
  Administered 2021-02-23: 25 g via INTRAVENOUS
  Administered 2021-02-23: 0 g via INTRAVENOUS
  Filled 2021-02-23 (×3): qty 100

## 2021-02-23 MED ORDER — MAGNESIUM SULFATE 4 GRAM/100 ML (4 %) IN WATER INTRAVENOUS PIGGYBACK
4.0000 g | INJECTION | Freq: Once | INTRAVENOUS | Status: AC
Start: 2021-02-23 — End: 2021-02-23
  Administered 2021-02-23: 0 g via INTRAVENOUS
  Administered 2021-02-23: 4 g via INTRAVENOUS
  Filled 2021-02-23: qty 100

## 2021-02-23 MED ORDER — ETOMIDATE 2 MG/ML INTRAVENOUS SOLUTION
INTRAVENOUS | Status: AC
Start: 2021-02-23 — End: 2021-02-23
  Administered 2021-02-23: 8 mg via INTRAVENOUS
  Filled 2021-02-23: qty 20

## 2021-02-23 MED ORDER — WARFARIN 1 MG TABLET
0.5000 mg | ORAL_TABLET | Freq: Once | ORAL | Status: AC
Start: 2021-02-23 — End: 2021-02-23
  Administered 2021-02-23: 0.5 mg via ORAL
  Filled 2021-02-23: qty 0.5

## 2021-02-23 MED ORDER — HYDROMORPHONE 1 MG/ML INJECTION WRAPPER
0.4000 mg | INJECTION | INTRAMUSCULAR | Status: AC
Start: 2021-02-23 — End: 2021-02-23
  Administered 2021-02-23 (×2): 0.4 mg via INTRAVENOUS
  Filled 2021-02-23: qty 1

## 2021-02-23 MED ORDER — POTASSIUM CHLORIDE ER 20 MEQ TABLET,EXTENDED RELEASE(PART/CRYST)
20.0000 meq | ORAL_TABLET | ORAL | Status: AC
Start: 2021-02-23 — End: 2021-02-23
  Administered 2021-02-23: 20 meq via ORAL
  Filled 2021-02-23: qty 1

## 2021-02-23 MED ORDER — ETOMIDATE 2 MG/ML INTRAVENOUS SOLUTION
8.0000 mg | Freq: Once | INTRAVENOUS | Status: AC
Start: 2021-02-23 — End: 2021-02-23

## 2021-02-23 MED ORDER — HYDROMORPHONE 1 MG/ML INJECTION WRAPPER
0.4000 mg | INJECTION | INTRAMUSCULAR | Status: DC
Start: 2021-02-23 — End: 2021-02-23

## 2021-02-23 MED ORDER — HYDROMORPHONE 1 MG/ML INJECTION WRAPPER
0.4000 mg | INJECTION | INTRAMUSCULAR | Status: AC
Start: 2021-02-23 — End: 2021-02-23
  Administered 2021-02-23 (×2): 0.4 mg via INTRAVENOUS

## 2021-02-23 MED ORDER — BUMETANIDE 0.25 MG/ML INJECTION SOLUTION
4.0000 mg | Freq: Three times a day (TID) | INTRAMUSCULAR | Status: DC
Start: 2021-02-23 — End: 2021-02-23

## 2021-02-23 MED ORDER — LIDOCAINE 3.6 %-MENTHOL 1.25 % TOPICAL PATCH
1.0000 | MEDICATED_PATCH | Freq: Every day | CUTANEOUS | Status: DC
Start: 2021-02-23 — End: 2021-03-02
  Administered 2021-02-23 – 2021-03-02 (×8): 1 via TRANSDERMAL
  Filled 2021-02-23 (×3): qty 1

## 2021-02-23 MED ORDER — HYDROMORPHONE 1 MG/ML INJECTION WRAPPER
0.2000 mg | INJECTION | INTRAMUSCULAR | Status: AC
Start: 2021-02-23 — End: 2021-02-23
  Administered 2021-02-23 (×2): 0.2 mg via INTRAVENOUS

## 2021-02-23 MED ORDER — BUMETANIDE 0.25 MG/ML INJECTION SOLUTION
2.0000 mg | Freq: Four times a day (QID) | INTRAMUSCULAR | Status: DC
Start: 2021-02-23 — End: 2021-02-24
  Administered 2021-02-23 – 2021-02-24 (×4): 2 mg via INTRAVENOUS
  Filled 2021-02-23 (×8): qty 8

## 2021-02-23 MED ORDER — HYDROMORPHONE 1 MG/ML INJECTION WRAPPER
0.4000 mg | INJECTION | INTRAMUSCULAR | Status: DC | PRN
Start: 2021-02-23 — End: 2021-02-25
  Administered 2021-02-23 – 2021-02-25 (×9): 0.4 mg via INTRAVENOUS
  Filled 2021-02-23 (×9): qty 1

## 2021-02-23 MED ORDER — LIDOCAINE (PF) 10 MG/ML (1 %) INJECTION SOLUTION
INTRAMUSCULAR | Status: AC
Start: 2021-02-23 — End: 2021-02-24
  Filled 2021-02-23: qty 30

## 2021-02-23 NOTE — Care Management Notes (Signed)
[facility]  Care Management Note    Patient Name: [name] [name]  Date of Birth: 05/08/1986  Sex: female  Date/Time of Admission: 02/05/2021 12:34 AM  Room/Bed: 19/A  Payor: [place] MEDICAID / Plan: [place] MEDICAID / Product Type: Medicaid [place] /    LOS: 18 days   Primary Care Providers:  Pcp, No (General)    Admitting Diagnosis:  Endocarditis [I38]    Assessment:      02/23/21 1734   Assessment Detail   Assessment Type Continued Assessment   Date of Care Management Update 02/23/21   Date of Next DCP Update 02/26/21   Social Work Plan   Discharge Planning Status plan in progress   Projected Discharge Date 03/02/21   CM will evaluate for rehabilitation potential yes   Discharge Needs Assessment   Discharge Facility/Level of Care Needs Home (Patient/Family Member/other)(code 1)   Transportation Available car;family or friend will provide       Discharge Plan:  Home (Patient/Family Member/other) (code 1)  Per chart review, POD#10 s/p TVR. Pigtail chest tube was placed today (9/9). Pt is on room air. 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 Dietitian following. PT/OT following; PT/OT evaluated the pt on 9/6, and at that time recommended home with assist at d/c. D/C date TBD, pending pt's progress and abx course. MSW Jearld Shines will continue to follow.     The patient will continue to be evaluated for developing discharge needs.     Case Manager: Elease Hashimoto, Industry  Phone: [phone]

## 2021-02-23 NOTE — Nurses Notes (Addendum)
Right pleural pigtail placed at bedside by Dr. Acie Fredrickson and CT surgery fellow. APP A. Sloan at bedside to assist and administer sedation. Dilaudid and etomidate given per MAR. Tube placed at 1210 and drained 950cc sanguinous fluid. Chest tube currently to -20cm dry suction. Patient tolerated well, no issues following sedation. Will continue to monitor.

## 2021-02-23 NOTE — Ancillary Notes (Signed)
Visited patient for ambulation, patient refused due to not feeling well at this time.      Will follow-up with patient for further cardiac rehab services.    Patient was in bed with call bell within reach.      [name], BS, ES  Cardiac Rehabilitation  [phone]

## 2021-02-23 NOTE — Procedures (Addendum)
Ultrasound Guided Pigtail Chest Tube Insertion Procedure  Procedure Date:  02/23/2021  Time:  approx 1200  Procedure:  Right Pig Tail Placement  Assistant:  Judd Lien, MD  Diagnosis:  Hemothorax  Indication: Hemothorax  Description:  Informed consent was obtained and a surgical time out was performed to confirm the correct patient and procedure.  The patient was positioned and then etomidate/dilaudid was used for anesthesia/analgesia.  The site was identified using ultrasound and marked. The skin was prepped with chlorhexidine and 2% analgesia was used. The insertion needle was advanced through the 5th-6th intercostal space in the (anatomical location right mid-axillary line) into the pleural space. The wire was advanced through the needle and the needle removed. A small incision was made at the entry site of the guidewire. Then a dilator was advanced over the wire into the pleural space to facilitate advancement of catheter. The dilator was removed leaving the guidewire and the pigtail was advanced over the guidewire into the pleural space and the guidewire removed leaving the catheter in the pleural space.   The chest tube was connected to -20 cmH2O of wet suction.  Immediately 900 ml of dark, sanguinous fluid obtained. The tube was sutured and dressed in sterile fashion.  CXR was ordered for confirmation of placement.  Patient tolerated procedure without complications.   CXR reviewed by me.  Pigtail in place. No pneumothx appreciated  Lindalou Hose, MD 02/23/2021, 12:39   HVI CCM

## 2021-02-23 NOTE — OT Treatment (Signed)
OT attempted to provide care to [name] [name] today at 12:10. Care could not be delivered at that time due to patient participating in nursing care or procedure. Patient getting chest tube placed soon. Will attempt again when appropriate.   Trellis Moment, OT/L  [phone]

## 2021-02-23 NOTE — Sedation Documentation (Incomplete)
Procedural Sedation Note       Procedure Requiring Sedation:  Chest tube insertion (Right 60F pigtail catheter)    Sedation informed consent, pre-sedation risk assessment and evaluation completed. History of previous adverse experiences with sedation/analgesia/anesthesia assessed.  Appropriate Facility and Equipment compliant.        Pre-Procedure Information (Required)  Procedure Requiring Sedation: Chest tube insertion (Right 60F pigtail catheter)  Credential Provider: Dr. Acie Fredrickson  Allergies Verified?: Yes  Current Medications Reviewed: Yes  Previous Reaction to Sedation/Analgesia: No  Family History Related to Anesthesia: No  H&P On Chart and Reviewed (Done Within Last 24 Hours): Yes  ASA Classification: Severe Systemic Disease With Functional Limitation That is Not Incapacitating  Mallampati Airway Classification: Soft Palate and Base of Uvula Visualized  Site Marking (Required).  To be completed by Practitioner performing the surgery or procedure (click on all applicable boxes): Right  Anesthesia Plan: Moderate Sedation  Patient Is An Appropriate Candidate To Undergo the Planned Procedure, Sedation and Anesthesia: Yes                  Time-Out:  approx 1200  Timeout Documentation (use military time)  Patient Identification (name and date of birth):: Patient Verbally Identified Self        Complete Prior to Incision or Beginning of Procedure  Time Out Performed At:: [facility]  Unit: HVI      Sedation Type: Moderate Sedation      Medications (moderate): Other (dilaudid, etomidate)        IV Access:  IV Type: Central Line    Sedation, Procedure and Recovery Times:  SEDATION START TIME:     [id number]  SEDATION DATE:   02/23/21  PROCEDURE START TIME:  1200  PROCEDURE END TIME:  1215  SEDATION END/RECOVERY START TIME:  1215  SEDATION RECOVERY END TIME:  1215    Additional Interventions:  Additional Intervention needed:: No          Effect of administration of sedation:     Effects of  Administration: Successful sedation w/o adverse events                   The patient was continuously monitored throughout the procedure and in recovery. I was in attendance and supervised the sedation (during the start and stop times listed above) and remained immediately available until the patient returned to pre-procedure baseline.      See Sedation Documentation Timeline Report for Additional Information in Chart Review or Summary          Lindalou Hose, MD  HVI CCM

## 2021-02-23 NOTE — Progress Notes (Signed)
[facility]  HVI Critical Care Consult/Progress Note     [name] [name], [name] [name], 35 y.o. female  Date of Birth: [birth year]  Medical Record Number:  [id number]   Inpatient Admission Date: 02/05/2021   Hospital Day:  LOS: 0 days      Chief Complaint: Altered Mental Status   History of Present Illness: Per previous providers: "Pt is a 35 yo female with a significant PMH for Smoking and IVDU presented to [name] with AMS on 01/31/21. She was found to have a drug overdose, aspiration PNA requiring intubation, MRSA bacteremia, large TV endocarditis with moderate TR and elevated systemic pulmonary pressures (35mhg), AKI requiring HD, anemia requiring PRBC and thrombocytopenia. Urine drug screen + for heroin and methamphetamines.     [facility]."   POD #/ Procedure: 8/22: [facility]  8/30: On pump beating heart TVR (Epic 33)  PFO colsure   Hospital Course/Major events:  8/22: See above.   -Prop for sedation. Agitated; MAE; does not follow. Repeat CT Head/CAP. Hemodynamics stable. Intubated. Repeated blood cultures. Continue Vanc.   8/23: had pan CT 8/22, still not awakening/folowing commands on precedex  But agitated. MRI brain ordered, started on CRRT  8/24: remains on CRRT, MRI Brain no abnormality, following commands on dexmedetomidine, quetiapine, prn dilaudid  8/25 brocnh today for increased ETT secretions,plan for OR early next week, OMFS following for dental root extriction.  Pt self-extubated this afternoon.  Doing well on NC.  8/26: Intermittent delirium. Weak. Continue Seroquel.  Hemodynamics stable. NC. Aggressive pulm toilet. Continue nebs. Speech to see. Dental procedure today. Continues + BC. ID following; MRI CTLS ordered. Continue antibiotics. Surgical plan TBA.   8/27:  MRI completed today. Intermittent dex. Surgical plan TBA. HD today per nephology. Bedside speech and swallow. Blood  repeat Cx tomorrow.   8/28 repeat TTE today for OR later this week, HD holiday will give 200 mg Lasix for diuresis (followed by diuril 5077mif no response w/lasix) and D/C [place] for line holiday  8/29: OR tomorrow. Agitation. Seroquel. Hemodynamics stable. HD today prior to surgery. New line placed 2/2 line holiday. 2 u PRBC pre op optimization.   8/30: OR for TVR, PFO closure, out of OR intubated, levophed infusing, normal EF  8/31: OBT, deline, continue CT, HD today, repeat BC  9/1: hypertensive; started lopressor 6.2518mID, repeat BC, lasix 80 IV BID started, holding HD, repeat BC   9/2: Decrease Seroquel, increase BB, d/c pacer wires and groin bumper, lasix 200, Diuril 500, place trialysis line, give 1st dose coumadin tonight, repeat BC [facility]9/3: switched to daptomycin from vancomycin due to rash.   9/4: continues on line holiday, getting out of bed with no new complaints.   9/5:  Hyponatremic (120m25m  Fluid restriction.  Increase diuresis.  Added Sevelamer.  Received 1 dose of diuril, held 2/2 hyponatremia.  9/6: Continue Seroquel. PT/OT. INR for potential coumadin dose. Repeat H/H dose. Pulm toilet. Encourage PO intake. Bumex 2mg 64mfor diuresis. No dialysis indication today. Continue to monitor BMP (sodium). Salt tabs. Possible tx to SDS.   9/7: Increased Seroquel and melatonin at night. Hemodynamics stable. 1 u PRBC this am. Iron studies sent +, IV iron ordered q48 for 4 doses. Pulm toilet. Continue bumex. No indication for HD today. Continue anbx. PICC ordered for access. Tx to SDS.   9/8: Monitor right effusion, remains on RA. Continue diuresis with  Bumex 37m Q6. Continue ATB. F/u Derm regarding punch biopsy results, will start Pepcid for anti-histamine. Continue IV iron. Awaiting step down bed.   9/9: SDS orders remain. Hemodynamics stable. Pulm toilet + diuresis. Monitor BMP closely.    Past Medical History: No past medical history on file.    Past Surgical History: No past surgical history on file.    Social History:  Social History            Socioeconomic History   . Marital status: Divorced       Spouse name: Not on file   . Number of children: Not on file   . Years of education: Not on file   . Highest education level: Not on file   Occupational History   . Not on file   Tobacco Use   . Smoking status: Not on file   . Smokeless tobacco: Not on file   Substance and Sexual Activity   . Alcohol use: Not on file   . Drug use: Not on file   . Sexual activity: Not on file   Other Topics Concern   . Not on file   Social History Narrative   . Not on file      Social Determinants of Health      Financial Resource Strain: Not on file   Food Insecurity: Not on file   Transportation Needs: Not on file   Physical Activity: Not on file   Stress: Not on file   Intimate Partner Violence: Not on file   Housing Stability: Not on file       Family History: Family Medical History:       None                Allergies: No Known Allergies   ROS: Continue to have improved pain and shortness of breath.  No other new complaints       Problem List:       Patient Active Problem List   Diagnosis   . Endocarditis         Medications: Reviewed      Last VS:    .  Filed Vitals:    02/22/21 2100 02/22/21 2115 02/22/21 2330 02/23/21 0330   BP:  122/82 (!) 149/105 (!) 137/91   Pulse: 86 88 (!) 104 (!) 102   Resp: (!) 25 (!) 27 (!) 35 (!) 34   Temp:       SpO2: 97% 96% 98% 94%        Physical Exam  Vitals reviewed.   Constitutional:       Appearance: She is ill-appearing.      Comments:  Ill appearing female, no acute distress, becomes restless at times   HENT:      Head: Normocephalic.      Mouth/Throat:      Mouth: Mucous membranes are moist.   Eyes:       Pupils: Pupils are equal, round, and reactive to light.   Cardiovascular:      Rate and Rhythm: Regular rhythm.      Pulses: Normal pulses.   Pulmonary:      Comments: Diminished breath sounds     Abdominal:      General: Abdomen is flat. There is no distension.      Palpations: Abdomen  is soft. There is no mass.      Hernia: No hernia is present.      Comments:  +bowel sounds  Musculoskeletal:         General: Swelling present. Normal range of motion.      Cervical back: No rigidity.      Right lower leg: Trace Edema present.      Left lower leg: Trace Edema present.   Skin:     General: Skin is warm and dry. B/l lower extremities with palpable purpura   Neurological:      General: No focal deficit present.      Mental Status: She is alert and oriented to person, place, and time.   GCS 15  Psychiatric:      Comments: Anxious       [x]I have reviewed the patient's vitals signs, the nursing notes, the physician's notes and other progress notes      [x]I have reviewed the laboratory and imaging data        Pt was reviewed with CCM attending and SCT      Systems Based Assessment and Plan:     Neurologic:  Data/Assessment Reviewed    Diagnosis Altered mental status 2/2 encephalopathy   Overdose 2/2 IVDU  IVDA withdrawal  Back pain    Course:  improving    Plan Continue oral oxy   Continue Seroquel 160m QHS and melatonin       -Sleep hygiene; no labs until 0500 or if awake on own early morning; cluster care   MRI Brain 8/23 negative  MRI of the cervical, thoracic, and lumbosacral spine completed to r/o abscess; negative   Day/night policy, reorientation as needed  PT/OT/Cardiac rehab   Serial neuro checks      Cardiovascular:  Data/Assessment Most Recent Hemodynamics: Reviewed   Cardiovascular support: N/A       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; s/p PFO closure  Moderate TR  Mild Pulmonary HTN  Hypertension    Course Improving    Plan Continue lopressor 274mBID  Coumadin QHS  -----INR 2.74 today (2.53 yesterday) will discuss with sct about coumadin dose tonight  Continue ASA     Serial hemodynamics   Continue antibiotics       Respiratory:  Data/Assessment: CXR: Reviewed   Most recent VBG: reviewed  Vent Settings: NA    Diagnosis Acute respiratory failure 2/2 Aspiration PNA and  altered mental status  Septic pulmonary emboli  Smoker  MRSA PNA   Mechanical ventilation post op    Course stable   Plan FiO2 wean as tolerated for O2 sats>92%  Pulm toilet; scheduled nebs  Repeat CXR in am   Diuresis Bumex 25m25m6  Continue Rifampin     Renal:  Data/Assessment: Reviewed    Diagnosis Asymptomatic Moderate Hyponatremia (Hypotonic), likely 2/2 recent AKI/Renal failure  Acute kidney injury - multifactorial: septic shock 2/2 MRSA TV endocarditis, IVDU  Hypervolemia and anasarca   Course Improving   Plan AKI on CKD:   Nephrology following   Bumex 25mg36m  No indication for RRT at this time   Last HD (8/31)   fluid restriction 1 liter   9/8: DC salt tabs  Replacing electrolytes per order    Strict I&O  Renally adjust medications   Avoid nephrotoxic agents       Gastrointestinal:  Data/Assessment: Protonix for stress ulcer prophylaxis   Diet:  Taking PO     Bowel Regimen:  Senna  Miralax     Diagnosis Dysphagia  Enlarged liver  Distended gallbladder with mild elevation in Alk phos and bilirubin  constipation  Course  Stable    Plan +BM  Advance diet as tolerated   Continue stress ulcer prophylaxis   Speech/bedside to evaluate swallow; passed       Endocrine:  Data/Assessment Last three Fingerstick glucose: Reviewed   Last HbA1C: N/A  Most recent Thyroid results:  2.3      Diagnosis Hyperglycemia    Course Stable   Plan Target Goal 140-180  SSI Protocol PRN       Hematologic:  Data/Assessment:  Reviewed    Diagnosis Anemia of chronic disease and Thrombocytopenia  Iron deficiency Anemia   Course Stable   Plan SCDs (Sequential Compression Device)  For DVT Prophylaxis  Coumadin QHS for TV; dose dependent on INR; 4m 9/8   Continue to closely monitor cbc/coags     1u PRBC on 9/7; no indication for recheck unless indicated    Iron studies + iron deficiency anemia;  will do IV iron q48hrs for 4 doses; transition to oral after       Infectious Diseases:  Data/Assessment Tmax last 24 hrs: Reviewed        Diagnosis  MRSA TV Endocarditis  MRSA PNA    Course Stable    Plan Due to rash transitioned to daptomycin 865mkg q48h on 02/17/21  BAL 8/30 + MRSA   [facility]   [facility]  [facility]   BC 9/2 = NGTD     Derm consulted on 9/2 for B/L lower extremity purpura ; punch bx on RLE pending to r/o leukocytoclastic vasculitis 2/2 bacteremia; punch bx positive for leukocytoclastic. continue ointment and Pepcid     Antibiotic therapy  Agent:  Dapto/Rifampin 6wks from negative blood cultures end date 03/29/21  Indication: MRSA PNA and bacteremia      Will continue to monitor for additional s/s of infection         Family Communication and Disposition:  Data/Assessment Patient updated with plan of care. Transfer to step down.     I independently of the faculty provider spent a total of 30 minutes in direct/indirect care of this patient including initial evaluation, review of laboratory, radiology, diagnostic studies, review of medical record, order entry and coordination of care.  KeKathaleen BuryAPRN,AGACNP-BC  02/23/2021, 03:47

## 2021-02-24 ENCOUNTER — Inpatient Hospital Stay (HOSPITAL_COMMUNITY): Payer: Medicaid Other

## 2021-02-24 ENCOUNTER — Encounter (HOSPITAL_COMMUNITY): Payer: Self-pay | Admitting: THORACIC SURGERY CARDIOTHORACIC VASCULAR SURGERY

## 2021-02-24 DIAGNOSIS — Z9689 Presence of other specified functional implants: Secondary | ICD-10-CM

## 2021-02-24 DIAGNOSIS — J90 Pleural effusion, not elsewhere classified: Secondary | ICD-10-CM

## 2021-02-24 DIAGNOSIS — Z8774 Personal history of (corrected) congenital malformations of heart and circulatory system: Secondary | ICD-10-CM

## 2021-02-24 DIAGNOSIS — J9811 Atelectasis: Secondary | ICD-10-CM

## 2021-02-24 DIAGNOSIS — Z959 Presence of cardiac and vascular implant and graft, unspecified: Secondary | ICD-10-CM

## 2021-02-24 DIAGNOSIS — J984 Other disorders of lung: Secondary | ICD-10-CM

## 2021-02-24 LAB — COOMBS, DIRECT: DAT POLYSPECIFIC: NEGATIVE

## 2021-02-24 LAB — GIEMSA STAIN (CBC W/DIFF), WITH PATHOLOGIST REVIEW
BASOPHIL #: 0.11 10*3/uL (ref <=0.20)
BASOPHIL %: 1 %
EOSINOPHIL #: <0.1 10*3/uL (ref <=0.50)
EOSINOPHIL %: 0 %
HCT: 24.4 % — ABNORMAL LOW (ref 34.8–46.0)
HGB: 8.1 g/dL — ABNORMAL LOW (ref 11.5–16.0)
IMMATURE GRANULOCYTE #: 0.35 10*3/uL — ABNORMAL HIGH (ref <0.10)
IMMATURE GRANULOCYTE %: 3 % — ABNORMAL HIGH (ref 0–1)
LYMPHOCYTE #: 2.02 10*3/uL (ref 1.00–4.80)
LYMPHOCYTE %: 17 %
MCH: 26.8 pg (ref 26.0–32.0)
MCHC: 33.2 g/dL (ref 31.0–35.5)
MCV: 81.5 fL (ref 78.0–100.0)
MONOCYTE #: 0.84 10*3/uL (ref 0.20–1.10)
MONOCYTE %: 7 %
MPV: 8.4 fL — ABNORMAL LOW (ref 8.7–12.5)
NEUTROPHIL #: 8.61 10*3/uL — ABNORMAL HIGH (ref 1.50–7.70)
NEUTROPHIL %: 72 %
PLATELETS: 445 10*3/uL — ABNORMAL HIGH (ref 150–400)
RBC: 3.02 10*6/uL — ABNORMAL LOW (ref 3.85–5.22)
RDW-CV: 18.3 % — ABNORMAL HIGH (ref 11.5–15.5)
WBC: 12 10*3/uL — ABNORMAL HIGH (ref 3.7–11.0)

## 2021-02-24 LAB — BASIC METABOLIC PANEL
ANION GAP: 9 mmol/L (ref 4–13)
ANION GAP: 11 mmol/L (ref 4–13)
BUN/CREA RATIO: 32 — ABNORMAL HIGH (ref 6–22)
BUN/CREA RATIO: 27 — ABNORMAL HIGH (ref 6–22)
BUN: 59 mg/dL — ABNORMAL HIGH (ref 8–25)
BUN: 54 mg/dL — ABNORMAL HIGH (ref 8–25)
CALCIUM: 8.6 mg/dL (ref 8.5–10.0)
CALCIUM: 8.8 mg/dL (ref 8.5–10.0)
CHLORIDE: 102 mmol/L (ref 96–111)
CHLORIDE: 101 mmol/L (ref 96–111)
CO2 TOTAL: 20 mmol/L — ABNORMAL LOW (ref 22–30)
CO2 TOTAL: 18 mmol/L — ABNORMAL LOW (ref 22–30)
CREATININE: 1.84 mg/dL — ABNORMAL HIGH (ref 0.60–1.05)
CREATININE: 1.98 mg/dL — ABNORMAL HIGH (ref 0.60–1.05)
ESTIMATED GFR: 36 mL/min/BSA — ABNORMAL LOW (ref >=60)
ESTIMATED GFR: 33 mL/min/BSA — ABNORMAL LOW (ref >=60)
GLUCOSE: 97 mg/dL (ref 65–125)
GLUCOSE: 106 mg/dL (ref 65–125)
POTASSIUM: 4 mmol/L (ref 3.5–5.1)
POTASSIUM: 4.4 mmol/L (ref 3.5–5.1)
SODIUM: 131 mmol/L — ABNORMAL LOW (ref 136–145)
SODIUM: 130 mmol/L — ABNORMAL LOW (ref 136–145)

## 2021-02-24 LAB — CREATINE KINASE (CK), TOTAL, SERUM: CREATINE KINASE: 13 U/L — ABNORMAL LOW (ref 25–190)

## 2021-02-24 LAB — PT/INR
INR: 2.28 — ABNORMAL HIGH (ref 0.80–1.20)
PROTHROMBIN TIME: 26.7 seconds — ABNORMAL HIGH (ref 9.1–13.9)

## 2021-02-24 LAB — CBC WITH DIFF
BASOPHIL #: <0.1 10*3/uL (ref <=0.20)
BASOPHIL %: 1 %
EOSINOPHIL #: 0.16 10*3/uL (ref <=0.50)
EOSINOPHIL %: 2 %
HCT: 20.1 % — ABNORMAL LOW (ref 34.8–46.0)
HGB: 6.7 g/dL — CL (ref 11.5–16.0)
IMMATURE GRANULOCYTE #: 0.28 10*3/uL — ABNORMAL HIGH (ref <0.10)
IMMATURE GRANULOCYTE %: 3 % — ABNORMAL HIGH (ref 0–1)
LYMPHOCYTE #: 2.06 10*3/uL (ref 1.00–4.80)
LYMPHOCYTE %: 21 %
MCH: 27.3 pg (ref 26.0–32.0)
MCHC: 33.3 g/dL (ref 31.0–35.5)
MCV: 82 fL (ref 78.0–100.0)
MONOCYTE #: 0.77 10*3/uL (ref 0.20–1.10)
MONOCYTE %: 8 %
MPV: 8 fL — ABNORMAL LOW (ref 8.7–12.5)
NEUTROPHIL #: 6.57 10*3/uL (ref 1.50–7.70)
NEUTROPHIL %: 65 %
PLATELETS: 345 10*3/uL (ref 150–400)
RBC: 2.45 10*6/uL — ABNORMAL LOW (ref 3.85–5.22)
RDW-CV: 18.8 % — ABNORMAL HIGH (ref 11.5–15.5)
WBC: 9.9 10*3/uL (ref 3.7–11.0)

## 2021-02-24 LAB — TYPE AND SCREEN
ABO/RH(D): O POS
ANTIBODY SCREEN: NEGATIVE

## 2021-02-24 LAB — CROSSMATCH RED CELLS - UNITS
UNIT DIVISION: 0
UNIT DIVISION: 0

## 2021-02-24 LAB — H & H
HCT: 20.2 % — ABNORMAL LOW (ref 34.8–46.0)
HCT: 24.4 % — ABNORMAL LOW (ref 34.8–46.0)
HGB: 6.6 g/dL — CL (ref 11.5–16.0)
HGB: 8.1 g/dL — ABNORMAL LOW (ref 11.5–16.0)

## 2021-02-24 LAB — PHOSPHORUS
PHOSPHORUS: 7.1 mg/dL — ABNORMAL HIGH (ref 2.4–4.7)
PHOSPHORUS: 6.8 mg/dL — ABNORMAL HIGH (ref 2.4–4.7)

## 2021-02-24 LAB — LDH: LDH: 328 U/L — ABNORMAL HIGH (ref 125–220)

## 2021-02-24 LAB — MAGNESIUM
MAGNESIUM: 2.6 mg/dL (ref 1.8–2.6)
MAGNESIUM: 2.5 mg/dL (ref 1.8–2.6)

## 2021-02-24 MED ORDER — BUMETANIDE 0.25 MG/ML INJECTION SOLUTION
1.0000 mg | Freq: Three times a day (TID) | INTRAMUSCULAR | Status: DC
Start: 2021-02-24 — End: 2021-02-27
  Administered 2021-02-24 – 2021-02-27 (×10): 1 mg via INTRAVENOUS
  Filled 2021-02-24 (×12): qty 4

## 2021-02-24 MED ORDER — ALBUMIN, HUMAN 25 % INTRAVENOUS SOLUTION
25.0000 g | Freq: Three times a day (TID) | INTRAVENOUS | Status: AC
Start: 2021-02-24 — End: 2021-02-25
  Administered 2021-02-24: 25 g via INTRAVENOUS
  Administered 2021-02-24 (×2): 0 g via INTRAVENOUS
  Administered 2021-02-24: 25 g via INTRAVENOUS
  Administered 2021-02-25: 0 g via INTRAVENOUS
  Administered 2021-02-25: 25 g via INTRAVENOUS
  Filled 2021-02-24 (×3): qty 100

## 2021-02-24 MED ORDER — SODIUM CHLORIDE 0.9 % IV BOLUS
40.0000 mL | INJECTION | Freq: Once | Status: AC | PRN
Start: 2021-02-24 — End: 2021-02-24

## 2021-02-24 MED ORDER — WARFARIN 1 MG TABLET
0.5000 mg | ORAL_TABLET | Freq: Once | ORAL | Status: AC
Start: 2021-02-24 — End: 2021-02-24
  Administered 2021-02-24 (×2): 0.5 mg via ORAL
  Filled 2021-02-24: qty 0.5

## 2021-02-24 MED ORDER — SEVELAMER CARBONATE 0.8 GRAM ORAL POWDER PACKET
2400.0000 mg | Freq: Three times a day (TID) | ORAL | Status: DC
Start: 2021-02-24 — End: 2021-03-02
  Administered 2021-02-24 – 2021-02-27 (×8): 2400 mg via ORAL
  Administered 2021-02-27: 0 mg via ORAL
  Administered 2021-02-27: 2400 mg via ORAL
  Administered 2021-02-28: 0 mg via ORAL
  Administered 2021-02-28 – 2021-03-01 (×5): 2400 mg via ORAL
  Administered 2021-03-02: 0 mg via ORAL
  Administered 2021-03-02: 2400 mg via ORAL
  Filled 2021-02-24 (×19): qty 3

## 2021-02-24 NOTE — Ancillary Notes (Signed)
Visited patient for ambulation, patient refused stating she was in 10/10 pain following chest tube placement and did not wish to walk.  Educated patient on importance of ambulation.    Patient voices understanding and continues to refuse at this time.    Patient reminded that they may contact nursing/[facility] staff to ambulate.    Will follow-up with patient for further cardiac rehab services.    Patient was in chair with call bell within reach.      [facility], ES  Cardiac Rehabilitation  [phone]

## 2021-02-24 NOTE — Nurses Notes (Signed)
Bed received on 10SE. APP ludwick informed and okay for transfer with blood transfusion running. Patient transferred to 10SE bed 29. RN on 10SE assumed care of patient.

## 2021-02-24 NOTE — Care Plan (Addendum)
POD # 11 TVR/ PFO closure.   IV antibiotics continues for endocarditis.   Repeat HGB after blood transfusion 8.1.  Pt up with stand by.  Right pleural pigtail to -20 dry sx.  Dressing changed today. Minimal SS drainage.  Pain controlled with PO and IV pain meds  No BM since 9/7, refused stool softeners this morning but took them this afternoon.   Educated on call system and call light in reach      Problem: Adult Inpatient Plan of Care  Goal: Plan of Care Review  Outcome: Ongoing (see interventions/notes)  Goal: Patient-Specific Goal (Individualized)  Outcome: Ongoing (see interventions/notes)  Goal: Absence of Hospital-Acquired Illness or Injury  Outcome: Ongoing (see interventions/notes)  Goal: Optimal Comfort and Wellbeing  Outcome: Ongoing (see interventions/notes)  Goal: Rounds/Family Conference  Outcome: Ongoing (see interventions/notes)     Problem: Skin Injury Risk Increased  Goal: Skin Health and Integrity  Outcome: Ongoing (see interventions/notes)     Problem: Infection Progression (Sepsis)  Goal: Absence of Infection Signs and Symptoms  Outcome: Ongoing (see interventions/notes)     Problem: Fall Injury Risk  Goal: Absence of Fall and Fall-Related Injury  Outcome: Ongoing (see interventions/notes)     Problem: Acute Rehab Services Goal & Intervention Plan  Goal: Gait Training Goal  Description: Stand Alone Therapy Goal  Outcome: Ongoing (see interventions/notes)  Goal: Interprofessional Goal  Description: Stand Alone Therapy Goal  Outcome: Ongoing (see interventions/notes)  Goal: Occupational Therapy Goals  Description: Stand Alone Therapy Goal  Outcome: Ongoing (see interventions/notes)  Goal: Physical Therapy Goal  Description: Stand Alone Therapy Goal  Outcome: Ongoing (see interventions/notes)     Problem: Pain Acute  Goal: Acceptable Pain Control and Functional Ability  Outcome: Ongoing (see interventions/notes)     Problem: Pain (Cardiovascular Surgery)  Goal: Acceptable Pain Control  Outcome:  Ongoing (see interventions/notes)     Problem: Behavioral Health Comorbidity  Goal: Maintenance of Behavioral Health Symptom Control  Outcome: Ongoing (see interventions/notes)

## 2021-02-24 NOTE — Care Plan (Signed)
[name] [name] required a blood transfusion today for hemoglobin of 6.6. Her pigtail that was placed yesterday has had minimal output. She has required significant pain medicine due to increased pain from her new CT.    Problem: Adult Inpatient Plan of Care  Goal: Plan of Care Review  Outcome: Ongoing (see interventions/notes)  Flowsheets (Taken 02/23/2021 2200)  Plan of Care Reviewed With: patient  Goal: Patient-Specific Goal (Individualized)  Outcome: Ongoing (see interventions/notes)  Flowsheets (Taken 02/23/2021 2200)  Individualized Care Needs: Pain medication as scheduled  Anxieties, Fears or Concerns: Complains of increased pain since CT insertion  Patient-Specific Goals (Include Timeframe): Transfer to stepdown unit when bed available  Goal: Absence of Hospital-Acquired Illness or Injury  Outcome: Ongoing (see interventions/notes)  Intervention: Identify and Manage Fall Risk  Recent Flowsheet Documentation  Taken 02/24/2021 0600 by Jefm Bryant, RN  Safety Promotion/Fall Prevention: safety round/check completed  Taken 02/24/2021 0500 by Jefm Bryant, RN  Safety Promotion/Fall Prevention: safety round/check completed  Taken 02/24/2021 0400 by Jefm Bryant, RN  Safety Promotion/Fall Prevention: safety round/check completed  Taken 02/24/2021 0300 by Jefm Bryant, RN  Safety Promotion/Fall Prevention: safety round/check completed  Taken 02/24/2021 0200 by Jefm Bryant, RN  Safety Promotion/Fall Prevention: safety round/check completed  Taken 02/24/2021 0100 by Jefm Bryant, RN  Safety Promotion/Fall Prevention: safety round/check completed  Taken 02/24/2021 0000 by Jefm Bryant, RN  Safety Promotion/Fall Prevention: safety round/check completed  Taken 02/23/2021 2300 by Jefm Bryant, RN  Safety Promotion/Fall Prevention: safety round/check completed  Taken 02/23/2021 2200 by Jefm Bryant, RN  Safety Promotion/Fall Prevention:   safety round/check completed   nonskid shoes/slippers when out of  bed   motion sensor pad activated   muscle strengthening facilitated   fall prevention program maintained   activity supervised  Intervention: Prevent Skin Injury  Recent Flowsheet Documentation  Taken 02/23/2021 2200 by Jefm Bryant, RN  Body Position:   fowlers ( 45-60 degrees)   lower extremity elevated, right   lower extremity elevated, left   upper extremity elevated, left   upper extremity elevated, right  Intervention: Prevent and Manage VTE (Venous Thromboembolism) Risk  Recent Flowsheet Documentation  Taken 02/23/2021 2200 by Jefm Bryant, RN  VTE Prevention/Management:   ambulation promoted   dorsiflexion/plantar flexion performed   anticoagulant therapy maintained  Goal: Optimal Comfort and Wellbeing  Outcome: Ongoing (see interventions/notes)  Intervention: Provide Person-Centered Care  Recent Flowsheet Documentation  Taken 02/23/2021 2200 by Jefm Bryant, RN  Trust Relationship/Rapport:   care explained   choices provided   emotional support provided   empathic listening provided   questions answered   questions encouraged   reassurance provided   thoughts/feelings acknowledged  Goal: Rounds/Family Conference  Outcome: Ongoing (see interventions/notes)     Problem: Skin Injury Risk Increased  Goal: Skin Health and Integrity  Outcome: Ongoing (see interventions/notes)  Intervention: Optimize Skin Protection  Recent Flowsheet Documentation  Taken 02/23/2021 2200 by Jefm Bryant, RN  Pressure Reduction Techniques:   frequent weight shift encouraged   (L,M,H,VH) Manage Moisture, Nutrition & Shear   (L,M,H,VH) Maximal Remobilization  Pressure Reduction Devices:   (L.M,H,VH) Pressure redistributing mattress utilized   (L.M,H,VH) Specialty Bed utilized   (L.M,H,VH) Heel offloading device utilized   (L.M,H,VH) Pressure Redistributing Chair cushion utilized   (M,H,VH) Use Repositioning Devices or Pillows  Skin Protection:   adhesive use limited   antimicrobial wipes   electrode sites changed    incontinence pads utilized   pulse oximeter probe site changed   preventative  decubiti skin protection foam dressing applied/intact   skin-to-device areas padded   tubing/devices free from skin contact  Head of Bed (HOB) Positioning: HOB at 30-45 degrees     Problem: Infection Progression (Sepsis)  Goal: Absence of Infection Signs and Symptoms  Outcome: Ongoing (see interventions/notes)  Intervention: Initiate Sepsis Management  Recent Flowsheet Documentation  Taken 02/23/2021 2200 by Jefm Bryant, RN  Isolation Precautions: contact precautions maintained  Intervention: Promote Stabilization  Recent Flowsheet Documentation  Taken 02/23/2021 2200 by Jefm Bryant, RN  Fever Reduction/Comfort Measures: lightweight bedding  Intervention: Promote Recovery  Recent Flowsheet Documentation  Taken 02/24/2021 0400 by Jefm Bryant, RN  Activity Management:   ambulated to bathroom   returned to bed  Taken 02/23/2021 2200 by Jefm Bryant, RN  Sleep/Rest Enhancement:   regular sleep/rest pattern promoted   consistent schedule promoted   room darkened  Activity Management:   activity adjusted per tolerance   activity encouraged   ambulated to bathroom   returned to bed   ROM, active encouraged     Problem: Fall Injury Risk  Goal: Absence of Fall and Fall-Related Injury  Outcome: Ongoing (see interventions/notes)  Intervention: Identify and Manage Contributors  Recent Flowsheet Documentation  Taken 02/23/2021 2200 by Jefm Bryant, RN  Self-Care Promotion:   independence encouraged   BADL personal objects within reach   BADL personal routines maintained   safe use of adaptive equipment encouraged  Intervention: Promote Injury-Free Environment  Recent Flowsheet Documentation  Taken 02/24/2021 0600 by Jefm Bryant, RN  Safety Promotion/Fall Prevention: safety round/check completed  Taken 02/24/2021 0500 by Jefm Bryant, RN  Safety Promotion/Fall Prevention: safety round/check completed  Taken 02/24/2021 0400 by  Jefm Bryant, RN  Safety Promotion/Fall Prevention: safety round/check completed  Taken 02/24/2021 0300 by Jefm Bryant, RN  Safety Promotion/Fall Prevention: safety round/check completed  Taken 02/24/2021 0200 by Jefm Bryant, RN  Safety Promotion/Fall Prevention: safety round/check completed  Taken 02/24/2021 0100 by Jefm Bryant, RN  Safety Promotion/Fall Prevention: safety round/check completed  Taken 02/24/2021 0000 by Jefm Bryant, RN  Safety Promotion/Fall Prevention: safety round/check completed  Taken 02/23/2021 2300 by Jefm Bryant, RN  Safety Promotion/Fall Prevention: safety round/check completed  Taken 02/23/2021 2200 by Jefm Bryant, RN  Safety Promotion/Fall Prevention:   safety round/check completed   nonskid shoes/slippers when out of bed   motion sensor pad activated   muscle strengthening facilitated   fall prevention program maintained   activity supervised     Problem: Pain Acute  Goal: Acceptable Pain Control and Functional Ability  Outcome: Ongoing (see interventions/notes)  Intervention: Prevent or Manage Pain  Recent Flowsheet Documentation  Taken 02/23/2021 2200 by Jefm Bryant, RN  Sleep/Rest Enhancement:   regular sleep/rest pattern promoted   consistent schedule promoted   room darkened  Intervention: Optimize Psychosocial Wellbeing  Recent Flowsheet Documentation  Taken 02/23/2021 2200 by Jefm Bryant, RN  Diversional Activities:   television   smartphone     Problem: Pain (Cardiovascular Surgery)  Goal: Acceptable Pain Control  Outcome: Ongoing (see interventions/notes)     Problem: Behavioral Health Comorbidity  Goal: Maintenance of Behavioral Health Symptom Control  Outcome: Ongoing (see interventions/notes)

## 2021-02-24 NOTE — Nurses Notes (Signed)
Patient noted to be tachypneic with RR 30s-40's after initiation of blood transfusion. Patient stated she does not feel short of breath, but is having severe increased pain around right pleural chest tube after recent walk to bathroom. Lung sounds with coarse crackles to right lower lobe and faint crackles to left lower lobe. APP Ludwick informed. Blood transfusion rate decreased from giving over 3 hours to giving transfusion over four hours. Patient also given early dose of PRN dilaudid per APP Ludwick. RR decreased to 20s to 30s after dose of dilaudid.

## 2021-02-24 NOTE — Progress Notes (Signed)
[facility]  Cardiac Surgery   Floor/Stepdown Progress Note      [name] [name], [name] [name]  Date of Admission:  02/05/2021  Date of Birth:  05-Jan-1986    Hospital Day:  LOS: 19 days   Post-op Day:  11 Days Post-Op, S/P On pump beating heart TVR (33 mm Epic) with closure of PFO  Date of Service:  02/24/2021    SUBJECTIVE: Patient transferred up from the CVICU during AM rounds. Pigtail to remain to suction despite minimal output per SCT attending. Weaning diuresis. I/O continue to be inaccurate as patient is not able to catch void in hat well. Reports significant pain to Pigtail insertion site with plans to leave current pain regimen until Pigtail can be removed. Denies chest pain, SOB, dizziness, palpitations, or N/V. Not documented to have postoperative bowel movement but reports positive flatus. Continue to assess for Optimal Trial enrollment.     OBJECTIVE:    Vital Signs:  Temp (24hrs) Max:36.9 C (98.4 F)      Temperature: 36.8 C (98.3 F) (02/24/21 1539)  BP (Non-Invasive): 118/81 (02/24/21 1545)  MAP (Non-Invasive): 92 mmHG (02/24/21 1545)  Heart Rate: 85 (02/24/21 1545)  Respiratory Rate: (!) 40 (02/24/21 1545)  SpO2: 98 % (02/24/21 1545)    Base (Admission) Weight:  Weight: 72.4 kg (159 lb 9.8 oz)  Weight:  Weight: 67 kg (147 lb 11.3 oz)    Current Inpatient Medications:  acetaminophen (TYLENOL) tablet, 650 mg, Oral, Q4H PRN  albumin human (ALBUMINAR) 25% premix infusion, 25 g, Intravenous, Q8H  aspirin chewable tablet 81 mg, 81 mg, Oral, Daily  B complex-vitamin C-folic acid (NEPHROCAPS) capsule, 1 Capsule, Oral, Daily  bisacodyl (DULCOLAX) rectal suppository, 10 mg, Rectal, Daily PRN  bumetanide (BUMEX) 0.25 mg/mL injection, 1 mg, Intravenous, Q8H  calcium carbonate (TUMS) '500mg'$  ('200mg'$  elemental calcium) chewable tablet, 500 mg, Oral, 3x/day PRN  DAPTOmycin (CUBICIN) 550 mg in NS 50 mL IVPB, 8 mg/kg, Intravenous, Q24H  famotidine (PEPCID) tablet, 20 mg, Oral, Daily  ferric gluconate (FERRLECIT) 250 mg in  NS 100 mL IVPB, 250 mg, Intravenous, Q48H  hydrALAZINE (APRESOLINE) injection 10 mg, 10 mg, Intravenous, Q4H PRN  HYDROmorphone (DILAUDID) 1 mg/mL injection, 0.4 mg, Intravenous, Q4H PRN  ipratropium (ATROVENT) 0.02% nebulizer solution, 0.5 mg, Nebulization, Q4H PRN  levalbuterol (XOPENEX) 1.25 mg/ 0.5 mL nebulizer solution, 1.25 mg, Nebulization, Q4H PRN  lidocaine-menthol (LIDOPATCH) 3.6%-1.25% patch, 1 Patch, Transdermal, Daily  lidocaine-menthol (LIDOPATCH) 3.6%-1.25% patch, 1 Patch, Transdermal, Daily  lidocaine-menthol (LIDOPATCH) 3.6%-1.25% patch, 1 Patch, Transdermal, Daily  magnesium hydroxide (MILK OF MAGNESIA) '400mg'$  per 75m oral liquid, 30 mL, Oral, Daily PRN  melatonin tablet, 6 mg, Oral, NIGHTLY  metoprolol tartrate (LOPRESSOR) tablet, 25 mg, Oral, 2x/day  miconazole nitrate (SECURA) 2% topical cream, , Apply Topically, 2x/day  nicotine polacrilex (COMMIT) lozenge, 2 mg, Oral, Q2H PRN  NS flush syringe, 10-30 mL, Intracatheter, Q8HRS  NS flush syringe, 20-30 mL, Intracatheter, Q1 MIN PRN  ondansetron (ZOFRAN) 2 mg/mL injection, 4 mg, Intravenous, Q8H PRN  oxyCODONE concentrate (ROXICODONE INTENSOL) 10 mg per 0.5 mL oral liquid, 10 mg, Oral, Q6H PRN   Or  oxyCODONE concentrate (ROXICODONE INTENSOL) 10 mg per 0.5 mL oral liquid, 5 mg, Oral, Q6H PRN  phenol (CHLORASEPTIC) 1.4% oromucosal spray, 1 Spray, Mouth/Throat, Q4H PRN  polyethylene glycol (MIRALAX) oral packet, 34 g, Oral, Daily  QUEtiapine (SEROQUEL) tablet, 100 mg, Oral, NIGHTLY  rifAMPin (RIFADIN) capsule, 300 mg, Oral, 2x/day  sennosides-docusate sodium (SENOKOT-S) 8.6-'50mg'$  per tablet, 2 Tablet, Oral, 3x/day  sevelamer carbonate (RENVELA) oral powder, 2,400 mg, Oral, 3x/day-Meals  sodium chloride 3% nebulizer solution, 3 mL, Nebulization, Q6H PRN  sodium citrate 4% (3 mL) injection syringe, 2 Syringe, Intracatheter, Q1H PRN  triamcinolone acetonide (ARISTOCORT A) 0.1% topical ointment, , Apply Topically, 2x/day  warfarin (COUMADIN) tablet, 0.5  mg, Oral, Once 2100  Warfarin Placeholder, , Does not apply, Daily PRN      Appropriate Home Meds restarted:  Yes    I/O:  I/O last 24 hours to current time:      Intake/Output Summary (Last 24 hours) at 02/24/2021 1718  Last data filed at 02/24/2021 1435  Gross per 24 hour   Intake 927 ml   Output 1135 ml   Net -208 ml     I/O last 3 completed shifts:  In: 6 [P.O.:690; I.V.:100; Other:180]  Out: 1925 [Urine:875; Chest Tube:1050]    Nutrition/Diet:  MNT PROTOCOL FOR DIETITIAN  DIET RENAL (60G PRO,2G NA,2G K) Restrict fluids to: 1500 ML  DIETARY ORAL SUPPLEMENTS Oral Supplements with tray: Magic Cup-Orange; LUNCH/DINNER; 1 Each    Hardware (foley):   Date Placed Necessity Reviewed  Date Discontinued    Foley (out day number two)                      Labs:  Reviewed:  I have reviewed all lab results.  Ordered:  Daily CBC, BMP, Magnesium, and PT/INR  Anticoagulation:  Yes  Target INR:  2.0-3.0   Follow Up Arranged:  No    Radiology:    Reviewed:  I have personally reviewed all current imaging   Ordered:  Daily CXR    Microbiology:  Reviewed   - Sterile site culture (9/9): NGTD  - Postoperative BCX (9/2): Negative for over 5 days    Physical Exam:    Constitutional:  appears chronically ill, acutely ill, appears older than stated age, flushed, cachectic, mild distress and vital signs reviewed and discussed with patient  Eyes:  Pupils equal and round.   ENT:  Mouth mucous membranes dry.   Neck:  supple, symmetrical, trachea midline  Respiratory:  decreased breath sounds bibasilar  Cardiovascular:  regularly irregular rhythm  S1, S2 normal  no click present  no murmurs  pedal edema 1+ generalized  Gastrointestinal:  Soft, non-tender, Bowel sounds normal  Genitourinary:  Deferred  Musculoskeletal:  Head atraumatic and normocephalic and AROM  Integumentary:  Skin warm and dry and Anterior midline surgical incision OTA - edges well approximated without signs of infection, negative for sternal click  Neurologic:  Alert and  oriented x3  Psychiatric:  Anxious    ASSESSMENT/PLAN:  11 Days Post-Op On pump beating heart TVR (33 mm Epic) with closure of PFO    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 / S/P PFO closure  - ASA for cardiac and Tissue TVR core measures  - BB: Lopressor 25 mg BID  - Keep K > 4.0 and Mag > 2.0  - Surgical chest tubes removed 9/1  - Bumper stitch: N/A  - Epicardial Wires: Left removed and right clipped in CVICU  - Anticoagulation: Coumadin Management for Tissue TVR with INR goal 2.0-3.0   - Last INR: 2.28   - Coumadin Dose: 0.5 mg  - Diuresis: Bumex decreased to 1 mg Q 8 hours as significant improvement to peripheral edema   - Given with Albumin for 3 doses   - Post-op EF: Normal Bi-V function    Respiratory: Atelectasis / Right Hemathorax  S/P Pigtail Insertion on 9/9 - stable, improved  - Use IS and PEP  - Wean oxygen to maintain 02 Sat > 90%  - Maintain Pigtail Chest tube to - 20 mmHg suction    - Plan to remove in AM if minimal output   - Up and ambulate  - Nebs PRN  - Daily CXR    Renal/GU/Endocrine: Postoperative Hyperglycemia - resolved / AKI requiring Pre/Post HD  - Last Cr : 1.98 (1.84)  - Avoid Nephrotoxic Agents   - See diuresis plan outlined in Cardiac Section above  - Nephrology following for HD needs with last HD on 8/31  - A1C: 5.8  - SSI Protocol - Conservative Scale AC/HS/PRN  - Continue to follow 24 hour insulin trends to determine need for dose adjustment    Gastrointestinal: GI Ulcer PPX  - Diet: MNT PROTOCOL FOR DIETITIAN  DIET RENAL (60G PRO,2G NA,2G K) Restrict fluids to: 1500 ML  DIETARY ORAL SUPPLEMENTS Oral Supplements with tray: Magic Cup-Orange; LUNCH/DINNER; 1 Each  - Bowel Regimen: Colace and Miralax   - PRN: Milk of Mag and Dulcolax Suppository  - Post-op BM : Negative   - PPI: Protonix 40 mg PO daily    - To be continued for one month upon discharge     ID: MRSA Bacteremia / Endocarditis   - Antibiotics: Daptomycin and Rifampin   - Continue to monitor for worsening  signs of infection, fevers, or leukocytosis  - Optimal Trial discussed with patient with decent candidacy with Infusion Service attending to discuss with service this up coming Monday    Heme: Preoperative Thrombocytopenia / Idiopathic Postoperative Anemia - Stable, improving    - Monitor CBC for acute surgical blood loss anemia and thrombocytopenia  - Blood Products: Transfused 1 unit of PRBC today for AM Hgb of 6.6   - Repeat Hgb post transfusion > 8.0  - Anticoagulant: See Cardiac Section for Coumadin Mgmt    Neuro/Psych: Postoperative Surgical Pain / IVDU / Smoker ? Anxiety   - PRN acetaminophen for mild pain / fevers  - PRN Pain Meds: PO Oxycodone 5 mg / 10 mg   - IV Dilaudid for break through pain    - Will wean once Pigtail can be removed  - Continue Seroquel HS for sleep  - Continue scheduled Melatonin for sleep  - Routine neuro exams  - Day / Night hygiene   - PT / OT/ OOB     Consults: None    D/C Dispo: Pending Infusion Service vs Optimal Trial     Problem List:  Active Hospital Problems    Diagnosis   . Endocarditis       PT/OT: Yes    DVT RISK FACTORS HAVE BEN ASSESSED AND PROPHYLAXIS ORDERED (SEE RUBYONLINE - REFERENCE TOOLS - MD, DVT PROPHY OR POCKET CARD):  YES    Disposition Planning: Home with Optimal Trial vs Infusion service to complete ABX     E&M Attestation:  I independently of the faculty provider spent a total of (28) minutes in direct/indirect care of this patient including initial evaluation, review of laboratory, radiology, diagnostic studies, review of medical record, order entry and coordination of care.     Patient was seen on rounds with Dr. Carver Fila with inaptient plan above discussed and formulated by SCT and [facility].    Garrison Columbus, APRN,AGACNP-BC 02/24/2021 17:18

## 2021-02-24 NOTE — Nurses Notes (Signed)
Patient hemoglobin and hematocrit returned as 6.7 and 20.1. APP [place] informed. H/H recheck ordered and sent to lab.

## 2021-02-25 ENCOUNTER — Inpatient Hospital Stay (HOSPITAL_COMMUNITY): Payer: Medicaid Other

## 2021-02-25 DIAGNOSIS — Z978 Presence of other specified devices: Secondary | ICD-10-CM

## 2021-02-25 DIAGNOSIS — J811 Chronic pulmonary edema: Secondary | ICD-10-CM

## 2021-02-25 DIAGNOSIS — R918 Other nonspecific abnormal finding of lung field: Secondary | ICD-10-CM

## 2021-02-25 DIAGNOSIS — J9811 Atelectasis: Secondary | ICD-10-CM

## 2021-02-25 DIAGNOSIS — Z954 Presence of other heart-valve replacement: Secondary | ICD-10-CM

## 2021-02-25 LAB — BASIC METABOLIC PANEL
ANION GAP: 11 mmol/L (ref 4–13)
BUN/CREA RATIO: 33 — ABNORMAL HIGH (ref 6–22)
BUN: 59 mg/dL — ABNORMAL HIGH (ref 8–25)
CALCIUM: 8.5 mg/dL (ref 8.5–10.0)
CHLORIDE: 106 mmol/L (ref 96–111)
CO2 TOTAL: 18 mmol/L — ABNORMAL LOW (ref 22–30)
CREATININE: 1.81 mg/dL — ABNORMAL HIGH (ref 0.60–1.05)
ESTIMATED GFR: 37 mL/min/BSA — ABNORMAL LOW (ref >=60)
GLUCOSE: 97 mg/dL (ref 65–125)
POTASSIUM: 3.9 mmol/L (ref 3.5–5.1)
SODIUM: 135 mmol/L — ABNORMAL LOW (ref 136–145)

## 2021-02-25 LAB — PT/INR
INR: 1.65 — ABNORMAL HIGH (ref 0.80–1.20)
PROTHROMBIN TIME: 19.2 seconds — ABNORMAL HIGH (ref 9.1–13.9)

## 2021-02-25 LAB — CBC WITH DIFF
BASOPHIL #: <0.1 10*3/uL (ref <=0.20)
BASOPHIL %: 1 %
EOSINOPHIL #: <0.1 10*3/uL (ref <=0.50)
EOSINOPHIL %: 0 %
HCT: 20.7 % — ABNORMAL LOW (ref 34.8–46.0)
HGB: 6.8 g/dL — CL (ref 11.5–16.0)
IMMATURE GRANULOCYTE #: 0.17 10*3/uL — ABNORMAL HIGH (ref <0.10)
IMMATURE GRANULOCYTE %: 2 % — ABNORMAL HIGH (ref 0–1)
LYMPHOCYTE #: 1.45 10*3/uL (ref 1.00–4.80)
LYMPHOCYTE %: 18 %
MCH: 27 pg (ref 26.0–32.0)
MCHC: 32.9 g/dL (ref 31.0–35.5)
MCV: 82.1 fL (ref 78.0–100.0)
MONOCYTE #: 0.54 10*3/uL (ref 0.20–1.10)
MONOCYTE %: 7 %
MPV: 8.1 fL — ABNORMAL LOW (ref 8.7–12.5)
NEUTROPHIL #: 5.69 10*3/uL (ref 1.50–7.70)
NEUTROPHIL %: 72 %
PLATELETS: 306 10*3/uL (ref 150–400)
RBC: 2.52 10*6/uL — ABNORMAL LOW (ref 3.85–5.22)
RDW-CV: 18.3 % — ABNORMAL HIGH (ref 11.5–15.5)
WBC: 7.9 10*3/uL (ref 3.7–11.0)

## 2021-02-25 LAB — MAGNESIUM: MAGNESIUM: 2.3 mg/dL (ref 1.8–2.6)

## 2021-02-25 LAB — TYPE AND SCREEN

## 2021-02-25 LAB — PATH COMMENT: PATHOLOGIST INTERPRETATION: ABNORMAL — AB

## 2021-02-25 LAB — H & H
HCT: 22.2 % — ABNORMAL LOW (ref 34.8–46.0)
HGB: 7.3 g/dL — ABNORMAL LOW (ref 11.5–16.0)

## 2021-02-25 LAB — PHOSPHORUS: PHOSPHORUS: 7.4 mg/dL — ABNORMAL HIGH (ref 2.4–4.7)

## 2021-02-25 MED ORDER — SODIUM CHLORIDE 0.9 % INJECTION SOLUTION
2.0000 mL | INTRAVENOUS | Status: DC
Start: 2021-02-25 — End: 2021-03-02

## 2021-02-25 NOTE — Care Plan (Signed)
[facility]  Physical Therapy Progress Note      Patient Name: [name] [name]  Date of Birth: 07-Nov-1985  Height:  165.1 cm ('5\' 5"'$ )  Weight:  67.3 kg (148 lb 5.9 oz)  Room/Bed: 29/A  Payor: [place] MEDICAID / Plan: [employer] MEDICAID / Product Type: Medicaid [facility] /     Assessment:     Ms. Insinga tolerated tx well today. STS SBA and pt amb 152f w/ SBA, limited by chest tube insertion pain. Continue to rec home with assist when medically stable. Will continue to follow.    Discharge Needs:   Equipment Recommendation: none anticipated    Discharge Disposition: home with assist    JUSTIFICATION OF DISCHARGE RECOMMENDATION   Based on current diagnosis, functional performance prior to admission, and current functional performance, this patient requires continued PT services in home with assist in order to achieve significant functional improvements in these deficit areas: aerobic capacity/endurance, gait, locomotion, and balance, muscle performance.      Plan:   Continue to follow patient according to established plan of care.  The risks/benefits of therapy have been discussed with the patient/caregiver and he/she is in agreement with the established plan of care.     Subjective & Objective:        02/25/21 [id number]  Therapist Pager   PT Assigned/ Pager # SRemo [phone]  Rehab Session   Document Type therapy progress note (daily note)   Total PT Minutes: 9   Patient Effort adequate   Symptoms Noted During/After Treatment increased pain   Symptoms Noted Comment Pt c/o increased back pain after amb.   General Information   Patient Profile Reviewed yes   Medical Lines Telemetry;Chest Tube;PICC Line   Respiratory Status room air   Existing Precautions/Restrictions fall precautions;full code   Mutuality/Individual Preferences   Individualized Care Needs OOB Ax1   Pre Treatment Status   Pre Treatment Patient Status Patient sitting in bedside chair or w/c;Call light within reach;Telephone within  reach;Sitter select activated;Nurse approved session   Support Present Pre Treatment  None   Communication Pre Treatment  Nurse   Vital Signs   Vitals Comment VSS on RA   Pain Assessment   Pre/Posttreatment Pain Comment Pt c/o increased chest tube insertion site pain after amb, not rated.   Bed Mobility Assessment/Treatment   Comment Pt began and ended tx session in chair   Transfer Assessment/Treatment   Sit-Stand [place] stand-by assistance   Stand-Sit Independence stand-by assistance   Gait Assessment/Treatment   Independence  stand-by assistance   Distance in Feet 1563f  Comment Pt limited by chest tube insertion site pain.   Balance Skill Training   Comment No AD   Sitting Balance: Static good balance   Sitting, Dynamic (Balance) fair + balance   Sit-to-Stand Balance fair + balance   Standing Balance: Static fair + balance   Standing Balance: Dynamic fair balance   Post Treatment Status   Post Treatment Patient Status Patient sitting in bedside chair or w/c;Telephone within reach;Call light within reach;Sitter select activated   SuArt gallery managerobility Am-PAC/6Clicks Score (APPROVED PT Staff, [facility] PT/OT, [facility] Nursing ONLY, BMElyJMCalion  Turning in bed without bedrails 4   Lying on back to sitting on edge of flat bed 4   Moving to and from a bed to a chair 4   Standing up from chair  4   Walk in room 3   Climbing 3-5 steps with railing 3   6 Clicks Raw Score total 22   Standardized (t-scale) score 47.4   CMS 0-100% Score 25.02   CMS Modifier CJ   Patient Mobility Goal (JHHLM) 7- Walk 25 feet or more 3X/day   Exercise/Activity Level Performed 7- Walked 25 feet or more   Physical Therapy Clinical Impression   Assessment Ms. Ernsberger tolerated tx well today. STS SBA and pt amb 150f w/ SBA, limited by chest tube insertion pain. Continue to rec home with assist when medically stable. Will continue to follow.   Anticipated Equipment Needs at  Discharge (PT) none anticipated   Anticipated Discharge Disposition home with assist       Therapist:   SOdessa Fleming PTA   Pager #: [phone]

## 2021-02-25 NOTE — Progress Notes (Signed)
[facility]  Cardiac Surgery   Floor/Stepdown Progress Note      [name] [name], [name]  Date of Admission:  02/05/2021  Date of Birth:  April 18, 1986    Hospital Day:  LOS: 20 days   Post-op Day:  12 Days Post-Op, S/P On pump beating heart TVR (33 mm Epic) with closure of PFO  Date of Service:  02/25/2021    SUBJECTIVE: Patient endorsing pain at the pigtail insertion site. She explained that she hasn't ambulated too much, but would like to once the chest tube is removed. She denies chest pains, SOB, NVD, fevers, chills or diaphoresis.     OBJECTIVE:    Vital Signs:  Temp (24hrs) Max:36.8 C (98.3 F)      Temperature: 36.8 C (98.2 F) (02/25/21 1050)  BP (Non-Invasive): (!) 130/95 (02/25/21 0344)  MAP (Non-Invasive): 106 mmHG (02/25/21 0344)  Heart Rate: 92 (02/25/21 0345)  Respiratory Rate: 20 (02/25/21 0631)  SpO2: 97 % (02/25/21 0345)    Base (Admission) Weight:  Weight: 72.4 kg (159 lb 9.8 oz)  Weight:  Weight: 67.3 kg (148 lb 5.9 oz)    Current Inpatient Medications:  acetaminophen (TYLENOL) tablet, 650 mg, Oral, Q4H PRN  aspirin chewable tablet 81 mg, 81 mg, Oral, Daily  B complex-vitamin C-folic acid (NEPHROCAPS) capsule, 1 Capsule, Oral, Daily  bisacodyl (DULCOLAX) rectal suppository, 10 mg, Rectal, Daily PRN  bumetanide (BUMEX) 0.25 mg/mL injection, 1 mg, Intravenous, Q8H  calcium carbonate (TUMS) '500mg'$  ('200mg'$  elemental calcium) chewable tablet, 500 mg, Oral, 3x/day PRN  DAPTOmycin (CUBICIN) 550 mg in NS 50 mL IVPB, 8 mg/kg, Intravenous, Q24H  famotidine (PEPCID) tablet, 20 mg, Oral, Daily  ferric gluconate (FERRLECIT) 250 mg in NS 100 mL IVPB, 250 mg, Intravenous, Q48H  hydrALAZINE (APRESOLINE) injection 10 mg, 10 mg, Intravenous, Q4H PRN  HYDROmorphone (DILAUDID) 1 mg/mL injection, 0.4 mg, Intravenous, Q4H PRN  ipratropium (ATROVENT) 0.02% nebulizer solution, 0.5 mg, Nebulization, Q4H PRN  levalbuterol (XOPENEX) 1.25 mg/ 0.5 mL nebulizer solution, 1.25 mg, Nebulization, Q4H PRN  lidocaine-menthol  (LIDOPATCH) 3.6%-1.25% patch, 1 Patch, Transdermal, Daily  lidocaine-menthol (LIDOPATCH) 3.6%-1.25% patch, 1 Patch, Transdermal, Daily  lidocaine-menthol (LIDOPATCH) 3.6%-1.25% patch, 1 Patch, Transdermal, Daily  magnesium hydroxide (MILK OF MAGNESIA) '400mg'$  per 22m oral liquid, 30 mL, Oral, Daily PRN  melatonin tablet, 6 mg, Oral, NIGHTLY  metoprolol tartrate (LOPRESSOR) tablet, 25 mg, Oral, 2x/day  miconazole nitrate (SECURA) 2% topical cream, , Apply Topically, 2x/day  nicotine polacrilex (COMMIT) lozenge, 2 mg, Oral, Q2H PRN  NS flush syringe, 10-30 mL, Intracatheter, Q8HRS  NS flush syringe, 20-30 mL, Intracatheter, Q1 MIN PRN  ondansetron (ZOFRAN) 2 mg/mL injection, 4 mg, Intravenous, Q8H PRN  oxyCODONE concentrate (ROXICODONE INTENSOL) 10 mg per 0.5 mL oral liquid, 10 mg, Oral, Q6H PRN   Or  oxyCODONE concentrate (ROXICODONE INTENSOL) 10 mg per 0.5 mL oral liquid, 5 mg, Oral, Q6H PRN  perflutren lipid microspheres (DEFINITY) 1.3 mL in NS 10 mL (tot vol) injection, 2 mL, Intravenous, Give in Cardiology  phenol (CHLORASEPTIC) 1.4% oromucosal spray, 1 Spray, Mouth/Throat, Q4H PRN  polyethylene glycol (MIRALAX) oral packet, 34 g, Oral, Daily  QUEtiapine (SEROQUEL) tablet, 100 mg, Oral, NIGHTLY  rifAMPin (RIFADIN) capsule, 300 mg, Oral, 2x/day  sennosides-docusate sodium (SENOKOT-S) 8.6-'50mg'$  per tablet, 2 Tablet, Oral, 3x/day  sevelamer carbonate (RENVELA) oral powder, 2,400 mg, Oral, 3x/day-Meals  sodium chloride 3% nebulizer solution, 3 mL, Nebulization, Q6H PRN  sodium citrate 4% (3 mL) injection syringe, 2 Syringe, Intracatheter, Q1H PRN  triamcinolone acetonide (ARISTOCORT A)  0.1% topical ointment, , Apply Topically, 2x/day  Warfarin Placeholder, , Does not apply, Daily PRN      Appropriate Home Meds restarted:  Yes    I/O:  I/O last 24 hours to current time:      Intake/Output Summary (Last 24 hours) at 02/25/2021 1250  Last data filed at 02/25/2021 1011  Gross per 24 hour   Intake 1296.8 ml   Output 910 ml    Net 386.8 ml     I/O last 3 completed shifts:  In: 1592.8 [P.O.:871; I.V.:344.8; Blood:377]  Out: 920 [Urine:910; Chest Tube:10]    Nutrition/Diet:  MNT PROTOCOL FOR DIETITIAN  DIET RENAL (60G PRO,2G NA,2G K) Restrict fluids to: 1500 ML  DIETARY ORAL SUPPLEMENTS Oral Supplements with tray: Magic Cup-Orange; LUNCH/DINNER; 1 Each      Labs:  Reviewed:  I have reviewed all lab results.  Ordered:  Daily CBC, BMP, Magnesium, and PT/INR  Anticoagulation:  Yes  Target INR:  2.0-3.0   Follow Up Arranged:  No    Radiology:    Reviewed:  I have personally reviewed all current imaging   Ordered:  Daily CXR    Microbiology:  Reviewed   - Sterile site culture (9/9): NGTD  - Postoperative BCX (9/2): Negative for over 5 days    Physical Exam:    Constitutional:  appears chronically ill, acutely ill, appears older than stated age, pale, cachectic, no distress and vital signs reviewed  Eyes:  Pupils equal and round.   ENT:  Mouth mucous membranes dry.   Neck:  supple, symmetrical, trachea midline  Respiratory:  decreased breath sounds bibasilar  Cardiovascular:  regular rate and rhythm  S1, S2 normal  pedal edema 1+ generalized  Gastrointestinal:  Soft, non-tender, Bowel sounds normal  Genitourinary:  Deferred  Musculoskeletal:  Head atraumatic and normocephalic and AROM  Integumentary:  Skin warm and dry and Anterior midline surgical incision OTA - edges well approximated without signs of infection  Neurologic:  Alert and oriented x3  Psychiatric:  Anxious    ASSESSMENT/PLAN:  12 Days Post-Op On pump beating heart TVR (33 mm Epic) with closure of PFO    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 Carver Fila due to Hemoglobin of 7.3   - Diuresis: Bumex 1 mg Q 8 hours as significant improvement to peripheral edema  - Post-op EF: Normal Bi-V function    Respiratory: Atelectasis / Right Hemathorax S/P Pigtail Insertion on 9/9 - stable, improved  - Use IS and PEP  - Wean oxygen to maintain 02 Sat > 90%  - Pigtailed removed around noon without complication   - Up and ambulate  - Nebs PRN  - Daily CXR    Renal/GU/Endocrine: Postoperative Hyperglycemia - resolved / AKI requiring Pre/Post HD  - Last Cr : 1.98 (1.84)  - Avoid Nephrotoxic Agents   - See diuresis plan outlined in Cardiac Section above  - Nephrology following for HD needs with last HD on 8/31  - A1C: 5.8  - SSI Protocol -  Conservative Scale AC/HS/PRN  - Continue to follow 24 hour insulin trends to determine need for dose adjustment    Gastrointestinal: GI Ulcer PPX  - Diet: MNT PROTOCOL FOR DIETITIAN  DIET RENAL (60G PRO,2G NA,2G K) Restrict fluids to: 1500 ML  DIETARY ORAL SUPPLEMENTS Oral Supplements with tray: Magic Cup-Orange; LUNCH/DINNER; 1 Each  - Bowel Regimen: Colace and Miralax   - PRN: Milk of Mag and Dulcolax Suppository  - Post-op BM : Negative   - PPI: Protonix 40 mg PO daily    - To be continued for one month upon discharge     ID: MRSA Bacteremia / Endocarditis   - Antibiotics: Daptomycin and Rifampin   - Continue to monitor for worsening signs of infection, fevers, or leukocytosis  - Optimal Trial discussed with patient with decent candidacy with Infusion Service attending to discuss with service this up coming Monday (CT CAP and TTE ordered by infusion services)     Heme: Preoperative Thrombocytopenia / Idiopathic Postoperative Anemia - Stable, improving    - Monitor CBC for acute surgical blood loss anemia and thrombocytopenia  - Blood Products: Transfused 1 unit of PRBC on 9/10 for AM Hgb of 6.6  - Hemoglobin this morning at 7.3 on repeat    Holding transfusion at this time due to  patient being asymptomatic per dr. Carver Fila    If patient is to become symptomatic   - Anticoagulant: See Cardiac Section for Coumadin Mgmt    Neuro/Psych: Postoperative Surgical Pain / IVDU / Smoker ? Anxiety   - PRN acetaminophen for mild pain / fevers  - PRN Pain Meds: PO Oxycodone 5 mg / 10 mg  - Continue Seroquel HS for sleep  - Continue scheduled Melatonin for sleep  - Routine neuro exams  - Day / Night hygiene   - PT / OT/ OOB     Consults: None    D/C Dispo: Pending Infusion Service vs Optimal Trial     Problem List:  Active Hospital Problems    Diagnosis   . Endocarditis       PT/OT: Yes    DVT RISK FACTORS HAVE BEN ASSESSED AND PROPHYLAXIS ORDERED (SEE RUBYONLINE - REFERENCE TOOLS - MD, DVT PROPHY OR POCKET CARD):  YES    Disposition Planning: Home with Optimal Trial vs Infusion service to complete ABX     E&M Attestation:  I independently of the faculty provider spent a total of (35) minutes in direct/indirect care of this patient including initial evaluation, review of laboratory, radiology, diagnostic studies, review of medical record, order entry and coordination of care.     Patient was seen on rounds with Dr. Carver Fila with inaptient plan above discussed and formulated by SCT and [facility].    Lupita Shutter, PA-C  02/25/2021, 13:37

## 2021-02-25 NOTE — Care Plan (Signed)
Patient s/p TVR on 8/30 and R pigtail placement on 9/9. Incisions CDI. CT dressing CDI. No output from CT overnight. D/C CT today? Pain controlled with PRN tylenol, oxycodone, and dilaudid. VSS. Sitter select in place.    Problem: Adult Inpatient Plan of Care  Goal: Patient-Specific Goal (Individualized)  Outcome: Ongoing (see interventions/notes)  Flowsheets (Taken 02/25/2021 0600)  Individualized Care Needs: OOB x1  Anxieties, Fears or Concerns: concerned about pain  Patient-Specific Goals (Include Timeframe): to get CT out

## 2021-02-25 NOTE — Nurses Notes (Signed)
Right chest tube removed by Olen Cordial PA-pt tolerated procedure well-occlusive dressing applied.

## 2021-02-25 NOTE — Procedures (Signed)
Chest Tube Removal Procedure       Procedure Date:  02/25/2021  Time:  1200  Procedure: right Chest Tube/Pig Tail Removal  Diagnosis:  S/p TVR       Description: The patient's CXR and chest tube output over the past 24 hrs were reviewed. Given decreased output, CT were no longer needed and I had a thorough discussion with the patient regarding the process of CT removal. The patient was positioned supine with HOB slightly elevated. The chest tubes were taken off of suction and evaluated for air leak for which none was noted. The skin around the CT site was cleansed with chlorhexidine. The right chest tubes were then removed and the sites were dressed in sterile fashion. Patient tolerated procedure without complications.     Lupita Shutter, PA-C  02/25/2021, 12:49

## 2021-02-25 NOTE — Ancillary Notes (Signed)
Visited patient for ambulation, did not complete as patient recently ambulated with PT staff and would attempt to ambulate after CT removal later today.    Will follow-up with patient for future cardiac rehab services.    Patient in chair with call bell within reach.    [place], ES  Cardiac Rehabilitation  [phone]

## 2021-02-26 ENCOUNTER — Inpatient Hospital Stay (HOSPITAL_COMMUNITY): Payer: Medicaid Other

## 2021-02-26 DIAGNOSIS — R918 Other nonspecific abnormal finding of lung field: Secondary | ICD-10-CM

## 2021-02-26 DIAGNOSIS — J984 Other disorders of lung: Secondary | ICD-10-CM

## 2021-02-26 DIAGNOSIS — F1311 Sedative, hypnotic or anxiolytic abuse, in remission: Secondary | ICD-10-CM

## 2021-02-26 DIAGNOSIS — Z952 Presence of prosthetic heart valve: Secondary | ICD-10-CM

## 2021-02-26 DIAGNOSIS — D62 Acute posthemorrhagic anemia: Secondary | ICD-10-CM

## 2021-02-26 DIAGNOSIS — J948 Other specified pleural conditions: Secondary | ICD-10-CM

## 2021-02-26 DIAGNOSIS — Z8679 Personal history of other diseases of the circulatory system: Secondary | ICD-10-CM

## 2021-02-26 DIAGNOSIS — J9811 Atelectasis: Secondary | ICD-10-CM

## 2021-02-26 DIAGNOSIS — F1011 Alcohol abuse, in remission: Secondary | ICD-10-CM

## 2021-02-26 DIAGNOSIS — J90 Pleural effusion, not elsewhere classified: Secondary | ICD-10-CM

## 2021-02-26 DIAGNOSIS — Z0189 Encounter for other specified special examinations: Secondary | ICD-10-CM

## 2021-02-26 DIAGNOSIS — F1590 Other stimulant use, unspecified, uncomplicated: Secondary | ICD-10-CM

## 2021-02-26 DIAGNOSIS — Z452 Encounter for adjustment and management of vascular access device: Secondary | ICD-10-CM

## 2021-02-26 DIAGNOSIS — I2690 Septic pulmonary embolism without acute cor pulmonale: Secondary | ICD-10-CM

## 2021-02-26 DIAGNOSIS — R0689 Other abnormalities of breathing: Secondary | ICD-10-CM

## 2021-02-26 DIAGNOSIS — R162 Hepatomegaly with splenomegaly, not elsewhere classified: Secondary | ICD-10-CM

## 2021-02-26 DIAGNOSIS — R188 Other ascites: Secondary | ICD-10-CM

## 2021-02-26 LAB — TRANSTHORACIC ECHOCARDIOGRAM - ADULT
Aortic Valve Area by Continuity of Peak Velocity: 2.63 cm2
Aortic Valve Area by Continuity of VTI: 2.7 cm2
Ascending aorta: 2.8 cm
FS: 32 percent (ref 28–44)
IVS: 0.9 cm (ref 0.6–1.1)
LV Diastolic Volume Index: 106.6 milliliter
LV Diastolic Volume Index: 79.9 milliliter
LV Diastolic Volume Index: 86.2 milliliter
LV Diastolic Volume Index: 92.7 milliliter
LV Systolic Volume Index: 27.5 milliliter
LV Systolic Volume Index: 33.7 milliliter
LV Systolic Volume Index: 35.9 milliliter
LV Systolic Volume Index: 45.2 milliliter
LVIDD: 4.37 cm (ref 3.5–6.0)
LVIDS: 2.95 cm (ref 2.1–4.0)
LVOT diameter: 2.01 cm
LVOT stroke volume: 68.6 milliliter
LVPWD: 0.92 cm
MV Deceleration Time: 204.04 ms
MV Peak A Vel: 46.16 cm/s
MV Peak E Vel: 88.14 cm/s
Pulmonic Valve Acceleration Time: 93.24 ms
TV VTI: 47.93 cm
TV mean gradient: 5.18 mmHg
TV peak gradient: 10.61 mmHg
Tricuspid Valve Max Velocity: 162.75 cm/s
Tricuspid Valve Mean Velocity: 105.42 cm/s

## 2021-02-26 LAB — BASIC METABOLIC PANEL
ANION GAP: 12 mmol/L (ref 4–13)
BUN/CREA RATIO: 32 — ABNORMAL HIGH (ref 6–22)
BUN: 61 mg/dL — ABNORMAL HIGH (ref 8–25)
CALCIUM: 8.4 mg/dL — ABNORMAL LOW (ref 8.5–10.0)
CHLORIDE: 106 mmol/L (ref 96–111)
CO2 TOTAL: 17 mmol/L — ABNORMAL LOW (ref 22–30)
CREATININE: 1.9 mg/dL — ABNORMAL HIGH (ref 0.60–1.05)
ESTIMATED GFR: 35 mL/min/BSA — ABNORMAL LOW (ref >=60)
GLUCOSE: 110 mg/dL (ref 65–125)
POTASSIUM: 3.8 mmol/L (ref 3.5–5.1)
SODIUM: 135 mmol/L — ABNORMAL LOW (ref 136–145)

## 2021-02-26 LAB — CBC WITH DIFF
BASOPHIL #: <0.1 10*3/uL (ref <=0.20)
BASOPHIL %: 1 %
EOSINOPHIL #: <0.1 10*3/uL (ref <=0.50)
EOSINOPHIL %: 0 %
HCT: 27.3 % — ABNORMAL LOW (ref 34.8–46.0)
HGB: 9 g/dL — ABNORMAL LOW (ref 11.5–16.0)
IMMATURE GRANULOCYTE #: 0.11 10*3/uL — ABNORMAL HIGH (ref <0.10)
IMMATURE GRANULOCYTE %: 2 % — ABNORMAL HIGH (ref 0–1)
LYMPHOCYTE #: 1.26 10*3/uL (ref 1.00–4.80)
LYMPHOCYTE %: 19 %
MCH: 26.8 pg (ref 26.0–32.0)
MCHC: 33 g/dL (ref 31.0–35.5)
MCV: 81.3 fL (ref 78.0–100.0)
MONOCYTE #: 0.41 10*3/uL (ref 0.20–1.10)
MONOCYTE %: 6 %
MPV: 8 fL — ABNORMAL LOW (ref 8.7–12.5)
NEUTROPHIL #: 4.63 10*3/uL (ref 1.50–7.70)
NEUTROPHIL %: 72 %
PLATELETS: 262 10*3/uL (ref 150–400)
RBC: 3.36 10*6/uL — ABNORMAL LOW (ref 3.85–5.22)
RDW-CV: 18.1 % — ABNORMAL HIGH (ref 11.5–15.5)
WBC: 6.5 10*3/uL (ref 3.7–11.0)

## 2021-02-26 LAB — PT/INR
INR: 1.61 — ABNORMAL HIGH (ref 0.80–1.20)
PROTHROMBIN TIME: 18.7 seconds — ABNORMAL HIGH (ref 9.1–13.9)

## 2021-02-26 LAB — MAGNESIUM: MAGNESIUM: 2.2 mg/dL (ref 1.8–2.6)

## 2021-02-26 LAB — HEPATITIS B CORE ANTIBODY: HBV CORE TOTAL ANTIBODIES: NEGATIVE

## 2021-02-26 LAB — HAPTOGLOBIN, SERUM: HAPTOGLOBLIN: 241 mg/dL — ABNORMAL HIGH (ref 32–197)

## 2021-02-26 LAB — C-REACTIVE PROTEIN(CRP),INFLAMMATION: CRP INFLAMMATION: 60.7 mg/L — ABNORMAL HIGH (ref <8.0)

## 2021-02-26 LAB — PHOSPHORUS: PHOSPHORUS: 7.3 mg/dL — ABNORMAL HIGH (ref 2.4–4.7)

## 2021-02-26 MED ORDER — BUPRENORPHINE 2 MG-NALOXONE 0.5 MG SUBLINGUAL FILM
2.0000 | ORAL_FILM | Freq: Two times a day (BID) | SUBLINGUAL | Status: DC
Start: 2021-02-27 — End: 2021-03-02
  Administered 2021-02-27 – 2021-03-02 (×8): 2 via SUBLINGUAL
  Filled 2021-02-26 (×8): qty 2

## 2021-02-26 MED ORDER — WARFARIN 1 MG TABLET
0.5000 mg | ORAL_TABLET | Freq: Once | ORAL | Status: DC
Start: 2021-02-26 — End: 2021-02-26

## 2021-02-26 MED ORDER — WARFARIN 1 MG TABLET
1.0000 mg | ORAL_TABLET | Freq: Once | ORAL | Status: AC
Start: 2021-02-26 — End: 2021-02-26
  Administered 2021-02-26: 1 mg via ORAL
  Filled 2021-02-26: qty 1

## 2021-02-26 MED ORDER — SODIUM BICARBONATE 650 MG TABLET
650.0000 mg | ORAL_TABLET | Freq: Three times a day (TID) | ORAL | Status: DC
Start: 2021-02-26 — End: 2021-02-27
  Administered 2021-02-26 (×2): 650 mg via ORAL
  Filled 2021-02-26 (×3): qty 1

## 2021-02-26 MED ORDER — POTASSIUM CHLORIDE ER 20 MEQ TABLET,EXTENDED RELEASE(PART/CRYST)
20.0000 meq | ORAL_TABLET | Freq: Once | ORAL | Status: AC
Start: 2021-02-26 — End: 2021-02-26
  Administered 2021-02-26: 20 meq via ORAL
  Filled 2021-02-26: qty 1

## 2021-02-26 MED ORDER — SENNOSIDES 8.6 MG-DOCUSATE SODIUM 50 MG TABLET
2.0000 | ORAL_TABLET | Freq: Two times a day (BID) | ORAL | Status: DC
Start: 2021-02-27 — End: 2021-03-02
  Administered 2021-02-27 – 2021-02-28 (×3): 0 via ORAL
  Administered 2021-02-28: 2 via ORAL
  Administered 2021-02-28: 0 via ORAL
  Administered 2021-03-01 (×2): 2 via ORAL
  Administered 2021-03-02: 0 via ORAL
  Filled 2021-02-26 (×6): qty 2

## 2021-02-26 NOTE — Consults (Signed)
[place] [facility] INITIAL CONSULT    Name: [name] [name] Bed: 664/A   Age: 35 y.o. 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  She initially presented to the emergency department at [facility] on 01/31/2021 with drug overdose, and she was admitted there 02/01/2021-02/04/2021 prior to being transferred to our facility.  She was intubated for airway protection.  She had progression of acute kidney injury to anuric acute renal failure requiring dialysis through a temporary dialysis catheter.  She developed anemia requiring blood transfusion and thrombocytopenia.  Blood cultures 02/01/2021-02/04/2021 grew MRSA (vancomycin MIC 2, rifampin MIC 1) with clearance of bacteremia not achieved at that time.  Chest radiograph on 01/31/2021 showed right middle  lobe patchy opacity concerning for aspiration, pulmonary vascular congestion, and cardiomegaly.  CT brain without contrast on 01/31/2021 showed no acute intracranial abnormality.  Renal ultrasound on 02/02/2021 was unremarkable.  Transthoracic echocardiogram on 02/03/2021 showed left ventricular ejection fraction 55-60%, large tricuspid vegetation 1.91 cm X 1.67 cm with moderate tricuspid regurgitation, and trivial mitral regurgitation.  Repeat CT brain without contrast on 02/04/2021 showed no acute intracranial abnormality.  CT chest without contrast on 02/04/2021 showed multiple bilateral pulmonary nodules (solid, groundglass, and cavitary) predominantly located peripherally consistent with septic pulmonary emboli, small bilateral pleural effusion, and marked splenomegaly.  The patient received ceftriaxone on 01/31/2021, vancomycin 02/02/2021-02/04/2021, piperacillin-tazobactam 02/01/2021-02/02/2021, and cefepime and metronidazole 02/02/2021-02/04/2021.  Following arrival at our facility, blood cultures 02/05/2021-02/15/2021 grew MRSA (vancomycin MIC 1, daptomycin MIC 0.5, ceftaroline susceptible, rifampin MIC <=0.5) with clearance of bacteremia ultimately achieved postoperatively on 02/16/2021.  BAL cultures on 02/05/2021 and 02/13/2021 also grew MRSA.  Transthoracic echocardiogram on 02/05/2021 showed left ventricular ejection fraction 62.6%, tricuspid vegetation 1.4 cm X 0.8 cm with mild tricuspid regurgitation, and normal appearing mitral valve.  MRI brain without contrast on 02/06/2021 showed no acute intracranial abnormality.  MRIs cervical, thoracic, and lumbosacral spine without contrast on 02/10/2021 showed no evidence of infection.  Repeat transthoracic echocardiogram on 02/11/2021 showed tricuspid vegetation with severe tricuspid regurgitation.  The patient underwent extraction of remaining teeth on 02/09/2021.  She underwent bioprosthetic tricuspid valve replacement with primary closure of patient foramen ovale on 02/13/2021  when operative culture grew MRSA.  Surgical pathology showed valve tissue with fibrosis and adherent vegetations containing numerous colonies of bacterial organisms (predominantly gram positive cocci with nonviable forms) consistent with endocarditis.  She had her last dialysis session on 02/14/2021 with removal  of her most recent left femoral temporary dialysis catheter at that time.  She developed a rash around 02/15/2021 that raised concern for vancomycin-induced rash.  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  Chest radiographs 02/17/2021-02/23/2021 showed bilateral pleural effusions with partially loculated right pleural effusion reported as of 02/22/2021.  The patient underwent right chest tube insertion on 02/23/2021 with immediate drainage of approximately 1 liter of bloody fluid consistent with hemothorax and subsequent removal on 02/25/2021.  Right pleural fluid culture on 02/23/2021 had no growth with no organisms on Gram stain.  The patient received vancomycin 02/06/2021-02/14/2021, daptomycin 02/17/2021-present, and rifampin 02/16/2021-present.  Plan is for a total 6-week antimicrobial course dated from negative blood cultures (end date 03/29/2021).   As clearance of bacteremia was not achieved preoperatively and operative cultures were positive, will follow the initial 6-weeks of treatment with doxycycline oral suppression for 6 months.  Our Psychiatry/Substance Use Treatment team has talked with the patient with plan for initiation of Suboxone on 02/27/2021.  The patient has expressed interest in the [place] and will undergo randomization tomorrow (for initiation of last 4 weeks beginning 03/02/2021).   Pre-enrollment testing for the OPTIMAL Trial performed on 02/26/2021 includes blood cultures X 2 sets that are pending; CT chest, abdomen, and pelvis without contrast that showed postoperative changes from tricuspid valve repair with loculated fluid  along right heart border, redemonstration of numerous septic emboli in bilateral lungs with improving groundglass and intralobular septal thickening, mild interval worsening of small right pleural effusion and right basilar consolidation with trace hydropneumothorax on right, and marked hepatosplenomegaly; and transthoracic echocardiogram that showed left ventricular ejection fraction 61.2% and tricuspid bioprosthesis with normal leaflet motion and no paravalvular tricuspid regurgitation.  The patient is currently doing well clinically renal function overall improving and CRP decreasing.     HPI ELEMENTS:   Symptom: Pain   Location: Sternal incision   Duration: Since 02/13/2021   Timing: Constant   Severity: Mild/controlled   Context: Status-post bioprosthetic tricuspid valve replacement with primary patent foramen ovale closure   Modifying factors: Pain medication has helped but has done well while holding/without it today   Associated signs and symptoms: No fevers, chills, shortness of breath, or difficulty with incisional healing    PAST MEDICAL HISTORY:  Injection drug use  Opioid use disorder  Stimulant use disorder  Acohol use disorder in remission  Sedative-hypnotic use disorder in remission  Cleared hepatitis C virus infection  Tobacco use disorder  Nephrolithiasis    PAST SURGICAL HISTORY:  Bioprosthetic tricuspid valve replacement with primary patent foramen ovale closure - 02/13/2021  Right chest tube insertion/removal - 02/23/2021/02/25/2021    CURRENT MEDICATIONS:  acetaminophen (TYLENOL) tablet, 650 mg, Oral, Q4H PRN  aspirin chewable tablet 81 mg, 81 mg, Oral, Daily  B complex-vitamin C-folic acid (NEPHROCAPS) capsule, 1 Capsule, Oral, Daily  bisacodyl (DULCOLAX) rectal suppository, 10 mg, Rectal, Daily PRN  bumetanide (BUMEX) 0.25 mg/mL injection, 1 mg, Intravenous, Q8H  [START ON 02/27/2021] buprenorphine-naloxone (SUBOXONE) 2-0.37m per sublingual film, 2 Film, Sublingual, 2x/day  calcium carbonate (TUMS)  5090m(20056mlemental calcium) chewable tablet, 500 mg, Oral, 3x/day PRN  DAPTOmycin (CUBICIN) 550 mg in NS 50 mL IVPB, 8 mg/kg, Intravenous, Q24H  famotidine (PEPCID) tablet, 20 mg, Oral, Daily  ferric gluconate (FERRLECIT) 250 mg in NS 100 mL IVPB, 250 mg, Intravenous, Q48H  hydrALAZINE (APRESOLINE) injection 10 mg, 10 mg, Intravenous, Q4H PRN  ipratropium (ATROVENT) 0.02% nebulizer solution, 0.5 mg, Nebulization,  Q4H PRN  levalbuterol (XOPENEX) 1.25 mg/ 0.5 mL nebulizer solution, 1.25 mg, Nebulization, Q4H PRN  lidocaine-menthol (LIDOPATCH) 3.6%-1.25% patch, 1 Patch, Transdermal, Daily  lidocaine-menthol (LIDOPATCH) 3.6%-1.25% patch, 1 Patch, Transdermal, Daily  lidocaine-menthol (LIDOPATCH) 3.6%-1.25% patch, 1 Patch, Transdermal, Daily  magnesium hydroxide (MILK OF MAGNESIA) 47m per 568moral liquid, 30 mL, Oral, Daily PRN  melatonin tablet, 6 mg, Oral, NIGHTLY  metoprolol tartrate (LOPRESSOR) tablet, 25 mg, Oral, 2x/day  miconazole nitrate (SECURA) 2% topical cream, , Apply Topically, 2x/day  nicotine polacrilex (COMMIT) lozenge, 2 mg, Oral, Q2H PRN  NS flush syringe, 10-30 mL, Intracatheter, Q8HRS  NS flush syringe, 20-30 mL, Intracatheter, Q1 MIN PRN  ondansetron (ZOFRAN) 2 mg/mL injection, 4 mg, Intravenous, Q8H PRN  perflutren lipid microspheres (DEFINITY) 1.3 mL in NS 10 mL (tot vol) injection, 2 mL, Intravenous, Give in Cardiology  phenol (CHLORASEPTIC) 1.4% oromucosal spray, 1 Spray, Mouth/Throat, Q4H PRN  polyethylene glycol (MIRALAX) oral packet, 34 g, Oral, Daily  QUEtiapine (SEROQUEL) tablet, 100 mg, Oral, NIGHTLY  rifAMPin (RIFADIN) capsule, 300 mg, Oral, 2x/day  sennosides-docusate sodium (SENOKOT-S) 8.6-50mg per tablet, 2 Tablet, Oral, 3x/day  sevelamer carbonate (RENVELA) oral powder, 2,400 mg, Oral, 3x/day-Meals  sodium bicarbonate tablet, 650 mg, Oral, 3x/day  sodium chloride 3% nebulizer solution, 3 mL, Nebulization, Q6H PRN  sodium citrate 4% (3 mL) injection syringe, 2 Syringe,  Intracatheter, Q1H PRN  triamcinolone acetonide (ARISTOCORT A) 0.1% topical ointment, , Apply Topically, 2x/day  Warfarin Placeholder, , Does not apply, Daily PRN    ALLERGIES:  No known allergies    FAMILY HISTORY:  Addiction - multiple family members  Psychiatric diagnoses - multiple family members (mother - anxiety; brother - schizophrenia, bipolar, dissociative identity disorder; aunts - anxiety and depression)  No family history of premature coronary artery disease    SOCIAL HISTORY:  [place].  She plans to move to [place] approximately 3 months after discharge to be with her mother and children (had previously stayed/lived there for 1 month).  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  She has been injecting drugs since 2009.  She has most recently be using heroin and methamphetamine.  She has a history of alcohol use disorder that is in remission.    Review of Systems:  Constitutional: Negative for fevers, chills, and sweats.  Eyes: Negative for acute visual disturbance and visual loss.  Ears, nose, mouth, throat, and face: Negative for sinus congestion and throat soreness.  Respiratory: Positive for cough.  Negative for current shortness of breath.  Cardiovascular: Positive for incisional chest pain and bilateral lower extremity swelling.  Negative for other chest pain and palpitations.  Gastrointestinal: Negative for abdominal pain, nausea, emesis, and diarrhea.  Genitourinary: Negative for dysuria and hematuria.  Integument: Positive for recent rash that is resolving.  Negative for new rash or difficulty with surgical site healing.  Musculoskeletal: Negative for myalgias and arthralgias.  Neurological: Negative for headaches and current confusion.  Behavioral/Psychiatric: Positive for anxiety, depression, and substance use.  Endocrine: Negative for diabetes and thyroid  disease.    Physical Exam:  Vitals:  Temperature: 36.7 C (98.1 F) Heart Rate: 92 BP (Non-Invasive): 128/79   Respiratory Rate: 16 SpO2: 94 %     General: Well-appearing without acute distress.  She does not look toxic.  Eyes: Pupils equal and round bilaterally.  No conjunctival injection or scleral icterus.  ENT: Face symmetrical.  Mucous membranes pink and moist.  Edentulous.  Neck: Supple and symmetrical.  Lungs: Decreased breath sounds in right lung base and otherwise clear to auscultation bilaterally.  No wheezes or crackles.  Unlabored respirations.  Cardiovascular: Regular rate and rhythm.  Abdomen: Normoactive bowel sounds.  Nondistended, soft, and nontender to palpation.  Extremities: No joint erythema or swelling.  Trace/less than 1+ edema of bilateral lower extremities.  Skin: Warm and dry.  Vasculitic rash faint and resolving on bilateral legs.  Neurologic: Alert and oriented X 3.  Moves all extremities without focal deficit.  Psychiatric: Pleasant.  Appropriate affect and behavior.  Intact memory.    Labs:    I have reviewed the lab results  Results for orders placed or performed during the hospital encounter of 02/05/21 (from the past 36 hour(s))   C-REACTIVE PROTEIN(CRP),INFLAMMATION    Collection Time: 02/26/21  5:09 AM   Result Value Ref Range    CRP INFLAMMATION 60.7 (H) <8.0 mg/L   PT/INR daily x 7    Collection Time: 02/26/21  5:09 AM   Result Value Ref Range    PROTHROMBIN TIME 18.7 (H) 9.1 - 13.9 seconds    INR 1.61 (H) 0.80 - 8.67   BASIC METABOLIC PANEL    Collection Time: 02/26/21  5:09 AM   Result Value Ref Range    SODIUM 135 (L) 136 - 145 mmol/L    POTASSIUM 3.8 3.5 - 5.1 mmol/L    CHLORIDE 106 96 - 111 mmol/L    CO2 TOTAL 17 (L) 22 - 30 mmol/L    ANION GAP 12 4 - 13 mmol/L    CALCIUM 8.4 (L) 8.5 - 10.0 mg/dL    GLUCOSE 110 65 - 125 mg/dL    BUN 61 (H) 8 - 25 mg/dL    CREATININE 1.90 (H) 0.60 - 1.05 mg/dL    BUN/CREA RATIO 32 (H) 6 - 22    ESTIMATED GFR 35 (L) >=60 mL/min/BSA    PHOSPHORUS    Collection Time: 02/26/21  5:09 AM   Result Value Ref Range    PHOSPHORUS 7.3 (H) 2.4 - 4.7 mg/dL   MAGNESIUM    Collection Time: 02/26/21  5:09 AM   Result Value Ref Range    MAGNESIUM 2.2 1.8 - 2.6 mg/dL   CBC WITH DIFF    Collection Time: 02/26/21  5:09 AM   Result Value Ref Range    WBC 6.5 3.7 - 11.0 x10^3/uL    RBC 3.36 (L) 3.85 - 5.22 x10^6/uL    HGB 9.0 (L) 11.5 - 16.0 g/dL    HCT 27.3 (L) 34.8 - 46.0 %    MCV 81.3 78.0 - 100.0 fL    MCH 26.8 26.0 - 32.0 pg    MCHC 33.0 31.0 - 35.5 g/dL    RDW-CV 18.1 (H) 11.5 - 15.5 %    PLATELETS 262 150 - 400 x10^3/uL    MPV 8.0 (L) 8.7 - 12.5 fL    NEUTROPHIL % 72 %    LYMPHOCYTE % 19 %    MONOCYTE % 6 %    EOSINOPHIL % 0 %    BASOPHIL % 1 %    NEUTROPHIL # 4.63 1.50 - 7.70 x10^3/uL    LYMPHOCYTE # 1.26 1.00 - 4.80 x10^3/uL    MONOCYTE # 0.41 0.20 - 1.10 x10^3/uL    EOSINOPHIL # <0.10 <=0.50 x10^3/uL    BASOPHIL # <0.10 <=0.20 x10^3/uL    IMMATURE GRANULOCYTE % 2 (H) 0 -  1 %    IMMATURE GRANULOCYTE # 0.11 (H) <0.10 x10^3/uL   TRANSTHORACIC ECHOCARDIOGRAM - ADULT    Collection Time: 02/26/21  3:13 PM   Result Value Ref Range    FS 32 28 - 44 percent    LV Diastolic Volume Index 66.2 milliliter    LV Systolic Volume Index 94.7 milliliter    IVS 0.90 0.6 - 1.1 cm    LVIDD 4.37 3.5 - 6.0 cm    LVIDS 2.95 2.1 - 4.0 cm    LVOT diameter 2.01 cm    LVPWD 6.54 cm    LV Diastolic Volume Index 65.0 milliliter    LV Diastolic Volume Index 354.6 milliliter    LV Diastolic Volume Index 56.8 milliliter    LV Systolic Volume Index 12.7 milliliter    LV Systolic Volume Index 51.7 milliliter    LV Systolic Volume Index 00.1 milliliter    LVOT stroke volume 68.60 milliliter    Aortic Valve Area by Continuity of Peak Velocity 2.63 cm2    Aortic Valve Area by Continuity of VTI 2.70 cm2    MV Peak A Vel 46.16 cm/s    MV Deceleration Time 204.04 ms    MV Peak E Vel 88.14 cm/s    Pulmonic Valve Acceleration Time 93.24 ms    TV peak gradient 10.61 mmHg    TV mean gradient 5.18  mmHg    Tricuspid Valve Max Velocity 162.75 cm/s    Tricuspid Valve Mean Velocity 105.42 cm/s    TV VTI 47.93 cm    Ascending aorta 2.80 cm     Imaging Studies:    I have reviewed the images.   Results for orders placed or performed during the hospital encounter of 02/05/21 (from the past 72 hour(s))   XR AP MOBILE CHEST     Status: None    Narrative    XR AP MOBILE CHEST performed on 02/24/2021 6:46 AM    INDICATION: 35 years old Female  assess for effusion    TECHNIQUE: 1 view(s) of the chest; 1 image(s)    COMPARISON: Chest radiograph 02/23/2021.    FINDINGS: The heart is enlarged. Right pleural fluid and atelectasis is unchanged. Left basilar opacities are noted. A right PICC is in stable position.        Impression    Unchanged right pleural fluid and atelectasis.   XR AP MOBILE CHEST     Status: None    Narrative    XR AP MOBILE CHEST performed on 02/25/2021 6:09 AM    INDICATION: 35 years old Female  S/P TVR with Pigtail placed 9/9 for right hemathorax - follow up status for removal    TECHNIQUE: 1 view(s) of the chest; 1 image(s)    COMPARISON: Chest radiograph 02/24/2021.    FINDINGS: Right pigtail chest tube is in stable position. There is persistent right pleural fluid and atelectasis. Hazy left hemithorax opacity is similar in appearance given differences in technique. No pneumothorax.        Impression    Persistent right pleural fluid and atelectasis with unchanged left hazy opacity.   XR AP MOBILE CHEST     Status: None    Narrative    [name] [name]  Female, 35 years old.    XR AP MOBILE CHEST performed on 02/26/2021 6:09 AM.    REASON FOR EXAM:  s/p tvr    TECHNIQUE: 1 view/1 image(s) submitted for interpretation.    FINDINGS: Compared to 02/25/2021. There is a shallow  inspiration. The heart is stable in size. Bilateral patchy opacities and nodules are noted. No visible pneumothorax. Metallic foreign body projects within the soft tissues of the base of the right neck, likely broken needle fragment.    Right  pigtail chest tube has been removed. Right PICC is stable.        Impression    Stable lung aeration.         CT CHEST ABDOMEN PELVIS WO IV CONTRAST     Status: None    Narrative    [name] [name]  Female, 35 years old.    CT CHEST ABDOMEN PELVIS WO IV CONTRAST performed on 02/26/2021 9:22 AM.    REASON FOR EXAM:  Known history of native tricuspid valve endocarditis with septic pulmonary emboli status-post tricuspid valve replacement - exam ordered to assess for areas of active infection prior to enrollment in a clinical trial (OPTIMAL Trial)    RADIATION DOSE: 916.50 mGycm    COMPARISON: CT chest abdomen pelvis 02/05/2021    FINDINGS:   CHEST:  LUNGS/PLEURA: Redemonstration of numerous solid and cavitary pulmonary masses bilaterally consistent with septic emboli. The largest in the right lower lobe appears improved in size versus fluid within the cavitary component and lower lung volumes seen in the right series 3, image 205. The groundglass opacities and interlobular septal thickening have improved from prior exam. More consolidative opacities and right pleural effusion however have mildly increased in size. There appears to be a trace hydropneumothorax on the right series 2, image 61.    HEART/MEDIASTINUM: Heart size is mildly prominent with questionable loculated right pleural effusion versus pericardial effusion as seen on series 2, image 64.Marland Kitchen Postsurgical changes from tricuspid valve replacement is noted. Mediastinal structures are otherwise not well assessed due to inflammation and consolidation within the anterior mediastinum likely postsurgical changes. The aorta and pulmonary arteries appear within normal limits of size. Thyroid appears within normal limits. Right subclavian catheter with the tip in the low SVC right atrial junction is noted.    LYMPH NODES: No enlarged axillary lymph nodes with numerous subcentimeter lymph nodes noted.    BONES/SOFT TISSUES: Postsurgical changes from median sternotomy are  noted. No acute vertebral height loss within the thoracic spine. Diffuse anasarca is noted.    ABDOMEN/PELVIS:   LIVER: Liver is enlarged measuring 25.3 cm in length along the right hepatic lobe.    BILIARY SYSTEM/GALLBLADDER: No biliary dilatation. Gallbladder is mildly distended without wall thickening.    PANCREAS: Within normal limits.    KIDNEY/URETERS: No renal calculi or hydronephrosis.    ADRENALS: No definite adrenal nodules.    SPLEEN: Spleen is enlarged measuring 16.8 cm in length.    BOWEL: No abnormally dilated small bowel loops are seen. Scattered stool throughout the colon without evidence of obstruction.    VASCULATURE/LYMPH NODES: Visualized portion of the aorta is within normal limits. Difficult assessment for adenopathy due to lack of IV contrast and nonopacification of numerous bowel loops.    BLADDER: No asymmetric bladder wall thickening.    REPRODUCTIVE ORGANS: Uterus is not well-visualized.    PERITONEAL CAVITY: Small amount of ascites in the pelvis.    BONES/SOFT TISSUES: No acute vertebral height loss within the lumbar spine. Mild anasarca is noted.      Impression    1.Postsurgical changes from tricuspid valve repair with loculated fluid along the right heart border which may represent a loculated pericardial effusion versus loculated pleural effusion.  2.Redemonstration of numerous septic emboli in the bilateral  lungs with improving groundglass and intralobular septal thickening. There is however mild interval worsening of small right pleural effusion and right basilar consolidation with trace hydropneumothorax on the right.  3.Marked hepatosplenomegaly.  4.Persistent small amount of ascites in the pelvis.       EKG:  I have reviewed the tracings.   Recent Results (from the past 720 hour(s))   ECG 12-LEAD    Collection Time: 02/05/21  1:00 AM   Result Value    Ventricular rate 99    Atrial Rate 99    PR Interval 136    QRS Duration 86    QT Interval 338    QTC Calculation 433     Calculated P Axis 41    Calculated R Axis 13    Calculated T Axis 21    Narrative    Normal sinus rhythm  Normal ECG  No previous ECGs available  Preliminary by fellow Harlow Asa, [place] ([id number]) on 02/05/2021 1:22:59 PM  Confirmed by Pasty Spillers MD, ANTHONY ([id number]) on 02/05/2021 7:19:09 PM     Code Status: Full code    ASSESSMENT & PLAN: Ms. 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  Blood cultures 02/01/2021-02/15/2021 grew MRSA with clearance of bacteremia achieved on 02/16/2021.  Her course has been complicated by acute renal failure requiring dialysis through 02/14/2021 with creatine now improving, leukocytoclastic vasculitis, and right hemothorax requiring chest tube insertion on 02/23/2021 with subsequent removal on 02/25/2021.  She is currently receiving daptomycin (prior concern vancomycin was causing rash) and rifampin with plan for a total 6-week antimicrobial course dated from clearance of bacteremia (end date 03/29/2021) followed by doxycycline oral suppression X 6 months.  The patient will be enrolling in the OPTIMAL Trial and undergoing randomization to determine if the last 4 weeks will be with intravenous antimicrobial therapy (daptomycin and rifampin) or oral antimicrobial therapy (linezolid and rifampin).    Patient requires HIGH RISK DECISION MAKING due to the risk of complications, significant morbidity and/or mortality associated with the necessary therapy, specifically drug therapy, that requires intensive monitoring for toxicity resulting from the narrow therapeutic index of medications the patient is currently being administered, including but not limited to: antimicrobials such as Vancomycin, Daptomycin, and Gentamicin; new Warfarin/coumadin for anticoagulation for new valve  replacement; opioids for pain management in a patient with a history of substance abuse;  and/or medical alternative therapy such as Suboxone for history of substance abuse.     Additionally, this patient may require HIGH RISK DECISION MAKING for a chronic illness which poses a threat to life or bodily function, specifically psychiatric illness of addiction/substance misuse (if listed below) with a potential threat to self, as this condition directly lead to the patient's life-threatening infection and current need for hospitalization.     Does the patient have a history of an underlying substance use disorder? Yes    Disposition:  - Based upon interviewing the patient, reviewing the patient's chart, and physical examination, the patient is both suitable and agreeable to be transferred to the Medicine Hospitalist 7/Infusion Inpatient Service at this time to complete her course of care. This plan of care was relayed and directly discussed with the Cardiac Surgery team.    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 Renita Papa  - Microbiology:   [facility]:   - 02/01/2021 Blood cultures X 2 pediatric sets: MRSA (vancomycin MIC 2, rifampin MIC <1, tetracycline MIC <4) in 2 of 2 bottles   - 02/02/2021 Blood cultures X 2 sets: MRSA in 2 of 4 bottles (both sets)   - 02/02/2021 Sputum culture: MRSA as well as yeast and usual flora   - 02/04/2021 Blood cultures X 2 sets: MRSA in 2 of 4 bottles (both sets)   [facility]:   - 02/05/2021 BAL culture: 15,000 MRSA   - 02/05/2021 Blood  cultures X 2 sets: MRSA (vancomycin MIC 1, daptomycin MIC 0.5, ceftaroline susceptible, rifampin MIC <=0.5, linezolid MIC 2, tetracycline MIC <=1, doxycycline MIC <=0.5) in 2 of 2 sets   - 02/07/2021 Blood cultures X 2 sets: MRSA in 2 of 2 sets   - 02/09/2021 Blood cultures X 2 sets: MRSA in 2 of 2 sets   - 02/11/2021 Blood cultures X 2 sets: MRSA in 2 of 2 sets   - 02/13/2021 Blood cultures X 2 sets: MRSA in 2 of 2 sets   - 02/13/2021 Operative culture from tricuspid valve tissue: MRSA   - 02/13/2021 BAL culture: MRSA   - 02/14/2021 Blood cultures X 2 sets: MRSA in 2 of 2 sets   - 02/15/2021 Blood cultures X 2 sets: MRSA in 2 of 2 sets   - 02/16/2021 Blood cultures X 2 sets: No growth   - 02/23/2021 Right pleural fluid culture: No growth   - 02/25/2021 Blood cultures X 2 sets: Pending  - Antimicrobial Therapy: Daptomycin 8 mg/kg IV Q24 hours + rifampin 300 mg PO BID  - Duration of antimicrobial therapy and end date: 6 weeks dated from negative blood cultures (02/16/2021 = day 1); end date 03/29/2021  - Location of central access and date placed: PICC placed in right brachial vein on 02/21/2021  - Labs: Minimum of twice weekly CBC with differential, BMP, magnesium, hepatic function panel, and CRP; CK weekly; potassium goal 4 and magnesium goal 2 - replace as needed  - Additional medications: Aspirin 81 mg PO daily, bumetanide 1 mg IV Q8 hours, metoprolol tartrate 25 mg PO BID    Anticoagulation:  - Indication: Tricuspid valve replacement  - Medication/Therapy: Warfarin  - Current dose: 1 mg (resumed 02/26/2021)  - Anticoagulation needed at discharge: Yes - 3 months postoperatively unless otherwise specified by Dr. Renita Papa  - Goal INR: 2  - Last INR: 1.61    Pain Control:   - Current Regimen:   - Acetaminophen 650 mg PO Q4 hours prn   - Lidocaine patch 3 TD daily   - Opioids are being held in order for the patient to start Suboxone tomorrow  - Bowel regimen: Senokot-S 2 tabs PO BID, Miralax 34 g daily    History of Substance  Misuse:  - Diagnosis: Opioid use disorder, Stimulant use disorder, Alcohol use disorder in remission, Sedative-hypnotic use disorder in remission  - Substance(s) Used: Heroin, methamphetamine, prior alcohol, prior gabapentin  - Medical/Withdrawal Therapy: N/A  - Evaluation by Psychiatry: Yes  - Agreeable to individual therapy: TBD  - Agreeable to group therapy: TBD  - Agreeable to outpatient therapy: Yes  - Agreeable  to medical alternative therapy: Yes - transitioning to Suboxone on 02/27/2021  - Agreeable to DDU: N/A  - Agreeable to [facility]: TBD  - Agreeable to [place]: TBD  - Risk to discharge with PICC: High    Screening:  - UDS (on admission): Not done  - Syphilis Screening: Ordered  - Urine Gonorrhea/Chlamydia: Ordered  - Tdap: Discuss this admission  - SARS-CoV-2 (COVID-19) Vaccination: Discuss this admission  - SARS-CoV-2 (COVID-19) Screening: Ordered    Cleared hepatitis C   - Antibody Positive Date: 02/05/2021  - Quantitative RNA PCR Value: 02/05/2021 Target not detected  - HIV Screen: Negative  - Hep A IgG: Reactive (immune)  - Hep B Surface Ag: Negative  - Hep B Surface Ab: 497  - Hep B Core Ab: Ordered    Right hemothorax - improved  - Status post right chest tube insertion on 02/23/2021 and removal on 02/25/2021    Acute kidney injury on possible CKD 3 - overall improved  - Nephrology following  - Continue bumetanide 1 mg PO Q8 hours for now per Cardiac Surgery's plan; consider decreasing based on Nephrology's recommendation tomorrow  - Start sodium bicarbonate 650 mg PO TID  - Continue Nephrocaps 1 capsule PO daily  - Monitor BMP, magnesium, and phosphorus and I/Os    Leukocytoclastic vasculitis - improved  - Confirmed by punch biopsy pathology on 02/16/2021  - Continue triamcinolone 0.1% topical ointment BID for now    Insomnia - improved  - Continue melatonin 5 mg PO nightly    Chronic Issues:   Unspecified anxiety and depressive disorders - improved  - Continue quetiapine 100 mg PO  nightly    Tobacco use disorder  - Counsel regarding smoking cessation this admission  - Continue nicotine replacement therapy    Resolved Issues:   Dialysis requirement    DVT/PE Prophylaxis/Anticoagulation: Warfarin    Disposition: TBD    Follow Up Appointments:  TCC: Follow up approximately 1 week after discharge  Infectious Diseases: Follow up with Dr. Alfonzo Feller by telephone approximately 2 weeks after discharge with next appointment in person approximately 1 moth after discharge  Cardiac Surgery: Follow up with Dr. Renita Papa 1 month after discharge  Psychiatry: TBD    Evangeline Dakin, M.D., 02/26/2021  Assistant Professor, Sections of Infectious Diseases and [place] Department of Medicine

## 2021-02-26 NOTE — Care Management Notes (Signed)
Pt was made step-down status and transferred to 10SES[facility]. Hand-off given to CCC Ashley Burch ([phone]), who will continue to follow.  Imer Foxworth, MSW  [phone]

## 2021-02-26 NOTE — Progress Notes (Signed)
[facility]  Cardiac Surgery   Floor/Stepdown Progress Note      Randilynn, Fraze  Date of Admission:  02/05/2021  Date of Birth:  18-Jan-1986    Hospital Day:  LOS: 21 days   Post-op Day:  13 Days Post-Op, S/P On pump beating heart TVR (33 mm Epic) with closure of PFO  Date of Service:  02/26/2021    SUBJECTIVE:   No new issues today; no dyspnea or pain; eating and drinking appropriately.   Spoke with nephrology about recommendations for Na bicarb.   Waiting for position on hospitalist service.    OBJECTIVE:    Vital Signs:  Temp (24hrs) Max:36.9 C (98.4 F)      Temperature: 36.7 C (98.1 F) (02/26/21 0400)  BP (Non-Invasive): (!) 141/93 (02/26/21 0400)  MAP (Non-Invasive): 107 mmHG (02/26/21 0400)  Heart Rate: 89 (02/26/21 0400)  Respiratory Rate: 17 (02/25/21 2330)  SpO2: 97 % (02/26/21 0400)    Base (Admission) Weight:  Weight: 72.4 kg (159 lb 9.8 oz)  Weight:  Weight: 67.3 kg (148 lb 5.9 oz)    Current Inpatient Medications:  acetaminophen (TYLENOL) tablet, 650 mg, Oral, Q4H PRN  aspirin chewable tablet 81 mg, 81 mg, Oral, Daily  B complex-vitamin C-folic acid (NEPHROCAPS) capsule, 1 Capsule, Oral, Daily  bisacodyl (DULCOLAX) rectal suppository, 10 mg, Rectal, Daily PRN  bumetanide (BUMEX) 0.25 mg/mL injection, 1 mg, Intravenous, Q8H  calcium carbonate (TUMS) '500mg'$  ('200mg'$  elemental calcium) chewable tablet, 500 mg, Oral, 3x/day PRN  DAPTOmycin (CUBICIN) 550 mg in NS 50 mL IVPB, 8 mg/kg, Intravenous, Q24H  famotidine (PEPCID) tablet, 20 mg, Oral, Daily  ferric gluconate (FERRLECIT) 250 mg in NS 100 mL IVPB, 250 mg, Intravenous, Q48H  hydrALAZINE (APRESOLINE) injection 10 mg, 10 mg, Intravenous, Q4H PRN  ipratropium (ATROVENT) 0.02% nebulizer solution, 0.5 mg, Nebulization, Q4H PRN  levalbuterol (XOPENEX) 1.25 mg/ 0.5 mL nebulizer solution, 1.25 mg, Nebulization, Q4H PRN  lidocaine-menthol (LIDOPATCH) 3.6%-1.25% patch, 1 Patch, Transdermal, Daily  lidocaine-menthol (LIDOPATCH) 3.6%-1.25% patch, 1 Patch,  Transdermal, Daily  lidocaine-menthol (LIDOPATCH) 3.6%-1.25% patch, 1 Patch, Transdermal, Daily  magnesium hydroxide (MILK OF MAGNESIA) '400mg'$  per 60m oral liquid, 30 mL, Oral, Daily PRN  melatonin tablet, 6 mg, Oral, NIGHTLY  metoprolol tartrate (LOPRESSOR) tablet, 25 mg, Oral, 2x/day  miconazole nitrate (SECURA) 2% topical cream, , Apply Topically, 2x/day  nicotine polacrilex (COMMIT) lozenge, 2 mg, Oral, Q2H PRN  NS flush syringe, 10-30 mL, Intracatheter, Q8HRS  NS flush syringe, 20-30 mL, Intracatheter, Q1 MIN PRN  ondansetron (ZOFRAN) 2 mg/mL injection, 4 mg, Intravenous, Q8H PRN  oxyCODONE concentrate (ROXICODONE INTENSOL) 10 mg per 0.5 mL oral liquid, 10 mg, Oral, Q6H PRN   Or  oxyCODONE concentrate (ROXICODONE INTENSOL) 10 mg per 0.5 mL oral liquid, 5 mg, Oral, Q6H PRN  perflutren lipid microspheres (DEFINITY) 1.3 mL in NS 10 mL (tot vol) injection, 2 mL, Intravenous, Give in Cardiology  phenol (CHLORASEPTIC) 1.4% oromucosal spray, 1 Spray, Mouth/Throat, Q4H PRN  polyethylene glycol (MIRALAX) oral packet, 34 g, Oral, Daily  QUEtiapine (SEROQUEL) tablet, 100 mg, Oral, NIGHTLY  rifAMPin (RIFADIN) capsule, 300 mg, Oral, 2x/day  sennosides-docusate sodium (SENOKOT-S) 8.6-'50mg'$  per tablet, 2 Tablet, Oral, 3x/day  sevelamer carbonate (RENVELA) oral powder, 2,400 mg, Oral, 3x/day-Meals  sodium chloride 3% nebulizer solution, 3 mL, Nebulization, Q6H PRN  sodium citrate 4% (3 mL) injection syringe, 2 Syringe, Intracatheter, Q1H PRN  triamcinolone acetonide (ARISTOCORT A) 0.1% topical ointment, , Apply Topically, 2x/day  Warfarin Placeholder, , Does not apply, Daily PRN  Appropriate Home Meds restarted:  Yes    I/O:  I/O last 24 hours to current time:      Intake/Output Summary (Last 24 hours) at 02/26/2021 0643  Last data filed at 02/25/2021 2000  Gross per 24 hour   Intake 601 ml   Output 300 ml   Net 301 ml     I/O last 3 completed shifts:  In: 1011.8 [P.O.:586; I.V.:425.8]  Out: 760 [Urine:760]    Nutrition/Diet:   MNT PROTOCOL FOR DIETITIAN  DIET RENAL (60G PRO,2G NA,2G K) Restrict fluids to: 1500 ML  DIETARY ORAL SUPPLEMENTS Oral Supplements with tray: Magic Cup-Orange; LUNCH/DINNER; 1 Each      Labs:  Reviewed:  I have reviewed all lab results.  Ordered:  Daily CBC, BMP, Magnesium, and PT/INR  Anticoagulation:  Yes  Target INR:  2.0-3.0   Follow Up Arranged:  No    Radiology:    Reviewed:  I have personally reviewed all current imaging   Ordered:  Daily CXR    Microbiology:  Reviewed   - Sterile site culture (9/9): NGTD  - Postoperative BCX (9/2): Negative for over 5 days    Physical Exam:    Constitutional:  appears chronically ill, appears older than stated age, pale, cachectic, sitting up in chair NAD  Eyes:  Pupils equal and round.   ENT:  Mouth mucous membranes dry.   Neck:  supple, symmetrical, trachea midline  Respiratory:  decreased breath sounds bibasilar  Cardiovascular:  regular rate and rhythm  S1, S2 normal  pedal edema 1+ generalized  Gastrointestinal:  Soft, non-tender, Bowel sounds normal  Genitourinary:  Deferred  Musculoskeletal:  Head atraumatic and normocephalic and AROM  Integumentary:  Skin warm and dry and Anterior midline surgical incision OTA - edges well approximated without signs of infection  Neurologic:  Alert and oriented x3  Psychiatric:  Normal affect.     ASSESSMENT/PLAN:  13 Days Post-Op On pump beating heart TVR (33 mm Epic) with closure of PFO    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 / S/P PFO closure  - ASA for cardiac and Tissue TVR core measures  - BB: Lopressor 25 mg BID  - Keep K > 4.0 and Mag > 2.0  - Surgical chest tubes removed 9/1  - Epicardial Wires: Left removed and right clipped in CVICU  - Anticoagulation: Coumadin Management for Tissue TVR with INR goal 2.0-3.0   - Last INR: 1.6 after dosing with 0.5   - Coumadin Dose: held 9/11 per Dr. Carver Fila due to Hemoglobin of 7.3    - will resume today at '1mg'$   - Diuresis: Bumex 1 mg Q 8 hours as significant  improvement to peripheral edema  - Post-op EF: Normal Bi-V function    Respiratory: Atelectasis / Right Hemathorax S/P Pigtail Insertion on 9/9 - stable, improved  - Use IS and PEP  - Wean oxygen to maintain 02 Sat > 90%  - Pigtailed removed 9/11  - Up and ambulate  - Nebs PRN  - Daily CXR    Renal/GU/Endocrine: Postoperative Hyperglycemia - resolved / AKI requiring Pre/Post HD  - Last Cr : 1.9 (1.84)  - Avoid Nephrotoxic Agents   - See diuresis plan outlined in Cardiac Section above  - Nephrology following for HD needs with last HD on 8/31  - A1C: 5.8  - start bicarb tabs TID per nephro recs    Gastrointestinal: GI Ulcer PPX  - Diet: MNT PROTOCOL FOR DIETITIAN  DIET RENAL (  60G PRO,2G NA,2G K) Restrict fluids to: 1500 ML  DIETARY ORAL SUPPLEMENTS Oral Supplements with tray: Magic Cup-Orange; LUNCH/DINNER; 1 Each  - Bowel Regimen: Colace and Miralax  - PPI: Protonix 40 mg PO daily    - To be continued for one month upon discharge     ID: MRSA Bacteremia / Endocarditis   - Antibiotics: Daptomycin and Rifampin with end date of 10/13  - Continue to monitor for worsening signs of infection, fevers, or leukocytosis  - Optimal Trial discussed with patient with decent candidacy with Infusion Service (CT CAP and TTE ordered by infusion services)     Heme: Preoperative Thrombocytopenia (resolved)  Idiopathic Postoperative Anemia  - Monitor CBC for acute surgical blood loss anemia and thrombocytopenia  - Blood Products: Transfused 1 unit of PRBC on 9/10 for AM Hgb of 6.6  - Hemoglobin this morning is 9.0  - Anticoagulant: coumadin '1mg'$  tonight; INR goal 2-3    Neuro/Psych: Postoperative Surgical Pain / IVDU / Smoker ? Anxiety   - PRN acetaminophen for mild pain / fevers  - PRN Pain Meds: PO Oxycodone 5 mg / 10 mg  - Continue Seroquel HS for sleep  - Continue scheduled Melatonin for sleep  - Routine neuro exams  - Day / Night hygiene   - PT / OT/ OOB     Consults: nephro, psychiatry, dermatitis    D/C Dispo: Pending Infusion  Service vs Optimal Trial     Problem List:  Active Hospital Problems    Diagnosis   . Endocarditis       PT/OT: Yes    DVT RISK FACTORS HAVE BEN ASSESSED AND PROPHYLAXIS ORDERED (SEE RUBYONLINE - REFERENCE TOOLS - MD, DVT PROPHY OR POCKET CARD):  YES    Disposition Planning: Home with Optimal Trial vs Infusion service to complete ABX     E&M Attestation:  I independently of the faculty provider spent a total of (25) minutes in direct/indirect care of this patient including initial evaluation, review of laboratory, radiology, diagnostic studies, review of medical record, order entry and coordination of care.     Patient was seen on rounds with Dr. Ted Mcalpine with inaptient plan above discussed and formulated by S[facility] and [facility].    Claudean Severance, PA-C  02/26/2021, 06:43
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
## 2021-02-26 NOTE — Ancillary Notes (Signed)
Visited patient at bedside to complete post-TVR education and patient verbalizes understanding. Reviewed risk factors for CAD, Lifting/pushing/pulling restrictions, stair climbing, driving, wound care, diet/exercise, and Phase II Cardiac Rehab. Patient was provided handout for review. Will continue to follow up with patient to provide cardiac rehab services as tolerated.     Patient in Chair with call bell within reach.   Julianne Rice [facility] ES  Cardiac Rehab   [phone]

## 2021-02-26 NOTE — Ancillary Notes (Addendum)
Patient transferred to unit from 10SE. During belongings search AS's found some sort of crystallized substance, believed to be meth filled in patients socks and pink buckets, also found a syringe. Items confiscated and sent to security.     Patient oriented to unit policies and procedures including group, visitor searches, and outside time.    Suzzanne Cloud, ADDICTION SPECIALIST   02/26/2021, 18:12

## 2021-02-26 NOTE — Nurses Notes (Signed)
Pt transported to [facility] -664. Report called to bedside RN. Personal belongings taken with patient via wheelchair.
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
## 2021-02-26 NOTE — Nurses Notes (Signed)
Patient arrived on [place] [id number]. Patient oriented to room and unit. Call bell within reach.

## 2021-02-26 NOTE — Nurses Notes (Signed)
Arrived to 10SE-17. Alert and oriented. VSS and assessment per flowsheet. Call bell within reach and educated to call for assistance -verbalizes understanding.

## 2021-02-26 NOTE — Care Plan (Signed)
Patient alert oriented x4, up ad lib. Chest tube removed, dressing CDI. Complains of back pain, PRN pain medication given. Rash noted on BLE, medicated with scheduled ointment.   Fall precaution maintained, hourly roundings done, needs are met  Problem: Adult Inpatient Plan of Care  Goal: Plan of Care Review  Outcome: Ongoing (see interventions/notes)  Goal: Patient-Specific Goal (Individualized)  Outcome: Ongoing (see interventions/notes)  Goal: Absence of Hospital-Acquired Illness or Injury  Outcome: Ongoing (see interventions/notes)  Intervention: Identify and Manage Fall Risk  Recent Flowsheet Documentation  Taken 02/25/2021 2000 by Rudean Curt, RN  Safety Promotion/Fall Prevention:  . activity supervised  . fall prevention program maintained  Intervention: Prevent Skin Injury  Recent Flowsheet Documentation  Taken 02/25/2021 2000 by Rudean Curt, RN  Body Position: sitting  Intervention: Prevent Infection  Recent Flowsheet Documentation  Taken 02/25/2021 2000 by Rudean Curt, RN  Infection Prevention:  . barrier precautions utilized  . cohorting utilized  . environmental surveillance performed  . equipment surfaces disinfected  . promote handwashing  . rest/sleep promoted  Goal: Optimal Comfort and Wellbeing  Outcome: Ongoing (see interventions/notes)  Intervention: Provide Person-Centered Care  Recent Flowsheet Documentation  Taken 02/25/2021 2000 by Rudean Curt, RN  Trust Relationship/Rapport:  . care explained  . choices provided  . emotional support provided  . questions encouraged  Goal: Rounds/Family Conference  Outcome: Ongoing (see interventions/notes)     Problem: Skin Injury Risk Increased  Goal: Skin Health and Integrity  Outcome: Ongoing (see interventions/notes)  Intervention: Optimize Skin Protection  Recent Flowsheet Documentation  Taken 02/25/2021 2000 by Rudean Curt, RN  Pressure Reduction Techniques:  . (L,M,H,VH)Frequent Turning  . frequent weight shift  encouraged  Pressure Reduction Devices: (L.M,H,VH) Heel offloading device utilized  Skin Protection:  . adhesive use limited  . tubing/devices free from skin contact  Head of Bed [facility]) Positioning: 30 degrees     Problem: Infection Progression (Sepsis)  Goal: Absence of Infection Signs and Symptoms  Outcome: Ongoing (see interventions/notes)  Intervention: Initiate Sepsis Management  Recent Flowsheet Documentation  Taken 02/25/2021 2000 by Rudean Curt, RN  Infection Prevention:  . barrier precautions utilized  . cohorting utilized  . environmental surveillance performed  . equipment surfaces disinfected  . promote handwashing  . rest/sleep promoted  Isolation Precautions: contact precautions maintained  Intervention: Promote Stabilization  Recent Flowsheet Documentation  Taken 02/25/2021 2000 by Rudean Curt, RN  Fever Reduction/Comfort Measures:  . lightweight bedding  . lightweight clothing  Intervention: Promote Recovery  Recent Flowsheet Documentation  Taken 02/25/2021 2000 by Rudean Curt, RN  Sleep/Rest Enhancement: awakenings minimized     Problem: Fall Injury Risk  Goal: Absence of Fall and Fall-Related Injury  Outcome: Ongoing (see interventions/notes)  Intervention: Promote Injury-Free Environment  Recent Flowsheet Documentation  Taken 02/25/2021 2000 by Rudean Curt, RN  Safety Promotion/Fall Prevention:  . activity supervised  . fall prevention program maintained     Problem: Acute Rehab Services Goal & Intervention Plan  Goal: Gait Training Goal  Description: Stand Alone Therapy Goal  Outcome: Ongoing (see interventions/notes)  Goal: Interprofessional Goal  Description: Stand Alone Therapy Goal  Outcome: Ongoing (see interventions/notes)  Goal: Occupational Therapy Goals  Description: Stand Alone Therapy Goal  Outcome: Ongoing (see interventions/notes)  Goal: Physical Therapy Goal  Description: Stand Alone Therapy Goal  Outcome: Ongoing (see interventions/notes)     Problem: Pain  Acute  Goal: Acceptable Pain Control and Functional Ability  Outcome: Ongoing (see interventions/notes)  Intervention: Prevent or Manage Pain  Recent  Flowsheet Documentation  Taken 02/25/2021 2000 by Rudean Curt, RN  Sleep/Rest Enhancement: awakenings minimized  Intervention: Optimize Psychosocial Wellbeing  Recent Flowsheet Documentation  Taken 02/25/2021 2000 by Rudean Curt, RN  Diversional Activities:  . television  . smartphone  . coloring/artwork     Problem: Pain (Cardiovascular Surgery)  Goal: Acceptable Pain Control  Outcome: Ongoing (see interventions/notes)     Problem: Behavioral Health Comorbidity  Goal: Maintenance of Behavioral Health Symptom Control  Outcome: Ongoing (see interventions/notes)

## 2021-02-26 NOTE — Ancillary Notes (Signed)
[facility]  Cardiac Rehab Exercise Information      [name] [name], [name] [name], 35 y.o.   Date of Birth:  [birth year]  Allergies: No Known Allergies    EXERCISE INFORMATION:  Resting heart rate: 82    Resting blood pressure: 141/89    Oxygen Liter flow (L/min): 0 L/min    Resting SpO2: 99 %    Exercise type: Ambulate    Distance: 364f, no breaks    Rate of perceived exertion: 12/20    Pain scale: 0/10    Symptoms: Shortness of breath    Assistance: x2 standyby    Maximum heart rate: 95    Recovery heart rate: 85    Recovery blood pressure: 138/91    Recovery SpO2: 100      CJulianne RiceBPrinceton House Behavioral HealthES  Cardiac Rehab   #[phone]

## 2021-02-26 NOTE — Care Plan (Signed)
Alert and oriented. No pain during shift. OOB ad lib. Contact for MRSA/MDRO. Moderate fall precautions. Call bell within reach and educated to call for assistance - verbalizes understanding.        Problem: Adult Inpatient Plan of Care  Goal: Plan of Care Review  Outcome: Ongoing (see interventions/notes)  Goal: Patient-Specific Goal (Individualized)  Outcome: Ongoing (see interventions/notes)  Flowsheets (Taken 02/26/2021 1325)  Individualized Care Needs: OOB independently  Anxieties, Fears or Concerns: None  Patient-Specific Goals (Include Timeframe): To go home soon  Goal: Absence of Hospital-Acquired Illness or Injury  Outcome: Ongoing (see interventions/notes)  Intervention: Identify and Manage Fall Risk  Recent Flowsheet Documentation  Taken 02/26/2021 1159 by Salome Arnt, RN  Safety Promotion/Fall Prevention:   activity supervised   fall prevention program maintained   nonskid shoes/slippers when out of bed   safety round/check completed  Intervention: Prevent Skin Injury  Recent Flowsheet Documentation  Taken 02/26/2021 1159 by Salome Arnt, RN  Body Position: sitting  Intervention: Prevent and Manage VTE (Venous Thromboembolism) Risk  Recent Flowsheet Documentation  Taken 02/26/2021 1159 by Salome Arnt, RN  VTE Prevention/Management:   ambulation promoted   anticoagulant therapy maintained   dorsiflexion/plantar flexion performed  Intervention: Prevent Infection  Recent Flowsheet Documentation  Taken 02/26/2021 1159 by Salome Arnt, RN  Infection Prevention:   barrier precautions utilized   environmental surveillance performed   equipment surfaces disinfected   glycemic control managed   personal protective equipment utilized   promote handwashing   rest/sleep promoted   single patient room provided  Goal: Optimal Comfort and Wellbeing  Outcome: Ongoing (see interventions/notes)  Intervention: Provide Person-Centered Care  Recent Flowsheet Documentation  Taken 02/26/2021 1159 by Salome Arnt,  RN  Trust Relationship/Rapport:   care explained   choices provided  Goal: Rounds/Family Conference  Outcome: Ongoing (see interventions/notes)     Problem: Skin Injury Risk Increased  Goal: Skin Health and Integrity  Outcome: Ongoing (see interventions/notes)  Intervention: Optimize Skin Protection  Recent Flowsheet Documentation  Taken 02/26/2021 1159 by Salome Arnt, RN  Skin Protection:   adhesive use limited   skin-to-device areas padded   transparent dressing maintained   tubing/devices free from skin contact     Problem: Inability to Wean (Mechanical Ventilation, Invasive)  Goal: Mechanical Ventilation Liberation  Intervention: Promote Extubation and Dentist  Recent Flowsheet Documentation  Taken 02/26/2021 1159 by Salome Arnt, RN  Sleep/Rest Enhancement: consistent schedule promoted     Problem: Infection Progression (Sepsis)  Goal: Absence of Infection Signs and Symptoms  Outcome: Ongoing (see interventions/notes)  Intervention: Initiate Sepsis Management  Recent Flowsheet Documentation  Taken 02/26/2021 1159 by Salome Arnt, RN  Infection Prevention:   barrier precautions utilized   environmental surveillance performed   equipment surfaces disinfected   glycemic control managed   personal protective equipment utilized   promote handwashing   rest/sleep promoted   single patient room provided  Isolation Precautions: contact precautions maintained  Intervention: Promote Stabilization  Recent Flowsheet Documentation  Taken 02/26/2021 1159 by Salome Arnt, RN  Fever Reduction/Comfort Measures:   lightweight clothing   lightweight bedding  Intervention: Promote Recovery  Recent Flowsheet Documentation  Taken 02/26/2021 1159 by Salome Arnt, RN  Sleep/Rest Enhancement: consistent schedule promoted  Activity Management:   activity adjusted per tolerance   activity clustered for rest periods   activity encouraged     Problem: Fall Injury Risk  Goal: Absence of Fall and Fall-Related  Injury  Outcome: Ongoing (see interventions/notes)  Intervention:  Identify and Manage Contributors  Recent Flowsheet Documentation  Taken 02/26/2021 1159 by Salome Arnt, RN  Self-Care Promotion: independence encouraged  Intervention: Promote Injury-Free Environment  Recent Flowsheet Documentation  Taken 02/26/2021 1159 by Salome Arnt, RN  Safety Promotion/Fall Prevention:   activity supervised   fall prevention program maintained   nonskid shoes/slippers when out of bed   safety round/check completed     Problem: Acute Rehab Services Goal & Intervention Plan  Goal: Gait Training Goal  Description: Stand Alone Therapy Goal  Outcome: Ongoing (see interventions/notes)  Goal: Interprofessional Goal  Description: Stand Alone Therapy Goal  Outcome: Ongoing (see interventions/notes)  Goal: Occupational Therapy Goals  Description: Stand Alone Therapy Goal  Outcome: Ongoing (see interventions/notes)  Goal: Physical Therapy Goal  Description: Stand Alone Therapy Goal  Outcome: Ongoing (see interventions/notes)     Problem: Pain Acute  Goal: Acceptable Pain Control and Functional Ability  Outcome: Ongoing (see interventions/notes)  Intervention: Prevent or Manage Pain  Recent Flowsheet Documentation  Taken 02/26/2021 1159 by Salome Arnt, RN  Sleep/Rest Enhancement: consistent schedule promoted  Intervention: Optimize Psychosocial Wellbeing  Recent Flowsheet Documentation  Taken 02/26/2021 1159 by Salome Arnt, RN  Diversional Activities:   smartphone   television  Supportive Measures: active listening utilized     Problem: Adjustment to Surgery (Cardiovascular Surgery)  Goal: Optimal Coping with Heart Surgery  Intervention: Support Psychosocial Response to Surgery  Recent Flowsheet Documentation  Taken 02/26/2021 1159 by Salome Arnt, RN  Family/Support System Care:   self-care encouraged   support provided  Supportive Measures: active listening utilized     Problem: Pain (Cardiovascular Surgery)  Goal: Acceptable Pain  Control  Outcome: Ongoing (see interventions/notes)     Problem: Behavioral Health Comorbidity  Goal: Maintenance of Behavioral Health Symptom Control  Outcome: Ongoing (see interventions/notes)

## 2021-02-26 NOTE — Ancillary Notes (Signed)
[facility]  Cardiac Rehab Exercise Information      [name] [name], [name] [name], 35 y.o.   Date of Birth:  1985/06/21  Allergies: No Known Allergies    EXERCISE INFORMATION:  Resting heart rate: 94    Resting blood pressure: 122/82    Oxygen Liter flow (L/min): 0 L/min    Resting SpO2: 100 %    Exercise type: Ambulate    Distance: 373f, no breaks    Rate of perceived exertion: 12/20    Pain scale: 0/10    Symptoms: Shortness of breath    Assistance: x1 standyby    Maximum heart rate: 105    Recovery heart rate: 98    Recovery blood pressure: 131/87    [facility]2: [facility]  [facility]   #[phone]

## 2021-02-26 NOTE — Nurses Notes (Signed)
Patient alert and oriented x4. VSS. Denies having pain. Patient also denies any needs at this time. Call bell within reach.

## 2021-02-26 NOTE — Care Management Notes (Signed)
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]    Assessment:      02/26/21 1354   Assessment Details   Assessment Type Continued Assessment   Date of Care Management Update 02/26/21   Date of Next DCP Update 03/01/21   Care Management Plan   Discharge Planning Status plan in progress   Projected Discharge Date 03/29/21   CM will evaluate for rehabilitation potential yes   Discharge Needs Assessment   Equipment Needed After Discharge none   Discharge Facility/Level of Care Needs Home (Patient/Family Member/other)(code 1)   Transportation Available family or friend will provide       Discharge Plan:  Home (Patient/Family Member/other) (code 1)   Pt continues to be followed medically and is not discharge ready at this time.  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 Dietitian following. PT/OT following; PT evaluated on 9/11 and at that time rec home with assist at Dc. D/C date TBD, pending pt's progress and abx course.     The patient will continue to be evaluated for developing discharge needs.     Case Manager: Hardin Negus, [place] COORDINATOR  Phone: [phone]

## 2021-02-27 ENCOUNTER — Encounter (HOSPITAL_COMMUNITY): Payer: Self-pay

## 2021-02-27 ENCOUNTER — Other Ambulatory Visit: Payer: Self-pay

## 2021-02-27 DIAGNOSIS — Z720 Tobacco use: Secondary | ICD-10-CM

## 2021-02-27 DIAGNOSIS — Z953 Presence of xenogenic heart valve: Secondary | ICD-10-CM

## 2021-02-27 DIAGNOSIS — F419 Anxiety disorder, unspecified: Secondary | ICD-10-CM

## 2021-02-27 DIAGNOSIS — Z792 Long term (current) use of antibiotics: Secondary | ICD-10-CM

## 2021-02-27 LAB — DRUG SCREEN, NO CONFIRMATION, URINE
AMPHETAMINES, URINE: NEGATIVE
BARBITURATES URINE: NEGATIVE
BENZODIAZEPINES URINE: NEGATIVE
BUPRENORPHINE URINE: POSITIVE — AB
CANNABINOIDS URINE: NEGATIVE
COCAINE METABOLITES URINE: NEGATIVE
CREATININE RANDOM URINE: 102 mg/dL — ABNORMAL HIGH (ref 50–100)
ECSTASY/MDMA URINE: NEGATIVE
FENTANYL, RANDOM URINE: NEGATIVE
METHADONE URINE: NEGATIVE
OPIATES URINE (LOW CUTOFF): NEGATIVE
OXYCODONE URINE: POSITIVE — AB

## 2021-02-27 LAB — CBC WITH DIFF
BASOPHIL #: <0.1 10*3/uL (ref <=0.20)
BASOPHIL %: 1 %
EOSINOPHIL #: <0.1 10*3/uL (ref <=0.50)
EOSINOPHIL %: 0 %
HCT: 21.3 % — ABNORMAL LOW (ref 34.8–46.0)
HGB: 6.9 g/dL — CL (ref 11.5–16.0)
IMMATURE GRANULOCYTE #: 0.1 10*3/uL — ABNORMAL HIGH (ref <0.10)
IMMATURE GRANULOCYTE %: 1 % (ref 0–1)
LYMPHOCYTE #: 2 10*3/uL (ref 1.00–4.80)
LYMPHOCYTE %: 25 %
MCH: 26.7 pg (ref 26.0–32.0)
MCHC: 31.9 g/dL (ref 31.0–35.5)
MCV: 83.5 fL (ref 78.0–100.0)
MONOCYTE #: 0.66 10*3/uL (ref 0.20–1.10)
MONOCYTE %: 8 %
MPV: 8.1 fL — ABNORMAL LOW (ref 8.7–12.5)
NEUTROPHIL #: 5.21 10*3/uL (ref 1.50–7.70)
NEUTROPHIL %: 65 %
PLATELETS: 265 10*3/uL (ref 150–400)
RBC: 2.55 10*6/uL — ABNORMAL LOW (ref 3.85–5.22)
RDW-CV: 17.9 % — ABNORMAL HIGH (ref 11.5–15.5)
WBC: 8.1 10*3/uL (ref 3.7–11.0)

## 2021-02-27 LAB — BASIC METABOLIC PANEL
ANION GAP: 12 mmol/L (ref 4–13)
BUN/CREA RATIO: 31 — ABNORMAL HIGH (ref 6–22)
BUN: 58 mg/dL — ABNORMAL HIGH (ref 8–25)
CALCIUM: 8.5 mg/dL (ref 8.5–10.0)
CHLORIDE: 109 mmol/L (ref 96–111)
CO2 TOTAL: 17 mmol/L — ABNORMAL LOW (ref 22–30)
CREATININE: 1.85 mg/dL — ABNORMAL HIGH (ref 0.60–1.05)
ESTIMATED GFR: 36 mL/min/BSA — ABNORMAL LOW (ref >=60)
GLUCOSE: 102 mg/dL (ref 65–125)
POTASSIUM: 3.9 mmol/L (ref 3.5–5.1)
SODIUM: 138 mmol/L (ref 136–145)

## 2021-02-27 LAB — PHOSPHORUS: PHOSPHORUS: 6.5 mg/dL — ABNORMAL HIGH (ref 2.4–4.7)

## 2021-02-27 LAB — HEPATIC FUNCTION PANEL
ALBUMIN: 2.8 g/dL — ABNORMAL LOW (ref 3.5–5.0)
ALKALINE PHOSPHATASE: 112 U/L — ABNORMAL HIGH (ref 40–110)
ALT (SGPT): 10 U/L (ref 8–22)
AST (SGOT): 19 U/L (ref 8–45)
BILIRUBIN DIRECT: 0.4 mg/dL (ref 0.1–0.4)
BILIRUBIN TOTAL: 0.7 mg/dL (ref 0.3–1.3)
PROTEIN TOTAL: 7.1 g/dL (ref 6.4–8.3)

## 2021-02-27 LAB — C-REACTIVE PROTEIN(CRP),INFLAMMATION: CRP INFLAMMATION: 52.2 mg/L — ABNORMAL HIGH (ref <8.0)

## 2021-02-27 LAB — CROSSMATCH RED CELLS - UNITS

## 2021-02-27 LAB — SYPHILIS SCREENING ALGORITHM WITH REFLEX, SERUM: SYPHILIS TP ANTIBODIES: NONREACTIVE

## 2021-02-27 LAB — COVID-19 ~~LOC~~ MOLECULAR LAB TESTING: SARS-CoV-2: NOT DETECTED

## 2021-02-27 LAB — PT/INR
INR: 1.45 — ABNORMAL HIGH (ref 0.80–1.20)
PROTHROMBIN TIME: 16.8 seconds — ABNORMAL HIGH (ref 9.1–13.9)

## 2021-02-27 LAB — MAGNESIUM: MAGNESIUM: 2 mg/dL (ref 1.8–2.6)

## 2021-02-27 MED ORDER — BUMETANIDE 0.25 MG/ML INJECTION SOLUTION
1.0000 mg | Freq: Every day | INTRAMUSCULAR | Status: DC
Start: 2021-02-28 — End: 2021-03-01
  Administered 2021-02-28: 1 mg via INTRAVENOUS
  Filled 2021-02-27 (×2): qty 4

## 2021-02-27 MED ORDER — LINEZOLID 600 MG TABLET
600.0000 mg | ORAL_TABLET | Freq: Two times a day (BID) | ORAL | 1 refills | Status: AC
Start: 2021-03-02 — End: 2021-03-30
  Filled 2021-02-27 (×2): qty 28, 14d supply, fill #0
  Filled 2021-03-14: qty 28, 14d supply, fill #1

## 2021-02-27 MED ORDER — RIFAMPIN 300 MG CAPSULE
300.0000 mg | ORAL_CAPSULE | Freq: Every day | ORAL | 0 refills | Status: AC
Start: 2021-03-02 — End: 2021-03-30
  Filled 2021-02-27 (×2): qty 28, 28d supply, fill #0

## 2021-02-27 MED ORDER — SODIUM BICARBONATE 650 MG TABLET
1300.0000 mg | ORAL_TABLET | Freq: Three times a day (TID) | ORAL | Status: DC
Start: 2021-02-27 — End: 2021-03-02
  Administered 2021-02-27 – 2021-03-02 (×10): 1300 mg via ORAL
  Filled 2021-02-27 (×10): qty 2

## 2021-02-27 MED ORDER — SODIUM CHLORIDE 0.9 % IV BOLUS
40.0000 mL | INJECTION | Freq: Once | Status: AC | PRN
Start: 2021-02-27 — End: 2021-02-27

## 2021-02-27 MED ORDER — LINEZOLID 600 MG TABLET
600.0000 mg | ORAL_TABLET | Freq: Two times a day (BID) | ORAL | Status: DC
Start: 2021-03-02 — End: 2021-03-02
  Administered 2021-03-02: 600 mg via ORAL
  Filled 2021-02-27: qty 1

## 2021-02-27 MED ORDER — WARFARIN 1 MG TABLET
1.0000 mg | ORAL_TABLET | Freq: Once | ORAL | Status: AC
Start: 2021-02-27 — End: 2021-02-27
  Administered 2021-02-27: 1 mg via ORAL
  Filled 2021-02-27: qty 1

## 2021-02-27 NOTE — Consults (Signed)
.  PostOPerative AnTIbiotic MAnagement Duration FoLlowing Surgery for IVDA Endocarditis (OPTIMAL) Trial    This patient, [name], has been enrolled in the OPTIMAL Trial and randomized to the Oral Antibiotic Group using NIH principles.    The following antibiotic regimen has been selected based on the identified organism's MIC values:      Antibiotic     Dose     Frequency     Start Date     Stop Date       Linezolid   600 mg   Q12 hours   03/02/2021   03/29/2021     Rifampin   300 mg   Q12 hours   03/02/2021   03/29/2021     Please contact Dr. Alfonzo Feller (pager [phone] or staff message) or Dr. Gloriajean Dell (pager [phone]) with any questions regarding this patient's antibiotic regimen.  For any other clinical trial related questions, please contact Dr. Alfonzo Feller (pager [phone] or staff message).    Evangeline Dakin, M.D., 02/27/2021  Assistant Professor, Sections of Infectious Diseases and [place] Department of Medicine

## 2021-02-27 NOTE — Care Plan (Signed)
[place]  Occupational Therapy Progress Note    Patient Name: [name] [name]  Date of Birth: 11-10-1985  Height:  165.1 cm (5' 5" )  Weight:  67.6 kg (149 lb 0.5 oz)  Room/Bed: 664/A  Payor: [place]cia [place] MEDICAID / Plan: [employer] [place] MEDICAID / Product Type: Medicaid [place] /     Assessment:    Patient tolerated OT treatment session well. Patient currently able to complete ADL tasks and functional transfers/mobility independently while maintaining sternal precautions. Patient reports no concerns with d/c home once medically stable and stated that she has assistance available if needed. From OT perspective, once medically stable, patient would be safe to d/c home with assistance. OT will sign off at this time as patient has currently met all acute OT goals.      Discharge Needs:   Equipment Recommendation: shower chair    The patient presents with mobility limitations due to impaired functional activity tolerance that significantly impair/prevent patient's ability to participate in mobility-related activities of daily living (MRADLs) including  bathing. This functional mobility deficit can be sufficiently resolved with the use of a shower chair in order to decrease the risk of falls, morbidity, and mortality in performance of these MRADLs.    Discharge Disposition:  home with assist     Plan:   D/C OT order.    The risks/benefits of therapy have been discussed with the patient/caregiver and he/she is in agreement with the established plan of care.     Subjective & Objective:      02/27/21 1015   Therapist Pager   OT Assigned/ Pager # [phone]   Rehab Session   Document Type therapy progress note (daily note)   Total OT Minutes: 8   Patient Effort good   Symptoms Noted During/After Treatment none   General Information   Patient Profile Reviewed yes   General Observations of Patient Patient agreeable to OT treatment session. Patient seen with professional assist of PT.    Patient/Family/Caregiver Comments/Observations Patient reports that she has been completing all self-care tasks and functional transfers/mobility independently. Patient reports that she has no concerns about her functional status.   Medical Lines PICC Line   Respiratory Status room air   Existing Precautions/Restrictions full code;sternal precautions   Pre Treatment Status   Pre Treatment Patient Status Patient sitting in bedside chair or w/c;Call light within reach;Telephone within reach;Nurse approved session   Support Present Pre Treatment  Nurse present   Communication Pre Treatment  Nurse   Mutuality/Individual Preferences   Individualized Care Needs Up ad lib; promote participation in self-care tasks   Vital Signs   O2 Delivery Pre Treatment room air   O2 Delivery Post Treatment room air   Pain Assessment   Pre/Posttreatment Pain Comment Patient denied having any pain.   Coping/Psychosocial   Observed Emotional State calm;cooperative   Verbalized Emotional State acceptance   Coping/Psychosocial Response Interventions   Plan Of Care Reviewed With patient   Cognitive Assessment/Interventions   Behavior/Mood Observations alert;cooperative   Attention WNL/WFL   Follows Commands WFL   Mobility Assessment/Training   Mobility Comment Patient completed functional mobility of household distance unsupported with independence.   Bed Mobility Assessment/Treatment   Comment Patient OOB upon arrival. Patient reports completing bed mobility independently and with no concerns.   Transfer Assessment/Treatment   Sit-Stand Independence independent   Stand-Sit Independence independent   Transfer Comment Patient transferred from sitting in recliner chair to standing and standing to sitting  in recliner chair with independence.   Bathing Assessment/Training   Comment Patient reports that she has been bathing independently and has no concerns with completing bathing tasks while maintaining sternal precautions.   Upper Body Dressing  Assessment/Training   Comment Patient fully dressed upon arrival. Patient reports that she completed all dressing tasks independently.   Lower Body Dressing Assessment/Training   Position sitting   DRESSING ASSESSED Doff Socks;Don Socks   Independence Level  independent   Toileting Assessment/Training   Comment Patient declined need to toilet at this time. Patient reports that she has been completing toileting tasks independently and has no concerns. Nursing staff confirmed patient's reports.   Grooming Assessment/Training   Comment Patient declined grooming tasks at this time as she had already straightened and fixed her hair and completed oral hygiene at sink. Patient reports that she has been completing grooming tasks independently. Nursing staff confirm patient's reports.   Balance Skill Training   Sitting Balance: Static normal balance   Sitting, Dynamic (Balance) normal balance   Sit-to-Stand Balance good balance   Standing Balance: Static good balance   Standing Balance: Dynamic good balance   Post Treatment Status   Post Treatment Patient Status Patient sitting in bedside chair or w/c;Call light within reach;Telephone within reach   Support Present Post Treatment  Nurse present;Other (See comments)  (addiction specialist)   Communication Post Treatement Nurse   Grooming Goal   Grooming Goal, Date Goal Reviewed/Revised 02/27/21   Grooming Goal, Outcome goal met   LB Dressing Goal   LB Dressing Goal, Date Goal Reviewed/Revised 02/27/21   LB Dressing Goal, Outcome goal met   Toileting Goal   Toileting Goal, Date Goal Reviewed/Revised 02/27/21   Toileting Goal, Outcome goal met   Transfer Training Goal 2   Transfer Training Goal, Date Reviewed/Revised 02/27/21   Transfer Training Goal, Outcome goal met   UB Dressing Goal   UB Dressing Goal, Date Goal Reviewed/Revised 02/27/21   UB Dressing Goal, Outcome goal met   Clinical Impression   Functional Level at Time of Session Patient tolerated OT treatment session  well. Patient currently able to complete ADL tasks and functional transfers/mobility independently while maintaining sternal precautions. Patient reports no concerns with d/c home once medically stable and stated that she has assistance available if needed. From OT perspective, once medically stable, patient would be safe to d/c home with assistance. OT will sign off at this time as patient has currently met all acute OT goals.   Anticipated Equipment Needs at Discharge shower chair   Anticipated Discharge Disposition home with assist   Discharge Summary, OT Eval   Reason for Discharge patient met all goals and outcomes       Therapist:   Jeronimo Greaves, OTR/L  Pager #: [phone]

## 2021-02-27 NOTE — Care Management Notes (Addendum)
CCC received a call from [place] in the discharge pharmacy. Per Alden Benjamin the patients insurance in not in effect. The cash price for the Zyvox is $94.90, CM price is $16.43, Rifampin cash price $125.93, CM price $8.03.     1545 CCC called [facility] [phone]. Petronila left a message. [place] awaiting return call.   The patients medicaid termed at the end of August 2022.     [facility] spoke with supervisor K. Z. She will look at the case and send a referral to Patient Matters if deemed necessary. The patient is not discharging until Friday 03-02-2021. CCC will check with supervisor K.Z. in the am.     [address] [place] received a call from [place]., the patient will need to contact [facility] to restart her Medicaid. Per supervisor [facility] can apply CM pricing for the abx for the patient at the above cost. Hilda in discharge pharmacy aware [phone].  CCC also made service aware.

## 2021-02-27 NOTE — Progress Notes (Signed)
[facility] SERV[facility] PROGRESS NOTE    Name: [name] [name] Bed: 664/A   Age: 35 y.o. 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  She denies chest pain, shortness of breath, fevers, and chills.  She states her lower extremity swelling and rash are much improved.  She confirms she will be receiving PRBCs 1 unit today.  She relates tolerating her current antimicrobial regimen with daptomycin and rifampin without gastrointestinal upset, pruritus, or new rash.  She states her right upper extremity PICC is drawing and flushing well.  She reports she is doing well after transitioning to Suboxone this morning.  She confirms she will be staying in [place] for at least 3 months (and maybe indefinitely) prior to relocating in [place] with family.  She states her mother frequently visits [place] from [facility] and will be able to bring her to follow-up visits beyond 3 months (6 months and 12 months).    REVIEW OF SYSTEMS:  Constitutional: Denies fevers and chills  Cardiac: Denies chest pain and shortness of breath  Gastrointestinal: Denies nausea and emesis    OBJECTIVE:  I have reviewed the vital signs.   Vital Signs:  Last 24 Hours Last Check   Temp  Avg: 36.8 C (98.2 F)  Min: 36.5 C (97.7 F)  Max: 37.3 C (99.1 F) Temperature: 36.6 C (97.9 F) (02/27/21 1521)   Pulse  Avg: 75  Min: 71  Max: 83 Heart Rate: 71 (02/27/21 1521)   BP  Min: 129/72  Max: 154/89 BP (Non-Invasive): (!) 154/89 (02/27/21 1521)   Resp  Avg: 16.5  Min: 16  Max: 18 Respiratory Rate: 16 (02/27/21 1521)    SpO2  Avg: 98.4 %  Min: 97 %  Max: 100 % SpO2: 99 % (02/27/21 1521)   MAP (Non-Invasive)  Avg: 103.4 mmHG  Min: 91 mmHG  Max: 110 mmHG     Height: 165.1 cm (5'  5")   Weight: 67.6 kg (149 lb 0.5 oz) (02/26/21 0654)     FiO2: Oxygen Therapy  SpO2: 99 %  Pulse Ox Frequency: continuous  HR-SpO2: 85 bpm  O2 Delivery Source: None (Room Air)  Flow (L/min): 2  Set PEEP: 5 cmH2O  Oxygen Concentration (%): 21  FiO2: 40 %    I/O:  Date 02/27/21 0800 - 02/28/21 0759   Shift 0800-0759 24 Hour Total   INTAKE   P.O. 360 360     Oral 360 360   I.V. 40 40     Med (IV) Flush Volume 40 40   Blood 283 283     Volume (mL) (NURSING--TRANSFUSE RED BLOOD CELLS - UNITS) 283 283   Shift Total 683 683   OUTPUT   Urine 200 200     Urine (Voided) 200 200     Urine Occurrence 5 x 5 x   Stool       Stool Occurrence 4 x 4 x   Shift Total 200 200   NET 483 483      Emesis:     None  BM:    Date of Last Bowel Movement: 02/27/21  Heme:     N/A  Nutrition/Residuals:  MNT PROTOCOL FOR DIETITIAN  DIET RENAL (60G PRO,2G NA,2G K) Restrict fluids to: 1500 ML  DIETARY ORAL SUPPLEMENTS Oral Supplements with tray: Magic Cup-Orange; LUNCH/DINNER; 1 Each    Blood Sugars:   No results found for this encounter      Physical Exam:  General: Well-appearing female.  She looks comfortable sitting up in her bedside chair without acute distress.  Eyes: Pupils equal and round bilaterally.  No conjunctival injection or scleral icterus.  ENT: Face symmetrical.  Mucous membranes pink and moist.  Edentulous.  Neck: Supple.  Trachea midline.  Lungs: Decreased breath sounds in right lung base versus left side and otherwise clear to auscultation bilaterally.  No wheezes or crackles.  Unlabored respirations on room air.  Cardiovascular: Regular rate and rhythm.  No murmur.  Right upper extremity PICC present with insertion site dressed.  Abdomen: Nondistended, soft, and nontender to palpation.  Extremities: No joint erythema or swelling.  Trace pitting edema of bilateral lower extremities.  Skin: Warm and dry.  Resolving vasculitic rash on lower legs.  Neurologic: Alert and oriented X 3.  Moves all extremities without focal  deficit.  Psychiatric: Pleasant and conversant.  Appropriate affect and behavior.  Intact memory.    Labs:  I have reviewed the lab results.    CBC:  Recent Labs     02/25/21  0524 02/25/21  0651 02/26/21  0509 02/27/21  0544   WBC 7.9  --  6.5 8.1   HGB 6.8* 7.3* 9.0* 6.9*   HCT 20.7* 22.2* 27.3* 21.3*   PLTCNT 306  --  262 265     Differential:   Recent Labs     02/25/21  0524 02/26/21  0509 02/27/21  0544   PMNS 72 72 65   MONOCYTES _0 BASOPHILS 1  <0.10 1  <0.10 1  <0.10   PMNABS 5.69 4.63 5.21   LYMPHSABS 1.45 1.26 2.00   MONOSABS 0.54 0.41 0.66   EOSABS <0.10 <0.10 <0.10     BMP:  Recent Labs     02/25/21  0524 02/26/21  0509 02/27/21  0544   SODIUM 135* 135* 138   POTASSIUM 3.9 3.8 3.9   CHLORIDE 106 106 109   CO2 18* 17* 17*   BUN 59* 61* 58*   CREATININE 1.81* 1.90* 1.85*   GLUCOSENF 97 110 102   ANIONGAP _1 GFR 37* 35* 36*   CALCIUM 8.5 8.4* 8.5   MAGNESIUM 2.3 2.2 2.0   PHOSPHORUS 7.4* 7.3* 6.5*     RBC Indices:  Recent Labs     02/25/21  0524 02/26/21  0509 02/27/21  0544   MCV 82.1 81.3 83.5   MCH 27.0 26.8 26.7   MCHC 32.9 33.0 31.9   MPV 8.1* 8.0* 8.1*     Coagulation Studies:  Recent Labs     02/25/21  0524 02/26/21  0509 02/27/21  0545   INR 1.65* 1.61* 1.45*   PROTHROMTME 19.2* 18.7* 16.8*     Liver/Pancreas:  Recent Labs     02/27/21  0544   TOTALPROTEIN 7.1   ALBUMIN 2.8*   AST 19   ALT 10   ALKPHOS 112*   BILIRUBINCON 0.4   TOTBILIRUBIN 0.7     Inflammatory Markers:  Recent Labs     02/26/21  0509 02/27/21  0544   CREAPROINFLA 60.7* 52.2*     Antimicrobial Markers:  No results for input(s): VANCOMYCINTR, CPK  in the last 72 hours.    Radiology:   I have reviewed the images.   Results for orders placed or performed during the hospital encounter of 02/05/21 (from the past 72 hour(s))   XR AP MOBILE CHEST     Status: None    Narrative    XR AP MOBILE CHEST performed on 02/25/2021 6:09 AM    INDICATION: 35 years old Female  S/P TVR with Pigtail placed 9/9 for right hemathorax - follow  up status for removal    TECHNIQUE: 1 view(s) of the chest; 1 image(s)    COMPARISON: Chest radiograph 02/24/2021.    FINDINGS: Right pigtail chest tube is in stable position. There is persistent right pleural fluid and atelectasis. Hazy left hemithorax opacity is similar in appearance given differences in technique. No pneumothorax.        Impression    Persistent right pleural fluid and atelectasis with unchanged left hazy opacity.   XR AP MOBILE CHEST     Status: None    Narrative    [name] [name]  Female, 35 years old.    XR AP MOBILE CHEST performed on 02/26/2021 6:09 AM.    REASON FOR EXAM:  s/p tvr    TECHNIQUE: 1 view/1 image(s) submitted for interpretation.    FINDINGS: Compared to 02/25/2021. There is a shallow inspiration. The heart is stable in size. Bilateral patchy opacities and nodules are noted. No visible pneumothorax. Metallic foreign body projects within the soft tissues of the base of the right neck, likely broken needle fragment.    Right pigtail chest tube has been removed. Right PICC is stable.        Impression    Stable lung aeration.         CT CHEST ABDOMEN PELVIS WO IV CONTRAST     Status: None    Narrative    [name] [name]  Female, 35 years old.    CT CHEST ABDOMEN PELVIS WO IV CONTRAST performed on 02/26/2021 9:22 AM.    REASON FOR EXAM:  Known history of native tricuspid valve endocarditis with septic pulmonary emboli status-post tricuspid valve replacement - exam ordered to assess for areas of active infection prior to enrollment in a clinical trial (OPTIMAL Trial)    RADIATION DOSE: 916.50 mGycm    COMPARISON: CT chest abdomen pelvis 02/05/2021    FINDINGS:   CHEST:  LUNGS/PLEURA: Redemonstration of numerous solid and cavitary pulmonary masses bilaterally consistent with septic emboli. The largest in the right lower lobe appears improved in size versus fluid within the cavitary component and lower lung volumes seen in the right series 3, image 205. The groundglass opacities and interlobular  septal thickening have improved from prior exam. More consolidative opacities and right pleural effusion however have mildly increased in size. There appears to be a trace hydropneumothorax on the right series 2, image 61.    HEART/MEDIASTINUM: Heart size is mildly prominent with questionable loculated right pleural effusion versus pericardial effusion as seen on series 2, image 64.Marland Kitchen Postsurgical changes from tricuspid valve replacement is noted. Mediastinal structures are otherwise not well assessed due to inflammation and consolidation within the anterior mediastinum likely postsurgical changes. The aorta and pulmonary arteries appear within normal limits of size. Thyroid appears within normal limits. Right subclavian catheter with the tip in the low SVC right atrial junction is noted.    LYMPH NODES: No enlarged axillary lymph nodes with numerous subcentimeter lymph nodes noted.    BONES/SOFT TISSUES: Postsurgical changes from median  sternotomy are noted. No acute vertebral height loss within the thoracic spine. Diffuse anasarca is noted.    ABDOMEN/PELVIS:   LIVER: Liver is enlarged measuring 25.3 cm in length along the right hepatic lobe.    BILIARY SYSTEM/GALLBLADDER: No biliary dilatation. Gallbladder is mildly distended without wall thickening.    PANCREAS: Within normal limits.    KIDNEY/URETERS: No renal calculi or hydronephrosis.    ADRENALS: No definite adrenal nodules.    SPLEEN: Spleen is enlarged measuring 16.8 cm in length.    BOWEL: No abnormally dilated small bowel loops are seen. Scattered stool throughout the colon without evidence of obstruction.    VASCULATURE/LYMPH NODES: Visualized portion of the aorta is within normal limits. Difficult assessment for adenopathy due to lack of IV contrast and nonopacification of numerous bowel loops.    BLADDER: No asymmetric bladder wall thickening.    REPRODUCTIVE ORGANS: Uterus is not well-visualized.    PERITONEAL CAVITY: Small amount of ascites in the  pelvis.    BONES/SOFT TISSUES: No acute vertebral height loss within the lumbar spine. Mild anasarca is noted.      Impression    1.Postsurgical changes from tricuspid valve repair with loculated fluid along the right heart border which may represent a loculated pericardial effusion versus loculated pleural effusion.  2.Redemonstration of numerous septic emboli in the bilateral lungs with improving groundglass and intralobular septal thickening. There is however mild interval worsening of small right pleural effusion and right basilar consolidation with trace hydropneumothorax on the right.  3.Marked hepatosplenomegaly.  4.Persistent small amount of ascites in the pelvis.       EKG:   I have reviewed the tracings.   Recent Results (from the past 720 hour(s))   ECG 12-LEAD    Collection Time: 02/05/21  1:00 AM   Result Value    Ventricular rate 99    Atrial Rate 99    PR Interval 136    QRS Duration 86    QT Interval 338    QTC Calculation 433    Calculated P Axis 41    Calculated R Axis 13    Calculated T Axis 21    Narrative    Normal sinus rhythm  Normal ECG  No previous ECGs available  Preliminary by fellow Harlow Asa, MD, AHMED [phone]) on 02/05/2021 1:22:59 PM  Confirmed by Pasty Spillers MD, ANTHONY ([id number]) on 02/05/2021 7:19:09 PM     Microbiology:   I have reviewed the microbiology.  Hospital Encounter on 02/05/21 (from the past 96 hour(s))   ADULT ROUTINE BLOOD CULTURE, SET OF 2 ADULT BOTTLES (BACTERIA AND YEAST)    Collection Time: 02/26/21  3:23 AM    Specimen: Blood   Culture Result Status    BLOOD CULTURE, ROUTINE No Growth 18-24 hrs. Preliminary   ADULT ROUTINE BLOOD CULTURE, SET OF 2 ADULT BOTTLES (BACTERIA AND YEAST)    Collection Time: 02/26/21 10:41 AM    Specimen: Blood   Culture Result Status    BLOOD CULTURE, ROUTINE No Growth 18-24 hrs. Preliminary     Pathology:   I have reviewed the pathology.   Results for orders placed or performed during the hospital encounter of 02/05/21   SURGICAL PATHOLOGY SPECIMEN    Result Value    Final Diagnosis      A.  SKIN, RIGHT LOWER EXTREMITY, PUNCH BIOPSY:  - Leukocytoclastic vasculitis.  (See Comment.)    B.  SKIN, RIGHT LOWER EXTREMITY, PUNCH BIOPSY FOR DIRECT IMMUNOFLUORESCENCE:   - Deposits of IgM, C3  and fibrinogen in a perivascular pattern in the upper dermis.   - No specific deposits of IgG and IgA identified on cryostat examined sections.      Diagnosis Comment      A.  The specimen shows perivascular infiltrate of neutrophils with fragmented nuclear debris (leukocytoclasis). The neutrophils are extending into the epidermis with neutrophilic exocytosis. There is extravasation of erythrocytes in the dermis and fibrinoid necrosis of blood vessels. PAS fungal stain is negative for fungal microorganisms. 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 Clinical correlation is recommended.    B.  The immunofluorescent features are consistent with leukocytoclastic vasculitis.   (The immunoreactant and special stain controls were appropriately reactive       Microscopic Description      Performed.      Gross Description      A. Skin.  Part A is received in formalin labeled "[name] [name], [name] [name]" and "right lower extremtiy". It consists of a 0.4 x 0.4 cm circular piece of tan skin excised to a depth of 0.4 cm.  The specimen is bisected and submitted entirely in cassette A1.    B. Skin.  Part B is received in [employer] labeled "[name] [name], [name] [name]" and "right lower extremtiy". It consists of a 0.4 x 0.4 cm circular piece of tan skin excised to a depth of 0.4 cm.  The specimen is submitted entirely for immunofluorescence studies.     SURGICAL PATHOLOGY SPECIMEN   Result Value    Final Diagnosis      Tricuspid valve, resection:  . Valve tissue with fibrosis and adherent vegetations containing numerous colonies of bacterial organisms, consistent with infective endocarditis      Microscopic Description      Performed.      Clinical History      . Tricuspid regurgitation -  severe  . MRSA Bacteremia  . IV Drug Abuse  . Septic Emboli      Gross Description      A. Heart.  Part A is received in formalin, labeled "[name] [name], [name] [name]" and "tricuspid valve". It consists of a 2.5 x 2.0 x 0.5 cm aggregate of tan-pink hemorrhagic shaggy pliable valve tissue with attached red-brown vegetations.  The specimen is submitted in toto in cassette A1.      Addendum Report      Special stains for microorganisms (Gram and GMS) were performed, with appropriate controls.  These confirm numerous colonies of predominantly Gram positive coccal organisms.  GMS stain further highlights the nonviable forms.       PT/OT: Yes    Consults:   IP CONSULT TO NEPHROLOGY - ORDERING PROVIDER MUST CALL/PAGE SERVICE  IP CONSULT TO INFECTIOUS DISEASES - ORDERING PROVIDER MUST CALL/PAGE SERVICE  IP CONSULT TO NUTRITION SERVICES  IP CONSULT TO CARE MANAGEMENT  SUICIDE RISK ASSESSMENT - CONSULT (IP ONLY)  IP CONSULT TO DERMATOLOGY - ORDERING PROVIDER MUST CALL/PAGE SERVICE  IP CONSULT TO PSYCHIATRY - SUBSTANCE ABUSE DISORDER - ORDERING PROVIDER MUST CALL/PAGE SERVICE     Current Inpatient Medications: acetaminophen (TYLENOL) tablet, 650 mg, Oral, Q4H PRN  aspirin chewable tablet 81 mg, 81 mg, Oral, Daily  B complex-vitamin C-folic acid (NEPHROCAPS) capsule, 1 Capsule, Oral, Daily  bisacodyl (DULCOLAX) rectal suppository, 10 mg, Rectal, Daily PRN  [START ON 02/28/2021] bumetanide (BUMEX) 0.25 mg/mL injection, 1 mg, Intravenous, Daily  buprenorphine-naloxone (SUBOXONE) 2-0.2m per sublingual film, 2 Film, Sublingual, 2x/day  calcium carbonate (TUMS) 5047m(20044mlemental calcium) chewable tablet, 500 mg, Oral, 3x/day PRN  DAPTOmycin (CUBICIN)  550 mg in NS 50 mL IVPB, 8 mg/kg, Intravenous, Q24H  famotidine (PEPCID) tablet, 20 mg, Oral, Daily  hydrALAZINE (APRESOLINE) injection 10 mg, 10 mg, Intravenous, Q4H PRN  ipratropium (ATROVENT) 0.02% nebulizer solution, 0.5 mg, Nebulization, Q4H PRN  lidocaine-menthol (LIDOPATCH) 3.6%-1.25%  patch, 1 Patch, Transdermal, Daily  lidocaine-menthol (LIDOPATCH) 3.6%-1.25% patch, 1 Patch, Transdermal, Daily  lidocaine-menthol (LIDOPATCH) 3.6%-1.25% patch, 1 Patch, Transdermal, Daily  [START ON 03/02/2021] linezolid (ZYVOX) tablet, 600 mg, Oral, 2x/day  magnesium hydroxide (MILK OF MAGNESIA) 440m per 538moral liquid, 30 mL, Oral, Daily PRN  melatonin tablet, 6 mg, Oral, NIGHTLY  metoprolol tartrate (LOPRESSOR) tablet, 25 mg, Oral, 2x/day  miconazole nitrate (SECURA) 2% topical cream, , Apply Topically, 2x/day  nicotine polacrilex (COMMIT) lozenge, 2 mg, Oral, Q2H PRN  NS flush syringe, 10-30 mL, Intracatheter, Q8HRS  NS flush syringe, 20-30 mL, Intracatheter, Q1 MIN PRN  ondansetron (ZOFRAN) 2 mg/mL injection, 4 mg, Intravenous, Q8H PRN  perflutren lipid microspheres (DEFINITY) 1.3 mL in NS 10 mL (tot vol) injection, 2 mL, Intravenous, Give in Cardiology  polyethylene glycol (MIRALAX) oral packet, 34 g, Oral, Daily  QUEtiapine (SEROQUEL) tablet, 100 mg, Oral, NIGHTLY  rifAMPin (RIFADIN) capsule, 300 mg, Oral, 2x/day  sennosides-docusate sodium (SENOKOT-S) 8.6-50mg per tablet, 2 Tablet, Oral, 2x/day  sevelamer carbonate (RENVELA) oral powder, 2,400 mg, Oral, 3x/day-Meals  sodium bicarbonate tablet, 1,300 mg, Oral, 3x/day  sodium chloride 3% nebulizer solution, 3 mL, Nebulization, Q6H PRN  sodium citrate 4% (3 mL) injection syringe, 2 Syringe, Intracatheter, Q1H PRN  triamcinolone acetonide (ARISTOCORT A) 0.1% topical ointment, , Apply Topically, 2x/day  warfarin (COUMADIN) tablet, 1 mg, Oral, Once 2100  Warfarin Placeholder, , Does not apply, Daily PRN    Allergies:  No Known Allergies    ASSESSMENT/PLAN:    Inpatient Summary: 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  [facility] with drug overdose, and she was admitted there 02/01/2021-02/04/2021 prior to being transferred to our facility.  She was intubated for airway protection.  She had progression of acute kidney injury to anuric acute renal failure requiring dialysis through a temporary dialysis catheter.  She developed anemia requiring blood transfusion and thrombocytopenia.  Blood cultures 02/01/2021-02/04/2021 grew MRSA (vancomycin MIC 2, rifampin MIC 1) with clearance of bacteremia not achieved at that time.  Chest radiograph on 01/31/2021 showed right middle lobe patchy opacity concerning for aspiration, pulmonary vascular congestion, and cardiomegaly.  CT brain without contrast on 01/31/2021 showed no acute intracranial abnormality.  Renal ultrasound on 02/02/2021 was unremarkable.  Transthoracic echocardiogram on 02/03/2021 showed left ventricular ejection fraction 55-60%, large tricuspid vegetation 1.91 cm X 1.67 cm with moderate tricuspid regurgitation, and trivial mitral regurgitation.  Repeat CT brain without contrast on 02/04/2021 showed no acute intracranial abnormality.  CT chest without contrast on 02/04/2021 showed multiple bilateral pulmonary nodules (solid, groundglass, and cavitary) predominantly located peripherally consistent with septic pulmonary emboli, small bilateral pleural effusion, and marked splenomegaly.  The patient received ceftriaxone on 01/31/2021, vancomycin 02/02/2021-02/04/2021, piperacillin-tazobactam 02/01/2021-02/02/2021, and cefepime and metronidazole 02/02/2021-02/04/2021.  Following arrival at our facility, blood cultures 02/05/2021-02/15/2021  grew MRSA (vancomycin MIC 1, daptomycin MIC 0.5,  ceftaroline susceptible, rifampin MIC <=0.5) with clearance of bacteremia ultimately achieved postoperatively on 02/16/2021.  BAL cultures on 02/05/2021 and 02/13/2021 also grew MRSA.  Transthoracic echocardiogram on 02/05/2021 showed left ventricular ejection fraction 62.6%, tricuspid vegetation 1.4 cm X 0.8 cm with mild tricuspid regurgitation, and normal appearing mitral valve.  MRI brain without contrast on 02/06/2021 showed no acute intracranial abnormality.  MRIs cervical, thoracic, and lumbosacral spine without contrast on 02/10/2021 showed no evidence of infection.  Repeat transthoracic echocardiogram on 02/11/2021 showed tricuspid vegetation with severe tricuspid regurgitation.  The patient underwent extraction of remaining teeth on 02/09/2021.  She underwent bioprosthetic tricuspid valve replacement with primary closure of patient foramen ovale on 02/13/2021 when operative culture grew MRSA.  Surgical pathology showed valve tissue with fibrosis and adherent vegetations containing numerous colonies of bacterial organisms (predominantly gram positive cocci with nonviable forms) consistent with endocarditis.  She had her last dialysis session on 02/14/2021 with removal of her most recent left femoral temporary dialysis catheter at that time.  She developed a rash around 02/15/2021 that raised concern for vancomycin-induced rash.  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  Chest radiographs 02/17/2021-02/23/2021 showed bilateral pleural effusions with partially loculated right pleural effusion reported as of 02/22/2021.  The patient underwent right chest tube insertion on 02/23/2021 with immediate drainage of approximately 1 liter of bloody fluid consistent with hemothorax and subsequent removal on 02/25/2021.  Right pleural fluid culture on 02/23/2021 had no growth with no organisms on Gram stain.  The patient received vancomycin 02/06/2021-02/14/2021 prior to being  transitioned to daptomycin (02/17/2021-present) and rifampin (02/16/2021-present).  Plan is for a total 6-week antimicrobial course dated from negative blood cultures (end date 03/29/2021).  As clearance of bacteremia was not achieved preoperatively and operative cultures were positive, will follow the initial 6-weeks of treatment with doxycycline oral suppression for 6 months.  Pre-enrollment testing for the OPTIMAL Trial performed on 02/26/2021 included blood cultures X 2 sets that are engative; CT chest, abdomen, and pelvis without contrast that showed postoperative changes from tricuspid valve repair with loculated fluid along right heart border (similar appearance to right hemothorax prior to chest tube drainage; felt to be postoperative fluid/small amount of bloody fluid), redemonstration of numerous septic emboli in bilateral lungs with improving groundglass and intralobular septal thickening, mild interval worsening of small right pleural effusion and right basilar consolidation with trace hydropneumothorax on right, and marked hepatosplenomegaly; and transthoracic echocardiogram that showed left ventricular ejection fraction 61.2% and tricuspid bioprosthesis with normal leaflet motion and no paravalvular tricuspid regurgitation.  Our Psychiatry/Substance Use Treatment evaluated the patient and assisted with transition to Suboxone on 02/27/2021.  The patient consented for enrollment in the [place] and has been randomized to the oral antimicrobial arm (beginning 03/02/2021).  Her antimicrobial regimen will change to linezolid 600 mg PO BID and rifampin 300 mg PO BID for the last 4 weeks (03/02/2021-03/29/2021) of a total 6-week antimicrobial treatment course prior to transitioning to doxycycline oral suppression (as of 03/30/2021) for 6 months.    Patient requires HIGH RISK DECISION MAKING due to the risk of complications, significant morbidity and/or mortality associated with the necessary therapy,  specifically drug therapy, that requires intensive monitoring for toxicity resulting from the narrow therapeutic index of medications the patient is currently being administered, including but not limited to: antimicrobials such as Vancomycin, Daptomycin, and Gentamicin; new Warfarin/coumadin for anticoagulation for new valve replacement; opioids  for pain management in a patient with a history of substance abuse;  and/or medical alternative therapy such as Suboxone for history of substance abuse.     Additionally, this patient may require HIGH RISK DECISION MAKING for a chronic illness which poses a threat to life or bodily function, specifically psychiatric illness of addiction/substance misuse (if listed below) with a potential threat to self, as this condition directly lead to the patient's life-threatening infection and current need for hospitalization.     Does the patient have a history of an underlying substance use disorder? 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 Renita Papa  - Microbiology:   [facility]:   - 02/01/2021 Blood cultures X 2 pediatric sets: MRSA (vancomycin MIC 2, rifampin MIC <1, tetracycline MIC <4) in 2 of 2 bottles   - 02/02/2021 Blood cultures X 2 sets: MRSA in 2 of 4 bottles (both sets)   - 02/02/2021 Sputum culture: MRSA as well as yeast and usual flora   - 02/04/2021 Blood cultures X 2 sets: MRSA in 2 of 4 bottles (both sets)   [facility]:   - 02/05/2021 BAL culture: 15,000 MRSA   - 02/05/2021 Blood cultures X 2 sets: MRSA (vancomycin MIC 1, daptomycin MIC 0.5,  ceftaroline susceptible, rifampin MIC <=0.5, linezolid MIC 2, tetracycline MIC <=1, doxycycline MIC <=0.5) in 2 of 2 sets   - 02/07/2021 Blood cultures X 2 sets: MRSA in 2 of 2 sets   - 02/09/2021 Blood cultures X 2 sets: MRSA in 2 of 2 sets   - 02/11/2021 Blood cultures X 2 sets: MRSA in 2 of 2 sets   - 02/13/2021 Blood cultures X 2 sets: MRSA in 2 of 2 sets   - 02/13/2021 Operative culture from tricuspid valve tissue: MRSA   - 02/13/2021 BAL culture: MRSA   - 02/14/2021 Blood cultures X 2 sets: MRSA in 2 of 2 sets   - 02/15/2021 Blood cultures X 2 sets: MRSA in 2 of 2 sets   - 02/16/2021 Blood cultures X 2 sets: No growth (clearance of bacteremia achieved)   - 02/23/2021 Right pleural fluid culture: No growth   - 02/25/2021 Blood cultures X 2 sets: Pending  - Pathology: 02/13/2021 Surgical pathology: Valve tissue with fibrosis and adherent vegetations containing numerous colonies of bacterial organisms (predominantly gram positive cocci with nonviable forms) consistent with endocarditis  - Antimicrobial Therapy: Daptomycin 8 mg/kg IV Q24 hours + rifampin 300 mg PO BID  - Duration of antimicrobial therapy and end date: 6 weeks (2 weeks IV + 4 weeks PO) dated from negative blood cultures (02/16/2021 = day 1); end date 03/29/2021   - Transition to linezolid 600 mg PO BID and rifampin 300 mg PO BID for the last 4 weeks (03/02/2021-03/29/2021) of a total 6-week antimicrobial treatment course; patient's Medicaid termed - linezolid and rifampin able to be provided at [facility] $24.46 total, which patient is able to afford (and patient will contact [facility] to restart her Medicaid); counseling regarding both antimicrobials was provided to the patient   - Following completion of the above regimen, transitioning to doxycycline oral suppression (as of 03/30/2021) for 6 months -  will prescribe at time of follow-up in Infectious Diseases Clinic unless also able to get at [facility] on discharge  - Location of central access  and date placed: PICC placed in right brachial vein on 02/21/2021 - remove prior to discharge  - Labs: Minimum of twice weekly CBC with differential, BMP, magnesium, hepatic function panel, and CRP as well as CK weekly while inpatient; weekly CBC with differential, BUN, creatinine, total bilirubin, AST, ALT, alkaline phosphatase, and CRP after discharge (external orders placed)  - Additional medications: Aspirin 81 mg PO daily, bumetanide 1 mg IV daily, metoprolol tartrate 25 mg PO BID    Anticoagulation:  - Indication: Tricuspid valve replacement  - Medication/Therapy: Warfarin  - Current dose: 1 mg (resumed 02/26/2021)  - Anticoagulation needed at discharge: Yes - 3 months postoperatively unless otherwise specified by Dr. Renita Papa  - Goal INR: 2  - Last INR: 1.45 on 02/27/2021  - Outpatient INR Monitoring: Cardiac Surgery team will discuss INR monitoring with Dr. Renita Papa on 02/28/2021 assuming anticoagulation is continued; will need close monitoring of INR once rifampin is completed as warfarin dose will likely need decreased at that time    Pain Control:   - Current Regimen:   - Acetaminophen 650 mg PO Q4 hours prn   - Lidocaine patch 3 TD daily  - Bowel regimen: Senokot-S 2 tabs PO BID, Miralax 34 g daily    History of Substance Misuse:  - Diagnosis: Opioid use disorder, Stimulant use disorder, Alcohol use disorder in remission, Sedative-hypnotic use disorder in remission  - Substance(s) Used: Heroin, methamphetamine, prior alcohol, prior gabapentin  - Medical/Withdrawal Therapy: N/A  - Evaluation by Psychiatry: Yes  - Agreeable to individual therapy: TBD  - Agreeable to group therapy: TBD  - Agreeable to outpatient therapy: Yes  - Agreeable to medical alternative therapy: Yes - transitioned to Suboxone on 02/27/2021  - Agreeable to DDU: N/A  - Agreeable to [facility]: TBD  - Agreeable to [place]: TBD  - Risk to discharge with PICC: High    Screening:  - UDS (on admission): Not done  - UDS (most  recent): 02/27/2021 Positive for buprenorphine and oxycodone (had just been given both while inpatient)  - Syphilis Screening: Negative  - Urine Gonorrhea/Chlamydia: Previously ordered  - Tdap: Discuss before discharge  - SARS-CoV-2 (COVID-19) Vaccination: Discuss before discharge  - SARS-CoV-2 (COVID-19) Screening: 02/27/2021 negative    Cleared hepatitis C   - Antibody Positive Date: 02/05/2021  - Quantitative RNA PCR Value: 02/05/2021 Target not detected  - HIV Screen: Negative  - Hep A IgG: Reactive (immune)  - Hep B Surface Ag: Negative  - Hep B Surface Ab: 497  - Hep B Core Ab: Negative    Right hemothorax - improved  - Status post right chest tube insertion on 02/23/2021 and removal on 02/25/2021    Idiopathic postoperative anemia - still present  - Most recently transfused PRBCs on 02/21/2021, 02/24/2021, and 02/27/2021  - Follow hemoglobin and hematocrit; transfuse additional PRBCs for hemoglobin <7  - Cardiac Surgery will discuss the patient's warfarin with Dr. Renita Papa tomorrow and provide recommendations    Acute kidney injury on possible CKD 3 - improving  - Nephrology following  - Decrease bumetanide to 1 mg IV Q24 hours; will need to finalize recommendations regarding an oral diuretic regimen prior to discharge  - Continue sodium bicarbonate 650 mg PO TID  - Continue Nephrocaps 1 capsule PO daily  - Monitor BMP, magnesium,  and phosphorus and I/Os    Leukocytoclastic vasculitis - improved  - Confirmed by punch biopsy pathology on 02/16/2021  - Continue triamcinolone 0.1% topical ointment BID for now    Insomnia - improved  - Continue melatonin 5 mg PO nightly    Chronic Issues:   Unspecified anxiety and depressive disorders - improved  - Continue quetiapine 100 mg PO nightly    Tobacco use disorder - stable  - Counsel regarding smoking cessation this admission  - Continue nicotine replacement therapy    Resolved Issues:   Dialysis requirement    DVT/PE Prophylaxis/Anticoagulation: Warfarin    Disposition: Home with  OPTIMAL Trial Oral Antimicrobial Arm/COpAT on 03/02/2021    Follow Up Appointments:  PCP/TCC: Set up a PCP in [place], [place], on discharge; follow-up in TCC 1 week after discharge if unable to establish with PCP that soon  Infectious Diseases: Telephone follow-up with Dr. Alfonzo Feller in [facility] on 03/15/2021 at 11:00 AM (being scheduled) as well as in-person follow-up 1 month after discharge on same day as appointment with Cardiac Surgery (will schedule before discharge)  Cardiac Surgery: Follow up with Dr. Renita Papa 1 month after discharge (will schedule before discharge)  Psychiatry: Follow up in [facility] (preferred if feasible) or [place] with release of information to obtain record of appointment attendance while completing treatment phase with oral antimicrobial therapy    Evangeline Dakin, M.D., 02/27/2021  Assistant Professor, Sections of Infectious Diseases and [place] Department of Medicine

## 2021-02-27 NOTE — Ancillary Notes (Addendum)
Patient has transferred service from Cardiac Surgery to [facility] infusion services.  Patient has been educated by CR post-procedure and currently has no questions. Pt has been ambulating 300 plus feet with CR and states she has been permitted to ambulate on her own on [address]. CR services will sign off on this patient at this time.  Ambulation is encouraged with Nursing/CA staff or independently 4-6x/day to decrease risk of deconditioning during hospital stay.  For concerns with gait, balance, and/or endurance, please consult Physical Therapy.    Thank you for this referral.  Julianne Rice [facility]  Cardiac Rehab   [phone]

## 2021-02-27 NOTE — Research Notes (Signed)
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    Study Team Composition:  Department of Cardiovascular and Thoracic Surgery  Principal Investigator - Fredderick Phenix, MD   Co-Investigator(s) - J. Michaelle Birks, MD; Adela Ports, MD; Nell Range, MD, Hassie Bruce, MD,  Bea Jemmie, [place]  +kc Protocol #: [id number]  Sponsor or Funding Source: NIH CTSN CRISP  Approved:15-Dec-2021Expires:12-Oct-[phone]    Date of consent: 27-Feb-2021  Subject ID #: [id number]  Randomization: Oral Abx on 13Sep2022    Demographics:  [name] [name]   MRN:  [id number]    DOB: 03-14-1986  Today's date:  February 27, 2021  Non-Hispanic    Endocarditis: Yes. If yes, Type Treated, Endocarditis Culture MRSA    Informed Consent:   Subjective   Study Design and Methodology:  This is a prospective pilot post-operative institutional study to ascertain safety and compliance of initial IV antibiotic followed by oral therapy following uncomplicated IVDA endocarditis. Data will be collected to ascertain safety and compliance of initial IV antibiotic followed by oral therapy following uncomplicated IVDA endocarditis.   There may be no direct benefit to study participants, but this research will make a significant contribution to the scientific field and general population. Future studies that can further inform the care of these subjects can be built upon the results from this study.     Study Purpose:  The purpose of this study is to determine the safety and compliance of initial intravenous (IV) antibiotics followed by oral antibiotic therapy following uncomplicated IVDA endocarditis. Endocarditis has a high rate of sickness and death, involves a long hospitalization and a long-term use of IV antibiotics necessitating six (6) weeks of in-patient hospital stay, and comes  with a high cost. A total of approximately twenty (20) subjects, are expected to participate in this study.      Procedures:  This study involves being treated for your condition through the normal standard of care or a different schedule of care and your participation is expected to last approximately two (2) months.   You have endocarditis and are undergoing surgical therapy. By taking part in this study, you will be randomly assigned (like flipping a coin) to either receive the standard of care treatment group, which is six (6) weeks of IV antibiotics while staying in the hospital or the experimental group which is two (2) weeks of IV antibiotics in the hospital and four (4) weeks of oral antibiotics not in the hospital. Both groups of subjects will have access to addiction services as in-patient for two (2) weeks followed by weekly counseling sessions as in-patient or out-patient in controlled manner. You will have weekly monitoring of your blood antibiotic levels and will also have use of an electronic compliance monitoring application called MEDISAFE.    Study Population: N = 20 Subjects  Subjects will be identified by the study investigators and International aid/development worker. Once inclusion and exclusion criteria have been met, the patient will be approached by the primary investigator  and/or study investigator to present the study. If the patient indicates interest in the study, the study investigator will collaborate with the research coordinator to obtain informed consent.      Inclusion (Must be "Yes"):  The age of the patient is ? 18. Yes  The patient has undergone an urgent or emergent primary cardiac valvar operation as treatment for IVDA endocarditis, with blood cultures positive for  Streptococcus, Enterococcus faecalis, Staphylococcus aureus, or coagulase-negative Staphylococci. Yes  The patient has received 2 weeks of postoperative inpatient IV antibiotic therapy with negative blood cultures and no  residual active infection by imaging (i.e. computerized axial tomography, echocardiography). Yes  The patient has the capacity to participate in a compliance tracking tool for medication administration (e.g. a centrally managed core site mobile Medisafe compliance [email or web address]) as confirmed by both a physician and a care management team member. Yes    Exclusion (Must be "No"):  Inability to give informed consent. No  Residual infection requiring IV antibiotic therapy. No  Any persistent secondary noncardiac infection (e.g. infections of solid organs or joints). No  Known poor compliance or deemed incapable to comply with the compliance tracking tool. No  Reduced absorption or inability to receive oral treatment due to a gastrointestinal disorder. No  Any infection involving a more virulent organisms, such as fungal infections or infections with Serratia or HACEK infections (Haemophilus, Aggregatibacter, Cardiobacterium, Eikenella, Kingella). No  Cancer not otherwise in remission or in need of current or future oncologic therapy. No  Medically immunocompromised state. No  Reoperative valvar operation for IVDA endocarditis. No  10. History of habitual noncompliance. No  11. Pregnancy. No  12. Mental incapacity.  13. Unable to perform local or institutional medical and psychiatric follow up. No  14. Unstable home environment. No  15. Inadequate access to mobile cell service (geographic/rurality). No    Study Design - Prospective randomized institutional pilot trial of 20 subjects.   Both groups will receive the following support:   1. All patients will enroll in aggressive drug rehabilitation with mandatory participation in a formal psychiatric rehabilitation program for a minimum of 6 weeks (combined inpatient and outpatient),  2. All patients will be followed closely  by Infectious Disease specialists  3. All patients will undergo monitoring of treatment efficacy with serum antibiotic levels during the  treatment phase  4. All patients will undergo surveillance monitoring of treatment efficacy with blood cultures, and  5. All patients will participate in a compliance tracking tool for medication administration (e.g. a centrally managed core site mobile Medisafe compliance program [https://www.medisafe.com/]. All patients and their family/primary care givers receive Medisafe compliance training).    Postoperative patients who have undergone valvar repair or replacement for IVDA endocarditis may be prospectively randomized into two arms:   1. Group I (Experimental Group): 2 weeks of postoperative inpatient IV antibiotic therapy followed by 4 weeks of oral therapy with outpatient follow-up.  2. Group II (Control Group): conventional 2 weeks of postoperative inpatient IV antibiotic therapy followed by 4 weeks of IV antibiotic therapy (inpatient or facility supervised if indwelling catheter utilized).    Study Duration - Subjects will be followed for twelve (12) months. Enrollment is expected to take one (1) year to achieve our proposed sample size. Data collection will occur in  real-time throughout the study and data analysis will be performed at the conclusion of the study.      Participation in this research study is completely voluntary and you are free to withdraw from the research at any time. If you do not wish to participate, please discuss alternatives with the study doctor or refer to the "Alternatives" section in the consent form. You may or may not directly benefit from participating in this research.   Risks from participation in this study include recurring infection, lack of infection resolution, and/or adverse drug reaction. In addition, there is always the risk of uncommon or previously unknown  side effect(s) or event(s).       The patient was provided with an Informed Consent Form with contact information and was given time to read and ask questions for each section. I covered each section of the  consent form and explained that their consent is voluntary and can be withdrawn at any point of the study. The patient was explained who to contact to withdraw their consent if necessary. They were provided extra time to review each section and to ask questions.     I worked directly with the patient to record their medical and surgical history, demographics, living area for mobile service, and to help setup the [facility] app and compliance reporting methods.  The patient's consent gives [place] access to weekly lab results, medication information, adverse event reporting such as hospitalization and reinfection, and Covid infections.         Narrative:  I met with the patient who stated she had been given information of the trial by Dr. Alfonzo Feller during an earlier encounter. I performed the informed consent process outlining the trial, duration, follow-ups, treatment arms, treatment length. The patient stated that she understood the trial and would comply to treatment using the [place] reporting application on her smartphone. The patient stated she plans to move after discharge in a few months pending the type of neighborhood and if it is a safe environment to live. The patient signed a Medical Records Release Form in case she moves and has treatment done at another facility for [place] to follow her the full 12 months of the trial. The patient seemed an appropriate candidate for the trial and agreed to complete follow-ups, treatment, and compliance tracking.     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    ALLERGIES:    She has No Known Allergies.     FAMILY HISTORY:    Her family history is not on file.    SOCIAL HISTORY:    She  reports previous alcohol use. She  reports that she quit smoking about 4 weeks ago. Her smoking use included cigarettes. She started smoking about 23 years ago. She has a 15.00 pack-year smoking history. She has never used smokeless tobacco. She  reports previous drug use. Frequency: 5.00 times per week. Drugs: Cocaine, Amphetamine, Heroin, Marijuana, Methamphetamines, Psilocybin, and Opioid.       Medications:     No outpatient medications have been marked as taking for the 02/27/21 encounter (Documentation Only) with Bea Janece, [place].       Physical Exam:     Vitals: Most Recent Vitals    Flowsheet Row Admission (Current) from 02/05/2021 in [facility]   Temperature 36.8 C (98.2 F) filed at... 02/27/2021 1119   Heart Rate 71 filed at... 02/27/2021 1119   Respiratory Rate 17 filed at... 02/27/2021 1119   BP (Non-Invasive) 144/85 filed at... 02/27/2021 1119   SpO2 97 % filed at... 02/27/2021 1119   Height 1.651 m (5' 5") filed at.Marland KitchenMarland Kitchen  02/13/2021 1200   Weight 67.6 kg (149 lb 0.5 oz) filed at... 02/26/2021 0654   BMI (Calculated) 24.85 filed at... 02/26/2021 0654   BSA (Calculated) 1.78 filed at... 02/13/2021 1200         Provider Exam Copied Here  Physical Exam:  Vitals:  Temperature: 36.7 C (98.1 F) Heart Rate: 92 BP (Non-Invasive): 128/79   Respiratory Rate: 16 SpO2: 94 %    General: Well-appearing without acute distress.  She does not look toxic.  Eyes: Pupils equal and round bilaterally.  No conjunctival injection or scleral icterus.  ENT: Face symmetrical.  Mucous membranes pink and moist.  Edentulous.  Neck: Supple and symmetrical.  Lungs: Decreased breath sounds in right lung base and otherwise clear to auscultation bilaterally.  No wheezes or crackles.  Unlabored respirations.  Cardiovascular: Regular rate and rhythm.  Abdomen: Normoactive bowel  sounds.  Nondistended, soft, and nontender to palpation.  Extremities: No joint erythema or swelling.  Trace/less than 1+ edema of bilateral lower extremities.  Skin: Warm and dry.  Vasculitic rash faint and resolving on bilateral legs.  Neurologic: Alert and oriented X 3.  Moves all extremities without focal deficit.  Psychiatric: Pleasant.  Appropriate affect and behavior.  Intact memory.            Laboratory Results:     CBC Results Coag Results   Recent Labs     02/24/21  1121 02/25/21  0524 02/25/21  0651 02/26/21  0509 02/27/21  0544   WBC 12.0* 7.9  --  6.5 8.1   HGB 8.1*  8.1* 6.8* 7.3* 9.0* 6.9*   HCT 24.4*  24.4* 20.7* 22.2* 27.3* 21.3*   PLTCNT 445* 306  --  262 265      Recent Labs     02/17/21  2257 02/18/21  0201 02/24/21  0206 02/25/21  0524 02/27/21  0545   PROTHROMTME  --    < >  --    < > 16.8*   INR  --    < >  --    < > 1.45*   APTT 93.8*  --   --   --   --    ABORHD  --    < > O POSITIVE  --   --    ABSCR  --    < > NEGATIVE  --   --     < > = values in this interval not displayed.          BMP Results ABG Results   Recent Labs     02/27/21  0544   SODIUM 138   POTASSIUM 3.9   CHLORIDE 109   CALCIUM 8.5   TOTALPROTEIN 7.1   MAGNESIUM 2.0   PHOSPHORUS 6.5*   ALBUMIN 2.8*   CO2 17*   BUN 58*   CREATININE 1.85*    Recent Labs     02/08/21  1531 02/11/21  0018 02/13/21  0902 02/13/21  0914 02/13/21  0931 02/13/21  1002 02/13/21  1051 02/13/21  1207 02/13/21  1359 02/14/21  0500   FI02 21.0   < > 100 80.0 80 70 100 80 35  --    PH 7.46*   < > 7.35 7.23* 7.27* 7.28* 7.43 7.39 7.41 7.46*   PCO2 39*   < > 39 54* 51* 53* 39 42 40  --    PO2 33*   < > 408* 53* 354* 134* 121* 344* 114*  --  BICARBONATE 27.0*   < > 22.0 20.9* 22.2 23.4 26.1* 25.2 25.5  --    BASEEXCESS 3.6*  --   --   --   --   --  1.5* 0.3 0.7  --    BASEDEFICIT  --    < > 3.8* 4.8* 3.5* 2.0  --   --   --   --    HEMOGLOBIN 7.9*   < > 8.1* 7.8* 8.3* 8.8* 8.5* 9.4*  --   --     < > = values in this interval not displayed.        Liver  Test Results Cardiac Results   Recent Labs     02/22/21  2131 02/27/21  0544   TOTBILIRUBIN 0.5 0.7   BILIRUBINCON 0.3 0.4   AST 22 19   ALT 5* 10   ALKPHOS 77 112*    Recent Labs     02/17/21  0319 02/24/21  0037   CPK 51 13*        Pregnancy Test Results              Imaging:     Chest X-Ray Results     Recent Results (from the past 720 hour(s))   XR AP MOBILE CHEST    Collection Time: 02/26/21  6:09 AM    Narrative    Wyn Forster  Female, 35 years old.    XR AP MOBILE CHEST performed on 02/26/2021 6:09 AM.    REASON FOR EXAM:  s/p tvr    TECHNIQUE: 1 view/1 image(s) submitted for interpretation.    FINDINGS: Compared to 02/25/2021. There is a shallow inspiration. The heart is stable in size. Bilateral patchy opacities and nodules are noted. No visible pneumothorax. Metallic foreign body projects within the soft tissues of the base of the right neck, likely broken needle fragment.    Right pigtail chest tube has been removed. Right PICC is stable.        Impression    Stable lung aeration.           EKG Results     Recent Results (from the past 720 hour(s))   ECG 12-LEAD    Collection Time: 02/05/21  1:00 AM   Result Value    Ventricular rate 99    Atrial Rate 99    PR Interval 136    QRS Duration 86    QT Interval 338    QTC Calculation 433    Calculated P Axis 41    Calculated R Axis 13    Calculated T Axis 21    Narrative    Normal sinus rhythm  Normal ECG  No previous ECGs available  Preliminary by fellow Harlow Asa, MD, AHMED [phone]) on 02/05/2021 1:22:59 PM  Confirmed by Pasty Spillers MD, ANTHONY ([id number]) on 02/05/2021 7:19:09 PM          ECHO Results   .[id number]   CT Results     Recent Results (from the past [id number] hour(s))   CT CHEST ABDOMEN PELVIS WO IV CONTRAST    Collection Time: 02/26/21  9:22 AM    Narrative    Wyn Forster  Female, 35 years old.    CT CHEST ABDOMEN PELVIS WO IV CONTRAST performed on 02/26/2021 9:22 AM.    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 The largest in the right lower lobe appears improved in size versus fluid within the cavitary component and lower lung volumes seen in the right series 3, image 205. The groundglass opacities and interlobular septal thickening have improved from prior exam. More consolidative opacities and right pleural effusion however have mildly increased in size. There appears to be a trace hydropneumothorax on the right series 2, image 61.    HEART/MEDIASTINUM: Heart size is mildly prominent with questionable loculated right pleural effusion versus pericardial effusion as seen on series 2, image 64.Marland Kitchen Postsurgical changes from tricuspid valve replacement is noted. Mediastinal structures are otherwise not well assessed due to inflammation and consolidation within the anterior mediastinum likely postsurgical changes. The aorta and pulmonary arteries appear within normal limits of size. Thyroid appears within normal limits. Right subclavian catheter with the tip in the low SVC right atrial junction is noted.    LYMPH NODES: No enlarged axillary lymph nodes with numerous subcentimeter lymph nodes noted.    BONES/SOFT TISSUES: Postsurgical changes from median sternotomy are noted. No acute vertebral height loss within the thoracic spine. Diffuse anasarca is noted.    ABDOMEN/PELVIS:   LIVER: Liver is enlarged measuring 25.3 cm in length along the right hepatic lobe.    BILIARY SYSTEM/GALLBLADDER: No biliary dilatation. Gallbladder is mildly distended without wall thickening.    PANCREAS: Within normal limits.    KIDNEY/URETERS: No renal calculi or  hydronephrosis.    ADRENALS: No definite adrenal nodules.    SPLEEN: Spleen is enlarged measuring 16.8 cm in length.    BOWEL: No abnormally dilated small bowel loops are seen. Scattered stool throughout the colon without evidence of obstruction.    VASCULATURE/LYMPH NODES: Visualized portion of the aorta is within normal limits. Difficult assessment for adenopathy due to lack of IV contrast and nonopacification of numerous bowel loops.    BLADDER: No asymmetric bladder wall thickening.    REPRODUCTIVE ORGANS: Uterus is not well-visualized.    PERITONEAL CAVITY: Small amount of ascites in the pelvis.    BONES/SOFT TISSUES: No acute vertebral height loss within the lumbar spine. Mild anasarca is noted.      Impression    1.Postsurgical changes from tricuspid valve repair with loculated fluid along the right heart border which may represent a loculated pericardial effusion versus loculated pleural effusion.  2.Redemonstration of numerous septic emboli in the bilateral lungs with improving groundglass and intralobular septal thickening. There is however mild interval worsening of small right pleural effusion and right basilar consolidation with trace hydropneumothorax on the right.  3.Marked hepatosplenomegaly.  4.Persistent small amount of ascites in the pelvis.             Consent Overview:         the patient was deemed to meet screening requirements in order to determine study eligibility         the patient was given the opportunity to ask questions and the questions were answered         documentation of any family members/witnesses/translators who participated in the consent process         the patient understands the study follow-up requirements and study procedures         a copy of the signed consent form was  provided to subject         all screening/study procedures occurred after the ICF was signed         patient understands participation in the study is voluntary      Plan:  The Patient's Consent  Form will be attached to their chart and this encounter as well as filed into their subject binder and entered into [employer].  Ensure that the patient's MEDISAFE app has the correct medications, dosage, time of dosing, and Compliance Report Recipients in the app.  Follow-up Visit will be scheduled with Cardiac-Surg Clinic and Infection Clinic with Drs. Fannye Myer and Juskowich.       Bea Cynthie, [facility]  Office: [phone]  [email or web address] .Radonna Ricker

## 2021-02-27 NOTE — Nurses Notes (Signed)
Blood transfusion initiated. VSS. RN remained with patient for first 15 min of infusion. No reaction noted.

## 2021-02-27 NOTE — Nurses Notes (Signed)
Results for [name] [name], [name] [name] (MRN [id number]) as of 02/27/2021 06:08   Ref. Range 02/27/2021 05:44   HGB Latest Ref Range: 11.5 - 16.0 g/dL 6.9 (LL)   Critical result reported to service. Awaiting results. [name], RN  02/27/2021, 06:11

## 2021-02-27 NOTE — Care Plan (Signed)
[place]  Physical Therapy Progress Note      Patient Name: [name] [name]  Date of Birth: [birth year]  Height:  165.1 cm ('5\' 5"'$ )  Weight:  67.6 kg (149 lb 0.5 oz)  Room/Bed: 664/A  Payor: [place] MEDICAID / Plan: [employer] [place] MEDICAID / Product Type: Medicaid [facility] /     Assessment:     Pt transferring and ambulating independently. No further PT needs. Will sign off.    Discharge Needs:   Equipment Recommendation: none anticipated    Discharge Disposition: home    JUSTIFICATION OF DISCHARGE RECOMMENDATION   Based on current diagnosis, functional performance prior to admission, and current functional performance, this patient requires continued PT services in home in order to achieve significant functional improvements in these deficit areas: aerobic capacity/endurance, gait, locomotion, and balance, muscle performance.      Plan:   Continue to follow patient according to established plan of care.  The risks/benefits of therapy have been discussed with the patient/caregiver and he/she is in agreement with the established plan of care.     Subjective & Objective:        02/27/21 1016   Therapist Pager   PT Assigned/ Pager # Selma Mink [phone]   Rehab Session   Document Type therapy progress note (daily note)   Total PT Minutes: 8   Patient Effort good   Symptoms Noted During/After Treatment none   General Information   Patient Profile Reviewed yes   Patient/Family/Caregiver Comments/Observations Pt states she has been getting around well and has no concerns about her mobility.   Medical Lines PICC Line   Respiratory Status room air   Mutuality/Individual Preferences   Individualized Care Needs up ad lib   Pre Treatment Status   Pre Treatment Patient Status Patient sitting in bedside chair or w/c   Support Present Pre Treatment  Nurse present   Communication Pre Treatment  Nurse   Cognitive Assessment/Interventions   Behavior/Mood Observations alert;cooperative   Attention WNL/WFL   Follows  Commands WFL   Vital Signs   O2 Delivery Pre Treatment room air   O2 Delivery Post Treatment room air   Pain Assessment   Pre/Posttreatment Pain Comment denied pain   Transfer Assessment/Treatment   Sit-Stand Independence independent   Stand-Sit Independence independent   Gait Assessment/Treatment   Independence  independent   Distance in Feet 50   Balance Skill Training   Sitting Balance: Static good balance   Sitting, Dynamic (Balance) good balance   Sit-to-Stand Balance good balance   Standing Balance: Static good balance   Standing Balance: Dynamic good balance   Plan of Care Review   Plan Of Care Reviewed With patient   Physical Therapy Clinical Impression   Assessment Pt transferring and ambulating independently. No further PT needs. Will sign off.   Criteria for Skilled Therapeutic no   Anticipated Equipment Needs at Discharge (PT) none anticipated   Anticipated Discharge Disposition home       Therapist:   Moshe Salisbury, PT   Pager #: [phone]

## 2021-02-27 NOTE — Behavioral Health (Signed)
Patient remained in her room and is asleep during Rounds.

## 2021-02-28 DIAGNOSIS — F32A Depression, unspecified: Secondary | ICD-10-CM

## 2021-02-28 DIAGNOSIS — F419 Anxiety disorder, unspecified: Secondary | ICD-10-CM

## 2021-02-28 DIAGNOSIS — G4700 Insomnia, unspecified: Secondary | ICD-10-CM

## 2021-02-28 DIAGNOSIS — F1011 Alcohol abuse, in remission: Secondary | ICD-10-CM

## 2021-02-28 DIAGNOSIS — B1920 Unspecified viral hepatitis C without hepatic coma: Secondary | ICD-10-CM

## 2021-02-28 DIAGNOSIS — F1311 Sedative, hypnotic or anxiolytic abuse, in remission: Secondary | ICD-10-CM

## 2021-02-28 DIAGNOSIS — M310 Hypersensitivity angiitis: Secondary | ICD-10-CM

## 2021-02-28 DIAGNOSIS — F1510 Other stimulant abuse, uncomplicated: Secondary | ICD-10-CM

## 2021-02-28 LAB — STERILE SITE CULTURE AND GRAM STAIN, AEROBIC
FLC: NO GROWTH
GRAM STAIN: NONE SEEN

## 2021-02-28 LAB — CBC WITH DIFF
BASOPHIL #: 0.1 10*3/uL (ref <=0.20)
BASOPHIL %: 2 %
EOSINOPHIL #: <0.1 10*3/uL (ref <=0.50)
EOSINOPHIL %: 0 %
HCT: 23.2 % — ABNORMAL LOW (ref 34.8–46.0)
HGB: 7.3 g/dL — ABNORMAL LOW (ref 11.5–16.0)
IMMATURE GRANULOCYTE #: 0.12 10*3/uL — ABNORMAL HIGH (ref <0.10)
IMMATURE GRANULOCYTE %: 2 % — ABNORMAL HIGH (ref 0–1)
LYMPHOCYTE #: 1.61 10*3/uL (ref 1.00–4.80)
LYMPHOCYTE %: 25 %
MCH: 26.8 pg (ref 26.0–32.0)
MCHC: 31.5 g/dL (ref 31.0–35.5)
MCV: 85.3 fL (ref 78.0–100.0)
MONOCYTE #: 0.55 10*3/uL (ref 0.20–1.10)
MONOCYTE %: 9 %
MPV: 8.1 fL — ABNORMAL LOW (ref 8.7–12.5)
NEUTROPHIL #: 3.99 10*3/uL (ref 1.50–7.70)
NEUTROPHIL %: 62 %
PLATELETS: 263 10*3/uL (ref 150–400)
RBC: 2.72 10*6/uL — ABNORMAL LOW (ref 3.85–5.22)
RDW-CV: 18 % — ABNORMAL HIGH (ref 11.5–15.5)
WBC: 6.4 10*3/uL (ref 3.7–11.0)

## 2021-02-28 LAB — CROSSMATCH RED CELLS - UNITS
UNIT DIVISION: 0
UNIT DIVISION: 0

## 2021-02-28 LAB — BASIC METABOLIC PANEL
ANION GAP: 10 mmol/L (ref 4–13)
BUN/CREA RATIO: 34 — ABNORMAL HIGH (ref 6–22)
BUN: 57 mg/dL — ABNORMAL HIGH (ref 8–25)
CALCIUM: 8.3 mg/dL — ABNORMAL LOW (ref 8.5–10.0)
CHLORIDE: 110 mmol/L (ref 96–111)
CO2 TOTAL: 19 mmol/L — ABNORMAL LOW (ref 22–30)
CREATININE: 1.7 mg/dL — ABNORMAL HIGH (ref 0.60–1.05)
ESTIMATED GFR: 40 mL/min/BSA — ABNORMAL LOW (ref >=60)
GLUCOSE: 88 mg/dL (ref 65–125)
POTASSIUM: 3.8 mmol/L (ref 3.5–5.1)
SODIUM: 139 mmol/L (ref 136–145)

## 2021-02-28 LAB — TYPE AND SCREEN
ABO/RH(D): O POS
ANTIBODY SCREEN: NEGATIVE

## 2021-02-28 LAB — PHOSPHORUS: PHOSPHORUS: 7.3 mg/dL — ABNORMAL HIGH (ref 2.4–4.7)

## 2021-02-28 LAB — HCG, SERUM QUALITATIVE, PREGNANCY: PREGNANCY, SERUM QUALITATIVE: NEGATIVE

## 2021-02-28 LAB — MAGNESIUM: MAGNESIUM: 2 mg/dL (ref 1.8–2.6)

## 2021-02-28 LAB — PT/INR
INR: 1.47 — ABNORMAL HIGH (ref 0.80–1.20)
PROTHROMBIN TIME: 17 seconds — ABNORMAL HIGH (ref 9.1–13.9)

## 2021-02-28 MED ORDER — WARFARIN 2 MG TABLET
2.0000 mg | ORAL_TABLET | Freq: Once | ORAL | Status: AC
Start: 2021-02-28 — End: 2021-02-28
  Administered 2021-02-28 (×2): 2 mg via ORAL
  Filled 2021-02-28: qty 1

## 2021-02-28 MED ORDER — QUETIAPINE 50 MG TABLET
50.0000 mg | ORAL_TABLET | Freq: Every evening | ORAL | Status: DC
Start: 2021-02-28 — End: 2021-03-02
  Administered 2021-02-28 – 2021-03-01 (×2): 50 mg via ORAL
  Filled 2021-02-28 (×2): qty 1

## 2021-02-28 MED ORDER — HYDROXYZINE PAMOATE 50 MG CAPSULE
50.0000 mg | ORAL_CAPSULE | Freq: Four times a day (QID) | ORAL | Status: DC | PRN
Start: 2021-02-28 — End: 2021-03-02

## 2021-02-28 NOTE — Ancillary Notes (Signed)
patient remained in room throughout shift. patient was cooperative.            Evert Kohl, ADDICTION SPECIALIST   02/28/2021, 18:08
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
## 2021-02-28 NOTE — Ancillary Notes (Signed)
Patient rested during the night. Was polite and cooperative.        [name], ADDICTION SPECIALIST   02/28/2021, 04:21

## 2021-02-28 NOTE — Care Plan (Signed)
[facility]  Medical Nutrition Therapy Follow Up    SUBJECTIVE:   Patient had just finished showering at time of visit, she reports diet to be changed to regular and fluid restriction to be removed today, plans to order lunch/dinner by using room service, I informed her that I was not yet aware of change and still appears to have renal diet and FR in place, phos remains elevated; She verbalized understanding. She reports good appetite and getting enough to eat and drink despite the fluid restriction, likes the magic cups, denies bowel issues.    Wt on 9/12 149#, stable from previous week.   Phos 7.3  Takes Renvela with each meal  urine output <1059m/day   BM 9/13  1+ BLE edema noted  Renvela started 9/10    OBJECTIVE:     Current Diet Order/Nutrition Support:  MNT PROTOCOL FOR DIETITIAN  DIET RENAL (60G PRO,2G NA,2G K) Restrict fluids to: 1500 ML  DIETARY ORAL SUPPLEMENTS Oral Supplements with tray: Magic Cup-Orange; LUNCH/DINNER; 1 Each     Height Used for Calculations: 165.1 cm ('5\' 5"'$ )  Weight Used For Calculations: 65.6 kg (144 lb 10 oz)  BMI (kg/m2): 24.12     Ideal Body Weight (IBW) (kg): 57.29  % Ideal Body Weight: 114.51  Adjusted/Standard Body Weight  Adjusted BW: 61 kg      Re-assessed needs if applicable:  Energy Calorie Requirements: 1650-1950 kcal   Protein Requirements (gms/day): 57 g protein      Comments: Wt stable, PO intakes/appetite good.    Plan/Interventions :   - Continue current diet for now until phos improves   - Continue phospate binder at start of each meal  - Continue Magic Cup BID  - Monitor weights 2x weekly to trend - standing for accuracy.  - Continue daily nephro vitamins  - RD to continue to follow.    Nutrition Diagnosis: Nutrition Diagnosis: Inadequate protein-energy intake related to decreased appetite and dislike of food as evidenced by po intake 25-50% of meals (ongoing/improving)        NLadona MowMS, RD, CDCES 02/28/2021, 10:55  Clinical  Dietitian  Office [phone] Pager [phone]

## 2021-02-28 NOTE — Progress Notes (Signed)
[facility] SERV[facility] PROGRESS NOTE    Name: [name] [name] Bed: 664/A   Age: 35 y.o. 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  No acute events overnight.  No new complaints.  Minimal pain at surgical incisional site.  Continues to tolerate antimicrobial therapy without rashes or pruritus.  Denies any difficulty drawing or flushing PICC line.  Denies any cravings or feelings of withdrawal on current dose of Suboxone.  Appetite good.  No difficulty urinating or moving her bowels.    REVIEW OF SYSTEMS:  Constitutional: Denies fevers and chills  Gastrointestinal: Denies nausea and vomiting    OBJECTIVE:  I have reviewed the vital signs.   Vital Signs:  Last 24 Hours Last Check   Temp  Avg: 36.7 C (98 F)  Min: 36.4 C (97.5 F)  Max: 36.9 C (98.5 F) Temperature: 36.7 C (98.1 F) (02/28/21 0500)   Pulse  Avg: 75.8  Min: 71  Max: 83 Heart Rate: 75 (02/28/21 0500)   BP  Min: 120/70  Max: 154/89 BP (Non-Invasive): 127/71 (02/28/21 0500)   Resp  Avg: 16.4  Min: 16  Max: 18 Respiratory Rate: 16 (02/28/21 0500)    SpO2  Avg: 97.9 %  Min: 95 %  Max: 100 % SpO2: 97 % (02/28/21 0500)   MAP (Non-Invasive)  Avg: 101.2 mmHG  Min: 86 mmHG  Max: 110 mmHG     Height: 165.1 cm (_0 )   Weight: 67.6 kg (149 lb 0.5 oz) (02/26/21 0654)     FiO2: Oxygen Therapy  SpO2: 97 %  Pulse Ox Frequency: continuous  HR-SpO2: 85 bpm  O2 Delivery Source: None (Room Air)  Flow (L/min): 2  Set PEEP: 5 cmH2O  Oxygen Concentration (%): 21  FiO2: 40 %    I/O:     Emesis:     None  BM:    Date of Last Bowel Movement: 02/27/21  Heme:     N/A    Nutrition/Residuals:  MNT PROTOCOL FOR DIETITIAN  DIET RENAL (60G PRO,2G NA,2G K) Restrict  fluids to: 1500 ML  DIETARY ORAL SUPPLEMENTS Oral Supplements with tray: Magic Cup-Orange; LUNCH/DINNER; 1 Each    Blood Sugars:   No results found for this encounter      Physical Exam:  General:  no apparent distress.  Does not appear acutely toxic.  Eyes: Pupils equal and reactive  bilaterally.  No scleral icterus.  ENT: Mucous membranes moist.  Edentulous.  Neck: Supple.  No cervical lymphadenopathy  Lungs:  clear to diminished breath sounds bilaterally with bibasilar crackles.  Equal lateral chest wall expansion with inhalation  Cardiovascular: Regular rate and rhythm.  No murmur.  Trace bilateral lower extremity edema  Lines:  Right upper extremity PICC present without surrounding erythema drainage  Abdomen: Nondistended, soft, and nontender to palpation.  Normoactive bowel sounds  Extremities:  No warm, red, swollen joints.  No cyanosis of digits.  Skin: Warm and dry.  Resolving vasculitic rash on lower legs.  Surgical incision on chest wall well approximated without surrounding erythema drainage  Neurologic:  Spontaneously moves all 4 limbs.  No tremors.  Psychiatric: Pleasant a affect.  Alert  and oriented x3    Labs:  I have reviewed the lab results.    CBC:  Recent Labs     02/26/21  0509 02/27/21  0544 02/28/21  0545   WBC 6.5 8.1 6.4   HGB 9.0* 6.9* 7.3*   HCT 27.3* 21.3* 23.2*   PLTCNT 262 265 263     Differential:   Recent Labs     02/26/21  0509 02/27/21  0544 02/28/21  0545   PMNS 72 65 62   MONOCYTES _0 BASOPHILS 1  <0.10 1  <0.10 2  0.10   PMNABS 4.63 5.21 3.99   LYMPHSABS 1.26 2.00 1.61   MONOSABS 0.41 0.66 0.55   EOSABS <0.10 <0.10 <0.10     BMP:  Recent Labs     02/26/21  0509 02/27/21  0544 02/28/21  0545   SODIUM 135* 138 139   POTASSIUM 3.8 3.9 3.8   CHLORIDE 106 109 110   CO2 17* 17* 19*   BUN 61* 58* 57*   CREATININE 1.90* 1.85* 1.70*   GLUCOSENF 110 102 88   ANIONGAP _1 GFR 35* 36* 40*   CALCIUM 8.4* 8.5 8.3*   MAGNESIUM 2.2 2.0 2.0   PHOSPHORUS 7.3* 6.5* 7.3*     RBC  Indices:  Recent Labs     02/26/21  0509 02/27/21  0544 02/28/21  0545   MCV 81.3 83.5 85.3   MCH 26.8 26.7 26.8   MCHC 33.0 31.9 31.5   MPV 8.0* 8.1* 8.1*     Coagulation Studies:  Recent Labs     02/26/21  0509 02/27/21  0545 02/28/21  0545   INR 1.61* 1.45* 1.47*   PROTHROMTME 18.7* 16.8* 17.0*     Liver/Pancreas:  Recent Labs     02/27/21  0544   TOTALPROTEIN 7.1   ALBUMIN 2.8*   AST 19   ALT 10   ALKPHOS 112*   BILIRUBINCON 0.4   TOTBILIRUBIN 0.7     Inflammatory Markers:  Recent Labs     02/26/21  0509 02/27/21  0544   CREAPROINFLA 60.7* 52.2*     Antimicrobial Markers:  No results for input(s): VANCOMYCINTR, CPK in the last 72 hours.    Radiology:   I have reviewed the images.   Results for orders placed or performed during the hospital encounter of 02/05/21 (from the past 72 hour(s))   XR AP MOBILE CHEST     Status: None    Narrative    Nelson  Female, 35 years old.    XR AP MOBILE CHEST performed on 02/26/2021 6:09 AM.    REASON FOR EXAM:  s/p tvr    TECHNIQUE: 1 view/1 image(s) submitted for interpretation.    FINDINGS: Compared to 02/25/2021. There is a shallow inspiration. The heart is stable in size. Bilateral patchy opacities and nodules are noted. No visible pneumothorax. Metallic foreign body projects within the soft tissues of the base of the right neck, likely broken needle fragment.    Right pigtail chest tube has been removed. Right PICC is stable.        Impression    Stable lung aeration.         CT CHEST ABDOMEN PELVIS WO IV CONTRAST     Status: None    Narrative    Laramie Ferencz  Female, 35 years old.    CT CHEST ABDOMEN PELVIS WO IV CONTRAST performed on 02/26/2021 9:22 AM.  REASON FOR EXAM:  Known history of native tricuspid valve endocarditis with septic pulmonary emboli status-post tricuspid valve replacement - exam ordered to assess for areas of active infection prior to enrollment in a clinical trial (OPTIMAL Trial)    RADIATION DOSE: 916.50 mGycm    COMPARISON: CT chest abdomen  pelvis 02/05/2021    FINDINGS:   CHEST:  LUNGS/PLEURA: Redemonstration of numerous solid and cavitary pulmonary masses bilaterally consistent with septic emboli. The largest in the right lower lobe appears improved in size versus fluid within the cavitary component and lower lung volumes seen in the right series 3, image 205. The groundglass opacities and interlobular septal thickening have improved from prior exam. More consolidative opacities and right pleural effusion however have mildly increased in size. There appears to be a trace hydropneumothorax on the right series 2, image 61.    HEART/MEDIASTINUM: Heart size is mildly prominent with questionable loculated right pleural effusion versus pericardial effusion as seen on series 2, image 64.Marland Kitchen Postsurgical changes from tricuspid valve replacement is noted. Mediastinal structures are otherwise not well assessed due to inflammation and consolidation within the anterior mediastinum likely postsurgical changes. The aorta and pulmonary arteries appear within normal limits of size. Thyroid appears within normal limits. Right subclavian catheter with the tip in the low SVC right atrial junction is noted.    LYMPH NODES: No enlarged axillary lymph nodes with numerous subcentimeter lymph nodes noted.    BONES/SOFT TISSUES: Postsurgical changes from median sternotomy are noted. No acute vertebral height loss within the thoracic spine. Diffuse anasarca is noted.    ABDOMEN/PELVIS:   LIVER: Liver is enlarged measuring 25.3 cm in length along the right hepatic lobe.    BILIARY SYSTEM/GALLBLADDER: No biliary dilatation. Gallbladder is mildly distended without wall thickening.    PANCREAS: Within normal limits.    KIDNEY/URETERS: No renal calculi or hydronephrosis.    ADRENALS: No definite adrenal nodules.    SPLEEN: Spleen is enlarged measuring 16.8 cm in length.    BOWEL: No abnormally dilated small bowel loops are seen. Scattered stool throughout the colon without evidence  of obstruction.    VASCULATURE/LYMPH NODES: Visualized portion of the aorta is within normal limits. Difficult assessment for adenopathy due to lack of IV contrast and nonopacification of numerous bowel loops.    BLADDER: No asymmetric bladder wall thickening.    REPRODUCTIVE ORGANS: Uterus is not well-visualized.    PERITONEAL CAVITY: Small amount of ascites in the pelvis.    BONES/SOFT TISSUES: No acute vertebral height loss within the lumbar spine. Mild anasarca is noted.      Impression    1.Postsurgical changes from tricuspid valve repair with loculated fluid along the right heart border which may represent a loculated pericardial effusion versus loculated pleural effusion.  2.Redemonstration of numerous septic emboli in the bilateral lungs with improving groundglass and intralobular septal thickening. There is however mild interval worsening of small right pleural effusion and right basilar consolidation with trace hydropneumothorax on the right.  3.Marked hepatosplenomegaly.  4.Persistent small amount of ascites in the pelvis.       EKG:   I have reviewed the tracings.   Recent Results (from the past 720 hour(s))   ECG 12-LEAD    Collection Time: 02/05/21  1:00 AM   Result Value    Ventricular rate 99    Atrial Rate 99    PR Interval 136    QRS Duration 86    QT Interval 338    QTC Calculation 433  Calculated P Axis 41    Calculated R Axis 13    Calculated T Axis 21    Narrative    Normal sinus rhythm  Normal ECG  No previous ECGs available  Preliminary by fellow Harlow Asa, MD, AHMED [phone]) on 02/05/2021 1:22:59 PM  Confirmed by Pasty Spillers MD, ANTHONY ([id number]) on 02/05/2021 7:19:09 PM     Microbiology:   I have reviewed the microbiology.  Hospital Encounter on 02/05/21 (from the past 96 hour(s))   ADULT ROUTINE BLOOD CULTURE, SET OF 2 ADULT BOTTLES (BACTERIA AND YEAST)    Collection Time: 02/26/21  3:23 AM    Specimen: Blood   Culture Result Status    BLOOD CULTURE, ROUTINE No Growth 2 Days Preliminary   ADULT ROUTINE  BLOOD CULTURE, SET OF 2 ADULT BOTTLES (BACTERIA AND YEAST)    Collection Time: 02/26/21 10:41 AM    Specimen: Blood   Culture Result Status    BLOOD CULTURE, ROUTINE No Growth 18-24 hrs. Preliminary     Pathology:   I have reviewed the pathology.   Results for orders placed or performed during the hospital encounter of 02/05/21   SURGICAL PATHOLOGY SPECIMEN   Result Value    Final Diagnosis      A.  SKIN, RIGHT LOWER EXTREMITY, PUNCH BIOPSY:  - Leukocytoclastic vasculitis.  (See Comment.)    B.  SKIN, RIGHT LOWER EXTREMITY, PUNCH BIOPSY FOR DIRECT IMMUNOFLUORESCENCE:   - Deposits of IgM, C3 and fibrinogen in a perivascular pattern in the upper dermis.   - No specific deposits of IgG and IgA identified on cryostat examined sections.      Diagnosis Comment      A.  The specimen shows perivascular infiltrate of neutrophils with fragmented nuclear debris (leukocytoclasis). The neutrophils are extending into the epidermis with neutrophilic exocytosis. There is extravasation of erythrocytes in the dermis and fibrinoid necrosis of blood vessels. PAS fungal stain is negative for fungal microorganisms. 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 Clinical correlation is recommended.    B.  The immunofluorescent features are consistent with leukocytoclastic vasculitis.   (The immunoreactant and special stain controls were appropriately reactive       Microscopic Description      Performed.      Gross Description      A. Skin.  Part A is received in formalin labeled "Natali, Lavallee" and "right lower extremtiy". It consists of a 0.4 x 0.4 cm circular piece of tan skin excised to a depth of 0.4 cm.  The specimen is bisected and submitted entirely in cassette A1.    B. Skin.  Part B is received in [employer] labeled "Jessicca, Stitzer" and "right lower extremtiy". It consists of a 0.4 x 0.4 cm circular piece of tan skin excised to a depth of 0.4 cm.  The specimen is submitted entirely for  immunofluorescence studies.     SURGICAL PATHOLOGY SPECIMEN   Result Value    Final Diagnosis      Tricuspid valve, resection:  . Valve tissue with fibrosis and adherent vegetations containing numerous colonies of bacterial organisms, consistent with infective endocarditis      Microscopic Description      Performed.      Clinical History      . Tricuspid regurgitation - severe  . MRSA Bacteremia  . IV Drug Abuse  . Septic Emboli      Gross Description      A. Heart.  Part A is received in formalin, labeled "  Wyn Forster" and "tricuspid valve". It consists of a 2.5 x 2.0 x 0.5 cm aggregate of tan-pink hemorrhagic shaggy pliable valve tissue with attached red-brown vegetations.  The specimen is submitted in toto in cassette A1.      Addendum Report      Special stains for microorganisms (Gram and GMS) were performed, with appropriate controls.  These confirm numerous colonies of predominantly Gram positive coccal organisms.  GMS stain further highlights the nonviable forms.       PT/OT: Yes    Consults:   IP CONSULT TO NEPHROLOGY - ORDERING PROVIDER MUST CALL/PAGE SERVICE  IP CONSULT TO INFECTIOUS DISEASES - ORDERING PROVIDER MUST CALL/PAGE SERVICE  IP CONSULT TO NUTRITION SERVICES  IP CONSULT TO CARE MANAGEMENT  SUICIDE RISK ASSESSMENT - CONSULT (IP ONLY)  IP CONSULT TO DERMATOLOGY - ORDERING PROVIDER MUST CALL/PAGE SERVICE  IP CONSULT TO PSYCHIATRY - SUBSTANCE ABUSE DISORDER - ORDERING PROVIDER MUST CALL/PAGE SERVICE     Current Inpatient Medications: acetaminophen (TYLENOL) tablet, 650 mg, Oral, Q4H PRN  aspirin chewable tablet 81 mg, 81 mg, Oral, Daily  B complex-vitamin C-folic acid (NEPHROCAPS) capsule, 1 Capsule, Oral, Daily  bisacodyl (DULCOLAX) rectal suppository, 10 mg, Rectal, Daily PRN  bumetanide (BUMEX) 0.25 mg/mL injection, 1 mg, Intravenous, Daily  buprenorphine-naloxone (SUBOXONE) 2-0.84m per sublingual film, 2 Film, Sublingual, 2x/day  calcium carbonate (TUMS) 5040m(20055mlemental calcium)  chewable tablet, 500 mg, Oral, 3x/day PRN  DAPTOmycin (CUBICIN) 550 mg in NS 50 mL IVPB, 8 mg/kg, Intravenous, Q24H  famotidine (PEPCID) tablet, 20 mg, Oral, Daily  hydrALAZINE (APRESOLINE) injection 10 mg, 10 mg, Intravenous, Q4H PRN  ipratropium (ATROVENT) 0.02% nebulizer solution, 0.5 mg, Nebulization, Q4H PRN  lidocaine-menthol (LIDOPATCH) 3.6%-1.25% patch, 1 Patch, Transdermal, Daily  lidocaine-menthol (LIDOPATCH) 3.6%-1.25% patch, 1 Patch, Transdermal, Daily  lidocaine-menthol (LIDOPATCH) 3.6%-1.25% patch, 1 Patch, Transdermal, Daily  [START ON 03/02/2021] linezolid (ZYVOX) tablet, 600 mg, Oral, 2x/day  magnesium hydroxide (MILK OF MAGNESIA) 400m28mr 5mL 68ml liquid, 30 mL, Oral, Daily PRN  melatonin tablet, 6 mg, Oral, NIGHTLY  metoprolol tartrate (LOPRESSOR) tablet, 25 mg, Oral, 2x/day  miconazole nitrate (SECURA) 2% topical cream, , Apply Topically, 2x/day  nicotine polacrilex (COMMIT) lozenge, 2 mg, Oral, Q2H PRN  NS flush syringe, 10-30 mL, Intracatheter, Q8HRS  NS flush syringe, 20-30 mL, Intracatheter, Q1 MIN PRN  ondansetron (ZOFRAN) 2 mg/mL injection, 4 mg, Intravenous, Q8H PRN  perflutren lipid microspheres (DEFINITY) 1.3 mL in NS 10 mL (tot vol) injection, 2 mL, Intravenous, Give in Cardiology  polyethylene glycol (MIRALAX) oral packet, 34 g, Oral, Daily  QUEtiapine (SEROQUEL) tablet, 100 mg, Oral, NIGHTLY  rifAMPin (RIFADIN) capsule, 300 mg, Oral, 2x/day  sennosides-docusate sodium (SENOKOT-S) 8.6-50mg per tablet, 2 Tablet, Oral, 2x/day  sevelamer carbonate (RENVELA) oral powder, 2,400 mg, Oral, 3x/day-Meals  sodium bicarbonate tablet, 1,300 mg, Oral, 3x/day  sodium chloride 3% nebulizer solution, 3 mL, Nebulization, Q6H PRN  sodium citrate 4% (3 mL) injection syringe, 2 Syringe, Intracatheter, Q1H PRN  triamcinolone acetonide (ARISTOCORT A) 0.1% topical ointment, , Apply Topically, 2x/day  Warfarin Placeholder, , Does not apply, Daily PRN    Allergies:  No Known  Allergies    ASSESSMENT/PLAN:    Inpatient Summary: 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  She initially presented to the emergency department at [facility] on 01/31/2021 with drug overdose, and she was admitted there 02/01/2021-02/04/2021 prior to being transferred to our facility.  She was intubated for airway protection.  She had progression of acute kidney injury to anuric acute renal failure requiring dialysis through a temporary dialysis catheter.  She developed anemia requiring blood transfusion and thrombocytopenia.  Blood cultures 02/01/2021-02/04/2021 grew MRSA (vancomycin MIC 2, rifampin MIC 1) with clearance of bacteremia not achieved at that time.  Chest radiograph on 01/31/2021 showed right middle lobe patchy opacity concerning for aspiration, pulmonary vascular congestion, and cardiomegaly.  CT brain without contrast on 01/31/2021 showed no acute intracranial abnormality.  Renal ultrasound on 02/02/2021 was unremarkable.  Transthoracic echocardiogram on 02/03/2021 showed left ventricular ejection fraction 55-60%, large tricuspid vegetation 1.91 cm X 1.67 cm with moderate tricuspid regurgitation, and trivial mitral regurgitation.  Repeat CT brain without contrast on 02/04/2021 showed no acute intracranial abnormality.  CT chest without contrast on 02/04/2021 showed multiple bilateral pulmonary nodules (solid, groundglass, and cavitary) predominantly located peripherally consistent with septic pulmonary emboli, small bilateral  pleural effusion, and marked splenomegaly.  The patient received ceftriaxone on 01/31/2021, vancomycin 02/02/2021-02/04/2021, piperacillin-tazobactam 02/01/2021-02/02/2021, and cefepime and metronidazole 02/02/2021-02/04/2021.  Following arrival at our facility, blood cultures 02/05/2021-02/15/2021 grew MRSA (vancomycin MIC 1, daptomycin MIC 0.5, ceftaroline susceptible, rifampin MIC <=0.5) with clearance of bacteremia ultimately achieved postoperatively on 02/16/2021.  BAL cultures on 02/05/2021 and 02/13/2021 also grew MRSA.  Transthoracic echocardiogram on 02/05/2021 showed left ventricular ejection fraction 62.6%, tricuspid vegetation 1.4 cm X 0.8 cm with mild tricuspid regurgitation, and normal appearing mitral valve.  MRI brain without contrast on 02/06/2021 showed no acute intracranial abnormality.  MRIs cervical, thoracic, and lumbosacral spine without contrast on 02/10/2021 showed no evidence of infection.  Repeat transthoracic echocardiogram on 02/11/2021 showed tricuspid vegetation with severe tricuspid regurgitation.  The patient underwent extraction of remaining teeth on 02/09/2021.  She underwent bioprosthetic tricuspid valve replacement with primary closure of patient foramen ovale on 02/13/2021 when operative culture grew MRSA.  Surgical pathology showed valve tissue with fibrosis and adherent vegetations containing numerous colonies of bacterial organisms (predominantly gram positive cocci with nonviable forms) consistent with endocarditis.  She had her last dialysis session on 02/14/2021 with removal of her most recent left femoral temporary dialysis catheter at that time.  She developed a rash around 02/15/2021 that raised concern for vancomycin-induced rash.  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  Chest radiographs 02/17/2021-02/23/2021 showed bilateral pleural effusions with partially loculated right pleural effusion reported as of 02/22/2021.  The  patient underwent right chest tube insertion on 02/23/2021 with immediate drainage of approximately 1 liter of bloody fluid consistent with hemothorax and subsequent removal on 02/25/2021.  Right pleural fluid culture on 02/23/2021 had no growth with no organisms on Gram stain.  The patient received vancomycin 02/06/2021-02/14/2021 prior to being transitioned to daptomycin (02/17/2021-present) and rifampin (02/16/2021-present).  Plan is for a total 6-week antimicrobial course dated from negative blood cultures (end date 03/29/2021).  As clearance of bacteremia was not achieved preoperatively and operative cultures were positive, will follow the initial 6-weeks of treatment with doxycycline oral suppression for 6 months.  Pre-enrollment testing for the OPTIMAL Trial performed on 02/26/2021 included blood cultures X 2 sets that are engative; CT chest, abdomen, and pelvis without contrast that showed postoperative  changes from tricuspid valve repair with loculated fluid along right heart border (similar appearance to right hemothorax prior to chest tube drainage; felt to be postoperative fluid/small amount of bloody fluid), redemonstration of numerous septic emboli in bilateral lungs with improving groundglass and intralobular septal thickening, mild interval worsening of small right pleural effusion and right basilar consolidation with trace hydropneumothorax on right, and marked hepatosplenomegaly; and transthoracic echocardiogram that showed left ventricular ejection fraction 61.2% and tricuspid bioprosthesis with normal leaflet motion and no paravalvular tricuspid regurgitation.  Our Psychiatry/Substance Use Treatment evaluated the patient and assisted with transition to Suboxone on 02/27/2021.  The patient consented for enrollment in the [facility] and has been randomized to the oral antimicrobial arm (beginning 03/02/2021).  Her antimicrobial regimen will change to linezolid 600 mg PO BID and rifampin 300 mg PO BID for the  last 4 weeks (03/02/2021-03/29/2021) of a total 6-week antimicrobial treatment course prior to transitioning to doxycycline oral suppression (as of 03/30/2021) for 6 months.    Patient requires HIGH RISK DECISION MAKING due to the risk of complications, significant morbidity and/or mortality associated with the necessary therapy, specifically drug therapy, that requires intensive monitoring for toxicity resulting from the narrow therapeutic index of medications the patient is currently being administered, including but not limited to: antimicrobials such as Vancomycin, Daptomycin, and Gentamicin; new Warfarin/coumadin for anticoagulation for new valve replacement; opioids for pain management in a patient with a history of substance abuse;  and/or medical alternative therapy such as Suboxone for history of substance abuse.     Additionally, this patient may require HIGH RISK DECISION MAKING for a chronic illness which poses a threat to life or bodily function, specifically psychiatric illness of addiction/substance misuse (if listed below) with a potential threat to self, as this condition directly lead to the patient's life-threatening infection and current need for hospitalization.     Does the patient have a history of an underlying substance use disorder? 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 Renita Papa  - Microbiology:   [facility]:   - 02/01/2021 Blood cultures X 2 pediatric sets: MRSA (vancomycin MIC 2, rifampin MIC <1, tetracycline MIC <4) in 2 of 2 bottles   - 02/02/2021 Blood cultures X 2 sets: MRSA  in 2 of 4 bottles (both sets)   - 02/02/2021 Sputum culture: MRSA as well as yeast and usual flora   - 02/04/2021 Blood cultures X 2 sets: MRSA in 2 of 4 bottles (both sets)   [facility]:   - 02/05/2021 BAL culture: 15,000 MRSA   - 02/05/2021 Blood cultures X 2 sets: MRSA (vancomycin MIC 1, daptomycin MIC 0.5, ceftaroline susceptible, rifampin MIC <=0.5, linezolid MIC 2, tetracycline MIC <=1, doxycycline MIC <=0.5) in 2 of 2 sets   - 02/07/2021 Blood cultures X 2 sets: MRSA in 2 of 2 sets   - 02/09/2021 Blood cultures X 2 sets: MRSA in 2 of 2 sets   - 02/11/2021 Blood cultures X 2 sets: MRSA in 2 of 2 sets   - 02/13/2021 Blood cultures X 2 sets: MRSA in 2 of 2 sets   - 02/13/2021 Operative culture from tricuspid valve tissue:  MRSA   - 02/13/2021 BAL culture: MRSA   - 02/14/2021 Blood cultures X 2 sets: MRSA in 2 of 2 sets   - 02/15/2021 Blood cultures X 2 sets: MRSA in 2 of 2 sets   - 02/16/2021 Blood cultures X 2 sets: No growth (clearance of bacteremia achieved)   - 02/23/2021 Right pleural fluid culture: No growth   - 02/25/2021 Blood cultures X 2 sets: Pending  - Pathology: 02/13/2021 Surgical pathology: Valve tissue with fibrosis and adherent vegetations containing numerous colonies of bacterial organisms (predominantly gram positive cocci with nonviable forms) consistent with endocarditis  - Antimicrobial Therapy: Daptomycin 8 mg/kg IV Q24 hours + rifampin 300 mg PO BID  - Duration of antimicrobial therapy and end date: 6 weeks (2 weeks IV + 4 weeks PO) dated from negative blood cultures (02/16/2021 = day 1); end date 03/29/2021   - Transition to linezolid 600 mg PO BID and rifampin 300 mg PO BID for the last 4 weeks (03/02/2021-03/29/2021) of a total 6-week antimicrobial treatment course; patient's Medicaid termed - linezolid and rifampin able to be provided at [facility] $24.46 total, which patient is able to afford (and patient will contact [facility] to restart her Medicaid); counseling regarding both  antimicrobials was provided to the patient   - Following completion of the above regimen, transitioning to doxycycline oral suppression (as of 03/30/2021) for 6 months - will prescribe at time of follow-up in Infectious Diseases Clinic unless also able to get at [facility] on discharge  - Location of central access and date placed: PICC placed in right brachial vein on 02/21/2021 - remove prior to discharge  - Labs: Minimum of twice weekly CBC with differential, BMP, magnesium, hepatic function panel, and CRP as well as CK weekly while inpatient; weekly CBC with differential, BUN, creatinine, total bilirubin, AST, ALT, alkaline phosphatase, and CRP after discharge (external orders placed)  - Additional medications: Aspirin 81 mg PO daily, bumetanide 1 mg IV daily, metoprolol tartrate 25 mg PO BID    Anticoagulation:  - Indication: Tricuspid valve replacement  - Medication/Therapy: Warfarin  - Current dose:  Increase to 2 mg q.h.s.  - Anticoagulation needed at discharge: Yes - 3 months postoperatively unless otherwise specified by Dr. Renita Papa  - Goal INR: 2  - Last INR:  1.47  - Outpatient INR Monitoring: Cardiac Surgery team will discuss INR monitoring with Dr. Renita Papa on 02/28/2021 assuming anticoagulation is continued; will need close monitoring of INR once rifampin is completed as warfarin dose will likely need decreased at that time    Pain Control:   - Current Regimen:   - Acetaminophen 650 mg PO Q4 hours prn   - Lidocaine patch 3 TD daily  - Bowel regimen: Senokot-S 2 tabs PO BID, Miralax 34 g daily    History of Substance Misuse:  - Diagnosis: Opioid use disorder, Stimulant use disorder, Alcohol use disorder in remission, Sedative-hypnotic use disorder in remission  - Substance(s) Used: Heroin, methamphetamine, prior alcohol, prior gabapentin  - Medical/Withdrawal Therapy: N/A  - Evaluation by Psychiatry: Yes  - Agreeable to individual therapy: TBD  - Agreeable to group therapy: TBD  - Agreeable to  outpatient therapy: Yes  - Agreeable to medical alternative therapy: Yes - transitioned to Suboxone on 02/27/2021  - Agreeable to DDU: N/A  - Agreeable to [facility]: TBD  - Agreeable to [place]: TBD  - Risk to discharge with PICC: High    Screening:  - UDS (  on admission): Not done  - UDS (most recent): 02/27/2021 Positive for buprenorphine and oxycodone (had just been given both while inpatient)  - Syphilis Screening: Negative  - Urine Gonorrhea/Chlamydia: Previously ordered  - Tdap: Discuss before discharge  - SARS-CoV-2 (COVID-19) Vaccination: Discuss before discharge  - SARS-CoV-2 (COVID-19) Screening: 02/27/2021 negative    Cleared hepatitis C   - Antibody Positive Date: 02/05/2021  - Quantitative RNA PCR Value: 02/05/2021 Target not detected  - HIV Screen: Negative  - Hep A IgG: Reactive (immune)  - Hep B Surface Ag: Negative  - Hep B Surface Ab: 497  - Hep B Core Ab: Negative    Right hemothorax - improved  - Status post right chest tube insertion on 02/23/2021 and removal on 02/25/2021    Idiopathic postoperative anemia - still present  - Most recently transfused PRBCs on 02/21/2021, 02/24/2021, and 02/27/2021  - Follow hemoglobin and hematocrit; transfuse additional PRBCs for hemoglobin <7  - Cardiac Surgery will discuss the patient's warfarin with Dr. Renita Papa tomorrow and provide recommendations    Acute kidney injury on possible CKD 3 - improving  - Nephrology following  - Decrease bumetanide to 1 mg IV Q24 hours; will need to finalize recommendations regarding an oral diuretic regimen prior to discharge  - Continue sodium bicarbonate 650 mg PO TID  - Continue Nephrocaps 1 capsule PO daily  - Monitor BMP, magnesium, and phosphorus and I/Os    Leukocytoclastic vasculitis - improved  - Confirmed by punch biopsy pathology on 02/16/2021  - Continue triamcinolone 0.1% topical ointment BID for now    Insomnia - improved  - Continue melatonin 5 mg PO nightly    Chronic Issues:   Unspecified anxiety and  depressive disorders - improved  - Continue quetiapine 100 mg PO nightly    Tobacco use disorder - stable  - Counsel regarding smoking cessation this admission  - Continue nicotine replacement therapy    Resolved Issues:   Dialysis requirement    DVT/PE Prophylaxis/Anticoagulation: Warfarin    Disposition: Home with OPTIMAL Trial Oral Antimicrobial Arm/COpAT on 03/02/2021    Follow Up Appointments:  PCP/TCC: Set up a PCP in [place], [place], on discharge; follow-up in [facility] 1 week after discharge if unable to establish with PCP that soon  Infectious Diseases: Telephone follow-up with Dr. Alfonzo Feller in [facility] on 03/15/2021 at 11:00 AM (being scheduled) as well as in-person follow-up 1 month after discharge on same day as appointment with Cardiac Surgery (will schedule before discharge)  Cardiac Surgery: Follow up with Dr. Renita Papa 1 month after discharge (will schedule before discharge)  Psychiatry: Follow up in [facility] (preferred if feasible) or [place] with release of information to obtain record of appointment attendance while completing treatment phase with oral antimicrobial therapy    My clinical impression/findings:  Clinically patient is stable today.  Overall seems to be showing good progress.  Surgical incision on chest wall as well healing.  Encouraged to continue to ambulate as much as possible.  Continue current dose of Suboxone.  Continue antimicrobial therapy as above.  Blood laboratory work ordered for tomorrow morning.    My independent time spent on this patient including:   . Chart Review   . Counseling or educating the patient, family or caregiver  . Ordering medications, tests or procedures  . Care coordination   . Referring the patient to and communicating with other health care professionals   . Face to Face Time  . Charting Time  Physician Time:  44 minutes  Total Time:  44 minutes    Sahej Hauswirth Maebelle Munroe MD, PhD 02/28/2021, 07:24  Assistant Professor,   Sections  of Hospital Medicine and Infectious Disease  Department of [facility].

## 2021-03-01 ENCOUNTER — Other Ambulatory Visit: Payer: Self-pay

## 2021-03-01 ENCOUNTER — Telehealth (INDEPENDENT_AMBULATORY_CARE_PROVIDER_SITE_OTHER): Payer: Self-pay | Admitting: Pharmacist

## 2021-03-01 DIAGNOSIS — F1310 Sedative, hypnotic or anxiolytic abuse, uncomplicated: Secondary | ICD-10-CM

## 2021-03-01 DIAGNOSIS — Z954 Presence of other heart-valve replacement: Secondary | ICD-10-CM | POA: Insufficient documentation

## 2021-03-01 DIAGNOSIS — G4700 Insomnia, unspecified: Secondary | ICD-10-CM

## 2021-03-01 DIAGNOSIS — F32A Depression, unspecified: Secondary | ICD-10-CM

## 2021-03-01 DIAGNOSIS — F1510 Other stimulant abuse, uncomplicated: Secondary | ICD-10-CM

## 2021-03-01 LAB — CBC WITH DIFF
BASOPHIL #: <0.1 10*3/uL (ref <=0.20)
BASOPHIL %: 1 %
EOSINOPHIL #: <0.1 10*3/uL (ref <=0.50)
EOSINOPHIL %: 0 %
HCT: 22.6 % — ABNORMAL LOW (ref 34.8–46.0)
HGB: 7.3 g/dL — ABNORMAL LOW (ref 11.5–16.0)
IMMATURE GRANULOCYTE #: <0.1 10*3/uL (ref <0.10)
IMMATURE GRANULOCYTE %: 1 % (ref 0–1)
LYMPHOCYTE #: 1.59 10*3/uL (ref 1.00–4.80)
LYMPHOCYTE %: 23 %
MCH: 27.3 pg (ref 26.0–32.0)
MCHC: 32.3 g/dL (ref 31.0–35.5)
MCV: 84.6 fL (ref 78.0–100.0)
MONOCYTE #: 0.54 10*3/uL (ref 0.20–1.10)
MONOCYTE %: 8 %
MPV: 8.2 fL — ABNORMAL LOW (ref 8.7–12.5)
NEUTROPHIL #: 4.75 10*3/uL (ref 1.50–7.70)
NEUTROPHIL %: 67 %
PLATELETS: 237 10*3/uL (ref 150–400)
RBC: 2.67 10*6/uL — ABNORMAL LOW (ref 3.85–5.22)
RDW-CV: 17.7 % — ABNORMAL HIGH (ref 11.5–15.5)
WBC: 7 10*3/uL (ref 3.7–11.0)

## 2021-03-01 LAB — BASIC METABOLIC PANEL
ANION GAP: 11 mmol/L (ref 4–13)
BUN/CREA RATIO: 34 — ABNORMAL HIGH (ref 6–22)
BUN: 60 mg/dL — ABNORMAL HIGH (ref 8–25)
CALCIUM: 7.8 mg/dL — ABNORMAL LOW (ref 8.5–10.0)
CHLORIDE: 106 mmol/L (ref 96–111)
CO2 TOTAL: 18 mmol/L — ABNORMAL LOW (ref 22–30)
CREATININE: 1.74 mg/dL — ABNORMAL HIGH (ref 0.60–1.05)
ESTIMATED GFR: 39 mL/min/BSA — ABNORMAL LOW (ref >=60)
GLUCOSE: 118 mg/dL (ref 65–125)
POTASSIUM: 3.8 mmol/L (ref 3.5–5.1)
SODIUM: 135 mmol/L — ABNORMAL LOW (ref 136–145)

## 2021-03-01 LAB — PT/INR
INR: 1.42 — ABNORMAL HIGH (ref 0.80–1.20)
PROTHROMBIN TIME: 16.5 seconds — ABNORMAL HIGH (ref 9.1–13.9)

## 2021-03-01 LAB — MAGNESIUM: MAGNESIUM: 1.9 mg/dL (ref 1.8–2.6)

## 2021-03-01 LAB — C-REACTIVE PROTEIN(CRP),INFLAMMATION: CRP INFLAMMATION: 37.5 mg/L — ABNORMAL HIGH (ref <8.0)

## 2021-03-01 LAB — PHOSPHORUS: PHOSPHORUS: 6.9 mg/dL — ABNORMAL HIGH (ref 2.4–4.7)

## 2021-03-01 LAB — CREATINE KINASE (CK), TOTAL, SERUM: CREATINE KINASE: 10 U/L — ABNORMAL LOW (ref 25–190)

## 2021-03-01 MED ORDER — POLYETHYLENE GLYCOL 3350 17 GRAM ORAL POWDER PACKET
34.0000 g | Freq: Every day | ORAL | Status: DC
Start: 2021-03-02 — End: 2021-03-06

## 2021-03-01 MED ORDER — ASPIRIN 81 MG CHEWABLE TABLET
81.0000 mg | CHEWABLE_TABLET | Freq: Every day | ORAL | 0 refills | Status: DC
Start: 2021-03-02 — End: 2021-03-02
  Filled 2021-03-01: qty 30, 30d supply, fill #0

## 2021-03-01 MED ORDER — BUPRENORPHINE 2 MG-NALOXONE 0.5 MG SUBLINGUAL FILM
2.0000 | ORAL_FILM | Freq: Two times a day (BID) | SUBLINGUAL | Status: DC
Start: 2021-03-01 — End: 2021-03-06

## 2021-03-01 MED ORDER — METOPROLOL TARTRATE 25 MG TABLET
25.0000 mg | ORAL_TABLET | Freq: Two times a day (BID) | ORAL | 0 refills | Status: DC
Start: 2021-03-01 — End: 2021-03-02
  Filled 2021-03-01: qty 60, 30d supply, fill #0

## 2021-03-01 MED ORDER — VITAMIN B COMPLEX AND VITAMIN C NO.20-FOLIC ACID 1 MG CAPSULE
1.0000 | ORAL_CAPSULE | Freq: Every day | ORAL | 0 refills | Status: DC
Start: 2021-03-02 — End: 2021-03-02
  Filled 2021-03-01: qty 30, 30d supply, fill #0

## 2021-03-01 MED ORDER — SEVELAMER CARBONATE 0.8 GRAM ORAL POWDER PACKET
2400.0000 mg | Freq: Three times a day (TID) | ORAL | 0 refills | Status: DC
Start: 2021-03-01 — End: 2021-03-02
  Filled 2021-03-01: qty 90, 10d supply, fill #0

## 2021-03-01 MED ORDER — HYDROXYZINE PAMOATE 50 MG CAPSULE
50.0000 mg | ORAL_CAPSULE | Freq: Four times a day (QID) | ORAL | 0 refills | Status: DC | PRN
Start: 2021-03-01 — End: 2021-03-02
  Filled 2021-03-01: qty 45, 12d supply, fill #0

## 2021-03-01 MED ORDER — SENNOSIDES 8.6 MG-DOCUSATE SODIUM 50 MG TABLET
2.0000 | ORAL_TABLET | Freq: Two times a day (BID) | ORAL | Status: DC
Start: 2021-03-01 — End: 2021-03-06

## 2021-03-01 MED ORDER — FAMOTIDINE 20 MG TABLET
20.0000 mg | ORAL_TABLET | Freq: Every day | ORAL | 0 refills | Status: DC
Start: 2021-03-02 — End: 2021-03-02
  Filled 2021-03-01: qty 30, 30d supply, fill #0

## 2021-03-01 MED ORDER — SODIUM BICARBONATE 650 MG TABLET
1300.0000 mg | ORAL_TABLET | Freq: Three times a day (TID) | ORAL | 0 refills | Status: DC
Start: 2021-03-01 — End: 2021-03-02
  Filled 2021-03-01: qty 180, 30d supply, fill #0

## 2021-03-01 MED ORDER — MELATONIN 3 MG TABLET
6.0000 mg | ORAL_TABLET | Freq: Every evening | ORAL | 0 refills | Status: DC
Start: 2021-03-01 — End: 2021-03-02
  Filled 2021-03-01: qty 60, 30d supply, fill #0

## 2021-03-01 MED ORDER — QUETIAPINE 50 MG TABLET
50.0000 mg | ORAL_TABLET | Freq: Every evening | ORAL | 0 refills | Status: DC
Start: 2021-03-01 — End: 2021-03-02
  Filled 2021-03-01: qty 30, 30d supply, fill #0

## 2021-03-01 NOTE — Ancillary Notes (Signed)
Patient rested during the night. Was polite and cooperative.        [name], ADDICTION SPECIALIST   03/01/2021, 03:45

## 2021-03-01 NOTE — Care Management Notes (Addendum)
[facility]  Care Management Note    Patient Name: [name] [name]  Date of Birth: 18-Jan-1986  Sex: female  Date/Time of Admission: 02/05/2021 12:34 AM  Room/Bed: 664/A  Payor: [place] MEDICAID / Plan: [place] MEDICAID / Product Type: Medicaid [facility] /    LOS: 24 days   Primary Care Providers:  Pcp, No (General)    Admitting Diagnosis:  Endocarditis [I38]    Assessment:    03/01/21 1312   Assessment Details   Assessment Type Continued Assessment   Date of Care Management Update 03/01/21   Date of Next DCP Update 03/08/21   Care Management Plan   Discharge Planning Status plan in progress   Projected Discharge Date 03/02/21   Discharge plan discussed with: Patient   CM will evaluate for rehabilitation potential yes   Discharge Needs Assessment   Discharge Facility/Level of Care Needs Home (Patient/Family Member/other)(code 1)   Transportation Available family or friend will provide;car   Referral Information   Admission Type inpatient     Per service the patient may discharge 03-02-2021 home with po abx (OPTIMAL TRIAL), for: 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     PT/OT recs: Home with assist. Family to assist the patient.     Per service (Dr. Alfonzo Feller):  "The Cardiac Surgery team and Dr. Renita Papa initially agreed to follow her INR. However, I spoke with [facility] this morning - they do not follow patients on anticoagulation for tricuspid valve replacement. The patient has now been set up to be followed in [facility] until she is establishes with a PCP instead. She will still need to attend an appointment in [facility] (being scheduled) next week. External INR orders have been faxed to [facility]. The patient should also have a printed copy of the order to take with her as a backup".    The patient states she has transport home upon discharge.     [facility] will continue to follow.     Per LPN Elicia Lamp the  patient has a TCC appt on 03-06-2021.     Discharge Plan:  Home (Patient/Family Member/other) (code 1)    The patient will continue to be evaluated for developing discharge needs.     Case Manager: Olin Hauser, [place] COORDINATOR  Phone: [phone]

## 2021-03-01 NOTE — Telephone Encounter (Addendum)
Anticoagulation Encounter Note:  [facility] through [facility] - Warfarin Enrollment    TCC was contacted by Dr. Hassie Bruce from [facility] team regarding new warfarin start in patient without PCP. TCC appointment 03/06/21. Per provider, patient has agreed to attend TCC appointment and follow monitoring recommendations for warfarin. Accepted warfarin referral for TCC management until patient is established with PCP. Advised physician that if patient does not attend TCC appointment that TCC cannot continue to manage warfarin and will contact them to revert management/coordination of care back to team.    ------------------    TCC to manage until established with PCP (orders under Joy Juskowich until seen in [place])   TCC appointment scheduled for 03/06/21    Indication: bioprosthetic tricuspid valve replacement  INR Goal: 1.8-2.2  New medication or continuation? new   Was this a change in anticoagulant? No  Start date (if known): 02/16/21  Intended treatment duration (if known): 6-12 months post-op (from 02/13/21)    Is patient currently bridging? No    Warfarin tablet size available to patient currently: TBD  Last supply of medication given (date/quantity/refills): TBD    Previous anticoagulant(s) used? unknown    1425 - Attempted to reach patient via listed cell # and hospital bedside phone to review warfarin. Unable to reach. Will try again later.    [place] / Department of Pharmaceutical Services   Antithrombotic Stewardship Program      Met with patient to complete warfarin (Coumadin) education with Patient. Discussed details of Warfarin Patient Education Guide with patient:   - Patient's specific indication for warfarin   - Take warfarin at same time every evening  - What to do if a dose was missed or taken incorrectly  - Patient's specific INR goal  - Informed of expectations for INR monitoring  - Medications interactions and what to do when a medication is started  or stopped  o For pain, avoid NSAIDs if possible, use acetaminophen (speak to PCP about pain options in liver disease)  - Food interactions with items containing higher amounts of vitamin K  o Avoid eating liver and be consistent green leafy vegetables eaten from week to week  o Avoid and/or minimize alcohol intake  - How to handle taking warfarin or extra monitoring with acute illness    - Minor v. major side bleeding and when to seek immediate medical attention    Patient did verbalize good understanding    If concerns for understanding, what needs reinforced:   N/A    The contact information in the patient's chart is up to date (for future communication after discharge):  Working on Optometrist - preferred mobile [phone]; alternate contact friend Leeanne Deed [phone]    Notes or required follow up:   [place]. Eugenia Pancoast, PharmD, [place], [place] Specialist, [facility]  Phone: [phone] / [phone]    [facility] Anticoagulation Enrollment Procedures:  Anticoag Enrollment completed   [x]    Standing INR order entered per protocol External order per hospitalist   Added self to care team  -- [place] Notification for Admission as Yes   [x]    Added Anticoag Team, Tcc [facility] Poc/Utc to care team   [x]    Filled out specialty comments   [] will do later   Patient added to [facility] Anticoagulation Patients warfarin Epic List   [x]    Patient added to TCC  Warfarin Tracking Spreadsheet   [x]    Patient documented as start on [facility] Anticoag monthly warfarin report   [x]    Confirm warfarin is on Epic medication list   [] To be done

## 2021-03-01 NOTE — Progress Notes (Signed)
[facility] PROGRESS NOTE    Name: [name] [name] Bed: 664/A   Age: 35 y.o. 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 On another positive note, the pt reported that she only lives "10 min" away from the hospital where she will have her blood drawn.      REVIEW OF SYSTEMS:  Constitutional: Denies fevers and chills  Gastrointestinal: Denies nausea and vomiting    OBJECTIVE:  I have reviewed the vital signs.   Vital Signs:  Last 24 Hours Last Check   Temp  Avg: 36.7 C (98.1 F)  Min: 36.4 C (97.5 F)  Max: 37 C (98.6 F) Temperature: 36.4 C (97.5 F) (03/01/21 0822)   Pulse  Avg: 82  Min: 76  Max: 89 Heart Rate: 76 (03/01/21 0822)   BP  Min: 113/70  Max: 148/90 BP (Non-Invasive): 113/70 (03/01/21 0822)   Resp  Avg: 16  Min: 16  Max: 16 Respiratory Rate: 16 (03/01/21 0822)    SpO2  Avg: 95 %  Min: 94 %  Max: 96 % SpO2: 94 % (03/01/21 0822)   MAP (Non-Invasive)  Avg: 94.7 mmHG  Min: 85 mmHG  Max: 110 mmHG     Height: 165.1 cm (_0 )   Weight: 67.6 kg (149 lb 0.5 oz) (02/26/21 0654)     FiO2: Oxygen Therapy  SpO2: 94 %  Pulse Ox Frequency: continuous  HR-SpO2: 85 bpm  O2 Delivery Source: None (Room Air)  Flow (L/min): 2  Set PEEP: 5 cmH2O  Oxygen Concentration (%): 21  FiO2: 40 %    I/O:  Date 03/01/21 0800 - 03/02/21 0759   Shift 0800-0759 24 Hour Total   INTAKE   P.O. 720 720     Oral 720 720   I.V. 20 20     Med (IV) Flush Volume 20 20   Shift Total 740 740   OUTPUT    Urine       Urine Occurrence 1 x 1 x   Shift Total     NET 740 740      Emesis:     None  BM:    Date of Last Bowel Movement: 02/28/21  Heme:     N/A    Nutrition/Residuals:  MNT PROTOCOL FOR DIETITIAN  DIETARY ORAL SUPPLEMENTS Oral Supplements with tray: Magic Cup-Orange; LUNCH/DINNER; 1 Each  DIET RENAL (60G PRO,2G NA,2G K)    Blood Sugars:   No results found for this encounter      Physical Exam:  General:  no apparent distress.  Does not appear acutely toxic. VS were reviewed   Eyes: Pupils equal and reactive  bilaterally.  No scleral icterus.  ENT: Mucous membranes moist.  Edentulous.  Neck: Supple.  No cervical lymphadenopathy  Lungs:  clear to diminished breath sounds bilaterally with bibasilar crackles.  Equal lateral chest wall expansion with inhalation  Cardiovascular: Regular rate and rhythm.  No murmur.  Trace bilateral lower  extremity edema  Lines:  Right upper extremity PICC present without surrounding erythema drainage  Abdomen: Non distended, soft, and non-tender to palpation.  Normoactive bowel sounds  Extremities:  No warm, red, swollen joints.  No cyanosis of digits.  Skin: Warm and dry.  Resolving vasculitic rash on lower legs.  Surgical incision on chest wall well approximated without surrounding erythema drainage  Neurologic: CN 2-12 remain grossly intact. Spontaneously moves all 4 limbs.  No tremors.  Psychiatric: Pleasant a affect.  Alert and oriented x3. No SI     Labs:  I have reviewed the lab results.    CBC:  Recent Labs     02/27/21  0544 02/28/21  0545 03/01/21  0532   WBC 8.1 6.4 7.0   HGB 6.9* 7.3* 7.3*   HCT 21.3* 23.2* 22.6*   PLTCNT 265 263 237     Differential:   Recent Labs     02/27/21  0544 02/28/21  0545 03/01/21  0532   PMNS 65 62 67   MONOCYTES _0 BASOPHILS 1  <0.10 2  0.10 1  <0.10   PMNABS 5.21 3.99 4.75   LYMPHSABS 2.00 1.61 1.59   MONOSABS 0.66 0.55 0.54   EOSABS <0.10 <0.10 <0.10     BMP:  Recent Labs     02/27/21  0544 02/28/21  0545 03/01/21  0532   SODIUM  138 139 135*   POTASSIUM 3.9 3.8 3.8   CHLORIDE 109 110 106   CO2 17* 19* 18*   BUN 58* 57* 60*   CREATININE 1.85* 1.70* 1.74*   GLUCOSENF 102 88 118   ANIONGAP _1 GFR 36* 40* 39*   CALCIUM 8.5 8.3* 7.8*   MAGNESIUM 2.0 2.0 1.9   PHOSPHORUS 6.5* 7.3* 6.9*     RBC Indices:  Recent Labs     02/27/21  0544 02/28/21  0545 03/01/21  0532   MCV 83.5 85.3 84.6   MCH 26.7 26.8 27.3   MCHC 31.9 31.5 32.3   MPV 8.1* 8.1* 8.2*     Coagulation Studies:  Recent Labs     02/27/21  0545 02/28/21  0545 03/01/21  0532   INR 1.45* 1.47* 1.42*   PROTHROMTME 16.8* 17.0* 16.5*     Liver/Pancreas:  Recent Labs     02/27/21  0544   TOTALPROTEIN 7.1   ALBUMIN 2.8*   AST 19   ALT 10   ALKPHOS 112*   BILIRUBINCON 0.4   TOTBILIRUBIN 0.7     Inflammatory Markers:  Recent Labs     02/27/21  0544 03/01/21  0532   CREAPROINFLA 52.2* 37.5*     Antimicrobial Markers:  Recent Labs     03/01/21  0532   CPK 10*       Radiology:   I have reviewed the images.      EKG:   I have reviewed the tracings.   Recent Results (from the past 720 hour(s))   ECG 12-LEAD    Collection Time: 02/05/21  1:00 AM   Result Value    Ventricular rate 99    Atrial Rate 99    PR Interval 136    QRS Duration 86    QT Interval 338    QTC Calculation 433    Calculated P Axis 41    Calculated R Axis 13    Calculated T Axis 21    Narrative    Normal sinus rhythm  Normal  ECG  No previous ECGs available  Preliminary by fellow Harlow Asa, MD, [place] [phone]) on 02/05/2021 1:22:59 PM  Confirmed by Pasty Spillers MD, ANTHONY ([id number]) on 02/05/2021 7:19:09 PM     Microbiology:   I have reviewed the microbiology.  Hospital Encounter on 02/05/21 (from the past 96 hour(s))   ADULT ROUTINE BLOOD CULTURE, SET OF 2 ADULT BOTTLES (BACTERIA AND YEAST)    Collection Time: 02/26/21  3:23 AM    Specimen: Blood   Culture Result Status    BLOOD CULTURE, ROUTINE No Growth 3 Days Preliminary   ADULT ROUTINE BLOOD CULTURE, SET OF 2 ADULT BOTTLES (BACTERIA AND YEAST)    Collection Time: 02/26/21 10:41 AM     Specimen: Blood   Culture Result Status    BLOOD CULTURE, ROUTINE No Growth 3 Days Preliminary     Pathology:   I have reviewed the pathology.   Results for orders placed or performed during the hospital encounter of 02/05/21   SURGICAL PATHOLOGY SPECIMEN   Result Value    Final Diagnosis      A.  SKIN, RIGHT LOWER EXTREMITY, PUNCH BIOPSY:  - Leukocytoclastic vasculitis.  (See Comment.)    B.  SKIN, RIGHT LOWER EXTREMITY, PUNCH BIOPSY FOR DIRECT IMMUNOFLUORESCENCE:   - Deposits of IgM, C3 and fibrinogen in a perivascular pattern in the upper dermis.   - No specific deposits of IgG and IgA identified on cryostat examined sections.      Diagnosis Comment      A.  The specimen shows perivascular infiltrate of neutrophils with fragmented nuclear debris (leukocytoclasis). The neutrophils are extending into the epidermis with neutrophilic exocytosis. There is extravasation of erythrocytes in the dermis and fibrinoid necrosis of blood vessels. PAS fungal stain is negative for fungal microorganisms. 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 Clinical correlation is recommended.    B.  The immunofluorescent features are consistent with leukocytoclastic vasculitis.   (The immunoreactant and special stain controls were appropriately reactive       Microscopic Description      Performed.      Gross Description      A. Skin.  Part A is received in formalin labeled "[place], Soderberg" and "right lower extremtiy". It consists of a 0.4 x 0.4 cm circular piece of tan skin excised to a depth of 0.4 cm.  The specimen is bisected and submitted entirely in cassette A1.    B. Skin.  Part B is received in [employer] labeled "Euretha, Najarro" and "right lower extremtiy". It consists of a 0.4 x 0.4 cm circular piece of tan skin excised to a depth of 0.4 cm.  The specimen is submitted entirely for immunofluorescence studies.     SURGICAL PATHOLOGY SPECIMEN   Result Value    Final Diagnosis      Tricuspid valve,  resection:  . Valve tissue with fibrosis and adherent vegetations containing numerous colonies of bacterial organisms, consistent with infective endocarditis      Microscopic Description      Performed.      Clinical History      . Tricuspid regurgitation - severe  . MRSA Bacteremia  . IV Drug Abuse  . Septic Emboli      Gross Description      A. Heart.  Part A is received in formalin, labeled "Kahealani, Yankovich" and "tricuspid valve". It consists of a 2.5 x 2.0 x 0.5 cm aggregate of tan-pink hemorrhagic shaggy pliable valve tissue with attached red-brown vegetations.  The specimen  is submitted in toto in cassette A1.      Addendum Report      Special stains for microorganisms (Gram and GMS) were performed, with appropriate controls.  These confirm numerous colonies of predominantly Gram positive coccal organisms.  GMS stain further highlights the nonviable forms.       PT/OT: Yes    Consults:   IP CONSULT TO NEPHROLOGY - ORDERING PROVIDER MUST CALL/PAGE SERVICE  IP CONSULT TO INFECTIOUS DISEASES - ORDERING PROVIDER MUST CALL/PAGE SERVICE  IP CONSULT TO NUTRITION SERVICES  IP CONSULT TO CARE MANAGEMENT  SUICIDE RISK ASSESSMENT - CONSULT (IP ONLY)  IP CONSULT TO DERMATOLOGY - ORDERING PROVIDER MUST CALL/PAGE SERVICE  IP CONSULT TO PSYCHIATRY - SUBSTANCE ABUSE DISORDER - ORDERING PROVIDER MUST CALL/PAGE SERVICE     Current Inpatient Medications: acetaminophen (TYLENOL) tablet, 650 mg, Oral, Q4H PRN  aspirin chewable tablet 81 mg, 81 mg, Oral, Daily  B complex-vitamin C-folic acid (NEPHROCAPS) capsule, 1 Capsule, Oral, Daily  bisacodyl (DULCOLAX) rectal suppository, 10 mg, Rectal, Daily PRN  buprenorphine-naloxone (SUBOXONE) 2-0.31m per sublingual film, 2 Film, Sublingual, 2x/day  calcium carbonate (TUMS) 5012m(20067mlemental calcium) chewable tablet, 500 mg, Oral, 3x/day PRN  famotidine (PEPCID) tablet, 20 mg, Oral, Daily  hydrALAZINE (APRESOLINE) injection 10 mg, 10 mg, Intravenous, Q4H PRN  hydrOXYzine pamoate  (VISTARIL) capsule, 50 mg, Oral, 4x/day PRN  ipratropium (ATROVENT) 0.02% nebulizer solution, 0.5 mg, Nebulization, Q4H PRN  lidocaine-menthol (LIDOPATCH) 3.6%-1.25% patch, 1 Patch, Transdermal, Daily  lidocaine-menthol (LIDOPATCH) 3.6%-1.25% patch, 1 Patch, Transdermal, Daily  lidocaine-menthol (LIDOPATCH) 3.6%-1.25% patch, 1 Patch, Transdermal, Daily  [START ON 03/02/2021] linezolid (ZYVOX) tablet, 600 mg, Oral, 2x/day  magnesium hydroxide (MILK OF MAGNESIA) 400m29mr 5mL 44ml liquid, 30 mL, Oral, Daily PRN  melatonin tablet, 6 mg, Oral, NIGHTLY  metoprolol tartrate (LOPRESSOR) tablet, 25 mg, Oral, 2x/day  miconazole nitrate (SECURA) 2% topical cream, , Apply Topically, 2x/day  nicotine polacrilex (COMMIT) lozenge, 2 mg, Oral, Q2H PRN  NS flush syringe, 10-30 mL, Intracatheter, Q8HRS  NS flush syringe, 20-30 mL, Intracatheter, Q1 MIN PRN  ondansetron (ZOFRAN) 2 mg/mL injection, 4 mg, Intravenous, Q8H PRN  perflutren lipid microspheres (DEFINITY) 1.3 mL in NS 10 mL (tot vol) injection, 2 mL, Intravenous, Give in Cardiology  polyethylene glycol (MIRALAX) oral packet, 34 g, Oral, Daily  QUEtiapine (SEROQUEL) tablet, 50 mg, Oral, NIGHTLY  rifAMPin (RIFADIN) capsule, 300 mg, Oral, 2x/day  sennosides-docusate sodium (SENOKOT-S) 8.6-50mg per tablet, 2 Tablet, Oral, 2x/day  sevelamer carbonate (RENVELA) oral powder, 2,400 mg, Oral, 3x/day-Meals  sodium bicarbonate tablet, 1,300 mg, Oral, 3x/day  sodium chloride 3% nebulizer solution, 3 mL, Nebulization, Q6H PRN  sodium citrate 4% (3 mL) injection syringe, 2 Syringe, Intracatheter, Q1H PRN  triamcinolone acetonide (ARISTOCORT A) 0.1% topical ointment, , Apply Topically, 2x/day  Warfarin Placeholder, , Does not apply, Daily PRN    Allergies:  No Known Allergies    ASSESSMENT/PLAN:    Inpatient Summary: 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  [facility] with drug overdose, and she was admitted there 02/01/2021-02/04/2021 prior to being transferred  to our facility.  She was intubated for airway protection.  She had progression of acute kidney injury to anuric acute renal failure requiring dialysis through a temporary dialysis catheter.  She developed anemia requiring blood transfusion and thrombocytopenia.  Blood cultures 02/01/2021-02/04/2021 grew MRSA (vancomycin MIC 2, rifampin MIC 1) with clearance of bacteremia not achieved at that time.  Chest radiograph on 01/31/2021 showed right middle lobe patchy opacity concerning for aspiration, pulmonary vascular congestion, and cardiomegaly.  CT brain without contrast on 01/31/2021 showed no acute intracranial abnormality.  Renal ultrasound on 02/02/2021 was unremarkable.  Transthoracic echocardiogram on 02/03/2021 showed left ventricular ejection fraction 55-60%, large tricuspid vegetation 1.91 cm X 1.67 cm with moderate tricuspid regurgitation, and trivial mitral regurgitation.  Repeat CT brain without contrast on 02/04/2021 showed no acute intracranial abnormality.  CT chest without contrast on 02/04/2021 showed multiple bilateral pulmonary nodules (solid, groundglass, and cavitary) predominantly located peripherally consistent with septic pulmonary emboli, small bilateral pleural effusion, and marked splenomegaly.  The patient received ceftriaxone on 01/31/2021, vancomycin 02/02/2021-02/04/2021, piperacillin-tazobactam 02/01/2021-02/02/2021, and cefepime and metronidazole  02/02/2021-02/04/2021.  Following arrival at our facility, blood cultures 02/05/2021-02/15/2021 grew MRSA (vancomycin MIC 1, daptomycin MIC 0.5, ceftaroline susceptible, rifampin MIC <=0.5) with clearance of bacteremia ultimately achieved postoperatively on 02/16/2021.  BAL cultures on 02/05/2021 and 02/13/2021 also grew MRSA.  Transthoracic echocardiogram on 02/05/2021 showed left ventricular ejection fraction 62.6%, tricuspid vegetation 1.4 cm X 0.8 cm with mild tricuspid regurgitation, and normal appearing mitral valve.  MRI brain without contrast on 02/06/2021 showed no acute intracranial abnormality.  MRI's cervical, thoracic, and lumbosacral spine without contrast on 02/10/2021 showed no evidence of infection.  Repeat transthoracic echocardiogram on 02/11/2021 showed tricuspid vegetation with severe tricuspid regurgitation.  The patient underwent extraction of remaining teeth on 02/09/2021.  She underwent bioprosthetic tricuspid valve replacement with primary closure of patient foramen ovale on 02/13/2021 when operative culture grew MRSA.  Surgical pathology showed valve tissue with fibrosis and adherent vegetations containing numerous colonies of bacterial organisms (predominantly gram positive cocci with nonviable forms) consistent with endocarditis.  She had her last dialysis session on 02/14/2021 with removal of her most recent left femoral temporary dialysis catheter at that time.  She developed a rash around 02/15/2021 that raised concern for vancomycin-induced rash.  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  Chest radiographs 02/17/2021-02/23/2021 showed bilateral pleural effusions with partially loculated right pleural effusion reported as of 02/22/2021.  The patient underwent right chest tube insertion on 02/23/2021 with immediate drainage of approximately 1 liter of bloody fluid consistent with hemothorax and subsequent removal on 02/25/2021.  Right pleural  fluid culture on 02/23/2021 had no growth with no organisms on Gram stain.  The patient received vancomycin 02/06/2021-02/14/2021 prior to being transitioned to daptomycin (02/17/2021-present) and rifampin (02/16/2021-present).  Plan is for a total 6-week antimicrobial course dated from negative blood cultures (end date 03/29/2021).  As clearance of bacteremia was not achieved preoperatively and operative cultures were positive, will follow the initial 6-weeks of treatment with doxycycline oral suppression for 6 months.  Pre-enrollment testing for the OPTIMAL Trial performed on 02/26/2021 included blood cultures X 2 sets that are negative; CT chest, abdomen, and pelvis without contrast that showed postoperative changes from tricuspid valve repair with loculated fluid along right heart border (similar appearance to right hemothorax prior to chest tube drainage; felt to be postoperative fluid/small amount of bloody fluid), redemonstration of numerous septic emboli in bilateral lungs with improving groundglass and intralobular  septal thickening, mild interval worsening of small right pleural effusion and right basilar consolidation with trace hydropneumothorax on right, and marked hepatosplenomegaly; and transthoracic echocardiogram that showed left ventricular ejection fraction 61.2% and tricuspid bioprosthesis with normal leaflet motion and no paravalvular tricuspid regurgitation.  Our Psychiatry/Substance Use Treatment evaluated the patient and assisted with transition to Suboxone on 02/27/2021.  The patient consented for enrollment in the [facility] and has been randomized to the oral antimicrobial arm (beginning 03/02/2021).  Her antimicrobial regimen will change to Linezolid 600 mg PO BID and rifampin 300 mg PO BID for the last 4 weeks (03/02/2021-03/29/2021) of a total 6-week antimicrobial treatment course prior to transitioning to doxycycline oral suppression (as of 03/30/2021) for 6 months.    Patient requires HIGH  RISK DECISION MAKING due to the risk of complications, significant morbidity and/or mortality associated with the necessary therapy, specifically drug therapy, that requires intensive monitoring for toxicity resulting from the narrow therapeutic index of medications the patient is currently being administered, including but not limited to: antimicrobials such as Vancomycin, Daptomycin, and Gentamicin; new Warfarin/coumadin for anticoagulation for new valve replacement; opioids for pain management in a patient with a history of substance abuse;  and/or medical alternative therapy such as Suboxone for history of substance abuse.     Additionally, this patient may require HIGH RISK DECISION MAKING for a chronic illness which poses a threat to life or bodily function, specifically psychiatric illness of addiction/substance misuse (if listed below) with a potential threat to self, as this condition directly lead to the patient's life-threatening infection and current need for hospitalization.     Does the patient have a history of an underlying substance use disorder? 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 Renita Papa  - Microbiology:   [facility]:   - 02/01/2021 Blood cultures X 2 pediatric sets: MRSA (vancomycin MIC 2, rifampin MIC <1, tetracycline MIC <4) in 2 of 2 bottles   - 02/02/2021 Blood cultures X 2 sets: MRSA in 2 of 4 bottles (both sets)   - 02/02/2021 Sputum culture: MRSA as well as yeast and usual flora   - 02/04/2021 Blood cultures X 2 sets: MRSA in 2 of 4 bottles (both sets)   [facility]:   - 02/05/2021 BAL culture: 15,000 MRSA   - 02/05/2021 Blood cultures X 2 sets: MRSA (vancomycin MIC 1, daptomycin MIC 0.5, ceftaroline susceptible, rifampin MIC <=0.5, Linezolid MIC 2, tetracycline MIC <=1, doxycycline MIC <=0.5) in 2 of 2 sets   - 02/07/2021 Blood cultures X 2 sets: MRSA in 2 of 2 sets   - 02/09/2021 Blood cultures X 2 sets: MRSA in 2 of 2 sets   - 02/11/2021 Blood cultures X 2 sets: MRSA in 2 of 2 sets   - 02/13/2021 Blood cultures X 2 sets: MRSA in 2 of 2 sets   - 02/13/2021 Operative culture from tricuspid valve tissue: MRSA   - 02/13/2021 BAL culture: MRSA   - 02/14/2021 Blood cultures X 2 sets: MRSA in 2 of 2 sets   - 02/15/2021 Blood cultures X 2 sets: MRSA in 2 of 2 sets   - 02/16/2021 Blood cultures  X 2 sets: No growth (clearance of bacteremia achieved)   - 02/23/2021 Right pleural fluid culture: No growth   - 02/25/2021 Blood cultures X 2 sets: Pending  - Pathology: 02/13/2021 Surgical pathology: Valve tissue with fibrosis and adherent vegetations containing numerous colonies of bacterial organisms (predominantly gram positive cocci with nonviable forms) consistent with endocarditis  - Antimicrobial Therapy: Daptomycin 8 mg/kg IV Q24 hours + rifampin 300 mg PO BID  - Duration of antimicrobial therapy and end date: 6 weeks (2 weeks IV + 4 weeks PO) dated from negative blood cultures (02/16/2021 = day 1); end date 03/29/2021   - Transition to linezolid 600 mg PO BID and rifampin 300 mg PO BID for the last 4 weeks (03/02/2021-03/29/2021) of a total 6-week antimicrobial treatment course; patient's Medicaid termed - linezolid and rifampin able to be provided at [employer] [facility] [employer] $24.46 total, which patient is able to afford (and patient will contact [facility] to restart her Medicaid); counseling regarding both antimicrobials was provided to the patient   - Following completion of the above regimen, transitioning to doxycycline oral suppression (as of 03/30/2021) for 6 months - will prescribe at time of  follow-up in Infectious Diseases Clinic unless also able to get at [employer] [facility] on discharge  - Location of central access and date placed: PICC placed in right brachial vein on 02/21/2021 - remove prior to discharge  - Labs: Minimum of twice weekly CBC with differential, BMP, magnesium, hepatic function panel, and CRP as well as CK weekly while inpatient; weekly CBC with differential, BUN, creatinine, total bilirubin, AST, ALT, alkaline phosphatase, and CRP after discharge (external orders placed)   - 9/15: HGB remains < 7.5, Cr remains > 1.65 (1.74), and CRP is down to 37.5 (was 52.2)   - Additional medications: Aspirin 81 mg PO daily, bumetanide 1 mg IV daily, metoprolol tartrate 25 mg PO BID    Anticoagulation:  - Indication: Tricuspid valve replacement  - Medication/Therapy: Warfarin  - Current dose:  Maintain at 2 mg q.h.s.  - Anticoagulation needed at discharge: Yes    - Will require 3 months postoperatively unless otherwise specified by Dr. Renita Papa  - Goal INR: 2  - Last INR:  1.42  - Outpatient INR Monitoring: Cardiac Surgery team will discuss INR monitoring with Dr. Renita Papa on 02/28/2021 assuming anticoagulation is continued; will need close monitoring of INR once rifampin is completed as warfarin dose will likely need decreased at that time    Pain Control:   - Current Regimen:   - Acetaminophen 650 mg PO Q4 hours prn   - Lidocaine patch 3 TD daily  - Bowel regimen: Senokot-S 2 tabs PO BID, Miralax 34 g daily    History of Substance Misuse:  - Diagnosis: Opioid use disorder, Stimulant use disorder, Alcohol use disorder in remission, Sedative-hypnotic use disorder in remission  - Substance(s) Used: Heroin, methamphetamine, prior alcohol, prior gabapentin  - Medical/Withdrawal Therapy: N/A  - Evaluation by Psychiatry: Yes  - Agreeable to individual therapy: TBD  - Agreeable to group therapy: TBD  - Agreeable to outpatient therapy: Yes  - Agreeable to medical alternative therapy: Yes - transitioned  to Suboxone on 02/27/2021  - Agreeable to [facility]U: N/A  - Agreeable to [facility]: TBD  - Agreeable to [place]: TBD  - Risk to discharge with PICC: High    Screening:  - UDS (on admission): Not done  - UDS (most recent): 02/27/2021 Positive for buprenorphine and oxycodone (had just  been given both while inpatient)  - Syphilis Screening: Negative  - Urine Gonorrhea/Chlamydia: Previously ordered  - Tdap: Discuss before discharge  - SARS-CoV-2 (COVID-19) Vaccination: Discuss before discharge  - SARS-CoV-2 (COVID-19) Screening: 02/27/2021 negative    Cleared hepatitis C   - Antibody Positive Date: 02/05/2021  - Quantitative RNA PCR Value: 02/05/2021 Target not detected  - HIV Screen: Negative  - Hep A IgG: Reactive (immune)  - Hep B Surface Ag: Negative  - Hep B Surface Ab: 497  - Hep B Core Ab: Negative    Right hemothorax - improved  - Status post right chest tube insertion on 02/23/2021 and removal on 02/25/2021    Idiopathic postoperative anemia - still present  - Most recently transfused PRBCs on 02/21/2021, 02/24/2021, and 02/27/2021  - Follow hemoglobin and hematocrit; transfuse additional PRBCs for hemoglobin <7  - [facility] to follow INR s/p DC per updates from Dr. Alfonzo Feller on 03/01/21    Acute kidney injury on possible CKD 3 - improving  - Nephrology following  - Decrease bumetanide to 1 mg IV Q24 hours; will need to finalize recommendations regarding an oral diuretic regimen prior to discharge  - Continue sodium bicarbonate 650 mg PO TID  - Continue Nephrocaps 1 capsule PO daily  - Monitor BMP, magnesium, and phosphorus and I/Os    Leukocytoclastic vasculitis - improved  - Confirmed by punch biopsy pathology on 02/16/2021  - Continue triamcinolone 0.1% topical ointment BID for now    Insomnia - improved  - Continue melatonin 5 mg PO nightly      Chronic Issues:   Unspecified anxiety and depressive disorders - improved  - Continue quetiapine 100 mg PO nightly    Tobacco use disorder -  stable  - Counsel regarding smoking cessation this admission  - Continue nicotine replacement therapy      Resolved Issues:   Dialysis requirement        DVT/PE Prophylaxis/Anticoagulation: Warfarin    Disposition: Home with OPTIMAL Trial Oral Antimicrobial Arm/COpAT on 03/02/2021    Follow Up Appointments:  PCP/TCC: Set up a PCP in [place], [place], on discharge; follow-up in TCC 1 week after discharge if unable to establish with PCP that soon  Infectious Diseases: Telephone follow-up with Dr. Alfonzo Feller in [facility] on 03/15/2021 at 11:00 AM (being scheduled) as well as in-person follow-up 1 month after discharge on same day as appointment with Cardiac Surgery (will schedule before discharge)  Cardiac Surgery: Follow up with Dr. Renita Papa 1 month after discharge (will schedule before discharge)  Psychiatry: Follow up in [facility] (preferred if feasible) or [place] with release of information to obtain record of appointment attendance while completing treatment phase with oral antimicrobial therapy  Nephrology: TO PLACE F/U ORDERS PRIOR TO [place] ON Friday 03/01/21      I independently of the faculty provider spent a total of 23 minutes in direct/indirect care of this patient including initial evaluation, review of laboratory, radiology, diagnostic studies, review of medical record, order entry and coordination of care.     Debria Garret [place], [place]  03/01/2021, 15:56    This is a shared encounter with the physician assistant.  I saw and evaluated the patient.  I rounded and discussed the case at the patient's bedside with the the physician assistant, which is time attributed to the physician assistant.  I reviewed the corresponding note and agree with the findings and plan of care as edited/noted.     My  clinical impression/findings:  Patient continues to look clinically well.  Surgical incision is well healing.  PICC line intact without surrounding erythema or drainage.  Patient  looking for discharge tomorrow.  Laboratory blood work shows continued elevation of her creatinine clearance, however patient reports she has baseline kidney dysfunction secondary to large kidney stone since she was 35 years old.  It is likely the patient is currently at her baseline renal function.  Continue current dose of Suboxone.  Continue current dose of antimicrobial therapy.  Intended discharge tomorrow.    My independent time spent on this patient including:   . Chart Review   . Counseling or educating the patient, family or caregiver  . Ordering medications, tests or procedures  . Care coordination   . Referring the patient to and communicating with other health care professionals   . Face to Face Time  . Charting Time  Physician Time:  25 minutes  Total Time:  48 minutes    Wilbern Pennypacker Maebelle Munroe MD, PhD 03/01/2021, 18:47  Assistant Professor,   [facility] and Infectious Disease  Department of [place] of Medicine.

## 2021-03-01 NOTE — Ancillary Notes (Signed)
Patient remained in room during shift. Patient was cooperative with staff.    [name] [name]  03/01/2021, 18:58

## 2021-03-01 NOTE — Care Management Notes (Addendum)
Per service (Dr. Alfonzo Feller):  "The Cardiac Surgery team and Dr. Renita Papa initially agreed to follow her INR. However, I spoke with [facility] this morning - they do not follow patients on anticoagulation for tricuspid valve replacement. The patient has now been set up to be followed in [facility] until she is establishes with a PCP instead. She will still need to attend an appointment in TCC (being scheduled) next week. External INR orders have been faxed to [facility] The patient should also have a printed copy of the order to take with her as a backup".

## 2021-03-01 NOTE — Discharge Instructions (Addendum)
If there are any other questions you have, or if a medical problem should develop, please call [phone]). Our transitions nurses and social worker are available to assist you Monday thru Friday from 7am-3pm. These nurses can help you with questions regarding your discharge instructions. If this is after 3pm, a weekend, or holiday please call and ask to speak to the doctor on call for medicine.  In case of an emergency call 911.    PSYCHIATRY FOLLOW UP  Intake appointment on Tuesday 9/20 at 9:00am at [facility]  [place], [place] [address]  Phone: [phone]

## 2021-03-01 NOTE — Progress Notes (Addendum)
Brief Med Hospitalist Service 7 Note    Updated outpatient anticoagulation plan/INR monitoring is as follows:    Ms. 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    Dr. Renita Papa agreed to be the physician following the patient's INR.  However, I was notified that [facility] does not routinely follow patients on anticoagulation for history of tricuspid valve replacement.    [facility] agreed to follow the patient's INR until she establishes with a primary care provider in [place], [place], who will then take over her INR monitoring.    The patient has been enrolled in [facility].  External standing orders for PT/INR daily prn have been entered and faxed to the outpatient lab at [facility].    It is important for the patient to attend her telephone appointment in [facility] that is currently being scheduled for next week.  We have started the process of setting her up with a primary care provider in [place], [place], with the request for an appointment to be scheduled after that time.    Evangeline Dakin, M.D., 03/01/2021  Assistant Professor, Sections of Infectious Diseases and [place] Department of Medicine

## 2021-03-01 NOTE — Consults (Signed)
[facility]  Psychiatry Consults- Subsequent Encounter    [name] [name]  1985/08/25  [id number]    Date of service: 03/01/21      CHIEF COMPLAINT/REASON FOR CONSULT: substance misuse    ASSESSMENT:  [name] [name] is a 35 y.o. female with past history IV drug use who presented to [facility] with altered mental status on 08/17.  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  Urine drug screen at outside facility was positive for heroin and methamphetamines.  Psychiatry consulted for substance use management.  Patient denies any symptoms of withdrawal.  Appears to be in the preparation stage of commitment for sobriety.  Identified barriers for sobriety include cravings and being back in her hometown.  Appears to have underlying anxiety and depressive features that contribute to continued use.  Very interested and motivated for sobriety. Appears to have unknown substance and syringe found in her belongings upon transfer to [place]. Patient reports that it was bath salts to soak her feet, denies illicit drug use in house. urine drug screen ordered on 9/13 and was positive for buprenorphine and oxycodone. Patient is a candidate for OPTIMAL trial and may be discharged as soon as tomorrow. Started on Suboxone yesterday, reports doing well, denies cravings.     Ideally, would like to see patient have more of a chance to engage in addiction resources on 6E such as therapy, group therapy, and continued evaluation by this provided prior to release. Patient has significant barriers to sobriety which may affect her overall compliance with PO antibiotics- posing as potential health risks in the future. Questionable motivation for sobriety as unknown substance and syringe was found with her upon transfer to [place] from  [place].  Recommend thorough discussion on the importance of compliance if patient would be released tomorrow on 4 week course of antibiotics.     Safety Screening:  A safety assessment was completed. Risk factors include: anxiety, prior trauma, history of impulsivity and substance misuse. At this time, the patient does not pose an acute risk above baseline. Protective factors include: no thoughts of suicide, no thoughts of self-harm, no thoughts of harm toward others, no prior suicide attempts, no prior self-injury and demonstration of help-seeking behavior.    DIAGNOSES: opioid use disorder, stimulant use disorder, tobacco use disorder, alcohol use disorder in remission, sedative-hypnotic use disorder (gabapentin) in remission, unspecified anxiety disorder (SIAD vs GAD), unspecified depressive disorder (SIDD vs MDD)    Medical comorbidities: MRSA bacteremia, TV endocarditis      PLAN:  1. Antibiotic end date: 10/13  2. Please place psychiatry consult order if not already done.  3. Precautions: No indication for 1:1 supervision.  4. substance use management   a. Has been using heroin and meth IV for the past 3 years  b. Continue Suboxone tomorrow 4-29m SL film BID   c. Provide naloxone kit at discharge : naloxone education and training provided to patient to decrease overdose risk.  d.  MAT Induction: patient desires medication assisted treatment with Suboxone, discussed long-term treatment, and understands the benefits and risks.   5. Unspecified anxiety / depression   a. Patient started on seroquel for agitation in the ICU, not first line medication for anxiety or depression however can continue while SSRI is being established  1. Continue seroquel 50 mg PO QHS for sleep and anxiety until Lexapro  can be started on the OP setting  b. Patient was not started on Lexapro per last recommendations, d/t interactions with Linezolid (already started) will not start, discussed f/u with OP psych to initiate upon end of  antibiotics  c. Continue vistaril 50 mg PO QID for acute anxiety  7. Disposition: will obtain f/u in [place] area for psych and Suboxone -likely will be at [place]:   Patient seen resting in bedside chair. Reports feeling well on Suboxone, denies cravings. Sleeping well. Discussed what AS found in her belongings upon transfer to [place]. States that they were "bath salts" for her feet, she had been soaking in the pink basin (noted to be on the floor). Denies use of substances while in house or desire to use. States that she only used meth when it was around and "it was by no means my drug of choice." Denies current cravings or withdrawal symptoms. Patient states that she has cut ties with her last boyfriend. Will be living in a stable environment. Does not have reliable internet for telepsych appointment, thus not a candidate for f/u at [facility]. Amenable to f/u at [facility].       Current Medications: Reviewed in Epic.  Allergies: No Known Allergies      PHYSICAL EXAM:  Constitutional: BP 113/70   Pulse 76   Temp 36.4 C (97.5 F)   Resp 16   Ht 1.651 m (5' 5" )   Wt 67.6 kg (149 lb 0.5 oz)   SpO2 94%   BMI 24.80 kg/m   Eyes: Pupils equal, round. EOM grossly intact. No nystagmus. Conjunctiva clear.  Respiratory: Regular rate. No increased work of breathing. No use of accessory muscles.  Cardiovascular: No swelling/edema of exposed extremities.  Musculoskeletal: Gait/station as below. Moving all 4 extremities. No observed joint swelling.  Heme/Lymph: Neck supple without adenopathy.  Neuro: Alert, oriented to person, place, time, situation. No abnormal movements noted. No tremor.  Psych: Below.  Skin: Dry. No diaphoresis or flushing. No noticeable erythema, abrasions, or lesions on exposed skin.    Mental Status Exam:  Appearance: appears older than stated age  Behavior: calm and cooperative  Gait/Station: reclined in bedside chair  Musculoskeletal: No psychomotor agitation or  retardation noted  Speech: regular rate and regular volume  Mood: "good"  Affect: congruent with mood  Thought Process: linear  Associations:  no loosening of associations  Thought Content: no thoughts of self-harm, no thoughts of suicide and no intent or plan for suicide  Perceptual Disturbances: no AVH, no auditory hallucinations and no visual hallucinations  Attention/Concentration: grossly intact  Orientation: grossly oriented  Memory: recent and remote memory intact per interview  Language: no word-finding issues  Insight: fair  Judgment: fair      PERTINENT STUDIES/TESTING:  UDS : per HPI urine drug screen at outside facility was + heroin and methamphetamines on admission  EtOH: not obtained  EKG: QTC 433 ms  LABS:   Results for orders placed or performed during the hospital encounter of 02/05/21 (from the past 24 hour(s))   C-REACTIVE PROTEIN(CRP),INFLAMMATION   Result Value Ref Range    CRP INFLAMMATION 37.5 (H) <8.0 mg/L   CREATINE KINASE (CK), TOTAL, SERUM   Result Value Ref Range    CREATINE KINASE 10 (L) 25 - 190 U/L   CBC/DIFF    Narrative    The following orders were created for panel order CBC/DIFF.  Procedure  Abnormality         Status                     ---------                               -----------         ------                     CBC WITH HQPR[[id number]]                Abnormal            Final result                 Please view results for these tests on the individual orders.   BASIC METABOLIC PANEL   Result Value Ref Range    SODIUM 135 (L) 136 - 145 mmol/L    POTASSIUM 3.8 3.5 - 5.1 mmol/L    CHLORIDE 106 96 - 111 mmol/L    CO2 TOTAL 18 (L) 22 - 30 mmol/L    ANION GAP 11 4 - 13 mmol/L    CALCIUM 7.8 (L) 8.5 - 10.0 mg/dL    GLUCOSE 118 65 - 125 mg/dL    BUN 60 (H) 8 - 25 mg/dL    CREATININE 1.74 (H) 0.60 - 1.05 mg/dL    BUN/CREA RATIO 34 (H) 6 - 22    ESTIMATED GFR 39 (L) >=60 mL/min/BSA   MAGNESIUM   Result Value Ref Range    MAGNESIUM 1.9 1.8 - 2.6 mg/dL    PHOSPHORUS   Result Value Ref Range    PHOSPHORUS 6.9 (H) 2.4 - 4.7 mg/dL   PT/INR   Result Value Ref Range    PROTHROMBIN TIME 16.5 (H) 9.1 - 13.9 seconds    INR 1.42 (H) 0.80 - 1.20    Narrative    Coumadin therapy INR range for Conventional Anticoagulation is 2.0 to 3.0 and for Intensive Anticoagulation 2.5 to 3.5.   CBC WITH DIFF   Result Value Ref Range    WBC 7.0 3.7 - 11.0 x10^3/uL    RBC 2.67 (L) 3.85 - 5.22 x10^6/uL    HGB 7.3 (L) 11.5 - 16.0 g/dL    HCT 22.6 (L) 34.8 - 46.0 %    MCV 84.6 78.0 - 100.0 fL    MCH 27.3 26.0 - 32.0 pg    MCHC 32.3 31.0 - 35.5 g/dL    RDW-CV 17.7 (H) 11.5 - 15.5 %    PLATELETS 237 150 - 400 x10^3/uL    MPV 8.2 (L) 8.7 - 12.5 fL    NEUTROPHIL % 67 %    LYMPHOCYTE % 23 %    MONOCYTE % 8 %    EOSINOPHIL % 0 %    BASOPHIL % 1 %    NEUTROPHIL # 4.75 1.50 - 7.70 x10^3/uL    LYMPHOCYTE # 1.59 1.00 - 4.80 x10^3/uL    MONOCYTE # 0.54 0.20 - 1.10 x10^3/uL    EOSINOPHIL # <0.10 <=0.50 x10^3/uL    BASOPHIL # <0.10 <=0.20 x10^3/uL    IMMATURE GRANULOCYTE % 1 0 - 1 %    IMMATURE GRANULOCYTE # <0.10 <0.10 x10^3/uL     Derry Lory, APRN,NP-C  03/01/2021, 12:48

## 2021-03-02 ENCOUNTER — Other Ambulatory Visit (INDEPENDENT_AMBULATORY_CARE_PROVIDER_SITE_OTHER): Payer: Self-pay | Admitting: Family

## 2021-03-02 ENCOUNTER — Other Ambulatory Visit: Payer: Self-pay

## 2021-03-02 ENCOUNTER — Telehealth (HOSPITAL_BASED_OUTPATIENT_CLINIC_OR_DEPARTMENT_OTHER): Payer: Self-pay | Admitting: Family

## 2021-03-02 DIAGNOSIS — Z720 Tobacco use: Secondary | ICD-10-CM

## 2021-03-02 DIAGNOSIS — J942 Hemothorax: Secondary | ICD-10-CM

## 2021-03-02 DIAGNOSIS — Z7901 Long term (current) use of anticoagulants: Secondary | ICD-10-CM

## 2021-03-02 DIAGNOSIS — Z029 Encounter for administrative examinations, unspecified: Secondary | ICD-10-CM

## 2021-03-02 DIAGNOSIS — D649 Anemia, unspecified: Secondary | ICD-10-CM

## 2021-03-02 DIAGNOSIS — N179 Acute kidney failure, unspecified: Secondary | ICD-10-CM

## 2021-03-02 LAB — PT/INR
INR: 1.47 — ABNORMAL HIGH (ref 0.80–1.20)
PROTHROMBIN TIME: 17 seconds — ABNORMAL HIGH (ref 9.1–13.9)

## 2021-03-02 MED ORDER — ASPIRIN 81 MG CHEWABLE TABLET
81.0000 mg | CHEWABLE_TABLET | Freq: Every day | ORAL | 0 refills | Status: AC
Start: 2021-03-02 — End: 2021-04-01

## 2021-03-02 MED ORDER — WARFARIN 2 MG TABLET
2.0000 mg | ORAL_TABLET | Freq: Every evening | ORAL | 0 refills | Status: AC
Start: 2021-03-02 — End: 2021-03-16

## 2021-03-02 MED ORDER — HYDROXYZINE PAMOATE 50 MG CAPSULE
50.0000 mg | ORAL_CAPSULE | Freq: Four times a day (QID) | ORAL | 0 refills | Status: DC | PRN
Start: 2021-03-02 — End: 2023-02-17

## 2021-03-02 MED ORDER — MELATONIN 3 MG TABLET
6.0000 mg | ORAL_TABLET | Freq: Every evening | ORAL | 0 refills | Status: AC
Start: 2021-03-02 — End: 2021-04-01

## 2021-03-02 MED ORDER — SEVELAMER CARBONATE 0.8 GRAM ORAL POWDER PACKET
2400.0000 mg | Freq: Three times a day (TID) | ORAL | 0 refills | Status: DC
Start: 2021-03-02 — End: 2023-02-17
  Filled 2021-03-02: qty 90, 10d supply, fill #0

## 2021-03-02 MED ORDER — SEVELAMER CARBONATE 0.8 GRAM ORAL POWDER PACKET
2400.0000 mg | Freq: Three times a day (TID) | ORAL | 0 refills | Status: DC
Start: 2021-03-02 — End: 2021-03-02

## 2021-03-02 MED ORDER — WARFARIN 2 MG TABLET
2.0000 mg | ORAL_TABLET | Freq: Every evening | ORAL | Status: DC
Start: 2021-03-02 — End: 2021-03-02
  Filled 2021-03-02: qty 1

## 2021-03-02 MED ORDER — VITAMIN B COMPLEX AND VITAMIN C NO.20-FOLIC ACID 1 MG CAPSULE
1.0000 | ORAL_CAPSULE | Freq: Every day | ORAL | 0 refills | Status: AC
Start: 2021-03-02 — End: 2021-04-01

## 2021-03-02 MED ORDER — WARFARIN 2 MG TABLET
2.0000 mg | ORAL_TABLET | Freq: Every evening | ORAL | 0 refills | Status: DC
Start: 2021-03-02 — End: 2021-03-02
  Filled 2021-03-02: qty 14, 14d supply, fill #0

## 2021-03-02 MED ORDER — QUETIAPINE 50 MG TABLET
50.0000 mg | ORAL_TABLET | Freq: Every evening | ORAL | 0 refills | Status: AC
Start: 2021-03-02 — End: 2021-04-01

## 2021-03-02 MED ORDER — SODIUM BICARBONATE 650 MG TABLET
1300.0000 mg | ORAL_TABLET | Freq: Three times a day (TID) | ORAL | 0 refills | Status: AC
Start: 2021-03-02 — End: 2021-04-01

## 2021-03-02 MED ORDER — BACITRACIN 500 UNIT/GRAM TOPICAL PACKET
1.0000 | PACK | Freq: Once | CUTANEOUS | Status: AC
Start: 2021-03-02 — End: 2021-03-02
  Administered 2021-03-02: 1 via TOPICAL
  Filled 2021-03-02: qty 1

## 2021-03-02 MED ORDER — FAMOTIDINE 20 MG TABLET
20.0000 mg | ORAL_TABLET | Freq: Every day | ORAL | 0 refills | Status: AC
Start: 2021-03-02 — End: 2021-04-01

## 2021-03-02 MED ORDER — METOPROLOL TARTRATE 25 MG TABLET
25.0000 mg | ORAL_TABLET | Freq: Two times a day (BID) | ORAL | 0 refills | Status: AC
Start: 2021-03-02 — End: 2021-04-01
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
## 2021-03-02 NOTE — Transitional Care (Signed)
Hospital discharge follow up appointment is scheduled for 03/06/21 with the TCC at 2 pm and a new patient appointment with [facility] with Dr. Gar Ponto on 03/08/21 at 10:15 am. Appointment date and time added to the AVS.  Patient verbalized understanding.Lesly Dukes, LPN  [id number], [id number]

## 2021-03-02 NOTE — Care Plan (Signed)
Problem: Adult Inpatient Plan of Care  Goal: Plan of Care Review  Outcome: Adequate for Discharge  Goal: Patient-Specific Goal (Individualized)  Outcome: Adequate for Discharge  Goal: Absence of Hospital-Acquired Illness or Injury  Outcome: Adequate for Discharge  Goal: Optimal Comfort and Wellbeing  Outcome: Adequate for Discharge  Goal: Rounds/Family Conference  Outcome: Adequate for Discharge     Problem: Skin Injury Risk Increased  Goal: Skin Health and Integrity  Outcome: Adequate for Discharge     Problem: Infection Progression (Sepsis)  Goal: Absence of Infection Signs and Symptoms  Outcome: Adequate for Discharge     Problem: Fall Injury Risk  Goal: Absence of Fall and Fall-Related Injury  Outcome: Adequate for Discharge     Problem: Acute Rehab Services Goal & Intervention Plan  Goal: Gait Training Goal  Description: Stand Alone Therapy Goal  Outcome: Adequate for Discharge  Goal: Interprofessional Goal  Description: Stand Alone Therapy Goal  Outcome: Adequate for Discharge  Goal: Occupational Therapy Goals  Description: Stand Alone Therapy Goal  Outcome: Adequate for Discharge  Goal: Physical Therapy Goal  Description: Stand Alone Therapy Goal  Outcome: Adequate for Discharge     Problem: Pain Acute  Goal: Acceptable Pain Control and Functional Ability  Outcome: Adequate for Discharge     Problem: Pain (Cardiovascular Surgery)  Goal: Acceptable Pain Control  Outcome: Adequate for Discharge     Problem: Behavioral Health Comorbidity  Goal: Maintenance of Behavioral Health Symptom Control  Outcome: Adequate for Discharge

## 2021-03-02 NOTE — Progress Notes (Signed)
[place] [facility] SERV[facility] PROGRESS NOTE    Name: [name] [name] Bed: 664/A   Age: 35 y.o. 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  No acute events overnight.  No new complaints.  Reports her pain is well controlled.  Denies any cravings or feelings of withdrawal on current dose of Suboxone.  Appetite is good.  Urinating moving her bowels without difficulty.  Acknowledges that she will take her antimicrobial therapy as prescribed upon discharge.  No difficulty drawing or flushing from the PICC line.  Suitable for discharge at this time.    REVIEW OF SYSTEMS:  Constitutional: Denies fevers and chills  Pulmonary:  Denies shortness of breath and cough    OBJECTIVE:  I have reviewed the vital signs.   Vital Signs:  Last 24 Hours Last Check   Temp  Avg: 36.6 C (97.8 F)  Min: 36.4 C (97.5 F)  Max: 36.7 C (98.1 F) Temperature: 36.7 C (98.1 F) (03/01/21 2040)   Pulse  Avg: 75.5  Min: 75  Max: 76 Heart Rate: 75 (03/01/21 2040)   BP  Min: 113/70  Max: 138/90 BP (Non-Invasive): (!) 138/90 (03/01/21 2040)   Resp  Avg: 16  Min: 16  Max: 16 Respiratory Rate: 16 (03/01/21 2040)    SpO2  Avg: 96 %  Min: 94 %  Max: 98 % SpO2: 98 % (03/01/21 2040)   MAP (Non-Invasive)  Avg: 95.5 mmHG  Min: 85 mmHG  Max: 106 mmHG     Height: 165.1 cm (_0 )   Weight: 67.6 kg (149 lb 0.5 oz) (02/26/21 0654)     FiO2: Oxygen Therapy  SpO2: 98 %  Pulse Ox Frequency: continuous  HR-SpO2: 85 bpm  O2 Delivery Source: None (Room Air)  Flow (L/min): 2  Set PEEP: 5 cmH2O  Oxygen Concentration (%): 21  FiO2: 40 %    I/O:     Emesis:     None  BM:    Date of Last Bowel Movement: 02/28/21  Heme:      N/A    Nutrition/Residuals:  MNT PROTOCOL FOR DIETITIAN  DIETARY ORAL SUPPLEMENTS Oral Supplements with tray: Magic Cup-Orange; LUNCH/DINNER; 1 Each  DIET RENAL (60G PRO,2G NA,2G K)    Blood Sugars:   No results found for this encounter      Physical Exam:    General:  No apparent distress.  Does not appear acutely toxic.    Eyes: Pupils equal and reactive bilaterally.  No scleral icterus.  ENT: Mucous membranes moist.  Edentulous.  Neck: Supple.  No cervical lymphadenopathy  Lungs:  Clear breath sounds bilaterally.  Equal lateral chest wall expansion with inhalation  Cardiovascular: Regular rate and rhythm.  No murmur.  Trace bilateral lower extremity edema  Lines:  Right upper extremity PICC present without surrounding erythema drainage  Abdomen: Non distended, soft, and non-tender to palpation.  Normoactive bowel sounds  Extremities:  No warm, red, swollen joints.  No cyanosis of digits.  Skin: Warm and dry.  Resolving vasculitic rash on lower legs.  Surgical incision on chest wall well approximated without surrounding erythema drainage  Neurologic: Spontaneously moves  all 4 limbs.  No tremors.  Psychiatric: Pleasant affect.  Alert and oriented x3.     Labs:  I have reviewed the lab results.    CBC:  Recent Labs     02/28/21  0545 03/01/21  0532   WBC 6.4 7.0   HGB 7.3* 7.3*   HCT 23.2* 22.6*   PLTCNT 263 237     Differential:   Recent Labs     02/28/21  0545 03/01/21  0532   PMNS 62 67   MONOCYTES 9 8   BASOPHILS 2  0.10 1  <0.10   PMNABS 3.99 4.75   LYMPHSABS 1.61 1.59   MONOSABS 0.55 0.54   EOSABS <0.10 <0.10     BMP:  Recent Labs     02/28/21  0545 03/01/21  0532   SODIUM 139 135*   POTASSIUM 3.8 3.8   CHLORIDE 110 106   CO2 19* 18*   BUN 57* 60*   CREATININE 1.70* 1.74*   GLUCOSENF 88 118   ANIONGAP 10 11   GFR 40* 39*   CALCIUM 8.3* 7.8*   MAGNESIUM 2.0 1.9   PHOSPHORUS 7.3* 6.9*     RBC Indices:  Recent Labs     02/28/21  0545 03/01/21  0532   MCV 85.3 84.6   MCH 26.8 27.3   MCHC 31.5 32.3   MPV 8.1*  8.2*     Coagulation Studies:  Recent Labs     02/28/21  0545 03/01/21  0532 03/02/21  0456   INR 1.47* 1.42* 1.47*   PROTHROMTME 17.0* 16.5* 17.0*     Liver/Pancreas:  No results for input(s): TOTALPROTEIN, ALBUMIN, PREALBUMIN, AST, ALT, ALKPHOS, LDH, AMYLASE, LIPASE, BILIRUBINCON, TOTBILIRUBIN in the last 72 hours.    Invalid input(s): GGT  Inflammatory Markers:  Recent Labs     03/01/21  0532   CREAPROINFLA 37.5*     Antimicrobial Markers:  Recent Labs     03/01/21  0532   CPK 10*       Radiology:   I have reviewed the images.      EKG:   I have reviewed the tracings.   Recent Results (from the past 720 hour(s))   ECG 12-LEAD    Collection Time: 02/05/21  1:00 AM   Result Value    Ventricular rate 99    Atrial Rate 99    PR Interval 136    QRS Duration 86    QT Interval 338    QTC Calculation 433    Calculated P Axis 41    Calculated R Axis 13    Calculated T Axis 21    Narrative    Normal sinus rhythm  Normal ECG  No previous ECGs available  Preliminary by fellow Harlow Asa, MD, AHMED [phone]) on 02/05/2021 1:22:59 PM  Confirmed by Pasty Spillers MD, ANTHONY ([id number]) on 02/05/2021 7:19:09 PM     Microbiology:   I have reviewed the microbiology.  Hospital Encounter on 02/05/21 (from the past 96 hour(s))   ADULT ROUTINE BLOOD CULTURE, SET OF 2 ADULT BOTTLES (BACTERIA AND YEAST)    Collection Time: 02/26/21 10:41 AM    Specimen: Blood   Culture Result Status    BLOOD CULTURE, ROUTINE No Growth 3 Days Preliminary     Pathology:   I have reviewed the pathology.   Results for orders placed or performed during the hospital encounter of 02/05/21   SURGICAL PATHOLOGY SPECIMEN   Result Value    Final Diagnosis  A.  SKIN, RIGHT LOWER EXTREMITY, PUNCH BIOPSY:  - Leukocytoclastic vasculitis.  (See Comment.)    B.  SKIN, RIGHT LOWER EXTREMITY, PUNCH BIOPSY FOR DIRECT IMMUNOFLUORESCENCE:   - Deposits of IgM, C3 and fibrinogen in a perivascular pattern in the upper dermis.   - No specific deposits of IgG and IgA identified on cryostat  examined sections.      Diagnosis Comment      A.  The specimen shows perivascular infiltrate of neutrophils with fragmented nuclear debris (leukocytoclasis). The neutrophils are extending into the epidermis with neutrophilic exocytosis. There is extravasation of erythrocytes in the dermis and fibrinoid necrosis of blood vessels. PAS fungal stain is negative for fungal microorganisms. 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 Clinical correlation is recommended.    B.  The immunofluorescent features are consistent with leukocytoclastic vasculitis.   (The immunoreactant and special stain controls were appropriately reactive       Microscopic Description      Performed.      Gross Description      A. Skin.  Part A is received in formalin labeled "Licia, Harl" and "right lower extremtiy". It consists of a 0.4 x 0.4 cm circular piece of tan skin excised to a depth of 0.4 cm.  The specimen is bisected and submitted entirely in cassette A1.    B. Skin.  Part B is received in [employer] labeled "Janesia, Joswick" and "right lower extremtiy". It consists of a 0.4 x 0.4 cm circular piece of tan skin excised to a depth of 0.4 cm.  The specimen is submitted entirely for immunofluorescence studies.     SURGICAL PATHOLOGY SPECIMEN   Result Value    Final Diagnosis      Tricuspid valve, resection:  . Valve tissue with fibrosis and adherent vegetations containing numerous colonies of bacterial organisms, consistent with infective endocarditis      Microscopic Description      Performed.      Clinical History      . Tricuspid regurgitation - severe  . MRSA Bacteremia  . IV Drug Abuse  . Septic Emboli      Gross Description      A. Heart.  Part A is received in formalin, labeled "Marriana, Hibberd" and "tricuspid valve". It consists of a 2.5 x 2.0 x 0.5 cm aggregate of tan-pink hemorrhagic shaggy pliable valve tissue with attached red-brown vegetations.  The specimen is submitted in toto in cassette  A1.      Addendum Report      Special stains for microorganisms (Gram and GMS) were performed, with appropriate controls.  These confirm numerous colonies of predominantly Gram positive coccal organisms.  GMS stain further highlights the nonviable forms.       PT/OT: Yes    Consults:   IP CONSULT TO NEPHROLOGY - ORDERING PROVIDER MUST CALL/PAGE SERVICE  IP CONSULT TO INFECTIOUS DISEASES - ORDERING PROVIDER MUST CALL/PAGE SERVICE  IP CONSULT TO NUTRITION SERVICES  IP CONSULT TO CARE MANAGEMENT  SUICIDE RISK ASSESSMENT - CONSULT (IP ONLY)  IP CONSULT TO DERMATOLOGY - ORDERING PROVIDER MUST CALL/PAGE SERVICE  IP CONSULT TO PSYCHIATRY - SUBSTANCE ABUSE DISORDER - ORDERING PROVIDER MUST CALL/PAGE SERVICE     Current Inpatient Medications: acetaminophen (TYLENOL) tablet, 650 mg, Oral, Q4H PRN  aspirin chewable tablet 81 mg, 81 mg, Oral, Daily  B complex-vitamin C-folic acid (NEPHROCAPS) capsule, 1 Capsule, Oral, Daily  bisacodyl (DULCOLAX) rectal suppository, 10 mg, Rectal, Daily PRN  buprenorphine-naloxone (SUBOXONE) 2-0.77m per  sublingual film, 2 Film, Sublingual, 2x/day  calcium carbonate (TUMS) 552m (2020melemental calcium) chewable tablet, 500 mg, Oral, 3x/day PRN  famotidine (PEPCID) tablet, 20 mg, Oral, Daily  hydrALAZINE (APRESOLINE) injection 10 mg, 10 mg, Intravenous, Q4H PRN  hydrOXYzine pamoate (VISTARIL) capsule, 50 mg, Oral, 4x/day PRN  ipratropium (ATROVENT) 0.02% nebulizer solution, 0.5 mg, Nebulization, Q4H PRN  lidocaine-menthol (LIDOPATCH) 3.6%-1.25% patch, 1 Patch, Transdermal, Daily  lidocaine-menthol (LIDOPATCH) 3.6%-1.25% patch, 1 Patch, Transdermal, Daily  lidocaine-menthol (LIDOPATCH) 3.6%-1.25% patch, 1 Patch, Transdermal, Daily  linezolid (ZYVOX) tablet, 600 mg, Oral, 2x/day  magnesium hydroxide (MILK OF MAGNESIA) 40043mer 5mL1mal liquid, 30 mL, Oral, Daily PRN  melatonin tablet, 6 mg, Oral, NIGHTLY  metoprolol tartrate (LOPRESSOR) tablet, 25 mg, Oral, 2x/day  miconazole nitrate (SECURA) 2%  topical cream, , Apply Topically, 2x/day  nicotine polacrilex (COMMIT) lozenge, 2 mg, Oral, Q2H PRN  NS flush syringe, 10-30 mL, Intracatheter, Q8HRS  NS flush syringe, 20-30 mL, Intracatheter, Q1 MIN PRN  ondansetron (ZOFRAN) 2 mg/mL injection, 4 mg, Intravenous, Q8H PRN  perflutren lipid microspheres (DEFINITY) 1.3 mL in NS 10 mL (tot vol) injection, 2 mL, Intravenous, Give in Cardiology  polyethylene glycol (MIRALAX) oral packet, 34 g, Oral, Daily  QUEtiapine (SEROQUEL) tablet, 50 mg, Oral, NIGHTLY  rifAMPin (RIFADIN) capsule, 300 mg, Oral, 2x/day  sennosides-docusate sodium (SENOKOT-S) 8.6-50mg per tablet, 2 Tablet, Oral, 2x/day  sevelamer carbonate (RENVELA) oral powder, 2,400 mg, Oral, 3x/day-Meals  sodium bicarbonate tablet, 1,300 mg, Oral, 3x/day  sodium chloride 3% nebulizer solution, 3 mL, Nebulization, Q6H PRN  sodium citrate 4% (3 mL) injection syringe, 2 Syringe, Intracatheter, Q1H PRN  triamcinolone acetonide (ARISTOCORT A) 0.1% topical ointment, , Apply Topically, 2x/day  Warfarin Placeholder, , Does not apply, Daily PRN    Allergies:  No Known Allergies    ASSESSMENT/PLAN:    Inpatient Summary: Ms. 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  [facility], and she was admitted there 02/01/2021-02/04/2021 prior to being transferred to our facility.  She was intubated for airway protection.  She had progression of acute kidney injury to anuric  acute renal failure requiring dialysis through a temporary dialysis catheter.  She developed anemia requiring blood transfusion and thrombocytopenia.  Blood cultures 02/01/2021-02/04/2021 grew MRSA (vancomycin MIC 2, rifampin MIC 1) with clearance of bacteremia not achieved at that time.  Chest radiograph on 01/31/2021 showed right middle lobe patchy opacity concerning for aspiration, pulmonary vascular congestion, and cardiomegaly.  CT brain without contrast on 01/31/2021 showed no acute intracranial abnormality.  Renal ultrasound on 02/02/2021 was unremarkable.  Transthoracic echocardiogram on 02/03/2021 showed left ventricular ejection fraction 55-60%, large tricuspid vegetation 1.91 cm X 1.67 cm with moderate tricuspid regurgitation, and trivial mitral regurgitation.  Repeat CT brain without contrast on 02/04/2021 showed no acute intracranial abnormality.  CT chest without contrast on 02/04/2021 showed multiple bilateral pulmonary nodules (solid, groundglass, and cavitary) predominantly located peripherally consistent with septic pulmonary emboli, small bilateral pleural effusion, and marked splenomegaly.  The patient received ceftriaxone on 01/31/2021, vancomycin 02/02/2021-02/04/2021, piperacillin-tazobactam 02/01/2021-02/02/2021, and cefepime and metronidazole 02/02/2021-02/04/2021.  Following arrival at our facility, blood cultures 02/05/2021-02/15/2021 grew MRSA (vancomycin MIC 1, daptomycin MIC 0.5, ceftaroline susceptible, rifampin MIC <=0.5) with clearance of bacteremia ultimately achieved postoperatively on 02/16/2021.  BAL cultures on 02/05/2021 and 02/13/2021 also grew MRSA.  Transthoracic echocardiogram on 02/05/2021 showed left ventricular ejection fraction 62.6%, tricuspid vegetation 1.4 cm X 0.8 cm with mild tricuspid regurgitation, and normal appearing mitral valve.  MRI brain without contrast on 02/06/2021 showed no acute intracranial abnormality.  MRI's cervical, thoracic, and lumbosacral spine without contrast on  02/10/2021 showed no evidence of infection.  Repeat transthoracic echocardiogram on 02/11/2021 showed tricuspid vegetation with severe tricuspid regurgitation.  The patient underwent extraction of remaining teeth on 02/09/2021.  She underwent bioprosthetic tricuspid valve replacement with primary closure of patient foramen ovale on 02/13/2021 when operative culture grew MRSA.  Surgical pathology showed valve tissue with fibrosis and adherent vegetations containing numerous colonies of bacterial organisms (predominantly gram positive cocci with nonviable forms) consistent with endocarditis.  She had her last dialysis session on 02/14/2021 with removal of her most recent left femoral temporary dialysis catheter at that time.  She developed a rash around 02/15/2021 that raised concern for vancomycin-induced rash.  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  Chest radiographs 02/17/2021-02/23/2021 showed bilateral pleural effusions with partially loculated right pleural effusion reported as of 02/22/2021.  The patient underwent right chest tube insertion on 02/23/2021 with immediate drainage of approximately 1 liter of bloody fluid consistent with hemothorax and subsequent removal on 02/25/2021.  Right pleural fluid culture on 02/23/2021 had no growth with no organisms on Gram stain.  The patient received vancomycin 02/06/2021-02/14/2021 prior to being transitioned to daptomycin (02/17/2021-present) and rifampin (02/16/2021-present).  Plan is for a total 6-week antimicrobial course dated from negative blood cultures (end date 03/29/2021).  As clearance of bacteremia was not achieved preoperatively and operative cultures were positive, will follow the initial 6-weeks of treatment with doxycycline oral suppression for 6 months.  Pre-enrollment testing for the OPTIMAL Trial performed on 02/26/2021 included blood cultures X 2 sets that are negative; CT chest, abdomen, and pelvis  without contrast that showed postoperative changes from tricuspid valve repair with loculated fluid along right heart border (similar appearance to right hemothorax prior to chest tube drainage; felt to be postoperative fluid/small amount of bloody fluid), redemonstration of numerous septic emboli in bilateral lungs with improving groundglass and intralobular septal thickening, mild interval worsening of small right pleural effusion and right basilar consolidation with trace hydropneumothorax on right, and marked hepatosplenomegaly; and transthoracic echocardiogram that showed left ventricular ejection fraction 61.2% and tricuspid bioprosthesis with normal leaflet motion and no paravalvular tricuspid regurgitation.  Our Psychiatry/Substance Use Treatment evaluated the patient and assisted with transition to Suboxone on 02/27/2021.  The patient consented for enrollment in the [place] and has been randomized to the oral antimicrobial arm (beginning 03/02/2021).  Her antimicrobial regimen will change to Linezolid 600 mg PO BID and rifampin 300 mg PO BID for the last 4 weeks (03/02/2021-03/29/2021) of a total 6-week antimicrobial treatment course prior to transitioning to doxycycline oral suppression (as of 03/30/2021) for 6 months.    Patient requires HIGH RISK DECISION MAKING due to the risk of complications, significant morbidity and/or mortality associated with the necessary therapy, specifically drug therapy, that requires intensive monitoring for toxicity resulting from the narrow therapeutic index of medications the patient is currently being administered, including but not limited to: antimicrobials such as Vancomycin, Daptomycin, and Gentamicin; new Warfarin/coumadin  for anticoagulation for new valve replacement; opioids for pain management in a patient with a history of substance abuse;  and/or medical alternative therapy such as Suboxone for history of substance abuse.     Additionally, this patient may  require HIGH RISK DECISION MAKING for a chronic illness which poses a threat to life or bodily function, specifically psychiatric illness of addiction/substance misuse (if listed below) with a potential threat to self, as this condition directly lead to the patient's life-threatening infection and current need for hospitalization.     Does the patient have a history of an underlying substance use disorder? Yes    Disposition: At this time, the patient is clinically stable and medically appropriate to be discharged to home. The patient is medically stable and has no limitations or restrictions on activity. More than 30 minutes en toto was spent examining the patient, reviewing the hospital stay with the patient, instructing the patient to maintain follow-up appointments, and preparation of the prescriptions and referral forms, as further documented in the Discharge Summary.     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 Renita Papa  - Microbiology:   [facility]:   - 02/01/2021 Blood cultures X 2 pediatric sets: MRSA (vancomycin MIC 2, rifampin MIC <1, tetracycline MIC <4) in 2 of 2 bottles   - 02/02/2021 Blood cultures X 2 sets: MRSA in 2 of 4 bottles (both sets)   - 02/02/2021 Sputum culture: MRSA as well as yeast and usual flora   - 02/04/2021 Blood cultures X 2 sets: MRSA in 2 of 4 bottles (both sets)   [facility]:   - 02/05/2021 BAL culture: 15,000 MRSA   - 02/05/2021 Blood cultures X 2 sets: MRSA (vancomycin MIC 1, daptomycin MIC 0.5, ceftaroline susceptible, rifampin MIC <=0.5, Linezolid MIC 2,  tetracycline MIC <=1, doxycycline MIC <=0.5) in 2 of 2 sets   - 02/07/2021 Blood cultures X 2 sets: MRSA in 2 of 2 sets   - 02/09/2021 Blood cultures X 2 sets: MRSA in 2 of 2 sets   - 02/11/2021 Blood cultures X 2 sets: MRSA in 2 of 2 sets   - 02/13/2021 Blood cultures X 2 sets: MRSA in 2 of 2 sets   - 02/13/2021 Operative culture from tricuspid valve tissue: MRSA   - 02/13/2021 BAL culture: MRSA   - 02/14/2021 Blood cultures X 2 sets: MRSA in 2 of 2 sets   - 02/15/2021 Blood cultures X 2 sets: MRSA in 2 of 2 sets   - 02/16/2021 Blood cultures X 2 sets: No growth (clearance of bacteremia achieved)   - 02/23/2021 Right pleural fluid culture: No growth   - 02/25/2021 Blood cultures X 2 sets: Pending  - Pathology: 02/13/2021 Surgical pathology: Valve tissue with fibrosis and adherent vegetations containing numerous colonies of bacterial organisms (predominantly gram positive cocci with nonviable forms) consistent with endocarditis  - Antimicrobial Therapy: Daptomycin 8 mg/kg IV Q24 hours + rifampin 300 mg PO BID  - Duration of antimicrobial therapy and end date: 6 weeks (2 weeks IV + 4 weeks PO) dated from negative blood cultures (02/16/2021 = day 1); end date 03/29/2021   - Transition to linezolid 600 mg  PO BID and rifampin 300 mg PO BID for the last 4 weeks (03/02/2021-03/29/2021) of a total 6-week antimicrobial treatment course; patient's Medicaid termed - linezolid and rifampin able to be provided at [facility] [employer] $24.46 total, which patient is able to afford (and patient will contact [facility] to restart her Medicaid); counseling regarding both antimicrobials was provided to the patient   - Following completion of the above regimen, transitioning to doxycycline oral suppression (as of 03/30/2021) for 6 months - will prescribe at time of follow-up in Infect[facility] Diseases Clinic unless also able to get at [employer] [facility] on discharge  - Location of central access and date placed: PICC placed in right brachial vein on 02/21/2021  - remove prior to discharge  - Labs: Minimum of twice weekly CBC with differential, BMP, magnesium, hepatic function panel, and CRP as well as CK weekly while inpatient; weekly CBC with differential, BUN, creatinine, total bilirubin, AST, ALT, alkaline phosphatase, and CRP after discharge (external orders placed)  - Additional medications: Aspirin 81 mg PO daily, bumetanide 1 mg IV daily, metoprolol tartrate 25 mg PO BID    Anticoagulation:  - Indication: Tricuspid valve replacement  - Medication/Therapy: Warfarin  - Current dose:  Maintain at 2 mg q.h.s.  - Anticoagulation needed at discharge: Yes    - Will require 3 months postoperatively unless otherwise specified by Dr. Renita Papa  - Goal INR: 2  - Last INR:  1.47  - Outpatient INR Monitoring: Cardiac Surgery team will discuss INR monitoring with Dr. Renita Papa on 02/28/2021 assuming anticoagulation is continued; will need close monitoring of INR once rifampin is completed as warfarin dose will likely need decreased at that time    Pain Control:   - Current Regimen:   - Acetaminophen 650 mg PO Q4 hours prn   - Lidocaine patch 3 TD daily  - Bowel regimen: Senokot-S 2 tabs PO BID, Miralax 34 g daily    History of Substance Misuse:  - Diagnosis: Opioid use disorder, Stimulant use disorder, Alcohol use disorder in remission, Sedative-hypnotic use disorder in remission  - Substance(s) Used: Heroin, methamphetamine, prior alcohol, prior gabapentin  - Medical/Withdrawal Therapy: N/A  - Evaluation by Psychiatry: Yes  - Agreeable to individual therapy: TBD  - Agreeable to group therapy: TBD  - Agreeable to outpatient therapy: Yes  - Agreeable to medical alternative therapy: Yes - transitioned to Suboxone on 02/27/2021  - Agreeable to DDU: N/A  - Agreeable to [facility]: TBD  - Agreeable to [place]: TBD  - Risk to discharge with PICC: High    Screening:  - UDS (on admission): Not done  - UDS (most recent): 02/27/2021 Positive for buprenorphine and oxycodone  (had just been given both while inpatient)  - Syphilis Screening: Negative  - Urine Gonorrhea/Chlamydia: Previously ordered  - Tdap: Discuss before discharge  - SARS-CoV-2 (COVID-19) Vaccination: Discuss before discharge  - SARS-CoV-2 (COVID-19) Screening: 02/27/2021 negative    Cleared hepatitis C   - Antibody Positive Date: 02/05/2021  - Quantitative RNA PCR Value: 02/05/2021 Target not detected  - HIV Screen: Negative  - Hep A IgG: Reactive (immune)  - Hep B Surface Ag: Negative  - Hep B Surface Ab: 497  - Hep B Core Ab: Negative    Right hemothorax - improved  - Status post right chest tube insertion on 02/23/2021 and removal on 02/25/2021    Idiopathic postoperative anemia - still present  - Most recently transfused PRBCs on 02/21/2021, 02/24/2021, and 02/27/2021  -  Follow hemoglobin and hematocrit; transfuse additional PRBCs for hemoglobin <7  - POC Anticoagulation Clinic to follow INR s/p DC per updates from Dr. Alfonzo Feller on 03/01/21    Acute kidney injury on possible CKD 3 - improving  - Nephrology following  - Decrease bumetanide to 1 mg IV Q24 hours; will need to finalize recommendations regarding an oral diuretic regimen prior to discharge  - Continue sodium bicarbonate 650 mg PO TID  - Continue Nephrocaps 1 capsule PO daily  - Monitor BMP, magnesium, and phosphorus and I/Os    Leukocytoclastic vasculitis - improved  - Confirmed by punch biopsy pathology on 02/16/2021  - Continue triamcinolone 0.1% topical ointment BID for now    Insomnia - improved  - Continue melatonin 5 mg PO nightly      Chronic Issues:   Unspecified anxiety and depressive disorders - improved  - Continue quetiapine 100 mg PO nightly    Tobacco use disorder - stable  - Counsel regarding smoking cessation this admission  - Continue nicotine replacement therapy    Resolved Issues:   Dialysis requirement    DVT/PE Prophylaxis/Anticoagulation: Warfarin    Follow Up Appointments:  PCP/TCC: Set up a PCP in [place], [place], on discharge; follow-up in [facility] 1  week after discharge if unable to establish with PCP that soon  Infectious Diseases: Telephone follow-up with Dr. Alfonzo Feller in [facility] on 03/15/2021 at 11:00 AM (being scheduled) as well as in-person follow-up 1 month after discharge on same day as appointment with Cardiac Surgery (will schedule before discharge)  Cardiac Surgery: Follow up with Dr. Renita Papa 1 month after discharge (will schedule before discharge)  Psychiatry: Follow up in [facility] (preferred if feasible) or [place] with release of information to obtain record of appointment attendance while completing treatment phase with oral antimicrobial therapy  Nephrology: TO PLACE F/U ORDERS PRIOR TO [place] ON Friday 03/01/21    My clinical impression/findings:  Currently patient continues to do well.  Surgical incision well healing.  PICC line intact without any obvious signs manipulation.  Patient understands the importance of adherence to her antimicrobial regimen as well as follow-up.  Voices no other concerns or questions at this time.  Will discharge to home.    My independent time spent on this patient including:   . Chart Review   . Counseling or educating the patient, family or caregiver  . Ordering medications, tests or procedures  . Care coordination   . Referring the patient to and communicating with other health care professionals   . Face to Face Time  . Charting Time  Physician Time:  25 minutes  Total Time:  48 minutes    Jermarion Poffenberger Maebelle Munroe MD, PhD 03/02/2021, 05:45  Assistant Professor,   [place] and Infectious Disease  Department of [place] [facility].

## 2021-03-02 NOTE — Consults (Signed)
Patient Name: Adele Tillison  Date of Birth: 01-16-1986  MRN: [id number]    Date of Service: 03/02/2021  PSYCHIATRY FOLLOW UP  Intake appointment on Tuesday 9/20 at 9:00am at [facility]  [place], [place] [address]  Phone: [phone]  *of note, establishing at [facility] for Suboxone and psychiatric services are done on a walk-in basis Monday-Friday between 7:45am-9:45am or 1:00pm-2:45pm. After discussion with SUD team it would be best to give patient an exact time and day to present at clinic. Patient will be given 1 week of Suboxone as a bridge and to give patient time to establish.  Added to discharge instructions for patient review.  A 7 day Suboxone bridge script has been called into the discharge pharmacy.  Suboxone Films  8-'2mg'$   1 SL daily  Dispense 7  No refills.  Jerlyn Ly, CASE MANAGER  03/02/2021  08:53  Psychiatry Consults

## 2021-03-02 NOTE — Telephone Encounter (Signed)
Pt's mother who is the primary contact number listed for pt in epic notified of pt's appt date/time & location.  Directions provided.  Instructed her pt is to complete labs 3-4 days prior to appt.  Mother verbalized understanding. Electronic lab orders printed and faxed to [facility] lab per mother's request.  [phone]).  A.Yanna Leaks, RN

## 2021-03-02 NOTE — Discharge Summary (Addendum)
[facility]  DISCHARGE SUMMARY    PATIENT NAME:  [name] [name], [name] [name]  MRN:  [id number]  DOB:  April 14, 1986    ENCOUNTER DATE:  02/05/2021  INPATIENT ADMISSION DATE: 02/05/2021  DISCHARGE DATE:  03/02/2021    ATTENDING PHYSICIAN: Georgina Peer, MD, PhD  SERVICE: HOSPITALIST 7 INFUSION SERVICES  PRIMARY CARE PHYSICIAN: [facility]     Has PCP been verified with patient and updated? Yes    LAY CAREGIVER: [place] Caregiver Name: Nicholos Johns Caregiver Contact Number: [phone], Lay Caregiver Relationship to patient: parent    PRIMARY DISCHARGE DIAGNOSIS:    Active Hospital Problems    Diagnosis Date Noted   . Endocarditis [I38] 02/05/2021      Resolved Hospital Problems   No resolved problems to display.     Active Non-Hospital Problems    Diagnosis Date Noted   . S/P TVR (tricuspid valve replacement) 03/01/2021        DISCHARGE MEDICATIONS:     Current Discharge Medication List      START taking these medications.      Details   aspirin 81 mg Tablet, Chewable   Chew 1 Tablet (81 mg total) Once a day for 30 days  Qty: 30 Tablet  Refills: 0     B complex-vitamin C-folic acid 1 mg Capsule  Commonly known as: NEPHROCAPS   Take 1 Capsule by mouth Once a day for 30 days  Qty: 30 Capsule  Refills: 0     buprenorphine-naloxone 2-0.5 mg Film  Commonly known as: SUBOXONE   Place 2 Film under the tongue Twice daily  Refills: 0     famotidine 20 mg Tablet  Commonly known as: PEPCID   Take 1 Tablet (20 mg total) by mouth Once a day for 30 days  Qty: 30 Tablet  Refills: 0     hydrOXYzine pamoate 50 mg Capsule  Commonly known as: VISTARIL   Take 1 Capsule (50 mg total) by mouth Four times a day as needed for Anxiety  Qty: 45 Capsule  Refills: 0     linezolid 600 mg Tablet  Commonly known as: ZYVOX   Take 1 Tablet (600 mg total) by mouth Twice daily for 28 days  Qty: 28 Tablet  Refills: 1     melatonin 3 mg Tablet   Take 2 Tablets (6 mg total) by mouth Every night for 30 days  Qty: 60 Tablet  Refills: 0     metoprolol  tartrate 25 mg Tablet  Commonly known as: LOPRESSOR   Take 1 Tablet (25 mg total) by mouth Twice daily for 30 days  Qty: 60 Tablet  Refills: 0     polyethylene glycol 17 gram Powder in Packet  Commonly known as: MIRALAX   Take 2 Packets (34 g total) by mouth Once a day  Refills: 0     QUEtiapine 50 mg Tablet  Commonly known as: SEROQUEL   Take 1 Tablet (50 mg total) by mouth Every night for 30 days  Qty: 30 Tablet  Refills: 0     rifAMPin 300 mg Capsule  Commonly known as: RIFADIN   Take 1 Capsule (300 mg total) by mouth Once a day for 28 days  Qty: 28 Capsule  Refills: 0     sennosides-docusate sodium 8.6-50 mg Tablet  Commonly known as: SENOKOT-S   Take 2 Tablets by mouth Twice daily  Refills: 0     sevelamer Carbonate 0.8 gram Powder in Packet  Commonly known as: RENVELA  Take 3 Packets (2,400 mg total) by mouth Three times daily with meals  Qty: 90 Packet  Refills: 0     sodium bicarbonate 650 mg Tablet   Take 2 Tablets (1,300 mg total) by mouth Three times a day for 30 days  Qty: 180 Tablet  Refills: 0     warfarin 2 mg Tablet  Commonly known as: COUMADIN   Take 1 Tablet (2 mg total) by mouth Every night for 14 days  Qty: 14 Tablet  Refills: 0          Discharge med list refreshed?  YES     During this hospitalization did the patient have an AMI, PCI/PCTA, STENT or Isolated CABG?  No              ALLERGIES:  No Known Allergies    HOSPITAL PROCEDURE(S):   Bedside Procedures:  Orders Placed This Encounter   Procedures   . BEDSIDE  NIAGRA/DIALYSIS CATHETER INSERTION/REMOVAL   . BEDSIDE  MISC PROCEDURE   . BEDSIDE  BRONCHOSCOPY   . BEDSIDE  NIAGRA/DIALYSIS CATHETER INSERTION/REMOVAL   . BEDSIDE  BRONCHOSCOPY   . BEDSIDE  CHEST TUBE INSERTION/REMOVAL   . BEDSIDE  PIGTAIL CATHETER INSERTION/REMOVAL   . BEDSIDE  PIGTAIL CATHETER INSERTION/REMOVAL     Surgical   Surgical/Procedural Cases on this Admission     Case IDs Date Procedure Surgeon Location Status    [id number] 02/09/21 EXTRACTION TEETH MULTIPLE Baird Kay,  DDS [place]    [id number] 02/13/21 PERFUSION CHARGE/STBY/CARDIOPULMONARY BYPASSAUTOLOGOUS BLOOD CONSERVATION SETUPAUTOLOGOUS BLOOD CONSERVATION, MONITORING PER HOURREPLACEMENT VALVE TRICUSPIDSTERNOTOMY MEDIASTINALCLOSURE PATENT FORAMEN OVALE Mehaffey, Jeneen Rinks, MD [place] [facility] OR/PROC LOCATION Comp          REASON FOR HOSPITALIZATION AND HOSPITAL COURSE     BRIEF HPI:  Per H&P on 02/05/2021: "Pt is a 35 yo female with a significant PMH for Smoking and IVDU presented to [facility] with AMS on 01/31/21. She was found to have a drug overdose, aspiration PNA requiring intubation, MRSA bacteremia, large TV endocarditis with moderate TR and elevated systemic pulmonary pressures (70mhg), AKI requiring HD, anemia requiring PRBC and thrombocytopenia. Urine drug screen + for heroin and methamphetamines.    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 [facility], and she was admitted there 02/01/2021-02/04/2021 prior to being transferred to our facility. She was intubated for airway protection. She had progression of acute kidney injury to anuric acute renal failure requiring dialysis through a temporary dialysis catheter. She developed anemia requiring blood transfusion and thrombocytopenia. Blood cultures  02/01/2021-02/04/2021 grew MRSA (vancomycin MIC 2, rifampin MIC 1) with clearance of bacteremia not achieved at that time. Chest radiograph on 01/31/2021 showed right middle lobe patchy opacity concerning for aspiration, pulmonary vascular congestion, and cardiomegaly. CT brain without contrast on 01/31/2021 showed no acute intracranial abnormality. Renal ultrasound on 02/02/2021 was unremarkable. Transthoracic echocardiogram on 02/03/2021 showed left ventricular ejection fraction 55-60%, large tricuspid vegetation 1.91  cm X 1.67 cm with moderate tricuspid regurgitation, and trivial mitral regurgitation. Repeat CT brain without contrast on 02/04/2021 showed no acute intracranial abnormality. CT chest without contrast on 02/04/2021 showed multiple bilateral pulmonary nodules (solid, groundglass, and cavitary) predominantly located peripherally consistent with septic pulmonary emboli, small bilateral pleural effusion, and marked splenomegaly. The patient received ceftriaxone on 01/31/2021, vancomycin 02/02/2021-02/04/2021, piperacillin-tazobactam 02/01/2021-02/02/2021, and cefepime and metronidazole 02/02/2021-02/04/2021. Following arrival at our facility, blood cultures 02/05/2021-02/15/2021 grew MRSA (vancomycin MIC 1, daptomycin MIC 0.5, ceftaroline susceptible, rifampin MIC <=0.5) with clearance of bacteremia ultimately achieved postoperatively on 02/16/2021. BAL cultures on 02/05/2021 and 02/13/2021 also grew MRSA. Transthoracic echocardiogram on 02/05/2021 showed left ventricular ejection fraction 62.6%, tricuspid vegetation 1.4 cm X 0.8 cm with mild tricuspid regurgitation, and normal appearing mitral valve. MRI brain without contrast on 02/06/2021 showed no acute intracranial abnormality. MRI's cervical, thoracic, and lumbosacral spine without contrast on 02/10/2021 showed no evidence of infection. Repeat transthoracic echocardiogram on 02/11/2021 showed tricuspid vegetation with severe tricuspid regurgitation. The  patient underwent extraction of remaining teeth on 02/09/2021. She underwent bioprosthetic tricuspid valve replacement with primary closure of patient foramen ovale on 02/13/2021 when operative culture grew MRSA. Surgical pathology showed valve tissue with fibrosis and adherent vegetations containing numerous colonies of bacterial organisms (predominantly gram positive cocci with nonviable forms) consistent with endocarditis. She had her last dialysis session on 02/14/2021 with removal of her most recent left femoral temporary dialysis catheter at that time. She developed a rash around 02/15/2021 that raised concern for vancomycin-induced rash. 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 Chest radiographs 02/17/2021-02/23/2021 showed bilateral pleural effusions with partially loculated right pleural effusion reported as of 02/22/2021. The patient underwent right chest tube insertion on 02/23/2021 with immediate drainage of approximately 1 liter of bloody fluid consistent with hemothorax and subsequent removal on 02/25/2021. Right pleural fluid culture on 02/23/2021 had no growth with no organisms on Gram stain. The patient received vancomycin 02/06/2021-02/14/2021 prior to being transitioned to daptomycin (02/17/2021-present) and rifampin (02/16/2021-present). Plan is for a total 6-week antimicrobial course dated from negative blood cultures (end date 03/29/2021).  As clearance of bacteremia was not achieved preoperatively and operative cultures were positive, will follow the initial 6-weeks of treatment with doxycycline oral suppression for 6 months.  Pre-enrollment testing for the OPTIMAL Trial performed on 02/26/2021 included blood cultures X 2 sets that are negative; CT chest, abdomen, and pelvis without contrast that showed postoperative changes from tricuspid valve repair with loculated fluid along right heart border (similar appearance to right hemothorax  prior to chest tube drainage; felt to be postoperative fluid/small amount of bloody fluid), redemonstration of numerous septic emboli in bilateral lungs with improving groundglass and intralobular septal thickening, mild interval worsening of small right pleural effusion and right basilar consolidation with trace hydropneumothorax on right, and marked hepatosplenomegaly; and transthoracic echocardiogram that showed left ventricular ejection fraction 61.2% and tricuspid bioprosthesis with normal leaflet motion and no paravalvular tricuspid regurgitation.  Our Psychiatry/Substance Use Treatment evaluated the patient and assisted with transition to Suboxone on 02/27/2021.  The patient consented for enrollment in the [place] and has been randomized to the oral antimicrobial arm (beginning 03/02/2021).  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 Renita Papa  - Microbiology:                [facility]:                - 02/01/2021 Blood cultures X 2 pediatric sets: MRSA (vancomycin MIC 2, rifampin MIC <1, tetracycline MIC <4) in 2 of 2 bottles                - 02/02/2021 Blood cultures X 2 sets: MRSA in 2 of 4 bottles (both sets)                - 02/02/2021 Sputum culture: MRSA as well as yeast  and usual flora                - 02/04/2021 Blood cultures X 2 sets: MRSA in 2 of 4 bottles (both sets)                [facility]:                - 02/05/2021 BAL culture: 15,000 MRSA                - 02/05/2021 Blood cultures X 2 sets: MRSA (vancomycin MIC 1, daptomycin MIC 0.5, ceftaroline susceptible, rifampin MIC <=0.5, Linezolid MIC 2, tetracycline MIC <=1, doxycycline MIC <=0.5) in 2 of 2 sets                - 02/07/2021 Blood cultures X 2 sets: MRSA in 2 of 2 sets                - 02/09/2021 Blood cultures X 2 sets: MRSA in 2 of 2 sets                - 02/11/2021 Blood cultures X 2 sets: MRSA in 2 of 2 sets                - 02/13/2021 Blood cultures X 2 sets: MRSA in 2 of 2 sets                - 02/13/2021 Operative culture from tricuspid valve tissue: MRSA                - 02/13/2021 BAL culture: MRSA                - 02/14/2021 Blood cultures X 2 sets: MRSA in 2 of 2 sets                - 02/15/2021 Blood cultures X 2 sets: MRSA in 2 of 2 sets                - 02/16/2021 Blood cultures X 2 sets: No growth (clearance of bacteremia achieved)                - 02/23/2021 Right pleural fluid culture: No growth                - 02/25/2021 Blood cultures X 2 sets: Pending  - Pathology: 02/13/2021 Surgical pathology: Valve tissue with fibrosis and adherent vegetations containing numerous colonies of bacterial organisms (  predominantly gram positive cocci with nonviable forms) consistent with endocarditis  - Antimicrobial Therapy: Daptomycin 8 mg/kg IV Q24 hours + rifampin 300 mg PO BID  - Duration of antimicrobial therapy and end date: 6 weeks (2 weeks IV + 4 weeks PO) dated from negative blood cultures (02/16/2021 = day 1); end date 03/29/2021                - Transition to linezolid 600 mg PO BID and rifampin 300 mg PO BID for the last 4 weeks (03/02/2021-03/29/2021) of a total 6-week antimicrobial treatment course                - Following completion of the above regimen, transitioning to doxycycline oral  suppression (as of 03/30/2021) for 6 months  - Location of central access and date placed: PICC placed in right brachial vein on 02/21/2021 - remove prior to discharge  - Labs:  weekly CBC with differential, BUN, creatinine, total bilirubin, AST, ALT, alkaline phosphatase, and CRP after discharge (external orders placed)  - Additional medications: Aspirin 81 mg PO daily, bumetanide 1 mg IV daily, metoprolol tartrate 25 mg PO BID    Anticoagulation:  - Indication: Tricuspid valve replacement  - Medication/Therapy: Warfarin  - Current dose:  Maintain at 2 mg q.h.s.  - Anticoagulation needed at discharge: Yes - Will require 3 months postoperatively unless otherwise specified by Dr. Renita Papa  - Goal INR: 2  - Last INR:  1.47 on day of discharge  - Outpatient INR Monitoring: [place] [facility]; will need close monitoring of INR once rifampin is completed as warfarin dose will likely need decreased at that time    History of Substance Misuse:  - Diagnosis: Opioid use disorder, Stimulant use disorder, Alcohol use disorder in remission, Sedative-hypnotic use disorder in remission  - Substance(s) Used: Heroin, methamphetamine, prior alcohol, prior gabapentin  - Medical/Withdrawal Therapy: N/A  - Evaluation by Psychiatry: Yes  - Agreeable to individual therapy: No  - Agreeable to group therapy: No  - Agreeable to outpatient therapy: Yes  - Agreeable to medical alternative therapy: Yes -Suboxone 4-66m BID  - Agreeable to DDU: No  - Agreeable to [facility]: No  - [facility]  - Risk to discharge with PICC: High  - Bowel regimen: Senokot-S 2 tabs PO BID, Miralax 34 g daily    Screening:  - UDS (on admission): Not done  - UDS (most recent): 02/27/2021 Positive for buprenorphine and oxycodone (had just been given both while inpatient)  - Syphilis Screening: Negative  - Urine Gonorrhea/Chlamydia: Previously ordered, never collected  - Tdap: Declines  - SARS-CoV-2 (COVID-19) Vaccination: Declines  - SARS-CoV-2  (COVID-19) Screening: 02/27/2021 negative    Cleared hepatitis C   - Antibody Positive Date: 02/05/2021  - Quantitative RNA PCR Value: 02/05/2021 Target not detected  - HIV Screen: Negative  - Hep A IgG: Reactive (immune)  - Hep B Surface Ag: Negative  - Hep B Surface Ab: 497  - Hep B Core Ab: Negative    Right hemothorax - improved  - Status post right chest tube insertion on 02/23/2021 and removal on 02/25/2021    Idiopathic postoperative anemia - still present  - Most recently transfused PRBCs on 02/21/2021, 02/24/2021, and 02/27/2021    Acute kidney injury on possible CKD 3 - improving  - Continue sodium bicarbonate 650 mg PO TID  - Continue Nephrocaps 1 capsule PO daily    Leukocytoclastic vasculitis - improved  - Confirmed  by punch biopsy pathology on 02/16/2021  - Continue triamcinolone 0.1% topical ointment BID for now    Insomnia - improved  - Continue melatonin 5 mg PO nightly    Chronic Issues:   Unspecified anxiety and depressive disorders - improved  - Continue quetiapine 100 mg PO nightly    Tobacco use disorder - stable  - Counsel regarding smoking cessation this admission    Resolved Issues:   Dialysis requirement  Sepsis    DVT/PE Prophylaxis/Anticoagulation: Warfarin    Follow Up Appointments:  PCP/TCC: Set up a PCP in [place], [place], on discharge; follow-up in TCC 1 week after discharge if unable to establish with PCP that soon  Infectious Diseases: Telephone follow-up with Dr. Alfonzo Feller in [facility] on 03/15/2021 at 11:00 AM (being scheduled) as well as in-person follow-up 1 month after discharge on same day as appointment with Cardiac Surgery (will schedule before discharge)  Cardiac Surgery: Follow up with Dr. Renita Papa 1 month after discharge (will schedule before discharge)  Psychiatry: Follow up in [facility] (preferred if feasible) or [place] with release of information to obtain record of appointment attendance while completing treatment phase with oral  antimicrobial therapy  Nephrology: Janann Colonel 03/20/2021 at 09:15AM    Based upon clinical findings and examination, [name] [name] was deemed appropriate to discharge on 03/02/2021. The patient is medically stable and has no limitations or restrictions on activity. Any and all questions regarding the patient's recent hospitalization and medical issues were sufficiently answered as needed. Patient has been informed to keep all her follow up appointments, as listed below. Further management is at the discretion of her follow up medical providers; patient was informed to ask those providers regarding any other changes. Patient was instructed to call 911 or return to the nearest emergency department if her symptoms worsened or did not improve.    CONDITION ON DISCHARGE:  A. Ambulation: Full Ambulation  B. Self-care Ability: Complete  C. Cognitive Status: Alert and oriented x3  LINES/DRAINS/WOUNDS AT DISCHARGE:   Patient Lines/Drains/Airways Status     Active Line / Dialysis Catheter / Dialysis Graft / Drain / Airway / Wound     Name Placement date Placement time Site Days    PICC Double Lumen Lumen 1 Purple Lumen 2 Red Right;Brachial Vein Central 02/21/21  1115  4 FR  8    Surgical Incision Mediastinal Chest 02/13/21  --  -- 17    Surgical Incision Right Groin 02/13/21  --  -- 17              DISCHARGE DISPOSITION:  Home discharge    DISCHARGE INSTRUCTIONS:   Follow-up Information     Infectious Disease, Physician Office Center.    Specialty: Infectious Diseases  Contact information:  [address]  [place] [address]  [phone]  Additional information:  [facility] is our [place] are currently suspended due to COVID-19 restrictions at this [facility] outpatient clinic. We apologize for any inconvenience this may cause. Please ask an attendant for assistance if needed.           Care, [facility]. Go on 03/08/2021.    Specialty: EXTERNAL  Why: Hospital  discharge follow up at 10:15 am  Contact information:  [address]  [place] [address]  [phone]                          CBC/DIFF  Please fax results to [phone] ATTN: Dr. Caryl Asp Juskowich     BUN    Please fax results to [phone] ATTN: Dr. Hassie Bruce     CREATININE    Please fax results to [phone] ATTN: Dr. Hassie Bruce     BILIRUBIN TOTAL    Please fax results to [phone] ATTN: Dr. Caryl Asp Juskowich     AST (SGOT)    Please fax results to [phone] ATTN: Dr. Hassie Bruce     ALT (SGPT)    Please fax results to [phone] ATTN: Dr. Caryl Asp Juskowich     ALK PHOS (ALKALINE PHOSPHATASE)    Please fax results to [phone] ATTN: Dr. Hassie Bruce     C-REACTIVE PROTEIN(CRP),INFLAMMATION    Please fax results to [phone] ATTN: Dr. Hassie Bruce     PT/INR    Standing lab order for INR to be drawn daily prn.  Please FAX RESULTS TO [phone] [facility] Anticoagulation Clinic.     DISCHARGE INSTRUCTION - MISC    03/06/2021 2:00 PM Telephone follow-up in Transitions of [facility]   03/15/2021 11:00 AM Telephone follow-up in Infectious Diseases Clinic with Dr Alfonzo Feller  03/27/2021 1:15 PM In-person follow-up in Cardiac Surgery Clinic/HVI with Dr. Renita Papa   03/27/2021 2:00 PM In-person follow-up in Infectious Diseases Clinic with Dr Alfonzo Feller Plus Psychiatry follow-up     [place] IN INFECTIOUS DISEASE-POC    Please keep your telephone appointment with Dr. Hassie Bruce in [facility] on 03/15/2021 at 11:00 AM.  If for some reason you need to reschedule, please call [phone] as this is important for your care.     Department INFECTIOUS DISEASE-POC [[id number]]       Era Bumpers, MD PhD    Copies sent to Care Team       Relationship Specialty Notifications Start End    Care, [facility] PCP - General EXTERNAL  03/02/21     Dr. Michaelene Song    Phone: [phone] Fax: [phone]         [place] [address]    [facility] Team,  [facility] Poc/Utc Anticoagulation   03/01/21     Bary Leriche, [facility] Anticoagulation  Admissions 03/01/21         Referring providers can utilize [email or web address] to access their referred [place] patient's information.    Outpatient Antimicrobial Treatment Plan     Diagnosis Group:  Diagnosis:  Microorganisms:   Endocarditis/Endovascular infection MRSA native tricuspid valve endocarditis [facility]:  02/01/2021-02/04/2021 Blood cultures: MRSA (vancomycin MIC 2, rifampin MIC <1, tetracycline MIC <4)  02/02/2021 Sputum culture: MRSA, yeast, and usual flora  [facility]:  02/05/2021-02/15/2021 Blood cultures: MRSA (vancomycin MIC 1, rifampin MIC <=0.5, daptomycin MIC 0.5, ceftaroline susceptible, tetracycline MIC <=1, doxycycline MIC <=0.5)  02/05/2021 BAL culture: 15,000 MRSA  02/13/2021 Operative culture from tricuspid valve tissue: MRSA  02/13/2021 BAL culture: MRSA  02/16/2021 and 02/25/2021 Blood cultures: No growth   02/23/2021 Right pleural fluid culture: No growth       Antimicrobials:  Oral Antimicrobials: Dose: Frequency: Anticipated Stop Date: Comments:   Linezolid 600 mg Q12 hours 03/29/2021 Last 4 weeks of a total 6-week course   Rifampin 300 mg Q12 hours 03/29/2021 Last 4 weeks of a total 6-week course   Stop date reflects 6 weeks dated from clearance of bacteremia on 02/16/2021.  The patient is enrolled in the OPTIMAL Trial.  Following completion of the above regimen, will transition to oral  suppression with doxycycline 100 mg PO BID beginning 03/30/2021 and continue for 6 months.    Important Drug Interactions:   Rifampin may reduce the effect of warfarin - patient counseled, [facility] notified that her antimicrobial regimen will include rifampin, and INR will be closely monitored   Allergies:   Developed a rash on vancomycin - pathology from punch biopsy consistent with leukocytoclastic vasculitis       Labs Monitoring Plan/Orders:  Labs: Lab Frequency:  Additional Comments:   CBC with differential, BUN, creatinine, AST, ALT, alkaline phosphatase, total bilirubin, and CRP Weekly while on oral antimicrobial therapy External lab orders placed on discharge for the patient to undergo laboratory testing at [place] Pharmacist: Linden Dolin, PharmD Phone Number: [phone] Fax Labs To: [phone]     Physician Monitoring [facility] Course:   Dr. Hassie Bruce Phone Number: [phone] Fax Labs To: [phone]       ID Clinic Follow-Up: Dr. Hassie Bruce   Follow-Up Date: 03/15/2021 Follow-Up Time: 11:00 AM        Attending Physician:   Dr. Eduard Clos, M.D., 03/02/2021  Assistant Professor, Sections of Infectious Diseases and [place] Department of Medicine

## 2021-03-02 NOTE — Care Management Notes (Addendum)
[facility]  Care Management Note    Patient Name: [name] [name]  Date of Birth: 1985-11-25  Sex: female  Date/Time of Admission: 02/05/2021 12:34 AM  Room/Bed: 664/A  Payor: [place]cia [place] MEDICAID / Plan: [place]cia [place] MEDICAID / Product Type: Medicaid MC /    LOS: 25 days   Primary Care Providers:  Care, [facility] (General)    Admitting Diagnosis:  Endocarditis [I38]    Assessment:    03/02/21 [id number]   Assessment Details   Assessment Type Continued Assessment   Date of Care Management Update 03/02/21   Date of Next DCP Update 03/09/21   Care Management Plan   Discharge Planning Status plan in progress   Projected Discharge Date 03/02/21   Discharge plan discussed with: Patient   CM will evaluate for rehabilitation potential yes   Patient choice offered to patient/family no   Form for patient choice reviewed/signed and on chart no   Patient aware of possible cost for ambulance transport?  No   Discharge Needs Assessment   Equipment Currently Used at Home none   Equipment Needed After Discharge none   Discharge Facility/Level of Care Needs Home (Patient/Family Member/other)(code 1)   Transportation Available family or friend will provide;car   Referral Information   Admission Type inpatient     Per service the patient is discharge ready. The patient discharging to home. She is enrolled in the [place] (03/02/2021-03/29/2021. Linezolid and Rifampin (approved by supervisor K. Z on 02-27-2021. Comfort form was completed).       The patients insurance termed on 02-14-2021.   The patient has been prescribed Renvela ( pt states she cannot afford this med) Pt. Price $206.50, CM cost $37.09  (approved by supervisor S.B.) to be covered by CM (see comfort form in All script).     The patient states she can afford all of her other meds. They will be sent home with her in a paper form for her to take to her home pharmacy [facility] in [place] .     The patient states she has transport home via family.     The  patient is aware she will need to purchase her own shower chair. She states her family can assist her at home as needed.     Per LPN [name] the patient has new PCP appointment on 03/08/21 Dr. [name] at [facility] in [place].    [address] [name] made psych [name]) aware that the Suboxone script will need to be called to [facility] in [place].       The patient states she is going to go by [name] on her way home to have her Medicaid re-activate.     CCC will continue to follow.     Discharge Plan:  Home (Patient/Family Member/other) (code 1)    The patient will continue to be evaluated for developing discharge needs.     Case Manager: Olin Hauser, [place] COORDINATOR  Phone: [phone]

## 2021-03-02 NOTE — Nurses Notes (Signed)
Patient discharged to home with oral Abxs. PICC removed by Charge nurse. Sterile gauze,Bacitracin ointment and  Transparent dressing applied to insertion site. Pt instructed to leave a dressing in place for 24 hours. Pt verbalized understanding.   Reviewed AVS, medications and future appointments. Pt will be pick up 3 medications in discharge pharmacy and gave 9 printed scripts to patient for pick up her pharmacy today.

## 2021-03-02 NOTE — Consults (Signed)
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:  02/05/2021-02/15/2021 Blood cultures: MRSA (vancomycin MIC 1, rifampin MIC <=0.5, daptomycin MIC 0.5, ceftaroline susceptible, tetracycline MIC <=1, doxycycline MIC <=0.5)  02/05/2021 BAL culture: 15,000 MRSA  02/13/2021 Operative culture from tricuspid valve tissue: MRSA  02/13/2021 BAL culture: MRSA  02/16/2021 and 02/25/2021 Blood cultures: No growth   02/23/2021 Right pleural fluid culture: No growth       Antimicrobials:  Oral Antimicrobials: Dose: Frequency: Anticipated Stop Date: Comments:   Linezolid 600 mg Q12 hours 03/29/2021 Last 4 weeks of a total 6-week course   Rifampin 300 mg Q12 hours 03/29/2021 Last 4 weeks of a total 6-week course   Stop date reflects 6 weeks dated from clearance of bacteremia on 02/16/2021.  The patient is enrolled in the OPTIMAL Trial.  Following completion of the above regimen, will transition to oral suppression with doxycycline 100 mg PO BID beginning 03/30/2021 and continue for 6 months.    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 Hassie Bruce Phone Number: [phone] Fax Labs To: [phone]       ID Clinic Follow-Up: Dr. Hassie Bruce   Follow-Up Date: 03/15/2021 Follow-Up Time: 11:00 AM        Attending Physician:   Dr. Eduard Clos, M.D., 03/02/2021  Assistant Professor, Sections of Infectious Diseases and [place] Department of Medicine

## 2021-03-02 NOTE — Transitional Care (Signed)
[facility] Medicine  Transitional Care Coordination      Initial Assessment  Name: Kassadee Laramore  Address: [address]  [place] [address]  Date of Birth: June 10, 1986 35 y.o.  Date of Service: 03/02/2021  Lay Caregiver: [place] Caregiver Name: Nicholos Johns Caregiver Contact Number: [phone], Lay Caregiver Relationship to patient: parent         1.  Obtained consent to contact and schedule a PCP follow up in 7-10 days per PCP request. Yes to tele health with the TCC and to assisting in finding a new PCP.  Prefers [facility] since they are the closest to her home.  2.  PCP listed as No Pcp. This is correct per pt.   Last visit: na.  3.  Patient appointment preferences: any  4.  Transportation to healthcare appointments: states that she has transportation  5.  Patient MyChart status: pend  6. Preferred method of contact: [phone] mobile or mother's phone number  7.  Confirmed patient contact information:   Home Phone [phone]   Work Phone [phone]   Mobile [phone]     8.  Obtained consent to speak with  lay caregiver: yes  9.  Confirmed patient address listed above:  No, will update  10.  Confirmed Pharmacy:   Will update    Spoke with patient to discuss provider of primary care services.  Discussed role of Transitional Care Coordination Team and informed of contact within 2 business days of discharge to assess follow-up plan.    Lesly Dukes, LPN  [id number], [id number]

## 2021-03-02 NOTE — Care Management Notes (Signed)
Care Coordinator/Social Work [place]  Patient Name: [name] [name]   MRN: [id number]   Acct Number: [id number]  DOB: [birth year] Age: 35  **Admission Information**  Patient Type: INPATIENT  Admit Date: 02/05/2021 Admit Time: 00:34  Admit Reason: Endocarditis  Admitting Phys: Lyla Son  Attending Phys: Georgina Peer  Unit: R-6E Bed: 664-A  155. Universal Comfort Form  Created by : Olin Hauser Date/Time 2021-03-02 09:32:35.000  Universal Comfort Form  Has the patient received assistance in the last 12 months? No  Yes (please note type and when in note box) 02-27-2021 po  abx. (same admission as this [facility])   Type of Assistance for this request Medication/Perscription   Reason for Assistance No Perscription Coverage   Resource being utilized Prescription- POC Pharmacy   Number of members in your household 1   Do you have Medical insurance source? No   Do you have Prescription insurance source? No   Have you applied for Medicaid? Yes   Have you applied for [facility]? No   Medicaid Verified No   Have Family or Friends been contacted for assistance? Yes   Do you utilize support agencies for assistance (i.e. [employer] Right, [employer])? No   Are you employeed? No   Do you have a checking account? Yes $0-$500   Do you have a savings account? Yes $0-$500   Gross Monthly Net Income (take home after deductions) $0-$500   Gross Monthly Expenses $0-$500   Patient eligible for assistance Yes Approved by Supervisor Violeta Gelinas

## 2021-03-02 NOTE — Nurses Notes (Signed)
Escorted off the unit by Kennyth Lose ADS with personal belongings to discharge pharmacy to pick up medications. Pt will leave to home with family member in personal transportation. PICC removal site dressing is CDI.

## 2021-03-03 LAB — ADULT ROUTINE BLOOD CULTURE, SET OF 2 BOTTLES (BACTERIA AND YEAST)
BLOOD CULTURE, ROUTINE: NO GROWTH
BLOOD CULTURE, ROUTINE: NO GROWTH

## 2021-03-05 ENCOUNTER — Telehealth (HOSPITAL_COMMUNITY): Payer: Self-pay | Admitting: Internal Medicine

## 2021-03-05 ENCOUNTER — Ambulatory Visit (INDEPENDENT_AMBULATORY_CARE_PROVIDER_SITE_OTHER): Payer: Self-pay

## 2021-03-05 ENCOUNTER — Telehealth (HOSPITAL_COMMUNITY): Payer: Self-pay

## 2021-03-05 ENCOUNTER — Other Ambulatory Visit: Payer: Self-pay

## 2021-03-05 DIAGNOSIS — Z954 Presence of other heart-valve replacement: Secondary | ICD-10-CM

## 2021-03-05 LAB — ENTER/EDIT OPAT LABS
ALT (SGPT): 17
AST (SGOT): 30
BASOPHILS: 1.5
BILIRUBIN, TOTAL: 1.1
BUN: 59
C-REACTIVE PROTEIN (CRP),INFLAMMATION: 43 mg/L — AB (ref 0–8)
CREATININE: 3.3
EOSINOPHIL: 0.8
HCT: 23.5
HGB: 8
INR: 1.4
LYMPHOCYTES: 29.6
MONOCYTES: 8
PLATELET COUNT: 222
PMN ABS: 3.7
PMN'S: 60.1
PROTHROMBIN TIME: 15
WBC: 6.2

## 2021-03-05 NOTE — Nursing Note (Signed)
[facility] / Department of Pharmaceutical Services   Antithrombotic Stewardship Program      Called patient to complete warfarin (Coumadin) education with Patient. Discussed details of "Blood Thinner Pills" booklet with patient:   - Patient's specific indication for warfarin   - Take warfarin at same time every evening  - What to do if a dose was missed or taken incorrectly  - Patient's specific INR goal  - Informed of expectations for INR monitoring  - Medications interactions and what to do when a medication is started or stopped  o For pain, avoid NSAIDs if possible, use acetaminophen (speak to PCP about pain options in liver disease)  - Food interactions with items containing higher amounts of vitamin K  o Avoid eating liver and be consistent green leafy vegetables eaten from week to week  o Avoid and/or minimize alcohol intake  - How to handle taking warfarin or extra monitoring with acute illness    - Minor v. major side bleeding and when to seek immediate medical attention     Patient did verbalize good understanding      If concerns for understanding, what needs reinforced:   N/A    Are there any barriers to picking up warfarin (cost or access to medication)?:  no    Notes or required follow up:   N/A    Krislyn Donnan, RN

## 2021-03-05 NOTE — Pharmacy (Signed)
[place] / Department of Pharmaceutical Services   Antithrombotic Stewardship Program    New Start Warfarin Therapy-Transitions of Care     Name: [name] [name] MRN:  [id number]   Date: 02/04/2021 Age: 35 y.o.       Date of Admission: 02/05/2021    Primary Contact: Patient    Primary Contact Phone Number:  [phone]      Alternate Contact:  Zenda Alpers - Mother  (Name and Relationship)    Alternate Contact Phone Number: [phone]     Prescribed Oral Anticoagulant Regimen:     warfarin (COUMADIN) 2 mg Oral Tablet 14 Tablet 0 03/02/2021 03/16/2021   Sig: Take 1 Tablet (2 mg total) by mouth Every night for 14 days         New initiation of oral anticoagulant? New Start    Current outpatient primary care provider (PCP):   NONE     Approximate last PCP visit: --    Insurance Provider: NONE    Prior Authorization Required? No    Medication Cost: $ 6.00 - 14/14ds    Patient Assistance Required? No    Preferred Pharmacy: [facility] [place], [place]    Pamelia Hoit, Pharmacy Technician  03/05/2021, 08:28

## 2021-03-05 NOTE — Progress Notes (Signed)
Anticoagulation Encounter Note:    TCC to manage until established with PCP (orders under Joy Juskowich until seen in TCC)   Indication: bioprosthetic tricuspid valve replacement  INR Goal: 1.8-2.2    INR: no INR today yet - f/u from discharge    Current Dose: 2 mg daily on discharge    Clinical Outcomes     Positives:  Exclude INR (no INR)          Plan: Spoke w/pt to confirm INR check date - she says she will go to lab today. Confirms she has paper standing INR order. Advised her I will f/u once result is received.    Next INR Date: 9/19    Fatoumata Albaugh A. Eugenia Pancoast, PharmD, [place], [place] Specialist, [facility]  Phone: [phone] / [phone]      For additional information, see dosing calendar below.    Warfarin Therapy Instructions   September 2022 Details    Sun Mon Tue Wed Thu Fri Sat         1               2      2.5 mg         3   1.30   2.5 mg   See details        4   2.59   1 mg   See details      5   2.42   2 mg   See details      6   3.67   Hold   See details      7   3.88   Hold   See details      8   2.53   1 mg   See details      9   2.74   0.5 mg   See details      10   2.28   0.5 mg   See details        11   1.65   Hold   See details      12   1.61   1 mg   See details      13   1.45   1 mg   See details      14   1.47   2 mg   See details      15   1.42   Hold   See details      16   1.47   2 mg   See details      17      2 mg           18      2 mg         19         See details      '20               21               22               23               24                 25               '$ 26  $'27               28               29               30                 'A$ Date Details   09/03 INR: 1.30      09/04 INR: 2.59   Additional INRs: 2.02      09/05 INR: 2.42      09/06 INR: 3.67   Additional INRs: 3.13      09/07 INR: 3.88      09/08 INR: 2.53      09/09 INR: 2.74      09/10 INR: 2.28      09/11 INR: 1.65      09/12 INR: 1.61       09/13 INR: 1.45      09/14 INR: 1.47      09/15 INR: 1.42      09/16 INR: 1.47      09/19 This INR check       Date of next INR:  03/05/2021

## 2021-03-05 NOTE — Consults (Signed)
Patient Name: [name] [name]  Date of Birth: [birth year]  MRN: [id number]    Date of Service: 03/05/2021  Called in following script to [facility] at [phone].   Suboxone Films  8-'2mg'$   1 SL daily  Dispense 7  No refills.  Jerlyn Ly, CASE MANAGER  03/05/2021  11:48  Psychiatry Consults

## 2021-03-06 ENCOUNTER — Encounter (INDEPENDENT_AMBULATORY_CARE_PROVIDER_SITE_OTHER): Payer: Self-pay | Admitting: Student in an Organized Health Care Education/Training Program

## 2021-03-06 ENCOUNTER — Telehealth (INDEPENDENT_AMBULATORY_CARE_PROVIDER_SITE_OTHER): Payer: Self-pay | Admitting: Pharmacist

## 2021-03-06 ENCOUNTER — Ambulatory Visit (INDEPENDENT_AMBULATORY_CARE_PROVIDER_SITE_OTHER): Payer: Self-pay

## 2021-03-06 ENCOUNTER — Other Ambulatory Visit: Payer: Self-pay

## 2021-03-06 ENCOUNTER — Ambulatory Visit: Payer: MEDICAID | Attending: Student in an Organized Health Care Education/Training Program | Admitting: Family

## 2021-03-06 DIAGNOSIS — N1831 Chronic kidney disease, stage 3a: Secondary | ICD-10-CM | POA: Insufficient documentation

## 2021-03-06 DIAGNOSIS — Z09 Encounter for follow-up examination after completed treatment for conditions other than malignant neoplasm: Secondary | ICD-10-CM | POA: Insufficient documentation

## 2021-03-06 DIAGNOSIS — Z954 Presence of other heart-valve replacement: Secondary | ICD-10-CM

## 2021-03-06 DIAGNOSIS — F1910 Other psychoactive substance abuse, uncomplicated: Secondary | ICD-10-CM | POA: Insufficient documentation

## 2021-03-06 DIAGNOSIS — K5900 Constipation, unspecified: Secondary | ICD-10-CM | POA: Insufficient documentation

## 2021-03-06 DIAGNOSIS — N179 Acute kidney failure, unspecified: Secondary | ICD-10-CM | POA: Insufficient documentation

## 2021-03-06 NOTE — Progress Notes (Signed)
Anticoagulation Encounter Note:    TCC to manage until established with PCP (orders under Joy Juskowich until seen in TCC)   Indication: bioprosthetic tricuspid valve replacement  INR Goal: 1.8-2.2    INR: 1.4    Current Dose: 2 mg daily since discharge; in past 7 days has had 11 mg (avg 1.5 mg/day)    Clinical Outcomes     Positives:  Exclude INR (first INR out of hospital)          Plan: Increase to 3 mg today and tomorrow. Per handoff given at discharge, patient does not need to bridge for low INRs. Pt reports she has been taking her doses in the morning so she already took 2 mg today. For today only, advised her to take 1 mg this evening then starting tomorrow take dose in PM. She has new PCP establish care visit on Thursday, 9/22, and PCP should be taking over management of warfarin at that time. Asked her to discuss with new PCP when they would like her to recheck INR - I would likely have recommended recheck Friday but will no longer be managing by that point.    Spoke with [facility] office (phone [phone]). Confirmed that patient has establish care visit with Dr. Levada Dy 9/22 at 10:15am. Confirmed that Dr. Levada Dy WILL manage warfarin for patient moving forward. Fax number for records [phone].    Next INR Date: TBD by new PCP, f/u Thursday 9/22    Adenike Shidler A. Eugenia Pancoast, PharmD, [place], [place] Specialist, [facility]  Phone: [phone] / [phone]      For additional information, see dosing calendar below.    Warfarin Therapy Instructions   September 2022 Details    Sun [place]li Knock Tue Wed Thu Fri Sat         '1               2               3                 4               5               6               7               8               9               10                 11               12               13               14               15               16               17                 18               19   '$ 1.4  2 mg   See details       20      3 mg   See details      21      3 mg         '22               23               24                 25               26               27               28               29               30                 '$ Date Details   09/19 Last INR check   INR: 1.4      09/20 This INR check       Date of next INR:  03/08/2021

## 2021-03-06 NOTE — Progress Notes (Signed)
INTERNAL MEDICINE, PHYSICIAN OFFICE CENTER  [place] [address]  Operated by [facility]  Telephone Visit    Name:  [name] [name] MRN: [id number]   Date:  03/06/2021 Age:   35 y.o.     The patient/family initiated a request for telephone service.  Verbal consent for this service was obtained from the patient/family.    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    She is s/p tricuspid valve replacement with closure of patent foramen ovale on 02/13/2021.  She was started on warfarin with recommendations to complete anticoagulation for 3 months postop.    She was on Vancomycin until 02/14/2021 when she developed a rash.  Dermatology was consulted and punch biopsy showed leukocytoclastic vasculitis-she was started on topical triamcinolone ointment. IV antibiotics were changed to rifampin and daptomycin.  At the time of discharge she was transition to oral linezolid and rifampin with completion date tentatively set for 03/29/2021.  It was also recommended that patient transition to oral doxycycline X 6 months for oral suppression following completion of oral linezolid and rifampin. Last dialysis session was 02/15/2021       Regarding management of substance abuse disorders, Psychiatry was consulted and [name] [name] was started on Suboxone         Since discharge, she is doing ok. Taking her medications including antibiotics as prescribed. INR 1.4 today and goal is 1.8-2.2 per cardiac surgery. She is complaining of some constipations. Hasn't been taking Miralax or senna since leaving the hospital.  She denies CP, dyspnea, fever, chills, n/v/d or abdominal pain. She does notice a little more swelling since leaving the hospital, but it does go away when she elevates her legs. No orthopnea. No s/s of bleeding. She will see her PCP on Thursday and is scheduled to establish with a Suboxone clinic tomorrow. She denies illicit drug use or alcohol use since leaving the hospital. She reports good urine output, but has not been staying very well hydrated.          ICD-10-CM    1. Hospital discharge follow-up  Z09 Comprehensive Metabolic Panel, Nf     Magnesium     Phosphorus     Urinalysis (Routine)   2. S/P TVR (tricuspid valve replacement)  Z95.4    3. Polysubstance abuse (CMS HCC)  F19.10    4. Stage 3a chronic kidney disease (CMS HCC)  N18.31 Comprehensive Metabolic Panel, Nf     Magnesium     Phosphorus     Urinalysis (Routine)   5. Constipation, unspecified constipation type  K59.00    6. AKI (acute kidney injury) (CMS HCC)  N17.9 Comprehensive Metabolic Panel, Nf     Magnesium     Phosphorus     Urinalysis (Routine)        -she will continue antibiotics including linezolid and rifampin as prescribed until completion on 03/29/2021.  At that time she is to start doxycycline for suppressive therapy X 6 months   -has follow-up scheduled with infectious disease clinic on 03/15/2021  -will also follow-up with Cardiac surgery on 03/27/2021  -continue warfarin as  prescribed.  TCC pharmacist assisting with warfarin management until patient establishes care with a new PCP on 03/08/2021.  It was confirmed with the patient's PCP office that they are willing to manage the patient's warfarin.  INR was 1.4.  She has been advised to take 3 mg today and tomorrow and will have next INR drawn on Thursday at her PCP appointment.  -regarding the patient's CKD and increasing creatinine.  Reviewed labs with patient that showed creatinine went from 1.7 for up to 3.3.  She does admit that she has not been staying well hydrated, but denies  any over-the-counter medication use including NSAIDs and denies illicit substance use.  Advised patient to increase her hydration and we will check CMP, magnesium, phosphorus and urine studies in 2 days.  Orders will be sent to [facility].  She verbalizes understanding  -also counseled patient that if she develops worsening swelling, dyspnea, decreased urine output, that she should seek immediate, in-person medical evaluation   -TCC call-back number was also provided to the patient   -regarding her constipation, she will start MiraLax daily.  Encouraged patient to stay well hydrated while on this medication  -discharge summary was reviewed with the patient   -medication reconciliation was completed by TCC pharmacist    Total provider time spent with the patient on the phone:  22 minutes and over half that time was spent on counseling and coordination of care.    [name] Headings, APRN,NP-C  03/06/2021, 15:45        The patient's plan of care was mutually developed and agreed upon with cosigning physician    I discussed the plan of care with [name] Headings, APRN. Patient noted to have cr of 3.3 on most recent blood work. She denies using any nephrotoxic medications. Med rec was reviewed in detail with the patient. Going to establish with her PCP in 2 days. Recommend checking CMP and UA at that visit as patient has been gaining weight. INR noted, patient spoke with [facility] pharmacy team about recommended warfarin dosing and when to re check INR. Patient is going to f/u with ID and cardiac surgery within the next few weeks.   Heron Nay, MD

## 2021-03-06 NOTE — Telephone Encounter (Signed)
Transition of Care Contact Information  Discharge Date: 03/02/2021  Transition Facility Type--Hospital (Inpatient or Observation)  Facility Name  Interactive Contact(s): Completed or attempted contact indicated by Date/Time  First Attempt Call: 03/05/2021 10:41 AM  Second Attempted Contact: 03/06/2021 11:38 AM  Contact Method(s)-- Patient/Caregiver Telephone  Clinical Staff Name/Role who contacted  Transition Note:   Message left for patient to please return call for hospital discharge follow up.  Contact information reviewed. Lesly Dukes, LPN  [id number], [id number]  Attempted TOC follow up.  Lesly Dukes, LPN  [id number], [id number]       Further information may be documented in relevant telephone or outreach encounter.

## 2021-03-06 NOTE — Telephone Encounter (Signed)
Internal Medicine - Transition of Care Clinic  Telemedicine Visit  Pharmacy Note    [name] [name] is a 35 y.o. female recently discharged from hospital after admission for multiple issues including PNA, bacteremia, endocarditis, and AKI. Called patient to complete medication reconciliation prior to Transitions of Care Clinic Telephone Visit/MyChart Visit with TCC provider scheduled for today.    Subjective     Patient reviewed meds from memory with decent recall of meds/indications/doses. Confirms taking abx as prescribed and says she has not missed any doses. Denies N/V/D or other noted side effects. Notes some swelling in her lower extremities; a little worse than at time of discharge but no substantial change over the past couple of days. Questions if she could be prescribed something like furosemide to help with this. Reports that elevation does help relieve the swelling.  She was without her Suboxone from time of discharge to yesterday (med not sent to correct pharmacy for pt to be able to obtain) - this was remedied yesterday. She reports she went to her psychiatry appt today and they plan to do her Suboxone intake over the phone tomorrow and ensure she is enrolled prior to running out of her 7 day prescription.   Not having as regular BMs as she was inpatient. Did not start Miralax/senna/docusate on discharge (OTC) but willing to do so.    Confirmed patient's preferred pharmacy: [facility], [place], [place]      Medications     Medications were reviewed. Medication reconciliation was completed using Epic records and verbal responses from patient. Patient/caregiver denies use of any other prescription, OTC, or herbal medications not listed in medication list below unless otherwise noted. Please see list for additional medication comments.    Reconciled Home Medication List   Current Outpatient Medications    Medication Sig Pharmacist Comments   . aspirin 81 mg Oral Tablet, Chewable Chew 1 Tablet (81 mg  total) Once a day for 30 days    . B complex-vitamin C-folic acid (NEPHROCAPS) 1 mg Oral Capsule Take 1 Capsule by mouth Once a day for 30 days    . buprenorphine-naloxone (SUBOXONE) 8-2 mg Sublingual Film Place 1 Film under the tongue Once a day Updated to 8-2 films as per last psych note   . famotidine (PEPCID) 20 mg Oral Tablet Take 1 Tablet (20 mg total) by mouth Once a day for 30 days    . hydrOXYzine pamoate (VISTARIL) 50 mg Oral Capsule Take 1 Capsule (50 mg total) by mouth Four times a day as needed for Anxiety Has not taken since discharge   . linezolid (ZYVOX) 600 mg Oral Tablet Take 1 Tablet (600 mg total) by mouth Twice daily for 28 days Anticipated end date 10/13   . melatonin 3 mg Oral Tablet Take 2 Tablets (6 mg total) by mouth Every night for 30 days    . metoprolol tartrate (LOPRESSOR) 25 mg Oral Tablet Take 1 Tablet (25 mg total) by mouth Twice daily for 30 days    . QUEtiapine (SEROQUEL) 50 mg Oral Tablet Take 1 Tablet (50 mg total) by mouth Every night for 30 days    . rifAMPin (RIFADIN) 300 mg Oral Capsule Take 1 Capsule (300 mg total) by mouth Once a day for 28 days Anticipated end date 10/13   . sevelamer Carbonate (RENVELA) 0.8 gram Oral Powder in Packet Take 3 Packets (2,400 mg total) by mouth Three times daily with meals    . sodium bicarbonate 650 mg Oral Tablet Take  2 Tablets (1,300 mg total) by mouth Three times a day for 30 days    . warfarin (COUMADIN) 2 mg Oral Tablet Take 1 Tablet (2 mg total) by mouth Every night for 14 days        Medications Discontinued During This Encounter   Medication Reason   . buprenorphine-naloxone (SUBOXONE) 2-0.5 mg Sublingual Film Medication Reconciliation   . polyethylene glycol (MIRALAX) 17 gram Oral Powder in Packet Patient states no longer taking   . sennosides-docusate sodium (SENOKOT-S) 8.6-50 mg Oral Tablet Patient states no longer taking       Medication Use:  Medications are managed by: patient  Adherence device used: not asked  Patient states  medications are affordable: no current issues  How often do you miss doses of your medications? No missed doses since discharge  Did patient obtain any new prescribed medications from pharmacy? yes  Adequate supply of all medications? yes      Objective     Relevant Labs/Calculations:  Calculated CrCl = ~21 mL/min (Ideal body weight)      Assessment and Recommendations     1. Medication list is up-to-date and has been reconciled with inpatient discharge list.    2. Anticoagulation - See separate anticoagulation encounter from today.    3. Monitoring - From OPAT labs obtained yesterday, SCr has increased from 1.74 to 3.3. Consider checking BMP+mag+phos given rise in SCr. She is on sevelamer and sodium bicarb. Would likely not recommend starting furosemide for lower extremity edema with current increasing SCr - consider compression stocking/elevation.    4. Bowel regimen - Discussed use of Miralax and/or senna+/-docusate with patient to maintain regular BMs, particularly with taking Suboxone. Pt verbalized understanding.    5. Health Maintenance  . Patient due for COVID, Td, and PCV15/20 vaccines based on available records. Not addressed at [facility] visit. Recommend patient follow-up with PCP/retail pharmacy to obtain remaining needed vaccine(s).    Information obtained from this visit and recommendations noted above will be discussed with TCC provider(s) prior to patient's telemedicine visit unless otherwise noted. Some recommendations may be deferred to patient's PCP or specialist providers.   Refer to the provider's note from today's TCC visit for the finalized plan for this patient.    Total time spent on call with patient: 13 minutes    Tasheema Perrone A. Eugenia Pancoast, PharmD, [place], [place] Specialist, [facility]  Phone: [phone] / [phone]

## 2021-03-06 NOTE — Progress Notes (Signed)
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  She is currently receiving linezolid 600 mg PO BID and rifampin 300 mg PO BID for the last 28 days of a total 42-day antimicrobial course dated from negative blood cultures (end date 03/29/2021).  Following completion of this regimen, she will begin oral suppression with doxycycline 100 mg PO BID as of 03/30/2021 and continue it for 6 months.  She is enrolled in the OPTIMAL Trial.    COpAT labs and INR from 03/05/2021 were reviewed.  Platelet count is 222,000 (stable since transitioning to her current antimicrobial regimen including linezolid).  Creatinine increased at 3.3 (previously had acute kidney injury/acute renal failure requiring dialysis followed by improvement while inpatient).  Liver enzymes remain normal.  CRP is 43 (overall stable since last checked).  INR is slightly below goal (also overall stable) and being closely followed by [facility] Anticoagulation team.  [name] [name] will have COpAT labs repeated one week after hospital discharge on 03/09/2021.  Will follow closely for result of creatinine among other laboratory tests at that time.    The patient attended her follow-up visit in [facility]itions of [facility] [facility] today.  According to documentation from that appointment, she will establish care with a PCP on 03/08/2021 and also establish care in a Suboxone Clinic.  She has an upcoming telephone visit in Infectious Disease Clinic with me on 03/15/2021 at 11:00 AM.    [name] [name], M.D., 03/06/2021  Assistant Professor, Sections of Infectious Diseases and [place] Department of Medicine

## 2021-03-07 ENCOUNTER — Encounter (INDEPENDENT_AMBULATORY_CARE_PROVIDER_SITE_OTHER): Payer: Self-pay | Admitting: Family

## 2021-03-07 ENCOUNTER — Encounter (INDEPENDENT_AMBULATORY_CARE_PROVIDER_SITE_OTHER): Payer: Self-pay | Admitting: Clinical

## 2021-03-07 NOTE — Nursing Note (Signed)
Faxed patient records to patient's PCP, Dr. Erin Hearing (fax: [phone], phone: [phone]). Also contacted patient's PCP and provided a warm handoff.    Patient has an upcoming appointment with PCP on 9.23.22.    Laurieann Friddle, [place]  03/07/2021, 09:53    Lab orders faxed to [facility]: CMP, UA, Phosphorus, Magnesium; fax confirmation received.    [place], CCMA  03/07/2021, 09:55

## 2021-03-07 NOTE — Progress Notes (Signed)
Per chart review, patient currently does not have medical insurance.     Spoke with patient on the phone [phone]) for the purpose of providing information on community resources.     Patient indicated that she has applied to have [facility] reinstated, and she anticipates approval.  Advised patient to please contact [facility] if she does not receive approval within the next 2 weeks.  Also, encouraged patient to contact TCC at [phone] with any issues/concerns.  Patient verbalized understanding.     Will continue to follow as needed.     Chizaram Bigg was provided with information about the following community resources:  [employer] / Uninsured / [facility]     [place], [place]  [id number], [id number]

## 2021-03-08 ENCOUNTER — Encounter (INDEPENDENT_AMBULATORY_CARE_PROVIDER_SITE_OTHER): Payer: Self-pay

## 2021-03-08 NOTE — Progress Notes (Signed)
Anticoagulation Encounter Note:  [facility] Anticoagulation Clinic - Warfarin - No longer following    Why is [facility] no longer following patient? Pt established with PCP 03/08/21    If remaining on warfarin:   Warfarin now managed by: [facility] - Dr. Modesta Messing with patient to confirm change in warfarin/management of warfarin.  She reports that PCP today got all her INR results and is awaiting result from today. TCC signing off on anticoagulation management at this time.    Keiran Gaffey A. Daeton Kluth, PharmD, [place], [place] Specialist, [facility]  Phone: [phone] / [phone]    [facility] Anticoagulation Un-Enrollment Procedures:  Anticoag Enrollment resolved   '[x]'$    Standing INR order discontinued per protocol   '[x]'$    Removed self from care team   '[x]'$    Removed Anticoag Team, [facility] Poc/Utc from care team   '[x]'$    Specialty comments edited to indicate treatment discontinued/[facility] anticoag no longer following   '[x]'$    Patient removed from active [facility]tico[facility] Warfarin Patients Epic List   '[x]'$    Patient removed from [place] Team Warfarin Tracking Spreadsheet   '[x]'$    Patient documented as stop on [facility]tico[facility] monthly warfarin report   '[x]'$    Confirm warfarin is removed from Epic medication list (if applicable)   '[x]'$ n/a

## 2021-03-09 ENCOUNTER — Encounter (INDEPENDENT_AMBULATORY_CARE_PROVIDER_SITE_OTHER): Payer: Self-pay | Admitting: Student in an Organized Health Care Education/Training Program

## 2021-03-09 LAB — ENTER/EDIT OPAT LABS
ALKALINE PHOSPHATASE: 147
ALT (SGPT): 30
AST (SGOT): 38
BILIRUBIN, TOTAL: 1.1
BUN: 51
CREATININE: 3.3
POTASSIUM: 4

## 2021-03-09 NOTE — Progress Notes (Signed)
Complex Oral Outpatient Antimicrobial Therapy Note (COpAT):    Patient was discharged from [facility] to home on 03/02/2021 to complete the following oral antimicrobial treatment course.     Indication: MRSA native tricuspid valve endocarditis  Antimicrobial(s): Linezolid 600 mg PO twice daily and rifampin 300 mg PO twice daily (as part of OPTIMAL trial); following completion, will transition to oral suppression with doxycycline 100 mg PO twice daily for 6 months  End date: 03/29/2021  ID Clinic follow up date: telephone visit on 03/15/2021    Spoke with patient on 03/09/2021, patient is feeling well and tolerating PO regimen. She was on her way to have lab work performed at [facility] when we spoke.     Patient endorses 0 symptoms  Patient endorses 0 side effects  Patient states they have missed 0 doses of above antimicrobial(s).     Patient was was given order for COpAT labs prior to discharge.   Explained to patient the need for labs to monitor treatment response. Patient expressed understanding.  Patient states they will plant to have labs obtained on 03/09/2021 at [facility].     Provided patient with phone number and extension to call with any concerns.     Reminded patient of ID follow up appointment on 03/15/2021.    Linden Dolin, PharmD  Infectious Diseases Clinical Pharmacist  OPAT/Antimicrobial Stewardship  [phone] Ext. [phone]    I reviewed the COpAT note and agree with the findings and plan of care as documented in the note.  Any exceptions/additions are edited/noted.  Results of laboratory tests from 03/05/2021 were previously reviewed when platelet count was stable, liver enzymes remained normal, and CRP was overall stable versus recent values.  Will continue to follow results of laboratory tests.  The patient is scheduled for a telephone visit in [facility] with me on 03/15/2021 at 11:00 AM.    Evangeline Dakin, M.D., 03/11/2021  Assistant Professor, Sections of  Infectious Diseases and [place] Department of Medicine

## 2021-03-12 ENCOUNTER — Telehealth (INDEPENDENT_AMBULATORY_CARE_PROVIDER_SITE_OTHER): Payer: Self-pay | Admitting: Student in an Organized Health Care Education/Training Program

## 2021-03-12 ENCOUNTER — Encounter (INDEPENDENT_AMBULATORY_CARE_PROVIDER_SITE_OTHER): Payer: Self-pay | Admitting: Student in an Organized Health Care Education/Training Program

## 2021-03-12 MED ORDER — BUMETANIDE 2 MG TABLET
3.0000 mg | ORAL_TABLET | Freq: Two times a day (BID) | ORAL | 0 refills | Status: AC
Start: 2021-03-12 — End: 2021-03-26

## 2021-03-12 NOTE — Telephone Encounter (Signed)
Brief Infectious Diseases/COpAT Note    Available results of Ms. Mcgreal's COpAT labs as well as urinalysis with culture from 03/09/2021 were reviewed.    Creatinine elevated at 3.3 (increased since hospital discharge and stable versus last value on 03/05/2021).  Liver enzymes stable.  No results are available for CBC with differential or CRP.    Urinalysis showed RBCs 1013, WBCs 54, squamous epithelial cells 2, budding yeast 3+ - overall consisted with a contaminated sample.  Urine culture grew ESBL Escherichia coli 10,000 CFU/mL (ciprofloxacin and levofloxacin susceptible using old breakpoints, TMP-SMX susceptible, and nitrofurantoin susceptible).    Talked with the patient on the phone late in the morning and again in the afternoon today.  She denied having any urinary symptoms - no dysuria, frequency, difficulty voiding, flank pain, abdominal pain, nausea, emesis, fevers, or chills.  She stated urinalysis with culture was checked only due to her increased creatinine.  She reported significant bilateral lower extremity swelling below knee level.  She stated she has been having shortness of breath on exertion that has remained unchanged since hospital discharge.  She denied chest pain.  She reported taking and tolerating linezolid and rifampin as prescribed.  She stated she will need to refill her linezolid (prescribed as 14 days = 28 tabs with 1 refill) soon.  She confirmed being aware of her upcoming telephone appointment in [facility] on 03/15/2021.  She mentioned also having an upcoming appointment with her primary care provider ([place]) on 03/15/2021.    Given the patient being enrolled in the OPTIMAL Trial, discussed the patient's case with Dr. Gloriajean Dell and Dr. Renita Papa this afternoon.  Also contacted Nephrology to change the patient's hospital discharge follow-up visit to a telemedicine encounter and move it up if possible.    Will not treat growth of ESBL Escherichia coli on urine  culture at this time due absence of urinary symptoms, specimen being contaminated, and low number of organisms isolated.  Discussed repeating urinalysis with culture on a sample obtained by straight catheterization when the patient sees her primary care provider on 03/15/2021.  Will call her primary care provider's clinic and fax an external order if needed.  Asked the patient to start bumetanide 3 mg PO BID for now (prescription sent to [facility]).  Further management will be per Nephrology (and based on repeat laboratory data if performed prior to her appointment with Nephrology).    Evangeline Dakin, M.D., 03/12/2021  Assistant Professor, Sections of Infectious Diseases and [place] Department of Medicine

## 2021-03-13 ENCOUNTER — Other Ambulatory Visit: Payer: Self-pay

## 2021-03-13 ENCOUNTER — Telehealth (HOSPITAL_BASED_OUTPATIENT_CLINIC_OR_DEPARTMENT_OTHER): Payer: Self-pay | Admitting: Family

## 2021-03-13 NOTE — Telephone Encounter (Signed)
Received message from Dr. Marnette Burgess asking if pt's hospital f/u appt could be changed to a video visit.  Pt does not have an active MyChart.  Dr stated ok to do via phone.  No answer with no availability to leave a voice message on primary number.  Left vm on secondary number instructing pt of appt change from in-person to a telephone visit.  A.Gwynne Kemnitz, RN

## 2021-03-14 ENCOUNTER — Telehealth (INDEPENDENT_AMBULATORY_CARE_PROVIDER_SITE_OTHER): Payer: Self-pay | Admitting: Student in an Organized Health Care Education/Training Program

## 2021-03-14 ENCOUNTER — Other Ambulatory Visit: Payer: Self-pay

## 2021-03-14 DIAGNOSIS — R8271 Bacteriuria: Secondary | ICD-10-CM

## 2021-03-14 NOTE — Progress Notes (Signed)
Brief Infectious Diseases/COpAT Note    [name] is taking linezolid and rifampin for the last 4 weeks of a total 6-week antimicrobial course (end date 03/29/2021) prior to transitioning to oral suppression with doxycycline.    [name] was previously written/prescribed as 28 tabs = 14 day supply with 1 refill.  Since she does not currently have insurance (Her Medicaid previously termed on 02/14/2021.), her medications including both of her antimicrobials were given to her at Case Management cost and covered by Case Management.    Worked with [name], Case Management, and our Discharge Pharmacy today.  [name] current/updated address is as follows: [address] [place], [place] [address].  The remaining supply of her total 28-day course of linezolid will be sent to her by overnight mail from our [facility].    Previously planned for [name] to undergo urinalysis with culture reflex on a specimen obtained by straight catheterization at the time of her appointment with her primary care provider tomorrow.  Neither [name] (primary care provider's clinic) or the outpatient lab at [facility] is able to obtain specimens by straight catheterization.  She may undergo urinalysis with culture reflex on a specimen obtained by straight catheterization by nursing in the emergency department at [facility].  This will be able to be performed as an outpatient lab (not an emergency department visit).  Spoke with the patient and all facilities.  An external order was sent to registration at [facility] as requested.    [name] is aware of her telephone visit in Infectious Diseases Clinic tomorrow morning.    [name] [name], M.D., 03/14/2021  Assistant Professor, Sections of Infectious Diseases and [place] Department of Medicine

## 2021-03-14 NOTE — Care Management Notes (Addendum)
CCC received a call from [place] in the pharmacy. Per Dorian the patient needs another script of Linezolid paid for or auth'd.  [place] made Dorian aware that the patient's meds were covered as below via CM cost on 03-02-2021: She is enrolled in the [place] (03/02/2021-03/29/2021. Linezolid and Rifampin (approved by supervisor K. Z on 02-27-2021. [place] form was completed).   Per Dorian in the discharge pharmacy she will mail the remaining 2 weeks of Linezolid out in the am of 03-15-2021.

## 2021-03-15 ENCOUNTER — Other Ambulatory Visit: Payer: Self-pay

## 2021-03-15 ENCOUNTER — Ambulatory Visit
Payer: MEDICAID | Attending: Student in an Organized Health Care Education/Training Program | Admitting: Student in an Organized Health Care Education/Training Program

## 2021-03-15 DIAGNOSIS — F1120 Opioid dependence, uncomplicated: Secondary | ICD-10-CM | POA: Insufficient documentation

## 2021-03-15 DIAGNOSIS — N179 Acute kidney failure, unspecified: Secondary | ICD-10-CM | POA: Insufficient documentation

## 2021-03-15 DIAGNOSIS — R7982 Elevated C-reactive protein (CRP): Secondary | ICD-10-CM

## 2021-03-15 DIAGNOSIS — R7881 Bacteremia: Secondary | ICD-10-CM

## 2021-03-15 DIAGNOSIS — J942 Hemothorax: Secondary | ICD-10-CM | POA: Insufficient documentation

## 2021-03-15 DIAGNOSIS — I330 Acute and subacute infective endocarditis: Secondary | ICD-10-CM | POA: Insufficient documentation

## 2021-03-15 DIAGNOSIS — B9562 Methicillin resistant Staphylococcus aureus infection as the cause of diseases classified elsewhere: Secondary | ICD-10-CM | POA: Insufficient documentation

## 2021-03-15 DIAGNOSIS — I2690 Septic pulmonary embolism without acute cor pulmonale: Secondary | ICD-10-CM | POA: Insufficient documentation

## 2021-03-15 DIAGNOSIS — Z09 Encounter for follow-up examination after completed treatment for conditions other than malignant neoplasm: Secondary | ICD-10-CM | POA: Insufficient documentation

## 2021-03-16 ENCOUNTER — Ambulatory Visit (INDEPENDENT_AMBULATORY_CARE_PROVIDER_SITE_OTHER): Payer: Self-pay | Admitting: Student in an Organized Health Care Education/Training Program

## 2021-03-16 LAB — ENTER/EDIT OPAT LABS
ALKALINE PHOSPHATASE: 117
ALT (SGPT): 24
AST (SGOT): 28
BASOPHILS: 1
BILIRUBIN, TOTAL: 0.6
BUN: 40
C-REACTIVE PROTEIN HIGH SENSITIVITY (INFLAMMATION): 20 mg/l — AB (ref 0–8)
CREATININE: 2.9
EOSINOPHIL: 2.4
HCT: 22.8
HGB: 7.5
INR: 1.4
LYMPHOCYTES: 32.8
MONOCYTES: 7.9
PLATELET COUNT: 150
PMN ABS: 3
PMN'S: 55.9
PROTHROMBIN TIME: 15.3
WBC: 5.3

## 2021-03-16 NOTE — Telephone Encounter (Signed)
Returned call, Cassandra unavailable, left message with staff to inform her that ID is not monitoring patient's anticoagulation but per a recent note from [facility] PharmD "Why is [facility] no longer following patient? Pt established with PCP 03/08/21. If remaining on warfarin: Warfarin now managed by: [facility] - Dr. Levada Dy" Staff verbalized understanding and stated that they would send the message forward to [place]. Thornell Sartorius, RN  03/16/2021, 10:43

## 2021-03-16 NOTE — Telephone Encounter (Signed)
Regarding: call back about last labs and coumadin prescriber  ----- Message from Juanetta Snow sent at 03/15/2021  1:25 PM EDT -----  Evangeline Dakin, MD  Provider wants to know who has been adjusting pt Coumdin and if we are going to be doing it or if he should be doing Please call pt to advise.

## 2021-03-16 NOTE — Progress Notes (Signed)
Infectious Diseases Clinic  Operated by [facility].  Telephone Return Patient Note     Date:   03/15/2021  Name: [name] [name]  Age: 35 y.o.    TELEMEDICINE DOCUMENTATION:    Patient Location: Telephone visit from home - current address in [address], [place], [place], [address] (not [place] address)  Patient aware of provider location: Yes  Patient consent for telemedicine service: Yes  Provider performing visit: Dr. 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 She initially presented to the emergency department at [facility] on 01/31/2021 with drug overdose, and she was admitted there 02/01/2021-02/04/2021 prior to being transferred to our facility. She was intubated for airway protection. She had progression of acute kidney injury to anuric acute renal failure requiring dialysis through a temporary dialysis catheter. She developed anemia requiring blood transfusion and thrombocytopenia. Blood cultures 02/01/2021-02/04/2021 grew MRSA (vancomycin MIC 2, rifampin MIC 1) with clearance of bacteremia not achieved at that time. Chest radiograph on 01/31/2021 showed right middle lobe patchy opacity concerning for aspiration, pulmonary vascular congestion, and cardiomegaly. CT brain without contrast on 01/31/2021 showed no acute intracranial abnormality.  Renal ultrasound on 02/02/2021 was unremarkable. Transthoracic echocardiogram on 02/03/2021 showed left ventricular ejection fraction 55-60%, large tricuspid vegetation 1.91 cm X 1.67 cm with moderate tricuspid regurgitation, and trivial mitral regurgitation. Repeat CT brain without contrast on 02/04/2021 showed no acute intracranial abnormality. CT chest without contrast on 02/04/2021 showed multiple bilateral pulmonary nodules (solid, groundglass, and cavitary) predominantly located peripherally consistent with septic pulmonary emboli, small bilateral pleural effusion, and marked splenomegaly. The patient received ceftriaxone on 01/31/2021, vancomycin 02/02/2021-02/04/2021, piperacillin-tazobactam 02/01/2021-02/02/2021, and cefepime and metronidazole 02/02/2021-02/04/2021. Following arrival at our facility, blood cultures 02/05/2021-02/15/2021 grew MRSA (vancomycin MIC 1, daptomycin MIC 0.5, ceftaroline susceptible, rifampin MIC <=0.5) with clearance of bacteremia ultimately achieved postoperatively on 02/16/2021. BAL cultures on 02/05/2021 and 02/13/2021 also grew MRSA. Transthoracic echocardiogram on 02/05/2021 showed left ventricular ejection fraction 62.6%, tricuspid vegetation 1.4 cm X 0.8 cm with mild tricuspid regurgitation, and normal appearing mitral valve. MRI brain without contrast on 02/06/2021 showed no acute intracranial abnormality. MRIs cervical, thoracic, and lumbosacral spine without contrast on 02/10/2021 showed no evidence of infection. Repeat transthoracic echocardiogram on 02/11/2021 showed tricuspid vegetation with severe tricuspid regurgitation. The patient underwent extraction of remaining teeth on 02/09/2021. She underwent bioprosthetic tricuspid valve replacement with primary closure of patient foramen ovale on 02/13/2021 when operative culture grew MRSA. Surgical pathology showed valve tissue with fibrosis and adherent vegetations containing numerous colonies of bacterial organisms (predominantly  gram positive cocci with nonviable forms) consistent with endocarditis. She had her last dialysis session on 02/14/2021 with removal of her most recent left femoral temporary dialysis catheter at that time. She developed a rash around 02/15/2021 that raised concern for vancomycin-induced rash. 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 Chest radiographs 02/17/2021-02/23/2021 showed bilateral pleural effusions with partially loculated right pleural effusion reported as of 02/22/2021. The patient underwent right chest tube insertion on 02/23/2021 with immediate  drainage of approximately 1 liter of bloody fluid consistent with hemothorax and subsequent removal on 02/25/2021. Right pleural fluid culture on 02/23/2021 had no growth with no organisms on Gram stain. The patient received vancomycin 02/06/2021-02/14/2021 prior to being transitioned to daptomycin (02/17/2021-03/01/2021) and rifampin (02/16/2021-present). Plan was made for atotal 6-week antimicrobial course dated from negative blood cultures (end date 03/29/2021).  As clearance of bacteremia was not achieved preoperatively and operative cultures were positive, decision was made to following the initial 6-weeks of treatment with doxycycline oral suppression for 6 months.  Pre-enrollment testing for the OPTIMAL Trial performed on 02/26/2021 included blood cultures X 2 sets that were negative; CT chest, abdomen, and pelvis without contrast that showed postoperative changes from tricuspid valve repair with loculated fluid along right heart border (similar appearance to right hemothorax prior to chest tube drainage; felt to be postoperative fluid/small amount of bloody fluid), redemonstration of numerous septic emboli in bilateral lungs with improving groundglass and intralobular septal thickening, mild interval worsening of small right pleural effusion and right basilar consolidation with trace hydropneumothorax  on right, and marked hepatosplenomegaly; and transthoracic echocardiogram that showed left ventricular ejection fraction 61.2% and tricuspid bioprosthesis with normal leaflet motion and no paravalvular tricuspid regurgitation.  Our Psychiatry/Substance Use Treatment evaluated the patient and assisted with transition to Suboxone on 02/27/2021.  The patient consented for enrollment in the [place] and was randomized to the oral antimicrobial arm beginning 03/02/2021.  Her antimicrobial regimen changed to linezolid 600 mg PO BID and rifampin 300 mg PO BID that she will continue for the last 4 weeks (03/02/2021-03/29/2021) of a total 6-week antimicrobial treatment course prior to transitioning to doxycycline oral suppression (as of 03/30/2021) for 6 months.  Following hospital discharge, the patient was found to have acute kidney injury/worsening creatinine again with reported bilateral lower extremity swelling below knee level.  Following discussion with Dr. Renita Papa, she was prescribed bumetanide (Bumex) 3 mg PO BID on 03/12/2021.  Nephrology was also contacted at that time.    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  She reports doing very well since hospital discharge with exception of having significant bilateral lower extremity swelling below knee level.  She denies fevers and chills.  She states her sternal incision looks great - closed and flat without any erythema or drainage.  She relates her prior chest tube sites are completely healed.  She denies chest pain.  She attributes mild shortness of breath on exertion to difficulty walking due to lower extremity edema.  She reports her right leg is more swollen than the left, noting her legs look like she is a 500 and 300 pound person, respectively.  She denies having any urinary symptoms.  She indicates she has not yet been  able to pick up Bumex and a few other medications that are due to be refilled soon due to her Medicaid being inactive.  She reports being notified her Medicaid is active again as of this morning, and she plans to pick up her medications today.  She is aware the remainder of her linezolid supply is being sent by overnight mail from our [facility] and will be delivered tomorrow.  She states she is taking both linezolid and rifampin without noticing any side effects - no nausea, emesis, diarrhea, pruritus, or rash.  She denies missing any doses  She states she called [facility] for her intake visit  but was not able to get through to the facility.  She reports she has since established at [facility], which also has a Suboxone probram.  She states she has been able to continue Suboxone.  She expresses interest in potentially receiving Sublocade in the future if able to be arranged.  She relates she is attending group therapy weekly on Mondays.  She confirms she is doing well with sobriety without any lapses/relapses since hospital discharge.  She is aware of her upcoming telephone visit with Nephrology and in-person visits in Cardiac Surgery and Infectious Diseases Clinic.    Medications:  Current Outpatient Medications   Medication Sig   . aspirin 81 mg Oral Tablet, Chewable Chew 1 Tablet (81 mg total) Once a day for 30 days   . B complex-vitamin C-folic acid (NEPHROCAPS) 1 mg Oral Capsule Take 1 Capsule by mouth Once a day for 30 days   . bumetanide (BUMEX) 2 mg Oral Tablet Take 1.5 Tablets (3 mg total) by mouth Twice daily for 14 days   . buprenorphine-naloxone (SUBOXONE) 8-2 mg Sublingual Film Place 1 Film under the tongue Once a day   . famotidine (PEPCID) 20 mg Oral Tablet Take 1 Tablet (20 mg total) by mouth Once a day for 30 days   . hydrOXYzine pamoate (VISTARIL) 50 mg Oral Capsule Take 1 Capsule (50 mg total) by mouth Four times a day as needed for Anxiety   . linezolid (ZYVOX)  600 mg Oral Tablet Take 1 Tablet (600 mg total) by mouth Twice daily for 28 days   . melatonin 3 mg Oral Tablet Take 2 Tablets (6 mg total) by mouth Every night for 30 days   . metoprolol tartrate (LOPRESSOR) 25 mg Oral Tablet Take 1 Tablet (25 mg total) by mouth Twice daily for 30 days   . QUEtiapine (SEROQUEL) 50 mg Oral Tablet Take 1 Tablet (50 mg total) by mouth Every night for 30 days   . rifAMPin (RIFADIN) 300 mg Oral Capsule Take 1 Capsule (300 mg total) by mouth Once a day for 28 days   . sevelamer Carbonate (RENVELA) 0.8 gram Oral Powder in Packet Take 3 Packets (2,400 mg total) by mouth Three times daily with meals   . sodium bicarbonate 650 mg Oral Tablet Take 2 Tablets (1,300 mg total) by mouth Three times a day for 30 days   . warfarin (COUMADIN) 2 mg Oral Tablet Take 1 Tablet (2 mg total) by mouth Every night for 14 days     Objective:  Data Reviewed:  Laboratory Data:   03/01/2021 03/02/2021 03/05/2021 03/09/2021   WBC 7.0  6.2    HGB 7.3 (L)  8    HCT 22.6 (L)  23.5    PLATELETS 237  222    RBC 2.67 (L)      MCV 84.6      MCHC 32.3      MCH 27.3      RDW-CV 17.7 (H)      MPV 8.2 (L)      PMNS 67  60.1    LYMPHOCYTES 23  29.6    EOSINOPHIL 0  0.8    MONOCYTES 8  8    BASOPHILS 1  1.5    PMN ABS 4.75  3.7    LYMPHS ABS 1.59      EOS ABS <0.10      MONOS ABS 0.54      BASOS ABS <0.10      PT 16.5 (H)  17.0 (H) 15    INR 1.42 (H) 1.47 (H) 1.4    SODIUM 135 (L)      POTASSIUM 3.8   4   CHLORIDE 106      CO2 18 (L)      BUN 60 (H)  59 51   CREATININE 1.74 (H)  3.3 3.3   GLUCOSE 118      ANION GAP 11      BUN/CREAT 34 (H)      EGFR 39 (L)      CALCIUM 7.8 (L)      MAGNESIUM 1.9      PHOSPHORUS 6.9 (H)      TOTAL BILIRUBIN   1.1 1.1   AST   30 38   ALT   17 30   ALP    147         Microbiology:  [facility]:  02/01/2021-02/04/2021 Blood cultures: MRSA (vancomycin MIC 2, rifampin MIC <1, tetracycline MIC <4)  02/02/2021 Sputum culture: MRSA, yeast, and usual flora  [facility]:  02/05/2021-02/15/2021 Blood cultures: MRSA (vancomycin MIC 1, rifampin MIC <=0.5, daptomycin MIC 0.5, ceftaroline susceptible, tetracycline MIC <=1, doxycycline MIC <=0.5)  02/05/2021 BAL culture: 15,000 MRSA  02/13/2021 Operative culture from tricuspid valve tissue: MRSA  02/13/2021 BAL culture: MRSA  02/16/2021 and 02/25/2021 Blood cultures: No growth  02/23/2021 Right pleural fluid culture: No growth  03/09/2021 Urine culture: ESBL Escherichia coli 10,000 CFU/mL (ciprofloxacin and levofloxacin susceptible using old breakpoints, TMP-SMX susceptible, and nitrofurantoin susceptible); note: urinalysis at this time showed RBCs 1013, WBCs 54, squamous epithelial cells 2, budding yeast 3+ - overall consisted with a contaminated sample      Imaging:  02/26/2021 CT chest, abdomen, and pelvis without contrast: Ppstoperative changes from tricuspid valve repair with loculated fluid along right heart border (similar appearance to right hemothorax prior to chest tube drainage; felt to be postoperative fluid/small amount of bloody fluid), redemonstration of numerous septic emboli in bilateral lungs with improving groundglass and intralobular septal thickening, mild interval worsening of small right pleural effusion and right basilar consolidation with trace hydropneumothorax on right, and marked hepatosplenomegaly    Cardiovascular Studies:  02/26/2021 Transthoracic echocardiogram: Left ventricular ejection fraction 61.2% and tricuspid bioprosthesis with normal leaflet motion and no paravalvular tricuspid regurgitation    Assessment:  1. Infective endocarditis of tricuspid valve    2. Septic pulmonary emboli (CMS HCC)    3. MRSA bacteremia    4. Hemothorax on right    5. Acute kidney injury (CMS HCC)    6. CRP elevated    7. 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 She initially presented to the emergency department at [facility] on 01/31/2021 and was admitted there 02/01/2021-02/04/2021 prior to being transferred to our facility. She developed acute renal failure requiring dialysis, and blood cultures 02/01/2021-02/04/2021 grew MRSA (vancomycin MIC 2, rifampin MIC 1) for which she was primarily treated with an antimicrobial regimen including vancomycin prior to transfer.  Following arrival at our facility, blood cultures 02/05/2021-02/15/2021 grew MRSA with clearance of  bacteremia ultimately achieved postoperatively on 02/16/2021. BAL cultures on 02/05/2021 and 02/13/2021 also grew MRSA. The patient underwent bioprosthetic tricuspid valve replacement with primary closure of patient foramen ovale on 02/13/2021 when operative culture grew MRSA. Surgical pathology showed valve tissue with fibrosis and adherent vegetations containing numerous colonies of bacterial organisms (predominantly gram positive cocci with nonviable forms) consistent with endocarditis. She had her last dialysis session on 02/14/2021 with removal of her most recent left femoral temporary dialysis catheter at that time. 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  Plan was made for atotal 6-week antimicrobial course dated from negative blood cultures (end date 03/29/2021).  As clearance of bacteremia was not achieved preoperatively and operative cultures were positive, decision was made to  following the initial 6-weeks of treatment with doxycycline oral suppression for 6 months.  Our Psychiatry/Substance Use Treatment evaluated the patient and assisted with transition to Suboxone.  The patient consented for enrollment in the [place] and was randomized to the oral antimicrobial arm beginning 03/02/2021 after completing an initial 2-week postoperative course of intravenous antimicrobial therapy.  She is currently receiving linezolid 600 mg PO BID and rifampin 300 mg PO BID that she will continue to complete last 4 weeks (03/02/2021-03/29/2021) of a total 6-week antimicrobial treatment course prior to transitioning to doxycycline oral suppression (as of 03/30/2021) for 6 months.  She is going well clinically from an Infectious Diseases Standpoint.  CRP has not yet normalized (43 on 03/05/2021) - wonder if this is related to her acute kidney injury.  Of note, CRP was not obtained with most recent labs.  She reportedly does have profound bilateral lower extremity edema below knee level with some mild dyspnea on exertion and acute kidney injury/worsening creatinine (3.3 on 03/05/2021 and 03/12/2021).  Expect she will benefit from diuretic therapy prescribed on 03/12/2021 that has not been picked up yet due her Medicaid being inactive until this morning.  Specimen obtained for prior urinalysis with culture that grew ESBL Escherichia coli 10,000 colonies/mL was contaminated, and she remains without any urinary symptoms.  She is tolerating linezolid and rifampin without missing any doses or having any side effects or adverse events.    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 Continue linezolid 600 mg PO BID and rifampin 300 mg BID through 03/29/2021 to complete the last 4 weeks of a total 6-week postoperative antimicrobial course.  A refill for the remainder of her supply of linezolid is being sent by overnight mail from our [facility] (will arrive 03/16/2021).  2. Following completion of the above regimen, transition to oral suppression with doxycycline 100 mg PO BID (as of 03/30/2021) for 6 months.  3. Results of recent laboratory testing for the OPTIMAL Trial and per [facility] standard of care were reviewed/discussed with the patient and are as previously noted.  4. Continue weekly laboratory testing including CBC with differential, BUN, creatinine, AST, ALT, alkaline phosphatase, total bilirubin, and CRP while completing the treatment phase of oral antimicrobial therapy.  External orders were previously placed/faxed to [facility].  Laboratory testing will be less frequent following transition to oral suppression.  5. External order for urinalysis with culture reflex to be performed on a specimen obtained by straight catheterization in the emergency department at [facility] was faxed/printed yesterday morning.  6. The patient is to remain  on warfarin for 6-12 months postoperatively targeting INR of 2.  INR monitoring/warfarin dosing is currently being performed by [facility] with plan for it to transition to her local PCP.  7. Encouraged the patient to pick up all medications filled by her local pharmacy including bumetanide 3 mg PO BID.  She is planning to pick them up today now that her Medicaid is active again.  8. Encouraged continued sobriety.  The patient expressed motivation.  She will continue MAT with Suboxone (possibly eventually Sublocade) obtained through [facility] and associated weekly group therapy.  9. The patient is scheduled for a telephone visit in Nephrology Clinic with Janann Colonel, APRN, NP-C, on 03/20/2021 at 9:30 AM.  She is aware of and plans to attend this appointment.  10. The patient is scheduled for an in-person return patient visit in Cardiac Surgery Clinic/[facility] with Dr. Renita Papa on 03/27/2021 at 1:15 PM.  She is aware of and plans to attend this  appointment.  11. The patient is scheduled for an in-person return patient visit in [facility] with me on 03/27/2021 at 2:00 PM.  She is aware of and plans to attend this appointment.  She was asked to please call Infectious Diseases Clinic with any questions or concerns prior to this time.    Total Time on Phone with Patient: 14 minutes with an additional 60 minutes spent reviewing the patient's chart, coordinating care, and documenting.     Evangeline Dakin, M.D., 03/15/2021  Assistant Professor, Sections of Infectious Diseases and [place] Department of Medicine

## 2021-03-19 ENCOUNTER — Encounter (INDEPENDENT_AMBULATORY_CARE_PROVIDER_SITE_OTHER): Payer: Self-pay | Admitting: Student in an Organized Health Care Education/Training Program

## 2021-03-20 ENCOUNTER — Ambulatory Visit: Payer: MEDICAID | Admitting: Family

## 2021-03-20 ENCOUNTER — Encounter (HOSPITAL_BASED_OUTPATIENT_CLINIC_OR_DEPARTMENT_OTHER): Payer: Self-pay | Admitting: Family

## 2021-03-20 ENCOUNTER — Telehealth (INDEPENDENT_AMBULATORY_CARE_PROVIDER_SITE_OTHER): Payer: Self-pay | Admitting: Family

## 2021-03-20 DIAGNOSIS — Z029 Encounter for administrative examinations, unspecified: Secondary | ICD-10-CM

## 2021-03-20 NOTE — Telephone Encounter (Signed)
Called patient to complete telephone visit. Patient did not answer home phone, no opportunity to leave voicemail. Called mobile phone. Left voicemail asking for return call to reschedule appointment.     Janann Colonel, PhD, NP-C  Section of Nephrology  [facility] Department of Medicine

## 2021-03-20 NOTE — Progress Notes (Signed)
The patient did not appear for their appointment/or scheduled appointment was cancelled.  This office visit opened in error.

## 2021-03-22 ENCOUNTER — Encounter (INDEPENDENT_AMBULATORY_CARE_PROVIDER_SITE_OTHER): Payer: Self-pay | Admitting: Student in an Organized Health Care Education/Training Program

## 2021-03-22 NOTE — Progress Notes (Signed)
Complex Oral Outpatient Antimicrobial Therapy Note ([facility]):    Patient was discharged from [facility] to home on 03/02/2021 to complete the following oral antimicrobial treatment course.     Indication: MRSA native tricuspid valve endocarditis  Antimicrobial(s): Linezolid 600 mg PO twice daily and rifampin 300 mg PO twice daily (as part of OPTIMAL trial); following completion, will transition to oral suppression with doxycycline 100 mg PO twice daily for 6 months  End date: 03/29/2021  ID Clinic follow up date: 03/27/2021    Attempted to call patient, no answer, left voicemail.    Linden Dolin, PharmD  Infectious Diseases Clinical Pharmacist  [facility]/Antimicrobial Stewardship  [phone] Ext. [phone]    I reviewed the [facility] note and agree with the findings and plan of care as documented in the note.  Most recent [facility] labs from 03/16/2021 reviewed.    Evangeline Dakin, M.D., 03/23/2021  Assistant Professor, Sections of Infectious Diseases and [place] Department of Medicine

## 2021-03-26 ENCOUNTER — Encounter (INDEPENDENT_AMBULATORY_CARE_PROVIDER_SITE_OTHER): Payer: Self-pay | Admitting: Student in an Organized Health Care Education/Training Program

## 2021-03-26 NOTE — Progress Notes (Signed)
Complex Oral Outpatient Antimicrobial Therapy Note ([facility]):     Patient was discharged from [facility] to home on 03/02/2021 to complete the following oral antimicrobial treatment course.      Indication: MRSA native tricuspid valve endocarditis  Antimicrobial(s): Linezolid 600 mg PO twice daily and rifampin 300 mg PO twice daily (as part of OPTIMAL trial); following completion, will transition to oral suppression with doxycycline 100 mg PO twice daily for 6 months  End date: 03/29/2021  ID Clinic follow up date: 03/27/2021    Attempted to call patient 03/26/2021, no answer at either contact in EMR, left voicemails at both, has ID follow up scheduled for 10/11.    Linden Dolin, PharmD  Infectious Diseases Clinical Pharmacist  [facility]/Antimicrobial Stewardship  [phone] Ext. [phone]    I reviewed the [facility] note and agree with the findings and plan of care as documented in the note.    Evangeline Dakin, M.D., 03/26/2021  Assistant Professor, Sections of Infectious Diseases and [place] Department of Medicine

## 2021-03-27 ENCOUNTER — Other Ambulatory Visit (INDEPENDENT_AMBULATORY_CARE_PROVIDER_SITE_OTHER): Payer: Self-pay | Admitting: Student in an Organized Health Care Education/Training Program

## 2021-03-27 ENCOUNTER — Encounter (INDEPENDENT_AMBULATORY_CARE_PROVIDER_SITE_OTHER): Payer: Self-pay

## 2021-03-27 ENCOUNTER — Ambulatory Visit (INDEPENDENT_AMBULATORY_CARE_PROVIDER_SITE_OTHER): Payer: MEDICAID | Admitting: Student in an Organized Health Care Education/Training Program

## 2021-03-27 ENCOUNTER — Encounter (HOSPITAL_COMMUNITY): Payer: Self-pay | Admitting: Student in an Organized Health Care Education/Training Program

## 2021-03-27 MED ORDER — DOXYCYCLINE HYCLATE 100 MG TABLET
100.0000 mg | ORAL_TABLET | Freq: Two times a day (BID) | ORAL | 5 refills | Status: DC
Start: 2021-03-30 — End: 2023-02-17

## 2021-03-27 NOTE — Progress Notes (Signed)
Brief Infectious Diseases Note    [name] [name] was scheduled for return patient visits in Cardiac Surgery Clinic/[facility] with Dr. Renita Papa on 03/27/2021 at 1:15 PM and Infectious Diseases Clinic with me on 03/27/2021 at 2:00 PM.  She did not show for either of her appointments.  Tried calling her at the time of her appointment in [facility] in attempt to convert to a telephone visit and/or reschedule.  Left a voicemail for the patient and provided contact information for her to call back.  Spoke with the patient's mother (patient contact in Epic), who resides in [place].  She said she would call [name] [name] and ask her to return my call to regarding follow-up.  The patient did not call back on 03/27/2021.    [facility] to check for any recent lab data and results of urinalysis with culture reflex obtained by straight catheterization (that was to be performed on 03/16/2021 in the emergency department at [facility] when she was otherwise going to be at the facility for labs).  Her last labs were those obtained on 03/16/2021.  Her only urinalysis with urine culture reflex was reportedlay that on 9/23/202022 when urinalysis results were consistent with a contaminated sample and urine culture grew ESBL Escherichia coli 10,000 CFU/mL.    Went ahead and sent an electronic prescription for doxycycline 100 mg PO BID X 30 days with 5 refills (33-monthsupply) that [facility]  Need for oral suppression was previously discussed with the patient, and she expressed understanding.  Will continue trying to contact the patient to ask her to please pick up the medication and begin taking it (as well as to attempt to reschedule her follow-up visits).    JEvangeline Dakin M.D., 03/27/2021  Assistant Professor, Sections of Infectious Diseases and [facility] [place]

## 2021-03-29 ENCOUNTER — Encounter (INDEPENDENT_AMBULATORY_CARE_PROVIDER_SITE_OTHER): Payer: Self-pay | Admitting: Student in an Organized Health Care Education/Training Program

## 2021-03-29 NOTE — Progress Notes (Signed)
Complex Oral Outpatient Antimicrobial Therapy Note (COpAT):     Patient was discharged from [facility] to home on 03/02/2021 to complete the following oral antimicrobial treatment course.      Indication: MRSA native tricuspid valve endocarditis  Antimicrobial(s): Linezolid 600 mg PO twice daily and rifampin 300 mg PO twice daily (as part of OPTIMAL trial); following completion, will transition to oral suppression with doxycycline 100 mg PO twice daily for 6 months  End date: 03/29/2021  [facility] Clinic follow up date: 03/27/2021    Attempted to reach patient, no answer, left voicemail. Today was planned end date of therapy as above, see Dr. Cloria Spring note regarding plan for suppression which was to start 03/30/2021.    [name] [name], PharmD  Infectious Diseases Clinical Pharmacist  OPAT/Antimicrobial Stewardship  [phone] Ext. [phone]    I reviewed the COpAT note and agree with the findings and plan of care as documented in the note.  Called [name] [name] again yesterday, but she did not answer.  Left a voicemail letting her know that I had prescribed doxycycline and asked her to please pick it up from [facility] pharmacy to begin taking 03/30/2021.  Left callback information for her.    Evangeline Dakin, M.D., 03/30/2021  Assistant Professor, Sections of Infectious Diseases and [place] Department of Medicine

## 2021-04-03 ENCOUNTER — Other Ambulatory Visit: Payer: Self-pay

## 2021-04-11 ENCOUNTER — Other Ambulatory Visit: Payer: Self-pay

## 2021-04-15 ENCOUNTER — Inpatient Hospital Stay (HOSPITAL_COMMUNITY): Payer: Medicaid - Out of State

## 2021-04-15 ENCOUNTER — Emergency Department (HOSPITAL_COMMUNITY): Payer: Medicaid - Out of State

## 2021-04-15 ENCOUNTER — Inpatient Hospital Stay (HOSPITAL_COMMUNITY)
Admission: EM | Admit: 2021-04-15 | Discharge: 2021-04-27 | DRG: 291 | Disposition: A | Discharge status: Home / Self Care | Payer: Medicaid - Out of State | Attending: Family Medicine | Admitting: Family Medicine

## 2021-04-15 ENCOUNTER — Encounter (HOSPITAL_COMMUNITY): Payer: Self-pay | Admitting: Student

## 2021-04-15 ENCOUNTER — Other Ambulatory Visit: Payer: Self-pay

## 2021-04-15 DIAGNOSIS — B372 Candidiasis of skin and nail: Secondary | ICD-10-CM | POA: Diagnosis not present

## 2021-04-15 DIAGNOSIS — I3139 Other pericardial effusion (noninflammatory): Secondary | ICD-10-CM | POA: Diagnosis present

## 2021-04-15 DIAGNOSIS — I130 Hypertensive heart and chronic kidney disease with heart failure and stage 1 through stage 4 chronic kidney disease, or unspecified chronic kidney disease: Secondary | ICD-10-CM | POA: Diagnosis present

## 2021-04-15 DIAGNOSIS — Z23 Encounter for immunization: Secondary | ICD-10-CM

## 2021-04-15 DIAGNOSIS — R609 Edema, unspecified: Secondary | ICD-10-CM | POA: Diagnosis not present

## 2021-04-15 DIAGNOSIS — S37012A Minor contusion of left kidney, initial encounter: Secondary | ICD-10-CM | POA: Diagnosis not present

## 2021-04-15 DIAGNOSIS — Y848 Other medical procedures as the cause of abnormal reaction of the patient, or of later complication, without mention of misadventure at the time of the procedure: Secondary | ICD-10-CM | POA: Diagnosis not present

## 2021-04-15 DIAGNOSIS — N99840 Postprocedural hematoma of a genitourinary system organ or structure following a genitourinary system procedure: Secondary | ICD-10-CM | POA: Diagnosis not present

## 2021-04-15 DIAGNOSIS — R7881 Bacteremia: Secondary | ICD-10-CM | POA: Diagnosis present

## 2021-04-15 DIAGNOSIS — F419 Anxiety disorder, unspecified: Secondary | ICD-10-CM | POA: Diagnosis present

## 2021-04-15 DIAGNOSIS — F17210 Nicotine dependence, cigarettes, uncomplicated: Secondary | ICD-10-CM | POA: Diagnosis present

## 2021-04-15 DIAGNOSIS — B182 Chronic viral hepatitis C: Secondary | ICD-10-CM | POA: Diagnosis present

## 2021-04-15 DIAGNOSIS — Z87441 Personal history of nephrotic syndrome: Secondary | ICD-10-CM

## 2021-04-15 DIAGNOSIS — Z952 Presence of prosthetic heart valve: Secondary | ICD-10-CM

## 2021-04-15 DIAGNOSIS — D631 Anemia in chronic kidney disease: Secondary | ICD-10-CM | POA: Diagnosis present

## 2021-04-15 DIAGNOSIS — F1910 Other psychoactive substance abuse, uncomplicated: Secondary | ICD-10-CM | POA: Diagnosis not present

## 2021-04-15 DIAGNOSIS — R0600 Dyspnea, unspecified: Secondary | ICD-10-CM

## 2021-04-15 DIAGNOSIS — Z8679 Personal history of other diseases of the circulatory system: Secondary | ICD-10-CM

## 2021-04-15 DIAGNOSIS — Z8774 Personal history of (corrected) congenital malformations of heart and circulatory system: Secondary | ICD-10-CM

## 2021-04-15 DIAGNOSIS — I5021 Acute systolic (congestive) heart failure: Secondary | ICD-10-CM | POA: Diagnosis present

## 2021-04-15 DIAGNOSIS — Z20822 Contact with and (suspected) exposure to covid-19: Secondary | ICD-10-CM | POA: Diagnosis present

## 2021-04-15 DIAGNOSIS — R058 Other specified cough: Secondary | ICD-10-CM

## 2021-04-15 DIAGNOSIS — Z9889 Other specified postprocedural states: Secondary | ICD-10-CM | POA: Diagnosis not present

## 2021-04-15 DIAGNOSIS — Z91119 Patient's noncompliance with dietary regimen due to unspecified reason: Secondary | ICD-10-CM

## 2021-04-15 DIAGNOSIS — N289 Disorder of kidney and ureter, unspecified: Secondary | ICD-10-CM

## 2021-04-15 DIAGNOSIS — Z79899 Other long term (current) drug therapy: Secondary | ICD-10-CM

## 2021-04-15 DIAGNOSIS — L97919 Non-pressure chronic ulcer of unspecified part of right lower leg with unspecified severity: Secondary | ICD-10-CM | POA: Diagnosis present

## 2021-04-15 DIAGNOSIS — D62 Acute posthemorrhagic anemia: Secondary | ICD-10-CM | POA: Diagnosis present

## 2021-04-15 DIAGNOSIS — Z7901 Long term (current) use of anticoagulants: Secondary | ICD-10-CM | POA: Diagnosis not present

## 2021-04-15 DIAGNOSIS — I330 Acute and subacute infective endocarditis: Secondary | ICD-10-CM | POA: Diagnosis not present

## 2021-04-15 DIAGNOSIS — J9601 Acute respiratory failure with hypoxia: Secondary | ICD-10-CM | POA: Diagnosis present

## 2021-04-15 DIAGNOSIS — I509 Heart failure, unspecified: Secondary | ICD-10-CM | POA: Diagnosis not present

## 2021-04-15 DIAGNOSIS — R310 Gross hematuria: Secondary | ICD-10-CM | POA: Diagnosis not present

## 2021-04-15 DIAGNOSIS — Z881 Allergy status to other antibiotic agents status: Secondary | ICD-10-CM

## 2021-04-15 DIAGNOSIS — Z87442 Personal history of urinary calculi: Secondary | ICD-10-CM

## 2021-04-15 DIAGNOSIS — J810 Acute pulmonary edema: Secondary | ICD-10-CM | POA: Diagnosis not present

## 2021-04-15 DIAGNOSIS — N1832 Chronic kidney disease, stage 3b: Secondary | ICD-10-CM | POA: Diagnosis present

## 2021-04-15 DIAGNOSIS — R778 Other specified abnormalities of plasma proteins: Secondary | ICD-10-CM | POA: Diagnosis present

## 2021-04-15 DIAGNOSIS — B9562 Methicillin resistant Staphylococcus aureus infection as the cause of diseases classified elsewhere: Secondary | ICD-10-CM | POA: Diagnosis present

## 2021-04-15 DIAGNOSIS — Z8249 Family history of ischemic heart disease and other diseases of the circulatory system: Secondary | ICD-10-CM

## 2021-04-15 DIAGNOSIS — R0609 Other forms of dyspnea: Secondary | ICD-10-CM | POA: Diagnosis not present

## 2021-04-15 DIAGNOSIS — N179 Acute kidney failure, unspecified: Secondary | ICD-10-CM | POA: Diagnosis present

## 2021-04-15 DIAGNOSIS — R0603 Acute respiratory distress: Secondary | ICD-10-CM

## 2021-04-15 DIAGNOSIS — D649 Anemia, unspecified: Secondary | ICD-10-CM

## 2021-04-15 DIAGNOSIS — F1911 Other psychoactive substance abuse, in remission: Secondary | ICD-10-CM | POA: Diagnosis not present

## 2021-04-15 DIAGNOSIS — R319 Hematuria, unspecified: Secondary | ICD-10-CM | POA: Diagnosis present

## 2021-04-15 DIAGNOSIS — Z8614 Personal history of Methicillin resistant Staphylococcus aureus infection: Secondary | ICD-10-CM | POA: Diagnosis not present

## 2021-04-15 DIAGNOSIS — Z7982 Long term (current) use of aspirin: Secondary | ICD-10-CM

## 2021-04-15 DIAGNOSIS — D6489 Other specified anemias: Secondary | ICD-10-CM | POA: Diagnosis not present

## 2021-04-15 DIAGNOSIS — Z87440 Personal history of urinary (tract) infections: Secondary | ICD-10-CM

## 2021-04-15 DIAGNOSIS — Z86711 Personal history of pulmonary embolism: Secondary | ICD-10-CM

## 2021-04-15 DIAGNOSIS — Z90710 Acquired absence of both cervix and uterus: Secondary | ICD-10-CM

## 2021-04-15 DIAGNOSIS — N184 Chronic kidney disease, stage 4 (severe): Secondary | ICD-10-CM | POA: Diagnosis not present

## 2021-04-15 DIAGNOSIS — Z8541 Personal history of malignant neoplasm of cervix uteri: Secondary | ICD-10-CM

## 2021-04-15 HISTORY — DX: Endocarditis, valve unspecified: I38

## 2021-04-15 HISTORY — DX: Other psychoactive substance abuse, uncomplicated: F19.10

## 2021-04-15 LAB — CBC
HCT: 25.4 % — ABNORMAL LOW (ref 36.0–46.0)
HCT: 27.1 % — ABNORMAL LOW (ref 36.0–46.0)
Hemoglobin: 8.2 g/dL — ABNORMAL LOW (ref 12.0–15.0)
Hemoglobin: 8.4 g/dL — ABNORMAL LOW (ref 12.0–15.0)
MCH: 30.3 pg (ref 26.0–34.0)
MCH: 29.5 pg (ref 26.0–34.0)
MCHC: 32.3 g/dL (ref 30.0–36.0)
MCHC: 31 g/dL (ref 30.0–36.0)
MCV: 93.7 fL (ref 80.0–100.0)
MCV: 95.1 fL (ref 80.0–100.0)
Platelets: 167 10*3/uL (ref 150–400)
Platelets: 167 10*3/uL (ref 150–400)
RBC: 2.71 MIL/uL — ABNORMAL LOW (ref 3.87–5.11)
RBC: 2.85 MIL/uL — ABNORMAL LOW (ref 3.87–5.11)
RDW: 19 % — ABNORMAL HIGH (ref 11.5–15.5)
RDW: 19.4 % — ABNORMAL HIGH (ref 11.5–15.5)
WBC: 7.3 10*3/uL (ref 4.0–10.5)
WBC: 6.7 10*3/uL (ref 4.0–10.5)
nRBC: 0 % (ref 0.0–0.2)
nRBC: 0 % (ref 0.0–0.2)

## 2021-04-15 LAB — BASIC METABOLIC PANEL
Anion gap: 10 (ref 5–15)
BUN: 11 mg/dL (ref 6–20)
CO2: 19 mmol/L — ABNORMAL LOW (ref 22–32)
Calcium: 7.9 mg/dL — ABNORMAL LOW (ref 8.9–10.3)
Chloride: 107 mmol/L (ref 98–111)
Creatinine, Ser: 2.08 mg/dL — ABNORMAL HIGH (ref 0.44–1.00)
GFR, Estimated: 31 mL/min — ABNORMAL LOW (ref >60)
Glucose, Bld: 104 mg/dL — ABNORMAL HIGH (ref 70–99)
Potassium: 3.5 mmol/L (ref 3.5–5.1)
Sodium: 136 mmol/L (ref 135–145)

## 2021-04-15 LAB — ETHANOL: Alcohol, Ethyl (B): <10 mg/dL (ref <10)

## 2021-04-15 LAB — COMPREHENSIVE METABOLIC PANEL
ALT: 31 U/L (ref 0–44)
AST: 40 U/L (ref 15–41)
Albumin: 2.5 g/dL — ABNORMAL LOW (ref 3.5–5.0)
Alkaline Phosphatase: 89 U/L (ref 38–126)
Anion gap: 10 (ref 5–15)
BUN: 11 mg/dL (ref 6–20)
CO2: 19 mmol/L — ABNORMAL LOW (ref 22–32)
Calcium: 8 mg/dL — ABNORMAL LOW (ref 8.9–10.3)
Chloride: 107 mmol/L (ref 98–111)
Creatinine, Ser: 2.11 mg/dL — ABNORMAL HIGH (ref 0.44–1.00)
GFR, Estimated: 31 mL/min — ABNORMAL LOW (ref >60)
Glucose, Bld: 107 mg/dL — ABNORMAL HIGH (ref 70–99)
Potassium: 3.7 mmol/L (ref 3.5–5.1)
Sodium: 136 mmol/L (ref 135–145)
Total Bilirubin: 0.8 mg/dL (ref 0.3–1.2)
Total Protein: 7 g/dL (ref 6.5–8.1)

## 2021-04-15 LAB — CBC WITH DIFFERENTIAL/PLATELET
Abs Immature Granulocytes: 0.04 10*3/uL (ref 0.00–0.07)
Basophils Absolute: 0 10*3/uL (ref 0.0–0.1)
Basophils Relative: 1 %
Eosinophils Absolute: 0.1 10*3/uL (ref 0.0–0.5)
Eosinophils Relative: 1 %
HCT: 29.1 % — ABNORMAL LOW (ref 36.0–46.0)
Hemoglobin: 9 g/dL — ABNORMAL LOW (ref 12.0–15.0)
Immature Granulocytes: 1 %
Lymphocytes Relative: 24 %
Lymphs Abs: 1.6 10*3/uL (ref 0.7–4.0)
MCH: 29.8 pg (ref 26.0–34.0)
MCHC: 30.9 g/dL (ref 30.0–36.0)
MCV: 96.4 fL (ref 80.0–100.0)
Monocytes Absolute: 0.4 10*3/uL (ref 0.1–1.0)
Monocytes Relative: 6 %
Neutro Abs: 4.6 10*3/uL (ref 1.7–7.7)
Neutrophils Relative %: 67 %
Platelets: 184 10*3/uL (ref 150–400)
RBC: 3.02 MIL/uL — ABNORMAL LOW (ref 3.87–5.11)
RDW: 19 % — ABNORMAL HIGH (ref 11.5–15.5)
WBC: 6.7 10*3/uL (ref 4.0–10.5)
nRBC: 0 % (ref 0.0–0.2)

## 2021-04-15 LAB — I-STAT ARTERIAL BLOOD GAS, ED
Acid-base deficit: 4 mmol/L — ABNORMAL HIGH (ref 0.0–2.0)
Bicarbonate: 19.3 mmol/L — ABNORMAL LOW (ref 20.0–28.0)
Calcium, Ion: 1.12 mmol/L — ABNORMAL LOW (ref 1.15–1.40)
HCT: 23 % — ABNORMAL LOW (ref 36.0–46.0)
Hemoglobin: 7.8 g/dL — ABNORMAL LOW (ref 12.0–15.0)
O2 Saturation: 91 %
Patient temperature: 98.2
Potassium: 3.4 mmol/L — ABNORMAL LOW (ref 3.5–5.1)
Sodium: 140 mmol/L (ref 135–145)
TCO2: 20 mmol/L — ABNORMAL LOW (ref 22–32)
pCO2 arterial: 28.6 mmHg — ABNORMAL LOW (ref 32.0–48.0)
pH, Arterial: 7.438 (ref 7.350–7.450)
pO2, Arterial: 58 mmHg — ABNORMAL LOW (ref 83.0–108.0)

## 2021-04-15 LAB — I-STAT CHEM 8, ED
BUN: 12 mg/dL (ref 6–20)
Calcium, Ion: 1.02 mmol/L — ABNORMAL LOW (ref 1.15–1.40)
Chloride: 107 mmol/L (ref 98–111)
Creatinine, Ser: 2.1 mg/dL — ABNORMAL HIGH (ref 0.44–1.00)
Glucose, Bld: 103 mg/dL — ABNORMAL HIGH (ref 70–99)
HCT: 30 % — ABNORMAL LOW (ref 36.0–46.0)
Hemoglobin: 10.2 g/dL — ABNORMAL LOW (ref 12.0–15.0)
Potassium: 3.7 mmol/L (ref 3.5–5.1)
Sodium: 140 mmol/L (ref 135–145)
TCO2: 20 mmol/L — ABNORMAL LOW (ref 22–32)

## 2021-04-15 LAB — PROTIME-INR
INR: 1.1 (ref 0.8–1.2)
Prothrombin Time: 13.9 seconds (ref 11.4–15.2)

## 2021-04-15 LAB — FERRITIN: Ferritin: 371 ng/mL — ABNORMAL HIGH (ref 11–307)

## 2021-04-15 LAB — RAPID URINE DRUG SCREEN, HOSP PERFORMED
Amphetamines: POSITIVE — AB
Barbiturates: NOT DETECTED
Benzodiazepines: NOT DETECTED
Cocaine: NOT DETECTED
Opiates: NOT DETECTED
Tetrahydrocannabinol: NOT DETECTED

## 2021-04-15 LAB — URINALYSIS, COMPLETE (UACMP) WITH MICROSCOPIC
Bacteria, UA: NONE SEEN
Glucose, UA: NEGATIVE mg/dL
Ketones, ur: NEGATIVE mg/dL
Leukocytes,Ua: NEGATIVE
Nitrite: NEGATIVE
Protein, ur: 100 mg/dL — AB
RBC / HPF: >50 RBC/hpf (ref 0–5)
Specific Gravity, Urine: 1.015 (ref 1.005–1.030)
pH: 7 (ref 5.0–8.0)

## 2021-04-15 LAB — PREGNANCY, URINE: Preg Test, Ur: NEGATIVE

## 2021-04-15 LAB — BRAIN NATRIURETIC PEPTIDE: B Natriuretic Peptide: >4500 pg/mL — ABNORMAL HIGH (ref 0.0–100.0)

## 2021-04-15 LAB — MAGNESIUM: Magnesium: 1.4 mg/dL — ABNORMAL LOW (ref 1.7–2.4)

## 2021-04-15 LAB — RESP PANEL BY RT-PCR (FLU A&B, COVID) ARPGX2
Influenza A by PCR: NEGATIVE
Influenza B by PCR: NEGATIVE
SARS Coronavirus 2 by RT PCR: NEGATIVE

## 2021-04-15 LAB — TROPONIN I (HIGH SENSITIVITY)
Troponin I (High Sensitivity): 34 ng/L — ABNORMAL HIGH (ref <18)
Troponin I (High Sensitivity): 31 ng/L — ABNORMAL HIGH (ref <18)

## 2021-04-15 MED ORDER — MAGNESIUM SULFATE 2 GM/50ML IV SOLN
2.0000 g | Freq: Once | INTRAVENOUS | Status: AC
  Administered 2021-04-15: 2 g via INTRAVENOUS
  Filled 2021-04-15: qty 50

## 2021-04-15 MED ORDER — NITROGLYCERIN 2 % TD OINT
1.0000 [in_us] | TOPICAL_OINTMENT | Freq: Once | TRANSDERMAL | Status: DC
  Filled 2021-04-15: qty 1

## 2021-04-15 MED ORDER — DICLOFENAC SODIUM 1 % EX GEL
4.0000 g | Freq: Four times a day (QID) | CUTANEOUS | Status: DC
  Filled 2021-04-15: qty 100

## 2021-04-15 MED ORDER — IPRATROPIUM-ALBUTEROL 0.5-2.5 (3) MG/3ML IN SOLN
3.0000 mL | Freq: Once | RESPIRATORY_TRACT | Status: AC
  Administered 2021-04-16: 3 mL via RESPIRATORY_TRACT
  Filled 2021-04-15: qty 3

## 2021-04-15 MED ORDER — METHYLPREDNISOLONE SODIUM SUCC 125 MG IJ SOLR: 125.0000 mg | Freq: Once | INTRAMUSCULAR | Status: DC

## 2021-04-15 MED ORDER — HEPARIN (PORCINE) 25000 UT/250ML-% IV SOLN
1100.0000 [IU]/h | INTRAVENOUS | Status: DC
  Filled 2021-04-15: qty 250

## 2021-04-15 MED ORDER — HYDROXYZINE HCL 25 MG PO TABS
50.0000 mg | ORAL_TABLET | Freq: Three times a day (TID) | ORAL | Status: DC
  Administered 2021-04-15 – 2021-04-27 (×35): 50 mg via ORAL
  Filled 2021-04-15 (×37): qty 2

## 2021-04-15 MED ORDER — DOXYCYCLINE HYCLATE 100 MG PO TABS
100.0000 mg | ORAL_TABLET | Freq: Two times a day (BID) | ORAL | Status: DC
  Administered 2021-04-15 – 2021-04-27 (×25): 100 mg via ORAL
  Filled 2021-04-15 (×25): qty 1

## 2021-04-15 MED ORDER — ACETAMINOPHEN 325 MG PO TABS
650.0000 mg | ORAL_TABLET | Freq: Once | ORAL | Status: AC
  Administered 2021-04-15: 650 mg via ORAL
  Filled 2021-04-15: qty 2

## 2021-04-15 MED ORDER — ACETAMINOPHEN 325 MG PO TABS
650.0000 mg | ORAL_TABLET | Freq: Four times a day (QID) | ORAL | Status: DC
  Administered 2021-04-15 – 2021-04-24 (×29): 650 mg via ORAL
  Filled 2021-04-15 (×30): qty 2

## 2021-04-15 MED ORDER — SODIUM CHLORIDE 0.9 % IV SOLN: INTRAVENOUS | Status: DC | PRN

## 2021-04-15 MED ORDER — DICLOFENAC SODIUM 1 % EX GEL: 2.0000 g | Freq: Four times a day (QID) | CUTANEOUS | Status: DC | PRN

## 2021-04-15 MED ORDER — SODIUM CHLORIDE 0.9 % IV SOLN
2.0000 g | INTRAVENOUS | Status: DC
  Administered 2021-04-15 – 2021-04-16 (×2): 2 g via INTRAVENOUS
  Filled 2021-04-15 (×2): qty 20

## 2021-04-15 MED ORDER — FUROSEMIDE 10 MG/ML IJ SOLN
40.0000 mg | Freq: Once | INTRAMUSCULAR | Status: AC
  Administered 2021-04-15: 40 mg via INTRAVENOUS
  Filled 2021-04-15: qty 4

## 2021-04-15 MED ORDER — FUROSEMIDE 10 MG/ML IJ SOLN
120.0000 mg | Freq: Two times a day (BID) | INTRAVENOUS | Status: DC
  Administered 2021-04-15 – 2021-04-18 (×7): 120 mg via INTRAVENOUS
  Filled 2021-04-15: qty 10
  Filled 2021-04-15: qty 12
  Filled 2021-04-15 (×4): qty 10
  Filled 2021-04-15 (×2): qty 12
  Filled 2021-04-15: qty 2
  Filled 2021-04-15: qty 10
  Filled 2021-04-15 (×2): qty 12

## 2021-04-15 MED ORDER — DICLOFENAC SODIUM 1 % EX GEL
4.0000 g | Freq: Four times a day (QID) | CUTANEOUS | Status: DC | PRN
  Administered 2021-04-15 – 2021-04-21 (×3): 4 g via TOPICAL
  Filled 2021-04-15: qty 100

## 2021-04-15 MED ORDER — NITROGLYCERIN 2 % TD OINT: 1.0000 [in_us] | TOPICAL_OINTMENT | Freq: Once | TRANSDERMAL | Status: DC

## 2021-04-15 MED ORDER — INFLUENZA VAC SPLIT QUAD 0.5 ML IM SUSY
0.5000 mL | PREFILLED_SYRINGE | INTRAMUSCULAR | Status: AC
  Administered 2021-04-22: 0.5 mL via INTRAMUSCULAR
  Filled 2021-04-15: qty 0.5

## 2021-04-15 MED ORDER — ALBUTEROL SULFATE (2.5 MG/3ML) 0.083% IN NEBU: 3.0000 mL | INHALATION_SOLUTION | Freq: Once | RESPIRATORY_TRACT | Status: DC

## 2021-04-15 MED ORDER — HEPARIN BOLUS VIA INFUSION
2000.0000 [IU] | Freq: Once | INTRAVENOUS | Status: DC
  Filled 2021-04-15: qty 2000

## 2021-04-15 MED ORDER — FUROSEMIDE 10 MG/ML IJ SOLN
80.0000 mg | Freq: Once | INTRAMUSCULAR | Status: AC
  Administered 2021-04-15: 80 mg via INTRAVENOUS
  Filled 2021-04-15: qty 8

## 2021-04-15 MED ORDER — ACETAMINOPHEN 325 MG PO TABS: 650.0000 mg | ORAL_TABLET | Freq: Four times a day (QID) | ORAL | Status: DC | PRN

## 2021-04-15 MED ORDER — BUPRENORPHINE HCL-NALOXONE HCL 8-2 MG SL SUBL
1.0000 | SUBLINGUAL_TABLET | Freq: Every day | SUBLINGUAL | Status: DC
Start: 2021-04-16 — End: 2021-04-27
  Administered 2021-04-15 – 2021-04-27 (×13): 1 via SUBLINGUAL
  Filled 2021-04-15 (×13): qty 1

## 2021-04-15 MED ORDER — IBUPROFEN 400 MG PO TABS
400.0000 mg | ORAL_TABLET | Freq: Four times a day (QID) | ORAL | Status: DC | PRN
Start: 2021-04-15 — End: 2021-04-15

## 2021-04-15 MED ORDER — POLYETHYLENE GLYCOL 3350 17 G PO PACK: 17.0000 g | PACK | Freq: Every day | ORAL | Status: DC

## 2021-04-15 MED ORDER — NITROGLYCERIN IN D5W 200-5 MCG/ML-% IV SOLN
0.0000 ug/min | INTRAVENOUS | Status: DC
  Administered 2021-04-15: 5 ug/min via INTRAVENOUS
  Filled 2021-04-15: qty 250

## 2021-04-15 MED ORDER — NICOTINE 21 MG/24HR TD PT24
21.0000 mg | MEDICATED_PATCH | Freq: Every day | TRANSDERMAL | Status: DC
  Administered 2021-04-15 – 2021-04-27 (×13): 21 mg via TRANSDERMAL
  Filled 2021-04-15 (×13): qty 1

## 2021-04-15 MED ORDER — QUETIAPINE FUMARATE 50 MG PO TABS
50.0000 mg | ORAL_TABLET | Freq: Every day | ORAL | Status: DC
  Administered 2021-04-15 – 2021-04-26 (×12): 50 mg via ORAL
  Filled 2021-04-15 (×12): qty 1

## 2021-04-15 NOTE — ED Provider Notes (Signed)
[facility] EMERGENCY DEPARTMENT Provider Note   CSN: [id number] Arrival date & time: 04/15/21  1005     History Chief Complaint  Patient presents with   Shortness of Breath    Robyn Shaffer is a 35 y.o. female.  HPI Patient presents for shortness of breath.  Onset was yesterday evening around 4 PM.  Patient had tightness in her chest and pleuritic chest pain.  She tried a family members albuterol inhalers and tried to simply calm her self down through the night.  She was up throughout the night with increased work of breathing.  EMS was called this morning.  When they arrived, patient was unable to speak in complete sentences.  She had diffuse wheezing throughout all lung fields.  She has no known history of asthma or COPD.  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  This was in [place].  Patient has since moved to the local area.  She has been on warfarin and Bumex since her surgery.  She reports medication compliance.  She reports that BLE edema has significantly improved.  EMS provided 0.5 of Atrovent and 10 of albuterol.  Patient does endorse improved breathing with these treatments.  Patient was initially 88% on room air.  On the 8 L with the nebulized breathing treatments, SPO2 was 100%.  BP and heart rate was elevated during transit.    Past Medical History:  Diagnosis Date   Endocarditis    Kidney infection    Polysubstance abuse [facility])     Patient Active Problem List   Diagnosis Date Noted   Acute CHF ([place]) 04/15/2021    Past Surgical History:  Procedure Laterality Date   ABDOMINAL HYSTERECTOMY     KIDNEY STONE SURGERY     MITRAL VALVE REPLACEMENT       OB History   No obstetric history on file.     No family history on file.  Social History   Tobacco Use   Smoking status: Every Day    Types: Cigarettes  Substance Use Topics   Drug use: No    Home Medications Prior to Admission medications    Medication Sig Start Date End Date Taking? Authorizing Provider  B Complex-C-Folic Acid (VIRT-CAPS) 1 MG CAPS Take 1 capsule by mouth daily. 03/03/21   [provider]  bumetanide (BUMEX) 2 MG tablet Take by mouth. 03/13/21   [provider]  Buprenorphine HCl-Naloxone HCl 8-2 MG FILM Place under the tongue. 03/13/21   [provider]  doxycycline (VIBRA-TABS) 100 MG tablet Take 100 mg by mouth 2 (two) times daily. 03/28/21   [provider]  famotidine (PEPCID) 20 MG tablet Take 20 mg by mouth daily. 03/03/21   [provider]  furosemide (LASIX) 40 MG tablet Take 40 mg by mouth daily. 03/13/21   [provider]  GNP ADULT ASPIRIN LOW STRENGTH 81 MG chewable tablet Chew 81 mg by mouth daily. 03/03/21   [provider]  hydrOXYzine (VISTARIL) 50 MG capsule Take by mouth. 03/03/21   [provider]  melatonin 3 MG TABS tablet Take by mouth. 03/03/21   [provider]  metoprolol tartrate (LOPRESSOR) 25 MG tablet Take 25 mg by mouth 2 (two) times daily. 03/03/21   [provider]  QUEtiapine (SEROQUEL) 50 MG tablet Take 50 mg by mouth at bedtime. 03/03/21   [provider]  sodium bicarbonate 650 MG tablet Take by mouth. 03/03/21   [provider]  warfarin (COUMADIN) 2 MG tablet Take by mouth. 03/15/21   [provider]    Allergies    Vancomycin  Review of Systems   Review of Systems  Constitutional:  Positive for fatigue. Negative for chills and fever.  HENT:  Negative for ear pain and sore throat.   Eyes:  Negative for pain and visual disturbance.  Respiratory:  Positive for chest tightness, shortness of breath and wheezing. Negative for cough.   Cardiovascular:  Positive for chest pain and leg swelling. Negative for palpitations.  Gastrointestinal:  Negative for abdominal pain, diarrhea, nausea and vomiting.  Genitourinary:  Negative for dysuria, flank pain and hematuria.   Musculoskeletal:  Negative for arthralgias, back pain, myalgias and neck pain.  Skin:  Negative for color change and rash.  Neurological:  Negative for dizziness, seizures, syncope, weakness, numbness and headaches.  Hematological:  Bruises/bleeds easily (On warfarin).  Psychiatric/Behavioral:  Negative for confusion and decreased concentration.   All other systems reviewed and are negative.  Physical Exam Updated Vital Signs BP (!) 166/104 (BP Location: Left Arm)   Pulse 98   Temp 97.9 F (36.6 C) (Oral)   Resp (!) 35   Ht 5\' 9"  (1.753 m)   Wt 72.7 kg   SpO2 100%   BMI 23.67 kg/m   Physical Exam Vitals and nursing note reviewed.  Constitutional:      General: She is not in acute distress.    Appearance: She is well-developed and normal weight. She is ill-appearing. She is not toxic-appearing or diaphoretic.  HENT:     Head: Normocephalic and atraumatic.     Right Ear: External ear normal.     Left Ear: External ear normal.     Nose: Nose normal.     Mouth/Throat:     Mouth: Mucous membranes are moist.     Pharynx: Oropharynx is clear.  Eyes:     Extraocular Movements: Extraocular movements intact.     Conjunctiva/sclera: Conjunctivae normal.  Cardiovascular:     Rate and Rhythm: Regular rhythm. Tachycardia present.     Heart sounds: No murmur heard. Pulmonary:     Effort: Tachypnea, accessory muscle usage and respiratory distress present.     Breath sounds: Decreased air movement present. Wheezing present.  Abdominal:     Palpations: Abdomen is soft.     Tenderness: There is no abdominal tenderness.  Musculoskeletal:     Cervical back: Normal range of motion and neck supple. No rigidity.     Right lower leg: Edema present.     Left lower leg: Edema present.  Skin:    General: Skin is warm and dry.  Neurological:     General: No focal deficit present.     Mental Status: She is alert and oriented to person, place, and time.     Cranial Nerves: No cranial nerve  deficit.     Sensory: No sensory deficit.     Motor: No weakness.  Psychiatric:        Mood and Affect: Mood normal.        Behavior: Behavior normal.        Thought Content: Thought content normal.        Judgment: Judgment normal.    ED Results / Procedures / Treatments   Labs (all labs ordered are listed, but only abnormal results are displayed) Labs Reviewed  COMPREHENSIVE METABOLIC PANEL - Abnormal; Notable for the following components:      Result Value   CO2  19 (*)    Glucose, Bld 107 (*)    Creatinine, Ser 2.11 (*)    Calcium 8.0 (*)    Albumin 2.5 (*)    GFR, Estimated 31 (*)    All other components within normal limits  CBC WITH DIFFERENTIAL/PLATELET - Abnormal; Notable for the following components:   RBC 3.02 (*)    Hemoglobin 9.0 (*)    HCT 29.1 (*)    RDW 19.0 (*)    All other components within normal limits  BRAIN NATRIURETIC PEPTIDE - Abnormal; Notable for the following components:   B Natriuretic Peptide >4,500.0 (*)    All other components within normal limits  MAGNESIUM - Abnormal; Notable for the following components:   Magnesium 1.4 (*)    All other components within normal limits  CBC - Abnormal; Notable for the following components:   RBC 2.71 (*)    Hemoglobin 8.2 (*)    HCT 25.4 (*)    RDW 19.0 (*)    All other components within normal limits  RAPID URINE DRUG SCREEN, HOSP PERFORMED - Abnormal; Notable for the following components:   Amphetamines POSITIVE (*)    All other components within normal limits  I-STAT CHEM 8, ED - Abnormal; Notable for the following components:   Creatinine, Ser 2.10 (*)    Glucose, Bld 103 (*)    Calcium, Ion 1.02 (*)    TCO2 20 (*)    Hemoglobin 10.2 (*)    HCT 30.0 (*)    All other components within normal limits  I-STAT ARTERIAL BLOOD GAS, ED - Abnormal; Notable for the following components:   pCO2 arterial 28.6 (*)    pO2, Arterial 58 (*)    Bicarbonate 19.3 (*)    TCO2 20 (*)    Acid-base deficit 4.0 (*)     Potassium 3.4 (*)    Calcium, Ion 1.12 (*)    HCT 23.0 (*)    Hemoglobin 7.8 (*)    All other components within normal limits  TROPONIN I (HIGH SENSITIVITY) - Abnormal; Notable for the following components:   Troponin I (High Sensitivity) 34 (*)    All other components within normal limits  TROPONIN I (HIGH SENSITIVITY) - Abnormal; Notable for the following components:   Troponin I (High Sensitivity) 31 (*)    All other components within normal limits  RESP PANEL BY RT-PCR (FLU A&B, COVID) ARPGX2  CULTURE, BLOOD (ROUTINE X 2)  CULTURE, BLOOD (ROUTINE X 2)  URINE CULTURE  PROTIME-INR  BLOOD GAS, ARTERIAL  HIV ANTIBODY (ROUTINE TESTING W REFLEX)  BASIC METABOLIC PANEL  CBC  HEPARIN LEVEL (UNFRACTIONATED)  COMPREHENSIVE METABOLIC PANEL  FERRITIN  URINALYSIS, COMPLETE (UACMP) WITH MICROSCOPIC  PREGNANCY, URINE  CBC    EKG EKG Interpretation  Date/Time:  Sunday April 15 2021 10:28:49 EDT Ventricular Rate:  112 PR Interval:  129 QRS Duration: 89 QT Interval:  316 QTC Calculation: 432 R Axis:   52 Text Interpretation: Sinus tachycardia Ventricular premature complex Borderline repolarization abnormality Confirmed by Godfrey Pick ([id number]) on 04/15/2021 10:32:32 AM  Radiology DG Chest 2 View  Result Date: 04/15/2021 CLINICAL DATA:  resp distress since last night. Pt with no hx of resp issues. Pt with open heart surgery last month with swelling to abdomen and bil lower extremities. EXAM: CHEST - 2 VIEW.  Patient is rotated. COMPARISON:  Chest x-ray 04/15/2021 FINDINGS: Enlarged cardiac silhouette. The heart and mediastinal contours are unchanged. Persistent right lower to mid lung zone patchy airspace opacity. Slightly  increased airspace opacity within the retrocardiac region. No pulmonary edema. Persistent trace to small volume right pleural effusion. No left pleural effusion. No pneumothorax. No acute osseous abnormality. IMPRESSION: 1. Slightly increased airspace opacity within  the retrocardiac region. Followup PA and lateral chest X-ray is recommended in 3-4 weeks following therapy to ensure resolution. 2. Persistent right lower to mid lung zone patchy airspace opacity. 3. Persistent trace to small volume right pleural effusion. 4. Persistent enlarged cardiac silhouette. Electronically Signed   By: Iven Finn M.D.   On: 04/15/2021 16:38   DG Chest Port 1 View  Result Date: 04/15/2021 CLINICAL DATA:  Shortness of breath. History of recent open heart surgery. EXAM: PORTABLE CHEST 1 VIEW COMPARISON:  None. FINDINGS: Cardiomegaly and median sternotomy wires noted. Pulmonary vascular congestion with interstitial pulmonary edema noted. A small RIGHT pleural effusion with RIGHT LOWER lung consolidation/atelectasis noted. There is no evidence of pneumothorax. No acute bony abnormalities are present. IMPRESSION: 1. Cardiomegaly with interstitial pulmonary edema. 2. RIGHT LOWER lung consolidation/atelectasis and small RIGHT pleural effusion. Electronically Signed   By: Margarette Canada M.D.   On: 04/15/2021 11:00   VAS [place] LOWER EXTREMITY VENOUS (DVT)  Result Date: 04/15/2021  Lower Venous DVT Study Patient Name:  MONSERRATT KNEZEVIC  Date of Exam:   04/15/2021 Medical Rec #: [id number]       Accession #:    [id number] Date of Birth: September 23, 1985       Patient Gender: F Patient Age:   78 years Exam Location:  [facility] Procedure:      VAS [place] LOWER EXTREMITY VENOUS (DVT) Referring Phys: Godfrey Pick --------------------------------------------------------------------------------  Indications: Edema.  Comparison Study: No prior studies. Performing Technologist: Darlin Coco RDMS, RVT  Examination Guidelines: A complete evaluation includes B-mode imaging, spectral Doppler, color Doppler, and power Doppler as needed of all accessible portions of each vessel. Bilateral testing is considered an integral part of a complete examination. Limited examinations for reoccurring indications may be  performed as noted. The reflux portion of the exam is performed with the patient in reverse Trendelenburg.  +---------+---------------+---------+-----------+----------+--------------+ RIGHT    CompressibilityPhasicitySpontaneityPropertiesThrombus Aging +---------+---------------+---------+-----------+----------+--------------+ CFV      Full           Yes      Yes                                 +---------+---------------+---------+-----------+----------+--------------+ SFJ      Full                                                        +---------+---------------+---------+-----------+----------+--------------+ FV Prox  Full                                                        +---------+---------------+---------+-----------+----------+--------------+ FV Mid   Full                                                        +---------+---------------+---------+-----------+----------+--------------+  FV DistalFull                                                        +---------+---------------+---------+-----------+----------+--------------+ PFV      Full                                                        +---------+---------------+---------+-----------+----------+--------------+ POP      Full           Yes      Yes                                 +---------+---------------+---------+-----------+----------+--------------+ PTV      Full                                                        +---------+---------------+---------+-----------+----------+--------------+ PERO     Full                                                        +---------+---------------+---------+-----------+----------+--------------+ Gastroc  Full                                                        +---------+---------------+---------+-----------+----------+--------------+   +---------+---------------+---------+-----------+----------+--------------+ LEFT      CompressibilityPhasicitySpontaneityPropertiesThrombus Aging +---------+---------------+---------+-----------+----------+--------------+ CFV      Full           Yes      Yes                                 +---------+---------------+---------+-----------+----------+--------------+ SFJ      Full                                                        +---------+---------------+---------+-----------+----------+--------------+ FV Prox  Full                                                        +---------+---------------+---------+-----------+----------+--------------+ FV Mid   Full                                                        +---------+---------------+---------+-----------+----------+--------------+  FV DistalFull                                                        +---------+---------------+---------+-----------+----------+--------------+ PFV      Full                                                        +---------+---------------+---------+-----------+----------+--------------+ POP      Full           Yes      Yes                                 +---------+---------------+---------+-----------+----------+--------------+ PTV      Full                                                        +---------+---------------+---------+-----------+----------+--------------+ PERO     Full                                                        +---------+---------------+---------+-----------+----------+--------------+ Gastroc  Full                                                        +---------+---------------+---------+-----------+----------+--------------+     Summary: RIGHT: - There is no evidence of deep vein thrombosis in the lower extremity.  - No cystic structure found in the popliteal fossa.  LEFT: - There is no evidence of deep vein thrombosis in the lower extremity.  - No cystic structure found in the popliteal fossa.  *See  table(s) above for measurements and observations.    Preliminary     Procedures Procedures   Medications Ordered in ED Medications  doxycycline (VIBRA-TABS) tablet 100 mg (100 mg Oral Given 04/15/21 1721)  furosemide (LASIX) 120 mg in dextrose 5 % 50 mL IVPB (has no administration in time range)  influenza vac split quadrivalent PF (FLUARIX) injection 0.5 mL (has no administration in time range)  cefTRIAXone (ROCEPHIN) 2 g in sodium chloride 0.9 % 100 mL IVPB (has no administration in time range)  furosemide (LASIX) injection 40 mg (40 mg Intravenous Given 04/15/21 1216)  magnesium sulfate IVPB 2 g 50 mL (0 g Intravenous Stopped 04/15/21 1520)  acetaminophen (TYLENOL) tablet 650 mg (650 mg Oral Given 04/15/21 1505)  furosemide (LASIX) injection 80 mg (80 mg Intravenous Given 04/15/21 1720)    ED Course  I have reviewed the triage vital signs and the nursing notes.  Pertinent labs & imaging results that were available during my care of the patient were reviewed by me and considered in my medical decision making (  see chart for details).    MDM Rules/Calculators/A&P                         CRITICAL CARE Performed by: Godfrey Pick   Total critical care time: 40 minutes  Critical care time was exclusive of separately billable procedures and treating other patients.  Critical care was necessary to treat or prevent imminent or life-threatening deterioration.  Critical care was time spent personally by me on the following activities: development of treatment plan with patient and/or surrogate as well as nursing, discussions with consultants, evaluation of patient's response to treatment, examination of patient, obtaining history from patient or surrogate, ordering and performing treatments and interventions, ordering and review of laboratory studies, ordering and review of radiographic studies, pulse oximetry and re-evaluation of patient's condition.   Patient presents for acute onset of  shortness of breath last night, continuing through the night and this morning.  She did receive some relief with duo nebs in route to the hospital.  On arrival, she has labored breathing.  She is able to speak in complete sentences.  Vital signs are notable for hypertension, tachycardia, and tachypnea.  Bedside echocardiogram showed biapical B-lines and bilateral pleural effusions.  Chest x-ray is also consistent with cardiogenic pulmonary edema.  Given her increased work of breathing, BiPAP was initiated.  Patient was placed on nitroglycerin gtt.  Lab work showed a creatinine of 2.10, likely slightly increased from baseline.  Magnesium was low and was replaced in anticipation of diuresis.  Patient does report medication compliance, although she states that she has not taken any medications today.  40 mg of Lasix was ordered.  Patient's BNP was severely elevated at > 4500.  On reassessment, patient tolerating BiPAP well.  BiPAP was removed and patient continued to have improved work of breathing.  Nitro paste was ordered in effort to wean her off of gtt.  Patient's dyspnea could be multifactorial.  She does take warfarin, however, INR today was 1.1.  This does put her at risk of PE.  Given her elevated creatinine, no contrasted scan was ordered.  Heparin was ordered, given her subtherapeutic INR.  Decision for testing for PE was deferred to admitting team.  Patient was admitted to medicine for further management.  Final Clinical Impression(s) / ED Diagnoses Final diagnoses:  Dyspnea  Respiratory distress  Acute pulmonary edema [facility])    Rx / DC Orders ED Discharge Orders     None        Godfrey Pick, MD 04/15/21 1805

## 2021-04-15 NOTE — ED Notes (Addendum)
Pt mother reports hematuria for several days. Pt on coumadin.

## 2021-04-15 NOTE — ED Notes (Signed)
Admitting MD aware of hematuria

## 2021-04-15 NOTE — Progress Notes (Signed)
Per order, RT placed pt on BiPAP at 1218. RN of pt just informed this RT that MD stated pt did not need BiPAP. Pt is currently off at this time on room air and tolerating well.

## 2021-04-15 NOTE — H&P (Addendum)
[facility] Admission History and Physical Service Pager: [phone]  Patient name: Robyn Shaffer Medical record number: [id number] Date of birth: 09-07-1985 Age: 35 y.o. Gender: female  Primary Care Provider: Patient, No Pcp Per (Inactive) Consultants: None Code Status: Full   Preferred Emergency Contact: Zenda Alpers (Mother) [phone]  Chief Complaint: Shortness of breath  Assessment and Plan: Robyn Shaffer is a 35 y.o. female presenting with shortness of breath . PMH is significant for Endocarditis s/p Tricuspid valve replacement, anemia and Polysubstance abuse.  Acute Hypoxemic Respiratory failure. EMS reports patient's desating on arrival with Spo2 of 88% prompting administration of 0.5mg  of Atrovent and 10 mg of albuterol.  In the ED she was put on 8L with nebulizing treatment until her respiration improved to Spo2 of 100%. Patient is currently on room air and sating appropriately. CXR showed right lower to mid zone opacity. On exam she has diffused crackles and good WOB on RA. She reports being treated for pneumonia with antibiotics after her surgery.  There is consideration for possible unresolved pneumonia or reinfection. With bilateral LE edema there was concern for DVT which was ruled out by negative LE doppler ultrasound. Cause of patient's hypoxic respiratory failure could be multifactorial with possible pneumonia, anemia and acute CHF. -Admit to FPTS, Attending Dr. Thompson Grayer -SpO2 saturation goal is >92% -Continue ceftriaxone 2 g -Continue doxycycline 100 mg every 12 hours -PT/OT Eval and treat -Routine vitals -Continuous Cardiorespiratory monitoring -Daily Morning lab: CBC, BMP   Possible CHF  Hx of Endocarditis s/p bioprosthetic Tricuspid valve replacement Patient was admitted for 1 day history of dyspnea worse with laying supine or on the right side. On arrival in the ED patient was tachycardic, tachypneic and hypertensive. Last Echo 8/22  showed EF of 62.6%. CXR showed cardiomegaly and small pleura effusion. BNP is greater than 4500 and troponin slightly elevated 34. On exam patient has diffused crackles, intermittent wheezing,  abdominal distension, +2 BLE edema and no JVD. Patient overall presentation is still unclear but will need further work up. There is low threshold to consult cardiology. -Repeat BNP -Follow-up Echo - Is/Os -Daily Weight -Continue diuresis with lasix -Daily Lab: BMP BID, CBC -Cardiorespiratory Monitoring -Consider cardiology consult  Nephrotic Kidney Disease  AKI  On admission patient's Creatinine was 2.11 and GFR 32. There is no record of her baseline creatinine. Creatinine 2 months ago was 1.43. She was admitted about a month ago and diagnosed with nephrotic syndrome. Plan at the time was to complete a kidney biopsy, however patient report she left AMA due to death in the family. She reports having hematuria for a month and on wafarin. Denies dysuria or increased urgency or frequency. Will consider nephrology consult.  -Follow-up urine culture -Follow-up urine analysis  -Avoid nephrotoxic agent  -Consider nephrology consult -Consider renal ultrasound  Anemia Hgb on admission was 9.0. Unable to obtain her baseline hgb but her last hgb 2 months ago was 10.9. She has recent history of hematuria and a recent open heart surgery 2 months ago for tricuspid bioprosthetic valve replace which all could attribute to her anemia.  Considering her extensive history of polysubstance abuse, malnutrition  is also a possible contributing factor to her anemia. Patient has been on warfarin for about 2 months which could increase her risk of bleeding, will monitor closely for any signs of bleeding. -Continue daily lab: Daily CBC -Transfusion threshold is <7  Polysubstance abuse  Patient report history IV drug use. 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 Also has history of cocaine use. She reports being clean  for 2 months. Patient has been smoking 1 pack of cigarette a day for about 20 years. She denies alcohol use, however chart review from her previous hospitalization shows history of alcohol use disorder in remission. UDS came back positive for amphetamines. -COWs screening protocol -Nicotine patch   Hematuria Started 1 week ago.  Painless.  Not on menses. Reports was supposed to have renal biopsy for nephrotic syndrome in [place] but left AMA. Taking Warfarin 7.4mg  daily.  INR 11, subtherapeutic. History of Tobacco use.   -Hold anticoagulation -SCD's for VTE prophylaxis -urinalysis with microscopic eval -if continues to have bleeding consider urology consult -Daily CBC   FEN/GI: Heart healthy diet Prophylaxis: Holding prophylaxis due to anemia, high bleeding risk.  SCD's  Disposition: Progressive   History of Present Illness:  Robyn Shaffer is a 35 y.o. female presenting with 1 day history of Shortness of breath  Patient said she was laying down last night when she started having difficult breathing. Shortness of breath was worse when she tried to lay down flat on her back or on her right side. She denies having any chest pain but endorses productive cough with tee spoon sized clear sputum. Patient reported having swelling of her legs and abdomen since her Tricuspid Valve replacement surgery 2 months ago at [facility] medicine.   Of note patient had Tricuspid valve replacement two months ago for endocarditis at [facility] where she was admitted for a month. Patient report having pneumonia during her hospitalization and was treated with antibiotic. She developed diarrhea after the start of her antibiotics. She has a history of IV drug use mostly dilaudid and sometimes cocaine. She was recently admitted a month go for hematuria for suspicion of nephrotic kidney syndrome. Plan was to complete kidney biopsy but patient reported leaving AMA due to death in the family.   Review Of  Systems: Per HPI with the following additions:   Review of Systems  Constitutional:  Negative for chills, diaphoresis and fever.  Respiratory:  Positive for cough, shortness of breath and wheezing. Negative for chest tightness.   Cardiovascular:  Positive for leg swelling. Negative for chest pain.  Gastrointestinal:  Positive for diarrhea. Negative for abdominal pain, anal bleeding, blood in stool and constipation.  Genitourinary:  Positive for hematuria. Negative for decreased urine volume, dysuria, flank pain, frequency, pelvic pain and urgency.  Neurological:  Positive for headaches. Negative for dizziness, light-headedness and numbness.    Patient Active Problem List   Diagnosis Date Noted   Acute CHF ([place]) 04/15/2021    Past Medical History: Past Medical History:  Diagnosis Date   Endocarditis    Kidney infection    Polysubstance abuse ([place])     Past Surgical History: Past Surgical History:  Procedure Laterality Date   ABDOMINAL HYSTERECTOMY     KIDNEY STONE SURGERY     MITRAL VALVE REPLACEMENT      Social History: Social History   Tobacco Use   Smoking status: Every Day    Types: Cigarettes  Substance Use Topics   Drug use: No   Additional social history:   Please also refer to relevant sections of EMR.  Family History: No family history on file.   Allergies and Medications: Allergies  Allergen Reactions   Vancomycin Hives   No current facility-administered medications on file prior to encounter.   Current Outpatient Medications on File Prior to Encounter  Medication  Sig Dispense Refill   B Complex-C-Folic Acid (VIRT-CAPS) 1 MG CAPS Take 1 capsule by mouth daily.     bumetanide (BUMEX) 2 MG tablet Take by mouth.     Buprenorphine HCl-Naloxone HCl 8-2 MG FILM Place under the tongue.     doxycycline (VIBRA-TABS) 100 MG tablet Take 100 mg by mouth 2 (two) times daily.     famotidine (PEPCID) 20 MG tablet Take 20 mg by mouth daily.     furosemide (LASIX)  40 MG tablet Take 40 mg by mouth daily.     GNP ADULT ASPIRIN LOW STRENGTH 81 MG chewable tablet Chew 81 mg by mouth daily.     hydrOXYzine (VISTARIL) 50 MG capsule Take by mouth.     melatonin 3 MG TABS tablet Take by mouth.     metoprolol tartrate (LOPRESSOR) 25 MG tablet Take 25 mg by mouth 2 (two) times daily.     QUEtiapine (SEROQUEL) 50 MG tablet Take 50 mg by mouth at bedtime.     sodium bicarbonate 650 MG tablet Take by mouth.     warfarin (COUMADIN) 2 MG tablet Take by mouth.      Objective: BP (!) 166/104 (BP Location: Left Arm)   Pulse 98   Temp 97.9 F (36.6 C) (Oral)   Resp (!) 35   Ht 5\' 9"  (1.753 m)   Wt 72.7 kg   SpO2 100%   BMI 23.67 kg/m  Exam: General: Awake, NAD, Appear older than stated age  40: Atraumatic, No sclera injection, Moist mucus membrane  Cardiovascular: Tachycardic, no murmur,  Respiratory: diffused crackles, intermittent wheezing and good WOB on RA Gastrointestinal: Soft, distended, no tenderness  MSK: good muscle tone, +2 BLE edema,  Derm: dry, warm, healing wound on media aspect of right shin Neuro: Oriented x3, No focal neuro deficit  Psych: Normal mood and affect. Appears fatigued   Labs and Imaging: CBC BMET  Recent Labs  Lab 04/15/21 1442  WBC 7.3  HGB 8.2*  HCT 25.4*  PLT 167   Recent Labs  Lab 04/15/21 1027 04/15/21 1031 04/15/21 1120  NA 136 140 140  K 3.7 3.7 3.4*  CL 107 107  --   CO2 19*  --   --   BUN 11 12  --   CREATININE 2.11* 2.10*  --   GLUCOSE 107* 103*  --   CALCIUM 8.0*  --   --      EKG: HR 112. Sinus Tachycardia with no ST changes no previous EKG for comparison reviewed by me.   DG Chest 2 View  Result Date: 04/15/2021 CLINICAL DATA:  resp distress since last night. Pt with no hx of resp issues. Pt with open heart surgery last month with swelling to abdomen and bil lower extremities. EXAM: CHEST - 2 VIEW.  Patient is rotated. COMPARISON:  Chest x-ray 04/15/2021 FINDINGS: Enlarged cardiac  silhouette. The heart and mediastinal contours are unchanged. Persistent right lower to mid lung zone patchy airspace opacity. Slightly increased airspace opacity within the retrocardiac region. No pulmonary edema. Persistent trace to small volume right pleural effusion. No left pleural effusion. No pneumothorax. No acute osseous abnormality. IMPRESSION: 1. Slightly increased airspace opacity within the retrocardiac region. Followup PA and lateral chest X-ray is recommended in 3-4 weeks following therapy to ensure resolution. 2. Persistent right lower to mid lung zone patchy airspace opacity. 3. Persistent trace to small volume right pleural effusion. 4. Persistent enlarged cardiac silhouette. Electronically Signed   By: Iven Finn  M.D.   On: 04/15/2021 16:38   DG Chest Port 1 View  Result Date: 04/15/2021 CLINICAL DATA:  Shortness of breath. History of recent open heart surgery. EXAM: PORTABLE CHEST 1 VIEW COMPARISON:  None. FINDINGS: Cardiomegaly and median sternotomy wires noted. Pulmonary vascular congestion with interstitial pulmonary edema noted. A small RIGHT pleural effusion with RIGHT LOWER lung consolidation/atelectasis noted. There is no evidence of pneumothorax. No acute bony abnormalities are present. IMPRESSION: 1. Cardiomegaly with interstitial pulmonary edema. 2. RIGHT LOWER lung consolidation/atelectasis and small RIGHT pleural effusion. Electronically Signed   By: Margarette Canada M.D.   On: 04/15/2021 11:00   VAS [place] LOWER EXTREMITY VENOUS (DVT)  Result Date: 04/15/2021  Lower Venous DVT Study Patient Name:  CLEDA IMEL  Date of Exam:   04/15/2021 Medical Rec #: [id number]       Accession #:    [id number] Date of Birth: [birth year]       Patient Gender: F Patient Age:   82 years Exam Location:  [facility] Procedure:      VAS [place] LOWER EXTREMITY VENOUS (DVT) Referring Phys: Godfrey Pick --------------------------------------------------------------------------------  Indications:  Edema.  Comparison Study: No prior studies. Performing Technologist: Darlin Coco RDMS, RVT  Examination Guidelines: A complete evaluation includes B-mode imaging, spectral Doppler, color Doppler, and power Doppler as needed of all accessible portions of each vessel. Bilateral testing is considered an integral part of a complete examination. Limited examinations for reoccurring indications may be performed as noted. The reflux portion of the exam is performed with the patient in reverse Trendelenburg.  +---------+---------------+---------+-----------+----------+--------------+ RIGHT    CompressibilityPhasicitySpontaneityPropertiesThrombus Aging +---------+---------------+---------+-----------+----------+--------------+ CFV      Full           Yes      Yes                                 +---------+---------------+---------+-----------+----------+--------------+ SFJ      Full                                                        +---------+---------------+---------+-----------+----------+--------------+ FV Prox  Full                                                        +---------+---------------+---------+-----------+----------+--------------+ FV Mid   Full                                                        +---------+---------------+---------+-----------+----------+--------------+ FV DistalFull                                                        +---------+---------------+---------+-----------+----------+--------------+ PFV      Full                                                        +---------+---------------+---------+-----------+----------+--------------+  POP      Full           Yes      Yes                                 +---------+---------------+---------+-----------+----------+--------------+ PTV      Full                                                         +---------+---------------+---------+-----------+----------+--------------+ PERO     Full                                                        +---------+---------------+---------+-----------+----------+--------------+ Gastroc  Full                                                        +---------+---------------+---------+-----------+----------+--------------+   +---------+---------------+---------+-----------+----------+--------------+ LEFT     CompressibilityPhasicitySpontaneityPropertiesThrombus Aging +---------+---------------+---------+-----------+----------+--------------+ CFV      Full           Yes      Yes                                 +---------+---------------+---------+-----------+----------+--------------+ SFJ      Full                                                        +---------+---------------+---------+-----------+----------+--------------+ FV Prox  Full                                                        +---------+---------------+---------+-----------+----------+--------------+ FV Mid   Full                                                        +---------+---------------+---------+-----------+----------+--------------+ FV DistalFull                                                        +---------+---------------+---------+-----------+----------+--------------+ PFV      Full                                                        +---------+---------------+---------+-----------+----------+--------------+  POP      Full           Yes      Yes                                 +---------+---------------+---------+-----------+----------+--------------+ PTV      Full                                                        +---------+---------------+---------+-----------+----------+--------------+ PERO     Full                                                         +---------+---------------+---------+-----------+----------+--------------+ Gastroc  Full                                                        +---------+---------------+---------+-----------+----------+--------------+     Summary: RIGHT: - There is no evidence of deep vein thrombosis in the lower extremity.  - No cystic structure found in the popliteal fossa.  LEFT: - There is no evidence of deep vein thrombosis in the lower extremity.  - No cystic structure found in the popliteal fossa.  *See table(s) above for measurements and observations.    Preliminary      Alen Bleacher, MD 04/15/2021, 5:56 PM PGY-1, [place] [facility] pager: [phone], text pages welcome   FPTS Upper-Level Resident Addendum   I have independently interviewed and examined the patient. I have discussed the above with the original author and agree with their documentation. Please see also any attending notes.    Carollee Leitz MD PGY-3, [facility] 04/15/2021 10:18 PM  [place] Service pager: [phone] (text pages welcome through [facility])

## 2021-04-15 NOTE — Progress Notes (Signed)
ANTICOAGULATION CONSULT NOTE - Initial Consult  Pharmacy Consult for heparin Indication:  Bioprosthetic tricuspid valve replacement  No Known Allergies  Patient Measurements:   Heparin Dosing Weight: 71 kg   Vital Signs: Temp: 98.2 F (36.8 C) (10/30 1015) BP: 146/114 (10/30 1300) Pulse Rate: 107 (10/30 1300)  Labs: Recent Labs    04/15/21 1027 04/15/21 1031 04/15/21 1120 04/15/21 1228  HGB 9.0* 10.2* 7.8*  --   HCT 29.1* 30.0* 23.0*  --   PLT 184  --   --   --   LABPROT 13.9  --   --   --   INR 1.1  --   --   --   CREATININE 2.11* 2.10*  --   --   TROPONINIHS 34*  --   --  31*    CrCl cannot be calculated (Unknown ideal weight.).   Medical History: Past Medical History:  Diagnosis Date   Kidney infection     Medications:  (Not in a hospital admission)   Assessment: 46 YOF with recent bioprosthetic tricuspid valve replacement on warfarin at home. Presents to the ED with chest pain. Pharmacy consulted to start IV heparin as INR is subtherapeutic at 1.1.   Hgb low, Plt wnl. SCr elevated. Of note, patient is experiencing some hematuria per RN but MD is aware. Will aim for low goal and avoid boluses.   Goal of Therapy:  Heparin level 0.3-0.5 units/ml Monitor platelets by anticoagulation protocol: Yes   Plan:  -Start IV heparin at 1100 units/hr. No bolus  -F/u 6 hr anti-Xa level -Monitor daily anti-Xa level, CBC and s/s of bleeding   Albertina Parr, PharmD., BCPS, BCCCP Clinical Pharmacist Please refer to [facility] for unit-specific pharmacist

## 2021-04-15 NOTE — Progress Notes (Signed)
Lower extremity venous bilateral study completed.   Please see CV Proc for preliminary results.   Khy Pitre, RDMS, RVT  

## 2021-04-15 NOTE — Progress Notes (Signed)
   04/15/21 1702  Assess: MEWS Score  Temp 97.9 F (36.6 C)  BP (!) 166/104  Pulse Rate 98  ECG Heart Rate 98  Resp (!) 35  Level of Consciousness Alert  SpO2 100 %  O2 Device Room Air  Assess: MEWS Score  MEWS Temp 0  MEWS Systolic 0  MEWS Pulse 0  MEWS RR 2  MEWS LOC 0  MEWS Score 2  MEWS Score Color Yellow  Assess: if the MEWS score is Yellow or Red  Were vital signs taken at a resting state? Yes  Focused Assessment No change from prior assessment  Early Detection of Sepsis Score *See Row Information* Low  MEWS guidelines implemented *See Row Information* Yes  Treat  MEWS Interventions Administered scheduled meds/treatments  Take Vital Signs  Increase Vital Sign Frequency  Yellow: Q 2hr X 2 then Q 4hr X 2, if remains yellow, continue Q 4hrs  Notify: Charge Nurse/RN  Name of Charge Nurse/RN Notified Linda, RN  Date Charge Nurse/RN Notified 04/15/21  Time Charge Nurse/RN Notified 1702  Notify: Provider  Provider Name/Title Jenny Reichmann, Norbert/MD  Date Provider Notified 04/15/21  Time Provider Notified 1800  Notification Type Page  Notification Reason Change in status  Provider response See new orders  Date of Provider Response 04/15/21  Time of Provider Response 2334  Document  Patient Outcome Other (Comment) (Monitoring)

## 2021-04-15 NOTE — ED Notes (Signed)
Zenda Alpers, mother, [phone]

## 2021-04-15 NOTE — ED Triage Notes (Signed)
Pt bib EMS with reports of resp distress since last night. Pt with no hx of resp issues. Pt with open heart surgery last month with swelling to abdomen and bil lower extremities. Wheezing in all fields. 10mg  albuterol and 0.5mg  atrovent given en route. Pt 88% RA initially. 100% on Neb. HR 110 ST 100% Neb 160/100 CBG 124

## 2021-04-15 NOTE — Care Management (Signed)
Received message regarding consult for out of state medicaid. Patient has no PCP moved from [facility], had recent MVR for endocarditis. Will consult FC to assist, patient will be admitted.  Called patient and mothers phone , unable to get through or leave message. Messaged RN to communicate with mother if in room

## 2021-04-15 NOTE — Progress Notes (Signed)
Message received from secretary and Murray Hodgkins, RN that patient's mother wanted to speak with the charge nurse and to call her, Otila Kluver, at [phone].  Obtained verbal consent from Ms. Merida that is was okay to call her mother to discuss concerns.  Pt provided verbal consent.  Patient mother stated she is calling to inquire as to why her daughter was receiving tylenol for pain.  She provided medications daughter was taken prior to admission and stated follow up appts in [place] were not made due to an unexpected death in the family.  Acknowledged her concern and let her know I would reach out to MD.  Spoke with night coverage MD and she stated they start with tylenol and would evaluate response to tylenol and go from there.  She also stated they were in conference with day team and will round on patient to evaluate.  Requested U/S to perform testing at bedside for closer monitoring also.  Placed a follow up call to mother and provided an update.  Updated patient at bedside and Murray Hodgkins, RN as well.

## 2021-04-16 ENCOUNTER — Inpatient Hospital Stay (HOSPITAL_COMMUNITY): Payer: Medicaid - Out of State

## 2021-04-16 ENCOUNTER — Encounter (HOSPITAL_COMMUNITY): Payer: Self-pay | Admitting: Student

## 2021-04-16 DIAGNOSIS — R0609 Other forms of dyspnea: Secondary | ICD-10-CM

## 2021-04-16 DIAGNOSIS — Z9889 Other specified postprocedural states: Secondary | ICD-10-CM

## 2021-04-16 DIAGNOSIS — R0600 Dyspnea, unspecified: Secondary | ICD-10-CM

## 2021-04-16 DIAGNOSIS — R0603 Acute respiratory distress: Secondary | ICD-10-CM | POA: Diagnosis not present

## 2021-04-16 DIAGNOSIS — I330 Acute and subacute infective endocarditis: Secondary | ICD-10-CM | POA: Diagnosis not present

## 2021-04-16 DIAGNOSIS — J810 Acute pulmonary edema: Secondary | ICD-10-CM

## 2021-04-16 DIAGNOSIS — R319 Hematuria, unspecified: Secondary | ICD-10-CM | POA: Diagnosis not present

## 2021-04-16 DIAGNOSIS — N184 Chronic kidney disease, stage 4 (severe): Secondary | ICD-10-CM

## 2021-04-16 DIAGNOSIS — R058 Other specified cough: Secondary | ICD-10-CM

## 2021-04-16 DIAGNOSIS — I5021 Acute systolic (congestive) heart failure: Secondary | ICD-10-CM

## 2021-04-16 LAB — COMPREHENSIVE METABOLIC PANEL
ALT: 25 U/L (ref 0–44)
AST: 31 U/L (ref 15–41)
Albumin: 1.9 g/dL — ABNORMAL LOW (ref 3.5–5.0)
Alkaline Phosphatase: 74 U/L (ref 38–126)
Anion gap: 9 (ref 5–15)
BUN: 14 mg/dL (ref 6–20)
CO2: 21 mmol/L — ABNORMAL LOW (ref 22–32)
Calcium: 7.6 mg/dL — ABNORMAL LOW (ref 8.9–10.3)
Chloride: 106 mmol/L (ref 98–111)
Creatinine, Ser: 2.23 mg/dL — ABNORMAL HIGH (ref 0.44–1.00)
GFR, Estimated: 29 mL/min — ABNORMAL LOW (ref >60)
Glucose, Bld: 112 mg/dL — ABNORMAL HIGH (ref 70–99)
Potassium: 3.5 mmol/L (ref 3.5–5.1)
Sodium: 136 mmol/L (ref 135–145)
Total Bilirubin: 0.7 mg/dL (ref 0.3–1.2)
Total Protein: 5.5 g/dL — ABNORMAL LOW (ref 6.5–8.1)

## 2021-04-16 LAB — HEPATITIS PANEL, ACUTE
HCV Ab: REACTIVE — AB
Hep A IgM: NONREACTIVE
Hep B C IgM: NONREACTIVE
Hepatitis B Surface Ag: NONREACTIVE

## 2021-04-16 LAB — BASIC METABOLIC PANEL
Anion gap: 7 (ref 5–15)
Anion gap: 9 (ref 5–15)
BUN: 14 mg/dL (ref 6–20)
BUN: 16 mg/dL (ref 6–20)
CO2: 22 mmol/L (ref 22–32)
CO2: 22 mmol/L (ref 22–32)
Calcium: 7.6 mg/dL — ABNORMAL LOW (ref 8.9–10.3)
Calcium: 8.1 mg/dL — ABNORMAL LOW (ref 8.9–10.3)
Chloride: 106 mmol/L (ref 98–111)
Chloride: 107 mmol/L (ref 98–111)
Creatinine, Ser: 2.21 mg/dL — ABNORMAL HIGH (ref 0.44–1.00)
Creatinine, Ser: 2.28 mg/dL — ABNORMAL HIGH (ref 0.44–1.00)
GFR, Estimated: 29 mL/min — ABNORMAL LOW (ref >60)
GFR, Estimated: 28 mL/min — ABNORMAL LOW (ref >60)
Glucose, Bld: 86 mg/dL (ref 70–99)
Glucose, Bld: 78 mg/dL (ref 70–99)
Potassium: 3.8 mmol/L (ref 3.5–5.1)
Potassium: 3.6 mmol/L (ref 3.5–5.1)
Sodium: 135 mmol/L (ref 135–145)
Sodium: 138 mmol/L (ref 135–145)

## 2021-04-16 LAB — BLOOD CULTURE ID PANEL (REFLEXED) - BCID2

## 2021-04-16 LAB — ECHOCARDIOGRAM COMPLETE
AV Mean grad: 3 mmHg
AV Peak grad: 5.4 mmHg
Ao pk vel: 1.16 m/s
Height: 69 in
S' Lateral: 4 cm
Weight: 2645.52 oz

## 2021-04-16 LAB — URINALYSIS, ROUTINE W REFLEX MICROSCOPIC
Bilirubin Urine: NEGATIVE
Glucose, UA: NEGATIVE mg/dL
Ketones, ur: NEGATIVE mg/dL
Leukocytes,Ua: NEGATIVE
Nitrite: NEGATIVE
Protein, ur: 100 mg/dL — AB
RBC / HPF: >50 RBC/hpf — ABNORMAL HIGH (ref 0–5)
Specific Gravity, Urine: 1.008 (ref 1.005–1.030)
pH: 6 (ref 5.0–8.0)

## 2021-04-16 LAB — URINE CULTURE: Culture: <10000 — AB

## 2021-04-16 LAB — CBC
HCT: 25.2 % — ABNORMAL LOW (ref 36.0–46.0)
Hemoglobin: 8 g/dL — ABNORMAL LOW (ref 12.0–15.0)
MCH: 30 pg (ref 26.0–34.0)
MCHC: 31.7 g/dL (ref 30.0–36.0)
MCV: 94.4 fL (ref 80.0–100.0)
Platelets: 144 10*3/uL — ABNORMAL LOW (ref 150–400)
RBC: 2.67 MIL/uL — ABNORMAL LOW (ref 3.87–5.11)
RDW: 19.2 % — ABNORMAL HIGH (ref 11.5–15.5)
WBC: 5.6 10*3/uL (ref 4.0–10.5)
nRBC: 0 % (ref 0.0–0.2)

## 2021-04-16 LAB — HIV ANTIBODY (ROUTINE TESTING W REFLEX): HIV Screen 4th Generation wRfx: NONREACTIVE

## 2021-04-16 LAB — PROTEIN / CREATININE RATIO, URINE
Creatinine, Urine: 36.46 mg/dL
Protein Creatinine Ratio: 5.38 mg/mg{Cre} — ABNORMAL HIGH (ref 0.00–0.15)
Total Protein, Urine: 196 mg/dL

## 2021-04-16 MED ORDER — POTASSIUM CHLORIDE CRYS ER 20 MEQ PO TBCR
40.0000 meq | EXTENDED_RELEASE_TABLET | Freq: Once | ORAL | Status: AC
  Administered 2021-04-16: 40 meq via ORAL
  Filled 2021-04-16: qty 2

## 2021-04-16 MED ORDER — TECHNETIUM TO 99M ALBUMIN AGGREGATED
4.4000 | Freq: Once | INTRAVENOUS | Status: AC | PRN
  Administered 2021-04-16: 4.4 via INTRAVENOUS

## 2021-04-16 NOTE — Evaluation (Signed)
Physical Therapy Evaluation Patient Details Name: Robyn Shaffer MRN: [id number] DOB: Aug 09, 1985 Today's Date: 04/16/2021  History of Present Illness  35 yo female presenting to ED on 10/30 with shortness of breath. PMH including endocarditis, kidney infection, polysubstance abuse, and mitral valve replacement.  Clinical Impression  Patient evaluated by Physical Therapy with no further acute PT needs identified. All education has been completed and the patient has no further questions. Pt is mod I to independent with mobility at this time. Pt has no follow-up Physical Therapy or equipment needs at this time. PT is signing off. Thank you for this referral.        Recommendations for follow up therapy are one component of a multi-disciplinary discharge planning process, led by the attending physician.  Recommendations may be updated based on patient status, additional functional criteria and insurance authorization.  Follow Up Recommendations No PT follow up    Assistance Recommended at Discharge PRN  Functional Status Assessment Patient has not had a recent decline in their functional status  Equipment Recommendations  None recommended by PT       Precautions / Restrictions Precautions Precautions: None Restrictions Weight Bearing Restrictions: No      Mobility  Bed Mobility Overal bed mobility: Independent                  Transfers Overall transfer level: Modified independent                      Ambulation/Gait Ambulation/Gait assistance: Modified independent (Device/Increase time) Gait Distance (Feet): 450 Feet Assistive device: None Gait Pattern/deviations: Step-through pattern;WFL(Within Functional Limits) Gait velocity: WFL Gait velocity interpretation: >2.62 ft/sec, indicative of community ambulatory General Gait Details: slow, steady ambulation         Balance Overall balance assessment: No apparent balance deficits (not formally assessed)                                            Pertinent Vitals/Pain Pain Assessment: No/denies pain    Home Living Family/patient expects to be discharged to:: Private residence Living Arrangements: Parent Available Help at Discharge: Family;Available 24 hours/day Type of Home: House Home Access: Level entry       Home Layout: One level Home Equipment: None      Prior Function Prior Level of Function : Independent/Modified Independent               ADLs Comments: Does ADLs and IADLs. Not working or driving. Mother assists as well     Hand Dominance   Dominant Hand: Right    Extremity/Trunk Assessment   Upper Extremity Assessment Upper Extremity Assessment: Defer to OT evaluation    Lower Extremity Assessment Lower Extremity Assessment: Overall WFL for tasks assessed    Cervical / Trunk Assessment Cervical / Trunk Assessment: Normal  Communication   Communication: No difficulties  Cognition Arousal/Alertness: Awake/alert Behavior During Therapy: WFL for tasks assessed/performed Overall Cognitive Status: Within Functional Limits for tasks assessed                                 General Comments: Able to answer st memory questions, naming three animals that start with C, and money management question without difficulty        General Comments General comments (skin integrity, edema,  etc.): HR 103-118bpm with ambulation SaO2 on RA 92-98%O2        Assessment/Plan    PT Assessment Patient does not need any further PT services         PT Goals (Current goals can be found in the Care Plan section)  Acute Rehab PT Goals Patient Stated Goal: go home PT Goal Formulation: All assessment and education complete, DC therapy     AM-PAC PT "6 Clicks" Mobility  Outcome Measure Help needed turning from your back to your side while in a flat bed without using bedrails?: None Help needed moving from lying on your back to sitting on  the side of a flat bed without using bedrails?: None Help needed moving to and from a bed to a chair (including a wheelchair)?: None Help needed standing up from a chair using your arms (e.g., wheelchair or bedside chair)?: None Help needed to walk in hospital room?: None Help needed climbing 3-5 steps with a railing? : None 6 Click Score: 24    End of Session   Activity Tolerance: Patient tolerated treatment well Patient left: in chair;with call bell/phone within reach;with chair alarm set;with nursing/sitter in room Nurse Communication: Mobility status PT Visit Diagnosis: Difficulty in walking, not elsewhere classified (R26.2)    Time: 4098-1191 PT Time Calculation (min) (ACUTE ONLY): 15 min   Charges:   PT Evaluation $PT Eval Moderate Complexity: 1 Mod          Yanelly Cantrelle B. Migdalia Dk PT, DPT Acute Rehabilitation Services Pager [phone] Office [phone]   [facility] 04/16/2021, 2:24 PM

## 2021-04-16 NOTE — Consult Note (Signed)
Nephrology Consult   Requesting provider: [facility] Service requesting consult: Family Medicine Reason for consult: AKI, hematuria   Assessment/Recommendations: Robyn Shaffer is a/an 35 y.o. female with a past medical history heroine use disorder currently in remission, infective endocarditis s/p TV replacement, and nephrolithiasis  who present w/ hematuria, hypoxia and heart failure   Non-Oliguric AKI w/ concern for nephritic syndrome: Nephritic syndrome doesn't often cause macroscopic hematuria but it can occur. Urine sediment evaluation without obvious signs of GN (minimal dysmorphic hematuria and no obvious cellular casts) but given the reported significant proteinuria and ongoing hematuria without an obvious post-renal source for bleeding she deserves a kidney biopsy.  Differential is broad but my money is on peri-infectious GN. Would hold on any immunosuppression until all sources of infection have been ruled out unless kidney function worsens quickly. -IR consult for kidney biopsy -Serologies ordered today -Continue working up all infection sources; given her pericardial effusion and lung abnormalities she may benefit from ID consult. On ceftriaxone and doxycycline. -Agree with diuresis on lasix 120mg  BID -If hematuria worsens or clots are passed may consider renal CT and urology involvement -Continue to monitor daily Cr, Dose meds for GFR -Monitor Daily I/Os, Daily weight  -Maintain MAP>65 for optimal renal perfusion.  -Avoid nephrotoxic medications including NSAIDs  -Currently no indication for HD  CHF exacerbation: acute w/ systolic dysfunction. Diuretics as above. Cardiology involved. Patient feels like it is improving.  Hx of endocarditis/Tricuspid valve replacement: On doxycyline and ceftriaxone. Bcx negative so far. CTM.  Hypertension: Normocytic anemia: Hemoglobin 8.  Likely multifactorial related to inflammation.  Management per primary  H/o opioid use disorder: no  recent use. On suboxone.   Recommendations conveyed to primary service.    [facility] 04/16/2021 3:33 PM   _____________________________________________________________________________________ CC: AKI, hematuria  History of Present Illness: Robyn Shaffer is a/an 35 y.o. female with a past medical history of heroine use disorder currently in remission, infective endocarditis s/p TV replacement, and nephrolithiasis who presents with AKI, hematuria, and SOB.  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  She underwent tricuspid replacement and received antibiotics.  She had known pulmonary septic emboli.  She also had acute renal failure during this hospitalization requiring dialysis but the kidneys did recover prior to discharge.  She has not used IV drugs since leaving the hospital.  She then went to [facility] for ongoing symptoms related to shortness of breath and volume overload.  She had a 24-hour urine collection that she reports showed 10 g of proteinuria.  It was suggested that she have a kidney biopsy but a family member committed suicide and she felt like she needed to leave to address the immediate family concern.  She ultimately moved in with her mother here in [place].  She then came to the hospital yesterday for ongoing symptoms regarding volume overload and hematuria.  She states that the hematuria has been only taking place for 2 weeks.  She has not passed any clots.  She describes it as bright red.  She denies any current fevers but does feel fairly tired.  She has some shortness of breath, wheezing.  She has ongoing lower extremity edema but admits that it is much better than it was.  She also has had diarrhea for couple weeks that is nonbloody.  She denies NSAID use  Creatinine was  2.1 on admission and has been fairly stable around this number.  Urinalysis did demonstrate blood and protein.  It is notable that her creatinine was 1.4 in August 2022.  She also had a CT renal stone study in August that essentially showed no issues with the kidney.  She had a renal ultrasound yesterday that demonstrated diffusely increased bilateral renal parenchymal echogenicity and slight fullness of the right renal pelvis without any obvious stones.  The left kidney is mildly enlarged as well.  No frank hydronephrosis.  Chest imaging has demonstrated ongoing signs of septic emboli.  Echocardiogram demonstrated global hypokinesis with mildly decreased ejection fraction as well as a pericardial effusion.  She states that when she was 18 she was having issues with hematuria and recurrent urinary tract infections related to recurrent kidney stones.  She had several procedures to remove the stones but has not had any stones in several years.    Medications:  Current Facility-Administered Medications  Medication Dose Route Frequency Provider Last Rate Last Admin   0.9 %  sodium chloride infusion   Intravenous PRN Lenoria Chime, MD   Stopped at 04/16/21 [phone]   acetaminophen (TYLENOL) tablet 650 mg  650 mg Oral Q6H Carollee Leitz, MD   650 mg at 04/16/21 1449   buprenorphine-naloxone (SUBOXONE) 8-2 mg per SL tablet 1 tablet  1 tablet Sublingual Daily Dameron, Luna Fuse, DO   1 tablet at 04/16/21 0943   cefTRIAXone (ROCEPHIN) 2 g in sodium chloride 0.9 % 100 mL IVPB  2 g Intravenous Q24H Carollee Leitz, MD   Paused at 04/15/21 2213   diclofenac Sodium (VOLTAREN) 1 % topical gel 4 g  4 g Topical QID PRN Lenoria Chime, MD   4 g at 04/15/21 2256   doxycycline (VIBRA-TABS) tablet 100 mg  100 mg Oral Q12H Carollee Leitz, MD   100 mg at 04/16/21 3762   furosemide (LASIX) 120 mg in dextrose 5 % 50 mL IVPB  120 mg Intravenous Q12H Carollee Leitz, MD 62 mL/hr at 04/16/21 1000 Infusion Verify at 04/16/21 1000    hydrOXYzine (ATARAX/VISTARIL) tablet 50 mg  50 mg Oral TID Orvis Brill, DO   50 mg at 04/16/21 1510   influenza vac split quadrivalent PF (FLUARIX) injection 0.5 mL  0.5 mL Intramuscular Tomorrow-1000 Lenoria Chime, MD       nicotine (NICODERM CQ - dosed in mg/24 hours) patch 21 mg  21 mg Transdermal Daily Alen Bleacher, MD   21 mg at 04/16/21 0950   QUEtiapine (SEROQUEL) tablet 50 mg  50 mg Oral QHS Dameron, Luna Fuse, DO   50 mg at 04/15/21 2254     ALLERGIES Vancomycin  MEDICAL HISTORY Past Medical History:  Diagnosis Date   Endocarditis    s/p tricuspid valve replacement 01/2021   Kidney infection    Polysubstance abuse [facility])      SOCIAL HISTORY Social History   Socioeconomic History   Marital status: Legally Separated    Spouse name: Not on file   Number of children: Not on file   Years of education: Not on file   Highest education level: Not on file  Occupational History   Not on file  Tobacco Use   Smoking status: Every Day    Types: Cigarettes   Smokeless tobacco: Not on file  Substance and Sexual Activity   Alcohol use: Not on file    Comment: rarely   Drug use: No   Sexual  activity: Not on file  Other Topics Concern   Not on file  Social History Narrative   Not on file   Social Determinants of Health   Financial Resource Strain: Not on file  Food Insecurity: Not on file  Transportation Needs: Not on file  Physical Activity: Not on file  Stress: Not on file  Social Connections: Not on file  Intimate Partner Violence: Not on file     FAMILY HISTORY Family History  Problem Relation Age of Onset   Hypertension Mother    Hypertension Father    Heart attack Maternal Grandfather       Review of Systems: 12 systems reviewed Otherwise as per HPI, all other systems reviewed and negative  Physical Exam: Vitals:   04/16/21 1210 04/16/21 1220  BP:  (!) 139/105  Pulse: 87 92  Resp:  20  Temp:  98.1 F (36.7 C)  SpO2:  100%   Total I/O In:  7.5 [I.V.:0.3; IV Piggyback:7.2] Out: 400 [Urine:400]  Intake/Output Summary (Last 24 hours) at 04/16/2021 1533 Last data filed at 04/16/2021 1350 Gross per 24 hour  Intake 839.87 ml  Output 1400 ml  Net -560.13 ml   General: Chronically ill-appearing, sitting up in bed, no distress HEENT: anicteric sclera, oropharynx clear without lesions CV: Normal rate, no rub Lungs: Coarse bilateral breath sounds with bilateral crackles and wheezing that is scattered throughout Abd: soft, non-tender, mild tenderness to palpation Skin: Scattered excoriations with no obvious signs of erythema Psych: alert, engaged, appropriate mood and affect Musculoskeletal: no obvious deformities Neuro: normal speech, no gross focal deficits   Urine sediment evaluation 04/16/2021: I personally evaluated the urine sediment today Numerous dimorphic material possibly Candida.  No budding yeast that are obvious.  Numerous RBCs about 30-50 per high-powered field with only about 5 to 10% that are dysmorphic.  5-10 WBCs per high-powered field.  Several granular casts with what looks like overlying RBCs and WBCs but cellular casts are possible  Test Results Reviewed Lab Results  Component Value Date   NA 135 04/16/2021   K 3.8 04/16/2021   CL 106 04/16/2021   CO2 22 04/16/2021   BUN 14 04/16/2021   CREATININE 2.21 (H) 04/16/2021   CALCIUM 7.6 (L) 04/16/2021   ALBUMIN 1.9 (L) 04/16/2021     I have reviewed all relevant outside healthcare records related to the patient's current hospitalization

## 2021-04-16 NOTE — Progress Notes (Signed)
Mobility Specialist Progress Note    04/16/21 1740  Mobility  Activity Ambulated in hall  Level of Assistance Independent  Assistive Device None  Distance Ambulated (ft) 470 ft  Mobility Ambulated independently in hallway  Mobility Response Tolerated well  Mobility performed by Mobility specialist  $Mobility charge 1 Mobility   Pt received in chair and agreeable. C/o having mucous in cough. Encouraged pursed lip breathing on walk. Pt returned to bed with call bell in reach.   Hildred Alamin Mobility Specialist  Mobility Specialist Phone: [phone]

## 2021-04-16 NOTE — Consult Note (Addendum)
The patient has been seen in conjunction with Vin Bhagat, PAC. All aspects of care have been considered and discussed. The patient has been personally interviewed, examined, and all clinical data has been reviewed.  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 Bacteremia never cleared prior to surgery. Oral warfarin x 3 months post TVR (bioprosthesis). Now has progressive DOE and orthopnea. Clinically volume overloaded although pulmonary exam is tricky du to lung damage from multiple septic pulmonary emboli. EF on ECHO 45-50%. BNP > 4500 on 04/15/2021. Plan IV loop diuretic to achieve diuresis being care to monitor kidney function. Agree with dose ordered by nephrology. Check inflammatory marker to determine if recurrent smoldering infection. Continue coumadin unless bleeding, for total 3 months post TVR. DC end of November 2022.   Cardiology Consultation:   Patient ID: Robyn Shaffer MRN: [id number]; DOB: 11/14/1985  Admit date: 04/15/2021 Date of Consult: 04/16/2021  PCP:  Patient, No Pcp Per (Inactive)   [facility] Cardiologist:  New   Patient Profile:   Robyn Shaffer is a 35 y.o. female with a hx of recent long admission for tricuspid valve endocarditis with septic pulmonary emboli and AKI s/p tricuspid valve replacement, IV drug use, alcohol and tobacco abuse who is being seen 04/16/2021 for the evaluation of CHF at the request of Dr. Thompson Grayer.  Admitted 8/22-9/16 at [facility] as transfer from [facility] for drug overdose.  She required intubation and temporary dialysis.  Work-up revealed tricuspid valve endocarditis and MRSA bacteremia.  She underwent bioprosthetic tricuspid valve replacement with primary closure of patent foramen ovale on 02/13/2021.  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  Hospital course complicated by pleural  effusion s/p right chest tube insertion which further complicated by pneumothorax as well as multiple septic emboli in bilateral lungs.  She was discharged on oral antibiotics and warfarin for anticoagulation.  Plan was to follow-up with nephrology in outpatient setting but did not due to concern for nephrotic syndrome.   History of Present Illness:   Ms. Robyn Shaffer reports moving to [facility] to live with her mother few days ago.  He is not taking her Coumadin for past 3 to 4 as she ran out otherwise reports compliance with medication.  Noncompliant with low-sodium diet.  Denies use of IV drug usage or cocaine.  Since her surgery, patient has shortness of breath which is exacerbated with activity.  Patient has worsening shortness of breath with orthopnea , lower extremity edema and PND leading to EMS call.  No improvement despite using home nebulizer.  She was found hypoxic at 88% on room air.  Required nonrebreather with improved oxygenation.  He was admitted for acute hypoxic respiratory failure, possible pneumonia with CHF.  She is given IV Lasix and antibiotic.  Patient reported hematuria.  Scr 2.11>>2.1>>2.08>>2.23>>2.21 BNP > 4500 Hs-troponin 34>>31 Hgb 9>>10.2>>7.8>>8.2 Ferritin 371 Urine drug screen positive for amphetamines  Echo 04/16/21 1. Left ventricular ejection fraction, by estimation, is 45 to 50%. The  left ventricle has mildly decreased function. The left ventricle  demonstrates global hypokinesis. There is mild concentric left ventricular  hypertrophy. Left ventricular diastolic  function could not be evaluated.   2. Right ventricular systolic function is normal. The right ventricular  size is normal. Tricuspid regurgitation signal is inadequate for assessing  PA pressure.   3. The pericardial effusion is anterior to the right ventricle.   4. The mitral valve  is normal in structure. Trivial mitral valve  regurgitation. No evidence of mitral stenosis.   5. The tricuspid valve  is has been repaired/replaced.   6. The aortic valve is tricuspid. Aortic valve regurgitation is trivial.  Mild aortic valve sclerosis is present, with no evidence of aortic valve  stenosis. Aortic valve mean gradient measures 3.0 mmHg. Aortic valve Vmax  measures 1.16 m/s.   7. The inferior vena cava is dilated in size with <50% respiratory  variability, suggesting right atrial pressure of 15 mmHg.   8. No prior images to compare side by side. Compared to report of Aug  2022, LVF appears to have declined.   Echo 02/26/2021 Findings  Left Ventricle:   Normal left ventricular size. Normal geometry. Left ventricular systolic function is normal. Ejection Fraction is 61.2 %. No segmental/regional wall motion abnormalities identified.  Left ventricular diastolic parameters are normal.  Right Ventricle:   Normal right ventricular size. Normal right ventricular systolic function. Right ventricular systolic pressure is indeterminate.  Left Atrium:   The left atrium is normal in size.  Right Atrium:   The right atrium is of normal size.  Mitral Valve:   The mitral valve is not well visualized.  Tricuspid Valve:   A 33 mm bioprosthesis is present in the tricuspid position. There is normal leaflet motion of the tricuspid prosthesis. No paravalvular tricuspid regurgitation present.  Aortic Valve:   The mean gradient across the aortic valve is 4.0 mmHg . No Aortic valve stenosis.  Pulmonic Valve:   The pulmonic valve is normal.  Pulmonary Artery:   The pulmonary artery appears normal.  Atrial Septum:   Agitated saline bubble study is negative for intracardiac shunt.  IVC/Hepatic Veins:   Normal IVC size with >50% inspiratory collapse (estimated RA pressure 3 mmHg).  Aorta:   The aortic root is of normal size. The ascending aorta is normal in size.  Pericardium/Pleural space:   There is a trivial pericardial effusion.   Past Medical History:  Diagnosis Date   Endocarditis    s/p tricuspid valve  replacement 01/2021   Kidney infection    Polysubstance abuse [facility])     Past Surgical History:  Procedure Laterality Date   ABDOMINAL HYSTERECTOMY     KIDNEY STONE SURGERY     MITRAL VALVE REPLACEMENT       Inpatient Medications: Scheduled Meds:  acetaminophen  650 mg Oral Q6H   buprenorphine-naloxone  1 tablet Sublingual Daily   doxycycline  100 mg Oral Q12H   hydrOXYzine  50 mg Oral TID   influenza vac split quadrivalent PF  0.5 mL Intramuscular Tomorrow-1000   nicotine  21 mg Transdermal Daily   QUEtiapine  50 mg Oral QHS   Continuous Infusions:  sodium chloride Stopped (04/16/21 0953)   cefTRIAXone (ROCEPHIN)  IV Stopped (04/15/21 2213)   furosemide 62 mL/hr at 04/16/21 1000   PRN Meds: sodium chloride, diclofenac Sodium  Allergies:    Allergies  Allergen Reactions   Vancomycin Hives    Social History:   Social History   Socioeconomic History   Marital status: Legally Separated    Spouse name: Not on file   Number of children: Not on file   Years of education: Not on file   Highest education level: Not on file  Occupational History   Not on file  Tobacco Use   Smoking status: Every Day    Types: Cigarettes   Smokeless tobacco: Not on file  Substance and Sexual Activity  Alcohol use: Not on file    Comment: rarely   Drug use: No   Sexual activity: Not on file  Other Topics Concern   Not on file  Social History Narrative   Not on file   Social Determinants of Health   Financial Resource Strain: Not on file  Food Insecurity: Not on file  Transportation Needs: Not on file  Physical Activity: Not on file  Stress: Not on file  Social Connections: Not on file  Intimate Partner Violence: Not on file    Family History:   Family History  Problem Relation Age of Onset   Hypertension Mother    Hypertension Father    Heart attack Maternal Grandfather      ROS:  Please see the history of present illness.  All other ROS reviewed and negative.      Physical Exam/Data:   Vitals:   04/16/21 0735 04/16/21 1200 04/16/21 1210 04/16/21 1220  BP: (!) 145/109   (!) 139/105  Pulse: 81 86 87 92  Resp: 15   20  Temp: 97.8 F (36.6 C)   98.1 F (36.7 C)  TempSrc: Oral   Oral  SpO2: 97%   100%  Weight:      Height:        Intake/Output Summary (Last 24 hours) at 04/16/2021 1345 Last data filed at 04/16/2021 1000 Gross per 24 hour  Intake 839.87 ml  Output 1000 ml  Net -160.13 ml   Last 3 Weights 04/16/2021 04/15/2021 04/15/2021  Weight (lbs) 165 lb 5.5 oz 160 lb 4.8 oz 156 lb 15.5 oz  Weight (kg) 75 kg 72.712 kg 71.2 kg     Body mass index is 24.42 kg/m.  General: Thin frail elderly appearing female than stated age 13: normal Neck: + JVD Vascular: No carotid bruits; Distal pulses 2+ bilaterally Cardiac:  normal S1, S2; RRR; no murmur Lungs: Diminished breath sounds with rales  abd: soft, nontender, no hepatomegaly  Ext: 1+ edema Musculoskeletal:  No deformities, BUE and BLE strength normal and equal Skin: warm and dry  Neuro:  CNs 2-12 intact, no focal abnormalities noted Psych:  Normal affect   EKG:  The EKG was personally reviewed and demonstrates: Sinus tachycardia, repolarization abnormality with LVH Telemetry:  Telemetry was personally reviewed and demonstrates: Sinus rhythm with PVC  Relevant CV Studies:  CT of chest/abdomen/Pelvix 02/26/21 Impression  1.Postsurgical changes from tricuspid valve repair with loculated fluid along the right heart border which may represent a loculated pericardial effusion versus loculated pleural effusion.  2.Redemonstration of numerous septic emboli in the bilateral lungs with improving groundglass and intralobular septal thickening. There is however mild interval worsening of small right pleural effusion and right basilar consolidation with trace hydropneumothorax on the right.  3.Marked hepatosplenomegaly.  4.Persistent small amount of ascites in the pelvis.  Laboratory  Data:  High Sensitivity Troponin:   Recent Labs  Lab 04/15/21 1027 04/15/21 1228  TROPONINIHS 34* 31*     Chemistry Recent Labs  Lab 04/15/21 1027 04/15/21 1031 04/15/21 1800 04/16/21 0217 04/16/21 0718  NA 136   < > 136 136 135  K 3.7   < > 3.5 3.5 3.8  CL 107   < > 107 106 106  CO2 19*  --  19* 21* 22  GLUCOSE 107*   < > 104* 112* 86  BUN 11   < > 11 14 14   CREATININE 2.11*   < > 2.08* 2.23* 2.21*  CALCIUM 8.0*  --  7.9* 7.6* 7.6*  MG 1.4*  --   --   --   --   GFRNONAA 31*  --  31* 29* 29*  ANIONGAP 10  --  10 9 7    < > = values in this interval not displayed.    Recent Labs  Lab 04/15/21 1027 04/16/21 0217  PROT 7.0 5.5*  ALBUMIN 2.5* 1.9*  AST 40 31  ALT 31 25  ALKPHOS 89 74  BILITOT 0.8 0.7    Hematology Recent Labs  Lab 04/15/21 1442 04/15/21 1800 04/16/21 0217  WBC 7.3 6.7 5.6  RBC 2.71* 2.85* 2.67*  HGB 8.2* 8.4* 8.0*  HCT 25.4* 27.1* 25.2*  MCV 93.7 95.1 94.4  MCH 30.3 29.5 30.0  MCHC 32.3 31.0 31.7  RDW 19.0* 19.4* 19.2*  PLT 167 167 144*    BNP Recent Labs  Lab 04/15/21 1027  BNP >4,500.0*     Radiology/Studies:  DG Chest 2 View  Result Date: 04/15/2021 CLINICAL DATA:  resp distress since last night. Pt with no hx of resp issues. Pt with open heart surgery last month with swelling to abdomen and bil lower extremities. EXAM: CHEST - 2 VIEW.  Patient is rotated. COMPARISON:  Chest x-ray 04/15/2021 FINDINGS: Enlarged cardiac silhouette. The heart and mediastinal contours are unchanged. Persistent right lower to mid lung zone patchy airspace opacity. Slightly increased airspace opacity within the retrocardiac region. No pulmonary edema. Persistent trace to small volume right pleural effusion. No left pleural effusion. No pneumothorax. No acute osseous abnormality. IMPRESSION: 1. Slightly increased airspace opacity within the retrocardiac region. Followup PA and lateral chest X-ray is recommended in 3-4 weeks following therapy to ensure  resolution. 2. Persistent right lower to mid lung zone patchy airspace opacity. 3. Persistent trace to small volume right pleural effusion. 4. Persistent enlarged cardiac silhouette. Electronically Signed   By: Iven Finn M.D.   On: 04/15/2021 16:38   NM Pulmonary Perfusion  Result Date: 04/16/2021 CLINICAL DATA:  Shortness of breath EXAM: NUCLEAR MEDICINE PERFUSION LUNG SCAN TECHNIQUE: Perfusion images were obtained in multiple projections after intravenous injection of radiopharmaceutical. Ventilation scans intentionally deferred if perfusion scan and chest x-ray adequate for interpretation during COVID 19 epidemic. RADIOPHARMACEUTICALS:  4.4 mCi Tc-70m MAA IV COMPARISON:  Chest radiograph done on 04/15/2021 FINDINGS: There are patchy moderate to large sized areas of decreased perfusion in the posterior aspects of both lungs, more so on the left side. There are infiltrates in both lower lung fields, more so on the right side in the chest radiograph. Study is indeterminate without ventilation images. IMPRESSION: There are moderate to large sized areas of decreased perfusion in the posterior mid and lower lung fields, more so on the left side. Study is inconclusive to evaluate for pulmonary embolism. If there is continued high degree of clinical suspicion for pulmonary embolism, please consider CT pulmonary angiogram and/or bilateral lower extremity venous Doppler. Electronically Signed   By: Elmer Picker M.D.   On: 04/16/2021 12:27   US RENAL  Result Date: 04/15/2021 CLINICAL DATA:  Hematuria. EXAM: RENAL / URINARY TRACT ULTRASOUND COMPLETE COMPARISON:  CT 01/22/2021 FINDINGS: Right Kidney: Renal measurements: 11.5 x 5.0 x 5.0 cm = volume: 149 mL. Diffusely increased renal parenchymal echogenicity. Fullness of the renal pelvis without calyceal dilatation. No renal calculi. No focal lesion. Left Kidney: Renal measurements: 15.5 x 5.9 x 6 cm = volume: 285 mL. Diffusely increased renal  parenchymal echogenicity. No focal lesion or stone. No hydronephrosis. Bladder: Appears normal for  degree of bladder distention. Other: Small volume pelvic ascites. There are bilateral pleural effusions, with some complexity/loculations on the right. IMPRESSION: 1. Diffusely increased bilateral renal parenchymal echogenicity suggestive of chronic medical renal disease. 2. Slight fullness of the right renal pelvis without calyceal dilatation. 3. Left kidney is mildly enlarged, but similar to CT 2 months ago. 4. No renal calculi or focal renal lesion. 5. Bilateral pleural effusions, with mild complexity/loculations on the right. Small volume pelvic ascites. Electronically Signed   By: [name] Rake M.D.   On: 04/15/2021 22:48   DG Chest Port 1 View  Result Date: 04/15/2021 CLINICAL DATA:  Shortness of breath. History of recent open heart surgery. EXAM: PORTABLE CHEST 1 VIEW COMPARISON:  None. FINDINGS: Cardiomegaly and median sternotomy wires noted. Pulmonary vascular congestion with interstitial pulmonary edema noted. A small RIGHT pleural effusion with RIGHT LOWER lung consolidation/atelectasis noted. There is no evidence of pneumothorax. No acute bony abnormalities are present. IMPRESSION: 1. Cardiomegaly with interstitial pulmonary edema. 2. RIGHT LOWER lung consolidation/atelectasis and small RIGHT pleural effusion. Electronically Signed   By: Margarette Canada M.D.   On: 04/15/2021 11:00   ECHOCARDIOGRAM COMPLETE  Result Date: 04/16/2021    ECHOCARDIOGRAM REPORT   Patient Name:   Robyn Shaffer Date of Exam: 04/16/2021 Medical Rec #:  [id number]      Height:       69.0 in Accession #:    [id number]     Weight:       165.3 lb Date of Birth:  01-03-1986      BSA:          1.906 m Patient Age:    34 years       BP:           140/101 mmHg Patient Gender: F              HR:           91 bpm. Exam Location:  Inpatient Procedure: 2D Echo, Cardiac Doppler and Color Doppler Indications:    R06.9 DOE  History:         Patient has no prior history of Echocardiogram examinations.                 CHF, Endocarditis; Signs/Symptoms:Dyspnea.  Sonographer:    Glo Herring Referring Phys: [id number] [place]  1. Left ventricular ejection fraction, by estimation, is 45 to 50%. The left ventricle has mildly decreased function. The left ventricle demonstrates global hypokinesis. There is mild concentric left ventricular hypertrophy. Left ventricular diastolic function could not be evaluated.  2. Right ventricular systolic function is normal. The right ventricular size is normal. Tricuspid regurgitation signal is inadequate for assessing PA pressure.  3. The pericardial effusion is anterior to the right ventricle.  4. The mitral valve is normal in structure. Trivial mitral valve regurgitation. No evidence of mitral stenosis.  5. The tricuspid valve is has been repaired/replaced.  6. The aortic valve is tricuspid. Aortic valve regurgitation is trivial. Mild aortic valve sclerosis is present, with no evidence of aortic valve stenosis. Aortic valve mean gradient measures 3.0 mmHg. Aortic valve Vmax measures 1.16 m/s.  7. The inferior vena cava is dilated in size with <50% respiratory variability, suggesting right atrial pressure of 15 mmHg.  8. No prior images to compare side by side. Compared to report of Aug 2022, LVF appears to have declined. FINDINGS  Left Ventricle: Left ventricular ejection fraction, by estimation, is 45 to 50%. The left  ventricle has mildly decreased function. The left ventricle demonstrates global hypokinesis. The left ventricular internal cavity size was normal in size. There is  mild concentric left ventricular hypertrophy. Left ventricular diastolic function could not be evaluated. Right Ventricle: The right ventricular size is normal. No increase in right ventricular wall thickness. Right ventricular systolic function is normal. Tricuspid regurgitation signal is inadequate for assessing PA pressure.  Left Atrium: Left atrial size was normal in size. Right Atrium: Right atrial size was normal in size. Pericardium: Trivial pericardial effusion is present. The pericardial effusion is anterior to the right ventricle. Mitral Valve: The mitral valve is normal in structure. Trivial mitral valve regurgitation. No evidence of mitral valve stenosis. Tricuspid Valve: S/P 76mm bioprosthetic TVR (tricuspid valve replacement) 03/01/2021 with peak TVG 50mmHg. There is a mobile target near the TVR in the RV seen on the parasternal short axis view that likely represents residual TV chordae tendinae. The tricuspid valve is has been repaired/replaced. Tricuspid valve regurgitation is not demonstrated. Aortic Valve: The aortic valve is tricuspid. Aortic valve regurgitation is trivial. Mild aortic valve sclerosis is present, with no evidence of aortic valve stenosis. Aortic valve mean gradient measures 3.0 mmHg. Aortic valve peak gradient measures 5.4 mmHg. Pulmonic Valve: The pulmonic valve was normal in structure. Pulmonic valve regurgitation is trivial. No evidence of pulmonic stenosis. Aorta: The aortic root is normal in size and structure. Venous: The inferior vena cava is dilated in size with less than 50% respiratory variability, suggesting right atrial pressure of 15 mmHg. IAS/Shunts: No atrial level shunt detected by color flow Doppler.  LEFT VENTRICLE PLAX 2D LVIDd:         4.80 cm LVIDs:         4.00 cm LV PW:         1.30 cm LV IVS:        1.30 cm  RIGHT VENTRICLE          IVC RV Basal diam:  3.70 cm  IVC diam: 2.40 cm LEFT ATRIUM             Index        RIGHT ATRIUM           Index LA diam:        3.80 cm 1.99 cm/m   RA Area:     18.70 cm LA Vol (A2C):   56.1 ml 29.44 ml/m  RA Volume:   51.00 ml  26.76 ml/m LA Vol (A4C):   50.7 ml 26.61 ml/m LA Biplane Vol: 56.2 ml 29.49 ml/m  AORTIC VALVE                   PULMONIC VALVE AV Vmax:           116.00 cm/s PV Vmax:       0.80 m/s AV Vmean:          79.700 cm/s PV Peak  grad:  2.6 mmHg AV VTI:            0.210 m AV Peak Grad:      5.4 mmHg AV Mean Grad:      3.0 mmHg LVOT Vmax:         80.80 cm/s LVOT Vmean:        61.000 cm/s LVOT VTI:          0.137 m LVOT/AV VTI ratio: 0.65  AORTA Ao Root diam: 3.10 cm Ao Asc diam:  3.10 cm TRICUSPID VALVE TV Peak grad:  4.9 mmHg TV Mean grad:   3.2 mmHg TV Vmax:        1.11 m/s TV Vmean:       84.2 cm/s TV VTI:         0.29 msec  SHUNTS Systemic VTI: 0.14 m Fransico Him MD Electronically signed by Fransico Him MD Signature Date/Time: 04/16/2021/11:28:22 AM    Final    VAS [place] LOWER EXTREMITY VENOUS (DVT)  Result Date: 04/15/2021  Lower Venous DVT Study Patient Name:  HAIZLEE HENTON  Date of Exam:   04/15/2021 Medical Rec #: [id number]       Accession #:    [id number] Date of Birth: [birth year]       Patient Gender: F Patient Age:   41 years Exam [facility] Procedure:      VAS [place] LOWER EXTREMITY VENOUS (DVT) Referring Phys: Godfrey Pick --------------------------------------------------------------------------------  Indications: Edema.  Comparison Study: No prior studies. Performing Technologist: Darlin Coco RDMS, RVT  Examination Guidelines: A complete evaluation includes B-mode imaging, spectral Doppler, color Doppler, and power Doppler as needed of all accessible portions of each vessel. Bilateral testing is considered an integral part of a complete examination. Limited examinations for reoccurring indications may be performed as noted. The reflux portion of the exam is performed with the patient in reverse Trendelenburg.  +---------+---------------+---------+-----------+----------+--------------+ RIGHT    CompressibilityPhasicitySpontaneityPropertiesThrombus Aging +---------+---------------+---------+-----------+----------+--------------+ CFV      Full           Yes      Yes                                 +---------+---------------+---------+-----------+----------+--------------+ SFJ      Full                                                         +---------+---------------+---------+-----------+----------+--------------+ FV Prox  Full                                                        +---------+---------------+---------+-----------+----------+--------------+ FV Mid   Full                                                        +---------+---------------+---------+-----------+----------+--------------+ FV DistalFull                                                        +---------+---------------+---------+-----------+----------+--------------+ PFV      Full                                                        +---------+---------------+---------+-----------+----------+--------------+  POP      Full           Yes      Yes                                 +---------+---------------+---------+-----------+----------+--------------+ PTV      Full                                                        +---------+---------------+---------+-----------+----------+--------------+ PERO     Full                                                        +---------+---------------+---------+-----------+----------+--------------+ Gastroc  Full                                                        +---------+---------------+---------+-----------+----------+--------------+   +---------+---------------+---------+-----------+----------+--------------+ LEFT     CompressibilityPhasicitySpontaneityPropertiesThrombus Aging +---------+---------------+---------+-----------+----------+--------------+ CFV      Full           Yes      Yes                                 +---------+---------------+---------+-----------+----------+--------------+ SFJ      Full                                                        +---------+---------------+---------+-----------+----------+--------------+ FV Prox  Full                                                         +---------+---------------+---------+-----------+----------+--------------+ FV Mid   Full                                                        +---------+---------------+---------+-----------+----------+--------------+ FV DistalFull                                                        +---------+---------------+---------+-----------+----------+--------------+ PFV      Full                                                        +---------+---------------+---------+-----------+----------+--------------+  POP      Full           Yes      Yes                                 +---------+---------------+---------+-----------+----------+--------------+ PTV      Full                                                        +---------+---------------+---------+-----------+----------+--------------+ PERO     Full                                                        +---------+---------------+---------+-----------+----------+--------------+ [place]  Full                                                        +---------+---------------+---------+-----------+----------+--------------+     Summary: RIGHT: - There is no evidence of deep vein thrombosis in the lower extremity.  - No cystic structure found in the popliteal fossa.  LEFT: - There is no evidence of deep vein thrombosis in the lower extremity.  - No cystic structure found in the popliteal fossa.  *See table(s) above for measurements and observations.    Preliminary      Assessment and Plan:   Acute systolic heart failure -BNP greater than 4500.  Chest x-ray with interstitial pulmonary edema.   - Echo this admission showed LVEF of 45-50% (was 62% by echo 9/12).  - She got IV lasix 80mg  x 1 and 40mg  x1. Net I & O only -160cc.  -Evidence of volume overload by exam.  Patient endorse eating high salt diet. -Agree with IV lasix 120mg  BID. Strict I & O and daily weight.   2. CKD IIIb - Unknown baseline  - Scr  2.11>>2.1>>2.08>>2.23>>2.21  - Pending nephrology evaluation  3.  Recent septic bilateral pulmonary emboli -She was treated with warfarin but unable to take for past few days as she ran out of it - INR 1.1 yesterday  -No evidence of DVT on lower extremity venous Doppler - VQ scan as below:There are moderate to large sized areas of decreased perfusion in the posterior mid and lower lung fields, more so on the left side. Study is inconclusive to evaluate for pulmonary embolism. If there is continued high degree of clinical suspicion for pulmonary embolism, please consider CT pulmonary angiogram and/or bilateral lower extremity venous Doppler.  4.  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  5. Anemia - most recent hemoglobin 8 - Per Primary   6. Elevated troponin -Flat trend -No chest pain -Demand in setting of acute hypoxic respiratory failure/CHF, CKD and anemia  Dr. Tamala Julian to see.   Risk Assessment/Risk Scores:   [place]  Heart Association (NYHA) Functional Class NYHA Class III   For questions or updates, please contact [place] Please consult [email or web address] for contact info under    Jarrett Soho, PA  04/16/2021 1:45 PM

## 2021-04-16 NOTE — Progress Notes (Signed)
PHARMACY - PHYSICIAN COMMUNICATION CRITICAL VALUE ALERT - BLOOD CULTURE IDENTIFICATION (BCID)  Robyn Shaffer is an 35 y.o. female who presented to [facility] on 04/15/2021 with a chief complaint of SOB.  Assessment:  Has hx of endocarditis s/p tricupsid valve replacement on 01/2021. 1/3 blood cultures growing GPC - BCID showing staph species.  Name of physician (or Provider) Contacted: [facility]  Current antibiotics: Doxycycline (received a dose of ceftriaxone)  Changes to prescribed antibiotics recommended:  Given 1/3 blood cx, likely contaminant - would continue doxycycline for suppressive therapy.   Results for orders placed or performed during the hospital encounter of 04/15/21  Blood Culture ID Panel (Reflexed) (Collected: 04/15/2021  5:58 PM)  Result Value Ref Range   Enterococcus faecalis NOT DETECTED NOT DETECTED   Enterococcus Faecium NOT DETECTED NOT DETECTED   Listeria monocytogenes NOT DETECTED NOT DETECTED   Staphylococcus species DETECTED (A) NOT DETECTED   Staphylococcus aureus (BCID) NOT DETECTED NOT DETECTED   Staphylococcus epidermidis NOT DETECTED NOT DETECTED   Staphylococcus lugdunensis NOT DETECTED NOT DETECTED   Streptococcus species NOT DETECTED NOT DETECTED   Streptococcus agalactiae NOT DETECTED NOT DETECTED   Streptococcus pneumoniae NOT DETECTED NOT DETECTED   Streptococcus pyogenes NOT DETECTED NOT DETECTED   A.calcoaceticus-baumannii NOT DETECTED NOT DETECTED   Bacteroides fragilis NOT DETECTED NOT DETECTED   Enterobacterales NOT DETECTED NOT DETECTED   Enterobacter cloacae complex NOT DETECTED NOT DETECTED   Escherichia coli NOT DETECTED NOT DETECTED   Klebsiella aerogenes NOT DETECTED NOT DETECTED   Klebsiella oxytoca NOT DETECTED NOT DETECTED   Klebsiella pneumoniae NOT DETECTED NOT DETECTED   Proteus species NOT DETECTED NOT DETECTED   Salmonella species NOT DETECTED NOT DETECTED   Serratia marcescens NOT DETECTED NOT DETECTED   Haemophilus  influenzae NOT DETECTED NOT DETECTED   Neisseria meningitidis NOT DETECTED NOT DETECTED   Pseudomonas aeruginosa NOT DETECTED NOT DETECTED   Stenotrophomonas maltophilia NOT DETECTED NOT DETECTED   Candida albicans NOT DETECTED NOT DETECTED   Candida auris NOT DETECTED NOT DETECTED   Candida glabrata NOT DETECTED NOT DETECTED   Candida krusei NOT DETECTED NOT DETECTED   Candida parapsilosis NOT DETECTED NOT DETECTED   Candida tropicalis NOT DETECTED NOT DETECTED   Cryptococcus neoformans/gattii NOT DETECTED NOT DETECTED   Antonietta Jewel, PharmD, BCCCP Clinical Pharmacist  Phone: [phone] 04/16/2021 8:29 PM  Please check AMION for all [facility] phone numbers After 10:00 PM, call [place] [phone]

## 2021-04-16 NOTE — Progress Notes (Signed)
PT Cancellation Note  Patient Details Name: Robyn Shaffer MRN: [id number] DOB: 1985-12-10   Cancelled Treatment:    Reason Eval/Treat Not Completed: (P) Patient at procedure or test/unavailable Pt is off floor, PT will follow back for Evaluation this afternoon as able.  Riley Hallum B. Migdalia Dk PT, DPT Acute Rehabilitation Services Pager [phone] Office [phone]    [place] 04/16/2021, 12:08 PM

## 2021-04-16 NOTE — Evaluation (Signed)
Occupational Therapy Evaluation Patient Details Name: Robyn Shaffer MRN: [id number] DOB: [birth year] Today's Date: 04/16/2021   History of Present Illness 35 yo female presenting to ED on 10/30 with shortness of breath. PMH including endocarditis, kidney infection, polysubstance abuse, and mitral valve replacement.   Clinical Impression   PTA, pt was living with her mother and was independent ADLs and light IADLs. Currently, pt  performing ADLs and functional mobility at Mod I Level with slight increased in time. Pt denies SOB and fatigue during ADLs. Discussing dc recommendation as well as benefits of walking in hallway with staff during admission; pt verbalized understanding. Recommend dc home once medically stable per physician. All acute OT needs met and will sign off. Thank you.      Recommendations for follow up therapy are one component of a multi-disciplinary discharge planning process, led by the attending physician.  Recommendations may be updated based on patient status, additional functional criteria and insurance authorization.   Follow Up Recommendations  No OT follow up    Assistance Recommended at Discharge Intermittent Supervision/Assistance  Functional Status Assessment  Patient has had a recent decline in their functional status and demonstrates the ability to make significant improvements in function in a reasonable and predictable amount of time.  Equipment Recommendations  None recommended by OT    Recommendations for Other Services       Precautions / Restrictions Precautions Precautions: None      Mobility Bed Mobility Overal bed mobility: Independent                  Transfers Overall transfer level: Modified independent                        Balance Overall balance assessment: No apparent balance deficits (not formally assessed)                                         ADL either performed or assessed with  clinical judgement   ADL Overall ADL's : Modified independent                                       General ADL Comments: Slight increased in time as needed. Over all near baseline function     Vision Baseline Vision/History: 0 No visual deficits       Perception     Praxis      Pertinent Vitals/Pain Pain Assessment: No/denies pain     Hand Dominance Right   Extremity/Trunk Assessment Upper Extremity Assessment Upper Extremity Assessment: Overall WFL for tasks assessed   Lower Extremity Assessment Lower Extremity Assessment: Defer to PT evaluation   Cervical / Trunk Assessment Cervical / Trunk Assessment: Normal   Communication Communication Communication: No difficulties   Cognition Arousal/Alertness: Awake/alert Behavior During Therapy: WFL for tasks assessed/performed Overall Cognitive Status: Within Functional Limits for tasks assessed                                 General Comments: Able to answer st memory questions, naming three animals that start with C, and money management question without difficulty     General Comments  HR 100s during activity. SpO2 93% on RA  Exercises     Shoulder Instructions      Home Living Family/patient expects to be discharged to:: Private residence Living Arrangements: Parent Available Help at Discharge: Family;Available 24 hours/day Type of Home: House Home Access: Level entry     Home Layout: One level     Bathroom Shower/Tub: Tub/shower unit;Curtain   Bathroom Toilet: Standard     Home Equipment: None          Prior Functioning/Environment Prior Level of Function : Independent/Modified Independent               ADLs Comments: Does ADLs and IADLs. Not working or driving. Mother assists as well        OT Problem List: Decreased activity tolerance;Decreased knowledge of precautions;Decreased knowledge of use of DME or AE      OT Treatment/Interventions:       OT Goals(Current goals can be found in the care plan section) Acute Rehab OT Goals Patient Stated Goal: Get better and go home OT Goal Formulation: All assessment and education complete, DC therapy  OT Frequency:     Barriers to D/C:            Co-evaluation              AM-PAC OT "6 Clicks" Daily Activity     Outcome Measure Help from another person eating meals?: None Help from another person taking care of personal grooming?: None Help from another person toileting, which includes using toliet, bedpan, or urinal?: None Help from another person bathing (including washing, rinsing, drying)?: None Help from another person to put on and taking off regular upper body clothing?: None Help from another person to put on and taking off regular lower body clothing?: None 6 Click Score: 24   End of Session Nurse Communication: Mobility status  Activity Tolerance: Patient tolerated treatment well Patient left: in chair;with call bell/phone within reach;with chair alarm set  OT Visit Diagnosis: Other abnormalities of gait and mobility (R26.89);Muscle weakness (generalized) (M62.81)                Time: 0802-0821 OT Time Calculation (min): 19 min Charges:  OT General Charges $OT Visit: 1 Visit OT Evaluation $OT Eval Low Complexity: [place], OTR/L Acute Rehab Pager: [phone] Office: [place] 04/16/2021, 8:26 AM

## 2021-04-16 NOTE — Progress Notes (Signed)
Pt's  Red  MEWS was driven by her HR and respiration. Pt was anxious and restless. I had notified the MD earlier  about Pt's  request for stronger pain medication. MD ordered to continue the already ordered pain med (Tylenol    and will order  anxiolytics.

## 2021-04-16 NOTE — Progress Notes (Addendum)
FPTS Interim Progress Note  S:Pt states she was taking Suboxone previously. None in the past month. UDS positive for amphetamines.   O: BP (!) 167/147   Pulse (!) 111   Temp 98 F (36.7 C) (Oral)   Resp (!) 35   Ht 5\' 9"  (1.753 m)   Wt 72.7 kg   SpO2 99%   BMI 23.67 kg/m    GEN: appears older than stated age, uncomfortable  RESP: scattered expiratory wheezing, rales at bases, 3L O2 via [place] CVS: tachycardia, regular rhythm ABD: palpable induration at the belly button, no fluctuance, non-tender  MSK: no spinal tenderness C, T or L spine  GU: frank hematuria in canister  SKIN: warm, dry   A/P: Rounded with Dr. Owens Shark.  For Wheezing - Trial DuoNeb For pain - restart Suboxone 8-2 mg daily, Hold Miralax in setting of diarrhea   Lyndee Hensen, DO 04/16/2021, 1:37 AM PGY-3, [place] [facility] pager [phone]

## 2021-04-16 NOTE — Consult Note (Signed)
Chief Complaint: Patient was seen in consultation today for  Chief Complaint  Patient presents with   Shortness of Breath    Referring Physician(s): [name]   Supervising Physician: Jacqulynn Cadet  Patient Status: [facility] - In-pt  History of Present Illness: Robyn Shaffer is a 35 y.o. 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   She was admitted for acute hypoxemic respiratory failure in the setting of acute CHF exacerbation. Her creatinine was elevated on admission and a urinalysis was positive for proteinuria. Nephrology was consulted and they have asked Interventional Radiology to evaluate this patient for a non-focal renal biopsy for further work up.   Past Medical History:  Diagnosis Date   Endocarditis    s/p tricuspid valve replacement 01/2021   Kidney infection    Polysubstance abuse [facility])     Past Surgical History:  Procedure Laterality Date   ABDOMINAL HYSTERECTOMY     KIDNEY STONE SURGERY     MITRAL VALVE REPLACEMENT      Allergies: Vancomycin  Medications: Prior to Admission medications   Medication Sig Start Date End Date Taking? Authorizing Provider  B Complex-C-Folic Acid (VIRT-CAPS) 1 MG CAPS Take 1 mg by mouth daily. 03/03/21  Yes [provider]  famotidine (PEPCID) 20 MG tablet Take 20 mg by mouth daily. 03/03/21  Yes [provider]  furosemide (LASIX) 40 MG tablet Take 40 mg by mouth daily. 03/13/21  Yes [provider]  hydrOXYzine (VISTARIL) 50 MG capsule Take 50 mg by mouth 4 (four) times daily as needed for anxiety. 03/03/21  Yes [provider]  linezolid (ZYVOX) 600 MG tablet Take 600 mg by mouth daily.   Yes [provider]  melatonin 3 MG TABS tablet Take  6 mg by mouth at bedtime. 03/03/21  Yes [provider]  metoprolol tartrate (LOPRESSOR) 25 MG tablet Take 25 mg by mouth 2 (two) times daily. 03/03/21  Yes [provider]  naproxen (NAPROSYN) 500 MG tablet Take 500 mg by mouth 2 (two) times daily as needed for mild pain.   Yes [provider]  QUEtiapine (SEROQUEL) 50 MG tablet Take 50 mg by mouth at bedtime. 03/03/21  Yes [provider]  rifampin (RIFADIN) 300 MG capsule Take 300 mg by mouth daily.   Yes [provider]  sevelamer carbonate (RENVELA) 800 MG tablet Take 800 mg by mouth 3 (three) times daily with meals.   Yes [provider]  warfarin (COUMADIN) 2 MG tablet Take 3 mg by mouth daily. 03/15/21  Yes [provider]  doxycycline (VIBRA-TABS) 100 MG tablet Take 100 mg by mouth 2 (two) times daily. 03/28/21   [provider]     Family History  Problem Relation Age of Onset   Hypertension Mother    Hypertension Father    Heart attack Maternal Grandfather     Social History   Socioeconomic History   Marital status: Legally Separated    Spouse name: Not on file   Number of children: Not on file   Years of education: Not on file   Highest education level: Not on file  Occupational History   Not on file  Tobacco Use   Smoking status: Every Day    Types: Cigarettes   Smokeless tobacco: Not on file  Substance and  Sexual Activity   Alcohol use: Not on file    Comment: rarely   Drug use: No   Sexual activity: Not on file  Other Topics Concern   Not on file  Social History Narrative   Not on file   Social Determinants of Health   Financial Resource Strain: Not on file  Food Insecurity: Not on file  Transportation Needs: Not on file  Physical Activity: Not on file  Stress: Not on file  Social Connections: Not on file    Review of Systems: A 12 point ROS discussed and pertinent positives are indicated in the HPI above.  All other systems are  negative.  Review of Systems  Constitutional:  Positive for fatigue.  Respiratory:  Positive for shortness of breath.   Cardiovascular:  Positive for leg swelling.  Gastrointestinal:  Negative for abdominal pain, diarrhea, nausea and vomiting.  Neurological:  Positive for headaches.   Vital Signs: BP (!) 139/105 (BP Location: Left Arm)   Pulse 92   Temp 98.1 F (36.7 C) (Oral)   Resp 20   Ht 5\' 9"  (1.753 m)   Wt 165 lb 5.5 oz (75 kg)   SpO2 100%   BMI 24.42 kg/m   Physical Exam Constitutional:      General: She is not in acute distress.    Appearance: She is ill-appearing.  HENT:     Mouth/Throat:     Mouth: Mucous membranes are moist.     Pharynx: Oropharynx is clear.  Cardiovascular:     Rate and Rhythm: Normal rate and regular rhythm.  Pulmonary:     Effort: Pulmonary effort is normal.  Abdominal:     Tenderness: There is no abdominal tenderness.  Skin:    General: Skin is warm and dry.  Neurological:     Mental Status: She is alert and oriented to person, place, and time.    Imaging: DG Chest 2 View  Result Date: 04/15/2021 CLINICAL DATA:  resp distress since last night. Pt with no hx of resp issues. Pt with open heart surgery last month with swelling to abdomen and bil lower extremities. EXAM: CHEST - 2 VIEW.  Patient is rotated. COMPARISON:  Chest x-ray 04/15/2021 FINDINGS: Enlarged cardiac silhouette. The heart and mediastinal contours are unchanged. Persistent right lower to mid lung zone patchy airspace opacity. Slightly increased airspace opacity within the retrocardiac region. No pulmonary edema. Persistent trace to small volume right pleural effusion. No left pleural effusion. No pneumothorax. No acute osseous abnormality. IMPRESSION: 1. Slightly increased airspace opacity within the retrocardiac region. Followup PA and lateral chest X-ray is recommended in 3-4 weeks following therapy to ensure resolution. 2. Persistent right lower to mid lung zone patchy  airspace opacity. 3. Persistent trace to small volume right pleural effusion. 4. Persistent enlarged cardiac silhouette. Electronically Signed   By: Iven Finn M.D.   On: 04/15/2021 16:38   NM Pulmonary Perfusion  Result Date: 04/16/2021 CLINICAL DATA:  Shortness of breath EXAM: NUCLEAR MEDICINE PERFUSION LUNG SCAN TECHNIQUE: Perfusion images were obtained in multiple projections after intravenous injection of radiopharmaceutical. Ventilation scans intentionally deferred if perfusion scan and chest x-ray adequate for interpretation during COVID 19 epidemic. RADIOPHARMACEUTICALS:  4.4 mCi Tc-17m MAA IV COMPARISON:  Chest radiograph done on 04/15/2021 FINDINGS: There are patchy moderate to large sized areas of decreased perfusion in the posterior aspects of both lungs, more so on the left side. There are infiltrates in both lower lung fields, more so on the right side in  the chest radiograph. Study is indeterminate without ventilation images. IMPRESSION: There are moderate to large sized areas of decreased perfusion in the posterior mid and lower lung fields, more so on the left side. Study is inconclusive to evaluate for pulmonary embolism. If there is continued high degree of clinical suspicion for pulmonary embolism, please consider CT pulmonary angiogram and/or bilateral lower extremity venous Doppler. Electronically Signed   By: Elmer Picker M.D.   On: 04/16/2021 12:27   US RENAL  Result Date: 04/15/2021 CLINICAL DATA:  Hematuria. EXAM: RENAL / URINARY TRACT ULTRASOUND COMPLETE COMPARISON:  CT 01/22/2021 FINDINGS: Right Kidney: Renal measurements: 11.5 x 5.0 x 5.0 cm = volume: 149 mL. Diffusely increased renal parenchymal echogenicity. Fullness of the renal pelvis without calyceal dilatation. No renal calculi. No focal lesion. Left Kidney: Renal measurements: 15.5 x 5.9 x 6 cm = volume: 285 mL. Diffusely increased renal parenchymal echogenicity. No focal lesion or stone. No hydronephrosis.  Bladder: Appears normal for degree of bladder distention. Other: Small volume pelvic ascites. There are bilateral pleural effusions, with some complexity/loculations on the right. IMPRESSION: 1. Diffusely increased bilateral renal parenchymal echogenicity suggestive of chronic medical renal disease. 2. Slight fullness of the right renal pelvis without calyceal dilatation. 3. Left kidney is mildly enlarged, but similar to CT 2 months ago. 4. No renal calculi or focal renal lesion. 5. Bilateral pleural effusions, with mild complexity/loculations on the right. Small volume pelvic ascites. Electronically Signed   By: [name] Rake M.D.   On: 04/15/2021 22:48   DG Chest Port 1 View  Result Date: 04/15/2021 CLINICAL DATA:  Shortness of breath. History of recent open heart surgery. EXAM: PORTABLE CHEST 1 VIEW COMPARISON:  None. FINDINGS: Cardiomegaly and median sternotomy wires noted. Pulmonary vascular congestion with interstitial pulmonary edema noted. A small RIGHT pleural effusion with RIGHT LOWER lung consolidation/atelectasis noted. There is no evidence of pneumothorax. No acute bony abnormalities are present. IMPRESSION: 1. Cardiomegaly with interstitial pulmonary edema. 2. RIGHT LOWER lung consolidation/atelectasis and small RIGHT pleural effusion. Electronically Signed   By: Margarette Canada M.D.   On: 04/15/2021 11:00   ECHOCARDIOGRAM COMPLETE  Result Date: 04/16/2021    ECHOCARDIOGRAM REPORT   Patient Name:   Robyn Shaffer Date of Exam: 04/16/2021 Medical Rec #:  [id number]      Height:       69.0 in Accession #:    [id number]     Weight:       165.3 lb Date of Birth:  1985-06-26      BSA:          1.906 m Patient Age:    34 years       BP:           140/101 mmHg Patient Gender: F              HR:           91 bpm. Exam Location:  Inpatient Procedure: 2D Echo, Cardiac Doppler and Color Doppler Indications:    R06.9 DOE  History:        Patient has no prior history of Echocardiogram examinations.                  CHF, Endocarditis; Signs/Symptoms:Dyspnea.  Sonographer:    Glo Herring Referring Phys: [id number] [place]  1. Left ventricular ejection fraction, by estimation, is 45 to 50%. The left ventricle has mildly decreased function. The left ventricle demonstrates global hypokinesis. There is mild concentric left  ventricular hypertrophy. Left ventricular diastolic function could not be evaluated.  2. Right ventricular systolic function is normal. The right ventricular size is normal. Tricuspid regurgitation signal is inadequate for assessing PA pressure.  3. The pericardial effusion is anterior to the right ventricle.  4. The mitral valve is normal in structure. Trivial mitral valve regurgitation. No evidence of mitral stenosis.  5. The tricuspid valve is has been repaired/replaced.  6. The aortic valve is tricuspid. Aortic valve regurgitation is trivial. Mild aortic valve sclerosis is present, with no evidence of aortic valve stenosis. Aortic valve mean gradient measures 3.0 mmHg. Aortic valve Vmax measures 1.16 m/s.  7. The inferior vena cava is dilated in size with <50% respiratory variability, suggesting right atrial pressure of 15 mmHg.  8. No prior images to compare side by side. Compared to report of Aug 2022, LVF appears to have declined. FINDINGS  Left Ventricle: Left ventricular ejection fraction, by estimation, is 45 to 50%. The left ventricle has mildly decreased function. The left ventricle demonstrates global hypokinesis. The left ventricular internal cavity size was normal in size. There is  mild concentric left ventricular hypertrophy. Left ventricular diastolic function could not be evaluated. Right Ventricle: The right ventricular size is normal. No increase in right ventricular wall thickness. Right ventricular systolic function is normal. Tricuspid regurgitation signal is inadequate for assessing PA pressure. Left Atrium: Left atrial size was normal in size. Right Atrium: Right  atrial size was normal in size. Pericardium: Trivial pericardial effusion is present. The pericardial effusion is anterior to the right ventricle. Mitral Valve: The mitral valve is normal in structure. Trivial mitral valve regurgitation. No evidence of mitral valve stenosis. Tricuspid Valve: S/P 5mm bioprosthetic TVR (tricuspid valve replacement) 03/01/2021 with peak TVG 52mmHg. There is a mobile target near the TVR in the RV seen on the parasternal short axis view that likely represents residual TV chordae tendinae. The tricuspid valve is has been repaired/replaced. Tricuspid valve regurgitation is not demonstrated. Aortic Valve: The aortic valve is tricuspid. Aortic valve regurgitation is trivial. Mild aortic valve sclerosis is present, with no evidence of aortic valve stenosis. Aortic valve mean gradient measures 3.0 mmHg. Aortic valve peak gradient measures 5.4 mmHg. Pulmonic Valve: The pulmonic valve was normal in structure. Pulmonic valve regurgitation is trivial. No evidence of pulmonic stenosis. Aorta: The aortic root is normal in size and structure. Venous: The inferior vena cava is dilated in size with less than 50% respiratory variability, suggesting right atrial pressure of 15 mmHg. IAS/Shunts: No atrial level shunt detected by color flow Doppler.  LEFT VENTRICLE PLAX 2D LVIDd:         4.80 cm LVIDs:         4.00 cm LV PW:         1.30 cm LV IVS:        1.30 cm  RIGHT VENTRICLE          IVC RV Basal diam:  3.70 cm  IVC diam: 2.40 cm LEFT ATRIUM             Index        RIGHT ATRIUM           Index LA diam:        3.80 cm 1.99 cm/m   RA Area:     18.70 cm LA Vol (A2C):   56.1 ml 29.44 ml/m  RA Volume:   51.00 ml  26.76 ml/m LA Vol (A4C):   50.7 ml 26.61 ml/m LA  Biplane Vol: 56.2 ml 29.49 ml/m  AORTIC VALVE                   PULMONIC VALVE AV Vmax:           116.00 cm/s PV Vmax:       0.80 m/s AV Vmean:          79.700 cm/s PV Peak grad:  2.6 mmHg AV VTI:            0.210 m AV Peak Grad:      5.4  mmHg AV Mean Grad:      3.0 mmHg LVOT Vmax:         80.80 cm/s LVOT Vmean:        61.000 cm/s LVOT VTI:          0.137 m LVOT/AV VTI ratio: 0.65  AORTA Ao Root diam: 3.10 cm Ao Asc diam:  3.10 cm TRICUSPID VALVE TV Peak grad:   4.9 mmHg TV Mean grad:   3.2 mmHg TV Vmax:        1.11 m/s TV Vmean:       84.2 cm/s TV VTI:         0.29 msec  SHUNTS Systemic VTI: 0.14 m Fransico Him MD Electronically signed by Fransico Him MD Signature Date/Time: 04/16/2021/11:28:22 AM    Final    VAS [place] LOWER EXTREMITY VENOUS (DVT)  Result Date: 04/16/2021  Lower Venous DVT Study Patient Name:  Robyn Shaffer  Date of Exam:   04/15/2021 Medical Rec #: [id number]       Accession #:    [id number] Date of Birth: [birth year]       Patient Gender: F Patient Age:   37 years Exam Location:  [facility] Procedure:      VAS [place] LOWER EXTREMITY VENOUS (DVT) Referring Phys: Godfrey Pick --------------------------------------------------------------------------------  Indications: Edema.  Comparison Study: No prior studies. Performing Technologist: Darlin Coco RDMS, RVT  Examination Guidelines: A complete evaluation includes B-mode imaging, spectral Doppler, color Doppler, and power Doppler as needed of all accessible portions of each vessel. Bilateral testing is considered an integral part of a complete examination. Limited examinations for reoccurring indications may be performed as noted. The reflux portion of the exam is performed with the patient in reverse Trendelenburg.  +---------+---------------+---------+-----------+----------+--------------+ RIGHT    CompressibilityPhasicitySpontaneityPropertiesThrombus Aging +---------+---------------+---------+-----------+----------+--------------+ CFV      Full           Yes      Yes                                 +---------+---------------+---------+-----------+----------+--------------+ SFJ      Full                                                         +---------+---------------+---------+-----------+----------+--------------+ FV Prox  Full                                                        +---------+---------------+---------+-----------+----------+--------------+ FV Mid   Full                                                        +---------+---------------+---------+-----------+----------+--------------+  FV DistalFull                                                        +---------+---------------+---------+-----------+----------+--------------+ PFV      Full                                                        +---------+---------------+---------+-----------+----------+--------------+ POP      Full           Yes      Yes                                 +---------+---------------+---------+-----------+----------+--------------+ PTV      Full                                                        +---------+---------------+---------+-----------+----------+--------------+ PERO     Full                                                        +---------+---------------+---------+-----------+----------+--------------+ Gastroc  Full                                                        +---------+---------------+---------+-----------+----------+--------------+   +---------+---------------+---------+-----------+----------+--------------+ LEFT     CompressibilityPhasicitySpontaneityPropertiesThrombus Aging +---------+---------------+---------+-----------+----------+--------------+ CFV      Full           Yes      Yes                                 +---------+---------------+---------+-----------+----------+--------------+ SFJ      Full                                                        +---------+---------------+---------+-----------+----------+--------------+ FV Prox  Full                                                         +---------+---------------+---------+-----------+----------+--------------+ FV Mid   Full                                                        +---------+---------------+---------+-----------+----------+--------------+  FV DistalFull                                                        +---------+---------------+---------+-----------+----------+--------------+ PFV      Full                                                        +---------+---------------+---------+-----------+----------+--------------+ POP      Full           Yes      Yes                                 +---------+---------------+---------+-----------+----------+--------------+ PTV      Full                                                        +---------+---------------+---------+-----------+----------+--------------+ PERO     Full                                                        +---------+---------------+---------+-----------+----------+--------------+ Gastroc  Full                                                        +---------+---------------+---------+-----------+----------+--------------+     Summary: RIGHT: - There is no evidence of deep vein thrombosis in the lower extremity.  - No cystic structure found in the popliteal fossa.  LEFT: - There is no evidence of deep vein thrombosis in the lower extremity.  - No cystic structure found in the popliteal fossa.  *See table(s) above for measurements and observations. Electronically signed by Orlie Pollen on 04/16/2021 at 2:34:25 PM.    Final     Labs:  CBC: Recent Labs    04/15/21 1027 04/15/21 1031 04/15/21 1120 04/15/21 1442 04/15/21 1800 04/16/21 0217  WBC 6.7  --   --  7.3 6.7 5.6  HGB 9.0*   < > 7.8* 8.2* 8.4* 8.0*  HCT 29.1*   < > 23.0* 25.4* 27.1* 25.2*  PLT 184  --   --  167 167 144*   < > = values in this interval not displayed.    COAGS: Recent Labs    04/15/21 1027  INR 1.1    BMP: Recent Labs     04/15/21 1027 04/15/21 1031 04/15/21 1120 04/15/21 1800 04/16/21 0217 04/16/21 0718  NA 136 140 140 136 136 135  K 3.7 3.7 3.4* 3.5 3.5 3.8  CL 107 107  --  107 106 106  CO2 19*  --   --  19* 21* 22  GLUCOSE 107* 103*  --  104* 112* 86  BUN 11 12  --  11 14 14   CALCIUM 8.0*  --   --  7.9* 7.6* 7.6*  CREATININE 2.11* 2.10*  --  2.08* 2.23* 2.21*  GFRNONAA 31*  --   --  31* 29* 29*    LIVER FUNCTION TESTS: Recent Labs    01/22/21 1817 04/15/21 1027 04/16/21 0217  BILITOT 0.8 0.8 0.7  AST 59* 40 31  ALT 51* 31 25  ALKPHOS 83 89 74  PROT 8.0 7.0 5.5*  ALBUMIN 4.0 2.5* 1.9*    TUMOR MARKERS: No results for input(s): AFPTM, CEA, CA199, CHROMGRNA in the last 8760 hours.  Assessment and Plan:  Acute kidney injury; proteinuria: Robyn Shaffer, 36 year old female, is tentatively scheduled for 04/17/21 for an image-guided non-focal renal biopsy.  Risks and benefits of this procedure were discussed with the patient and/or patient's family including, but not limited to bleeding, infection, damage to adjacent structures or low yield requiring additional tests.  All of the questions were answered and there is agreement to proceed. She will be NPO at midnight. AM labs have been ordered.   Consent signed and in IR   Thank you for this interesting consult.  I greatly enjoyed meeting Robyn Shaffer and look forward to participating in their care.  A copy of this report was sent to the requesting provider on this date.  Electronically Signed: Soyla Dryer, AGACNP-BC [phone] 04/16/2021, 4:25 PM   I spent a total of 20 Minutes    in face to face in clinical consultation, greater than 50% of which was counseling/coordinating care for non-focal renal biopsy

## 2021-04-16 NOTE — Progress Notes (Addendum)
Family Medicine Teaching Service Daily Progress Note Intern Pager: [phone]  Patient name: Robyn Shaffer Medical record number: [id number] Date of birth: Oct 27, 1985 Age: 35 y.o. Gender: female  Primary Care Provider: Patient, No Pcp Per (Inactive) Consultants: None Code Status: Full  Pt Overview and Major Events to Date:  10/30 - admitted   Assessment and Plan:  Robyn Shaffer is a 35 year old female who was admitted for shortness of breath and frank hematuria.  PMH is significant for endocarditis s/p tricuspid valve replacement, anemia, IV drug use, and polysubstance abuse.  Possible CHF Hx of endocarditis s/p bioprosthetic tricuspid valve replacement Patient has no chest pain but reports having poor sleep from SOB related to PND that improved with sitting up. Patient believes her anxiety is contributory to her dyspnea. Overnight she has stable HR but remained hypertensive. She reports taking metoprolol at home but medication hasn't been started, pending med rec.  On exam she has diffused crackles, abdominal distension and BLE edema. Patient report having significant edema of her abdomen and BLE since her open heart surgery which is suspicious of cardiac etiology or systolic dysfunction. Will follow up with echo. Considering her history of vegetive valvular growth there is concern for possible septic pulmonary emboli, Will order a V/Q scan -Daily I's/O's -Follow-up Echo -Daily weight -Continue Doxycycline 100mg  Q12H -Continue ceftriaxone 2g  -Daily lab: BMP twice daily, CBC -Continue cardiorespiratory monitoring -Cardiology consulted -Follow up V/Q scan   AKI  nephrotic kidney disease Creatinine this morning was 2.23 and trending up. Most likely related to her Lasix treatment and on going kidney disease.  Urine analysis was positive for protein urea and frank hematuria. Renal ultrasound normal diffuse pelvic ascites and bilateral renal parenchyma echogenicity is reflective of  medical renal disease.  On exam patient has no CVA tenderness or pelvic tenderness. Positive dysuria inform of severe bilateral frank pain after every void. dysuria.  Need further work-up  to better understand her disease process. Will consult Nephrology for their recommendations -Nephrology consulted -Avoid nephrotoxic agent -Renal function panel   Anemia Hemoglobin this morning 8.0 slight decrease from her hemoglobin on admission. She has hx of chronic anemia -Transfusion threshold < 7 -Daily lab: CBC  Polysubstance abuse UDS on admission was positive for amphetamine.  Patient reports being clean for the past 2 months.  He has history of alcohol abuse, her ethanol level <10.  -Monitor COWS protocol -Nicotine patch - continue seboxone   FEN/GI: Heart Healthy diet PPx: Holding prophylactic due to increased risk of bleeding. Dispo:Home pending clinical improvement .   Subjective:  Ms. Robyn Shaffer said she didn't have good sleep lastnight. First was the bilateral frank pain which improved after suboxone. However she had poor sleep due to SOB that was worse with laying down and only improved with sitting up position.   Objective: Temp:  [97.7 F (36.5 C)-98.4 F (36.9 C)] 97.8 F (36.6 C) (10/31 0735) Pulse Rate:  [81-115] 81 (10/31 0735) Resp:  [10-39] 15 (10/31 0735) BP: (132-183)/(91-147) 145/109 (10/31 0735) SpO2:  [94 %-100 %] 97 % (10/31 0735) Weight:  [71.2 kg-75 kg] 75 kg (10/31 0535) Physical Exam: General: Awake, appears older than stated age, NAD Cardiovascular: RRR, no murmurs, normal S1/S2 Respiratory: Diffused crackles, with intermittent wheezing. Normal WOB breathing Abdomen: soft, improved abdominal distension, area od hard flat nodules above the umbilicals. Extremities: +1 BLE edema, +2 pedal and radial pulse  Laboratory: Recent Labs  Lab 04/15/21 1442 04/15/21 1800 04/16/21 0217  WBC 7.3 6.7  5.6  HGB 8.2* 8.4* 8.0*  HCT 25.4* 27.1* 25.2*  PLT 167 167  144*   Recent Labs  Lab 04/15/21 1027 04/15/21 1031 04/15/21 1120 04/15/21 1800 04/16/21 0217  NA 136 140 140 136 136  K 3.7 3.7 3.4* 3.5 3.5  CL 107 107  --  107 106  CO2 19*  --   --  19* 21*  BUN 11 12  --  11 14  CREATININE 2.11* 2.10*  --  2.08* 2.23*  CALCIUM 8.0*  --   --  7.9* 7.6*  PROT 7.0  --   --   --  5.5*  BILITOT 0.8  --   --   --  0.7  ALKPHOS 89  --   --   --  74  ALT 31  --   --   --  25  AST 40  --   --   --  31  GLUCOSE 107* 103*  --  104* 112*     Imaging/Diagnostic Tests: No new imaging  Alen Bleacher, MD 04/16/2021, 7:54 AM PGY-1, [place] Intern pager: [phone], text pages welcome

## 2021-04-17 ENCOUNTER — Inpatient Hospital Stay (HOSPITAL_COMMUNITY): Payer: Medicaid - Out of State

## 2021-04-17 ENCOUNTER — Encounter (HOSPITAL_COMMUNITY): Payer: Self-pay | Admitting: Student

## 2021-04-17 DIAGNOSIS — J810 Acute pulmonary edema: Secondary | ICD-10-CM | POA: Diagnosis not present

## 2021-04-17 DIAGNOSIS — I5021 Acute systolic (congestive) heart failure: Secondary | ICD-10-CM | POA: Diagnosis not present

## 2021-04-17 DIAGNOSIS — R319 Hematuria, unspecified: Secondary | ICD-10-CM | POA: Diagnosis not present

## 2021-04-17 DIAGNOSIS — R0603 Acute respiratory distress: Secondary | ICD-10-CM | POA: Diagnosis not present

## 2021-04-17 DIAGNOSIS — R0600 Dyspnea, unspecified: Secondary | ICD-10-CM | POA: Diagnosis not present

## 2021-04-17 LAB — BASIC METABOLIC PANEL
Anion gap: 11 (ref 5–15)
Anion gap: 8 (ref 5–15)
BUN: 18 mg/dL (ref 6–20)
BUN: 20 mg/dL (ref 6–20)
CO2: 19 mmol/L — ABNORMAL LOW (ref 22–32)
CO2: 23 mmol/L (ref 22–32)
Calcium: 7.7 mg/dL — ABNORMAL LOW (ref 8.9–10.3)
Calcium: 8.2 mg/dL — ABNORMAL LOW (ref 8.9–10.3)
Chloride: 103 mmol/L (ref 98–111)
Chloride: 105 mmol/L (ref 98–111)
Creatinine, Ser: 2.41 mg/dL — ABNORMAL HIGH (ref 0.44–1.00)
Creatinine, Ser: 2.42 mg/dL — ABNORMAL HIGH (ref 0.44–1.00)
GFR, Estimated: 26 mL/min — ABNORMAL LOW (ref >60)
GFR, Estimated: 26 mL/min — ABNORMAL LOW (ref >60)
Glucose, Bld: 72 mg/dL (ref 70–99)
Glucose, Bld: 78 mg/dL (ref 70–99)
Potassium: 3.9 mmol/L (ref 3.5–5.1)
Potassium: 4.7 mmol/L (ref 3.5–5.1)
Sodium: 133 mmol/L — ABNORMAL LOW (ref 135–145)
Sodium: 136 mmol/L (ref 135–145)

## 2021-04-17 LAB — ENA+DNA/DS+ANTICH+CENTRO+JO...
Anti JO-1: <0.2 AI (ref 0.0–0.9)
Centromere Ab Screen: <0.2 AI (ref 0.0–0.9)
Chromatin Ab SerPl-aCnc: <0.2 AI (ref 0.0–0.9)
ENA SM Ab Ser-aCnc: <0.2 AI (ref 0.0–0.9)
Ribonucleic Protein: 0.4 AI (ref 0.0–0.9)
SSA (Ro) (ENA) Antibody, IgG: <0.2 AI (ref 0.0–0.9)
SSB (La) (ENA) Antibody, IgG: <0.2 AI (ref 0.0–0.9)
Scleroderma (Scl-70) (ENA) Antibody, IgG: <0.2 AI (ref 0.0–0.9)
ds DNA Ab: 19 IU/mL — ABNORMAL HIGH (ref 0–9)

## 2021-04-17 LAB — CBC
HCT: 27.7 % — ABNORMAL LOW (ref 36.0–46.0)
Hemoglobin: 8.7 g/dL — ABNORMAL LOW (ref 12.0–15.0)
MCH: 29.6 pg (ref 26.0–34.0)
MCHC: 31.4 g/dL (ref 30.0–36.0)
MCV: 94.2 fL (ref 80.0–100.0)
Platelets: 172 10*3/uL (ref 150–400)
RBC: 2.94 MIL/uL — ABNORMAL LOW (ref 3.87–5.11)
RDW: 19.7 % — ABNORMAL HIGH (ref 11.5–15.5)
WBC: 4.8 10*3/uL (ref 4.0–10.5)
nRBC: 0 % (ref 0.0–0.2)

## 2021-04-17 LAB — CULTURE, BLOOD (ROUTINE X 2)

## 2021-04-17 LAB — ANCA PROFILE
Anti-MPO Antibodies: <0.2 units (ref 0.0–0.9)
Anti-PR3 Antibodies: <0.2 units (ref 0.0–0.9)
Atypical P-ANCA titer: <1:20 {titer}
C-ANCA: <1:20 {titer}
P-ANCA: <1:20 {titer}

## 2021-04-17 LAB — RPR: RPR Ser Ql: NONREACTIVE

## 2021-04-17 LAB — C3 COMPLEMENT: C3 Complement: 138 mg/dL (ref 82–167)

## 2021-04-17 LAB — SAVE SMEAR(SSMR), FOR PROVIDER SLIDE REVIEW

## 2021-04-17 LAB — PROTIME-INR
INR: 1.2 (ref 0.8–1.2)
Prothrombin Time: 14.9 seconds (ref 11.4–15.2)

## 2021-04-17 LAB — GLOMERULAR BASEMENT MEMBRANE ANTIBODIES: GBM Ab: <0.2 units (ref 0.0–0.9)

## 2021-04-17 LAB — KAPPA/LAMBDA LIGHT CHAINS
Kappa free light chain: 127.3 mg/L — ABNORMAL HIGH (ref 3.3–19.4)
Kappa, lambda light chain ratio: 0.91 (ref 0.26–1.65)
Lambda free light chains: 140 mg/L — ABNORMAL HIGH (ref 5.7–26.3)

## 2021-04-17 LAB — C4 COMPLEMENT: Complement C4, Body Fluid: 17 mg/dL (ref 12–38)

## 2021-04-17 LAB — ANA W/REFLEX IF POSITIVE: Anti Nuclear Antibody (ANA): POSITIVE — AB

## 2021-04-17 MED ORDER — CARVEDILOL 6.25 MG PO TABS
6.2500 mg | ORAL_TABLET | Freq: Two times a day (BID) | ORAL | Status: DC
  Administered 2021-04-17 (×2): 6.25 mg via ORAL
  Filled 2021-04-17 (×2): qty 1

## 2021-04-17 MED ORDER — HYDRALAZINE HCL 20 MG/ML IJ SOLN: 10.0000 mg | Freq: Once | INTRAMUSCULAR | Status: AC

## 2021-04-17 MED ORDER — LIDOCAINE HCL (PF) 1 % IJ SOLN
INTRAMUSCULAR | Status: AC
  Filled 2021-04-17: qty 30

## 2021-04-17 MED ORDER — FAMOTIDINE 20 MG PO TABS
20.0000 mg | ORAL_TABLET | Freq: Every day | ORAL | Status: DC
  Administered 2021-04-17 – 2021-04-27 (×12): 20 mg via ORAL
  Filled 2021-04-17 (×12): qty 1

## 2021-04-17 MED ORDER — HYDRALAZINE HCL 20 MG/ML IJ SOLN
INTRAMUSCULAR | Status: AC
  Administered 2021-04-17: 10 mg via INTRAVENOUS
  Filled 2021-04-17: qty 1

## 2021-04-17 MED ORDER — GELATIN ABSORBABLE 12-7 MM EX MISC
CUTANEOUS | Status: AC
  Filled 2021-04-17: qty 1

## 2021-04-17 NOTE — Progress Notes (Addendum)
Patient is off the floor for studies, question kidney biopsy. Note that 1 of 3 blood culture bottles were positive for staph aureus confirming that she continues to have bacteremia. In looking normal vital signs, she did have some diuresis, some clinical improvement in orthopnea, but a bump in creatinine was noted. Will come back around later today or AM. Continue to closely follow kidney function.Marland Kitchen

## 2021-04-17 NOTE — Progress Notes (Signed)
FPTS Brief Progress Note  S: Patient sleeping    O: BP (!) 157/109 (BP Location: Right Arm)   Pulse (!) 103   Temp 98.9 F (37.2 C) (Oral)   Resp 16   Ht 5\' 9"  (1.753 m)   Wt 75 kg   SpO2 95%   BMI 24.42 kg/m    GEN: snoring lightly  RESP: equal chest rise and fall CVS: tachycardic, normal rhythm on monitor   A/P: Called by pharmacy tonight that pt has staph species growing in 1 of 3 blood cultures. No change to current to antibiotics.  - Contact precautions  - Orders reviewed. Labs for AM ordered, which was adjusted as needed.    Lyndee Hensen, DO 04/17/2021, 12:28 AM PGY-3, [place] [facility] Night Resident  Please page [phone] with questions.

## 2021-04-17 NOTE — Progress Notes (Signed)
Family Medicine Teaching Service Daily Progress Note Intern Pager: [phone]  Patient name: Robyn Shaffer Medical record number: [id number] Date of birth: 12-May-1986 Age: 35 y.o. Gender: female  Primary Care Provider: Patient, No Pcp Per (Inactive) Consultants: Nephrology, Cardiology Code Status: Full  Pt Overview and Major Events to Date:  10/30 - admitted  Assessment and Plan:  Robyn Shaffer is a 35 year old female who was admitted for shortness of breath and frank hematuria.  PMH is significant for endocarditis s/p tricuspid valve replacement, anemia, IV drug use, and polysubstance abuse.  Acute CHF exacerbation Hx of endocarditis s/p bioprosthetic tricuspid valve replacement 02/13/21. Patient hypertensive to 150/99 with tachycardia to 107 today. Patient EF 45-50% where it was 62% on 02/26/21. Patient also admitted with BNP of 4,500, all consistent with CHF exacerbation. V/Q scan showed moderate to large sized areas of decreaed perfusion in posterior mid/lower lung fields. Study was inconclusive to rule out PE.  -Appreciate cardiology recs -Continue doxycycline 100 mg every 12 hours for 6 months (10/14) -BMP BID -Daily CBC -Cardiorespiratory monitoring -Follow up cardiology consult -V/Q Scan: Study is inconclusive to evaluate for pulmonary embolism.  -Echo: LV 45-50%, was 62% 9/12 -I/O: I:782.9 O: 1650, Net -1.034L -weight: 73.1 kg, (75) -120 lasix BID -Hold warfarin,  f/u IR when to restart.  -BNP > 4500 on 04/15/21 -Bcx contaminant, 1 vial with staph hominis species   CKD IIIb  AKI  nephrotic kidney disease  Hematuria IR biopsy not performed due to diastolic HTN, Diastolic pressures must be < 80. Rise in Cr today to 2.41 from 2.28, Hepatitis serologies were reactive for HCV, HCV RNA quant test to follow up. Patient had UOP of 1,650 mL yesterday, with a UA positive for 100 protein and moderate hgb. U microscopy demonstrated rare bacteria, with 50 RBC. Protein/CR ratio 5.38,  concerning for nephrotic syndrome. Patient continues to have hematuria, lighter than its been. -Appreciate nephrology recs -Start Coreg 6.25 BID for HTN, consider clonidine in am in BP still high -Continue lasix 120 mg BID -Daily I/O's -Avoid nephrotoxic medications -Continue to trend UOP -Continue to trend Cr / GFR  -F/u on HCV RNA quant -F/u Cryoglobulin, Rheumatoid factor, Hep C genotype   Anemia Transfusion threshold less than 7. Still holding warfarin per hematuria and kidney biposy. Will follow up with IR to determine when to restart anticoagulation. -Hgb 8.7 today, (8.0, 8.4) -CBC daily -PT - normal -INR - Normal   Polysubstance abuse UDS on admission positive for amphetamine.  Patient reports being clean for past 2 months.  She has a history of alcohol abuse, ethanol < 10 on admission. COWS 0 today. -Nicotine patch -Continue Suboxone, 8-2 mg  FEN/GI: Heart Healthy diet PPx: Holding prophylactic due to increased risk of bleeding Dispo: Home pending clinical improvement    Subjective:  Patient is complaining of cough today, she feels a little congested. She also continues to have hematuria, but reports that it is less than its been.   Objective: Temp:  [97.8 F (36.6 C)-98.9 F (37.2 C)] 98 F (36.7 C) (11/01 0320) Pulse Rate:  [81-107] 101 (11/01 0320) Resp:  [12-20] 14 (11/01 0320) BP: (139-157)/(99-109) 151/99 (11/01 0320) SpO2:  [94 %-100 %] 94 % (11/01 0320) Weight:  [73.1 kg] 73.1 kg (11/01 0320) Physical Exam: General: Frail appearing, conversant, white woman, no acute distress Cardiovascular: RRR, NRMG Respiratory: Crackles, and expiratory wheezing heard on exam in lower lung fields Abdomen: Soft, NTTP, no fluid wave Extremities: Pitting edema in lower extremities up  to hips  Laboratory: Recent Labs  Lab 04/15/21 1800 04/16/21 0217 04/17/21 0254  WBC 6.7 5.6 4.8  HGB 8.4* 8.0* 8.7*  HCT 27.1* 25.2* 27.7*  PLT 167 144* 172   Recent Labs  Lab  04/15/21 1027 04/15/21 1031 04/16/21 0217 04/16/21 0718 04/16/21 1838  NA 136   < > 136 135 138  K 3.7   < > 3.5 3.8 3.6  CL 107   < > 106 106 107  CO2 19*   < > 21* 22 22  BUN 11   < > 14 14 16   CREATININE 2.11*   < > 2.23* 2.21* 2.28*  CALCIUM 8.0*   < > 7.6* 7.6* 8.1*  PROT 7.0  --  5.5*  --   --   BILITOT 0.8  --  0.7  --   --   ALKPHOS 89  --  74  --   --   ALT 31  --  25  --   --   AST 40  --  31  --   --   GLUCOSE 107*   < > 112* 86 78   < > = values in this interval not displayed.      Imaging/Diagnostic Tests:   Holley Bouche, MD 04/17/2021, 6:36 AM PGY-1, [place] Intern pager: [phone], text pages welcome

## 2021-04-17 NOTE — Progress Notes (Signed)
Mobility Specialist Progress Note    04/17/21 1153  Mobility  Activity Ambulated in hall  Level of Assistance Independent after set-up  Assistive Device None  Distance Ambulated (ft) 470 ft  Mobility Ambulated independently in hallway  Mobility Response Tolerated well  Mobility performed by Mobility specialist  $Mobility charge 1 Mobility   Pre-Mobility: 107 HR, 149/109 BP, 95% SpO2 Post-Mobility: 125 HR, 96% SpO2  Pt received in bed and agreeable. C/o having mucous in cough. Returned to EOB with call bell in reach.   Hildred Alamin Mobility Specialist  Mobility Specialist Phone: [phone]

## 2021-04-17 NOTE — Progress Notes (Signed)
CSW received consult for substance use resources for patient. CSW spoke with patient at bedside. CSW offered patient outpatient substance use treatment services resources. Patient accepted. Patient requested resources for medicaid. CSW provided patient with resources for medicaid and disability. Patient accepted. All questions answered. No further questions at this time.

## 2021-04-17 NOTE — Progress Notes (Signed)
Nephrology Follow-Up Consult note   Assessment/Recommendations: Robyn Shaffer is a/an 35 y.o. female with a past medical history significant for heroine use disorder currently in remission, infective endocarditis s/p TV replacement, and nephrolithiasis  who present w/ hematuria, hypoxia and heart failure    Non-Oliguric AKI w/ concern for nephritic syndrome: Nephritic syndrome doesn't often cause macroscopic hematuria but it can occur. Urine sediment evaluation with hematuria but minimally dysmorphic, no cellular casts. Also with nephrotic range proteinuria at 5g. Differential is broad but my money is on peri-infectious GN; possibly resolving given report of previously worse proteinuria at 10g a couple weeks ago. Would hold on any immunosuppression until all sources of infection have been ruled out unless kidney function deteriorates quickly. -IR consult for kidney biopsy; appreciate help. Likely 11/2 after BP improved -Serologies pending; only abnormality thus faris Hep C ab + -Continue working up all infection sources; given her pericardial effusion and lung abnormalities she may benefit from ID consult. On ceftriaxone and doxycycline. -Agree with diuresis on lasix 120mg  BID -If hematuria worsens or clots are passed may consider renal CT and urology involvement -Continue to monitor daily Cr, Dose meds for GFR -Monitor Daily I/Os, Daily weight  -Maintain MAP>65 for optimal renal perfusion.  -Avoid nephrotoxic medications including NSAIDs  -Currently no indication for HD   CHF exacerbation: acute w/ systolic dysfunction. Diuretics as above. Cardiology involved. Patient feels like it is improving.   Hx of endocarditis/Tricuspid valve replacement: On doxycyline and ceftriaxone. Bcx negative so far. CTM.   Hypertension: BP elevated above goal of 295 systolic today. Continue diuresis and add coreg 6.25mg  BID. Will give clonidine in AM if BP still high  Normocytic anemia: Hemoglobin 8.  Likely  multifactorial related to inflammation.  Management per primary   H/o opioid use disorder: no recent use. On suboxone.   Recommendations conveyed to primary service.    [facility] 04/17/2021 10:06 AM  ___________________________________________________________  CC: AKI, hematuria  Interval History/Subjective: Feels fine today. Waiting for biopsy. Urinating well and edema improving. Hep C ab+ and says she was told about this before but no active viral issue in the past.  Attempted kidney biopsy today but blood pressure was too elevated.  Medications:  Current Facility-Administered Medications  Medication Dose Route Frequency Provider Last Rate Last Admin   0.9 %  sodium chloride infusion   Intravenous PRN Lenoria Chime, MD   Stopped at 04/16/21 [phone]   acetaminophen (TYLENOL) tablet 650 mg  650 mg Oral Q6H Carollee Leitz, MD   650 mg at 04/17/21 0850   buprenorphine-naloxone (SUBOXONE) 8-2 mg per SL tablet 1 tablet  1 tablet Sublingual Daily Dameron, Marisa, DO   1 tablet at 04/17/21 0850   diclofenac Sodium (VOLTAREN) 1 % topical gel 4 g  4 g Topical QID PRN Lenoria Chime, MD   4 g at 04/16/21 2154   doxycycline (VIBRA-TABS) tablet 100 mg  100 mg Oral Q12H Carollee Leitz, MD   100 mg at 04/17/21 0850   famotidine (PEPCID) tablet 20 mg  20 mg Oral Daily Dameron, Marisa, DO   20 mg at 04/17/21 0850   furosemide (LASIX) 120 mg in dextrose 5 % 50 mL IVPB  120 mg Intravenous Q12H Carollee Leitz, MD   Stopped at 04/16/21 2327   hydrOXYzine (ATARAX/VISTARIL) tablet 50 mg  50 mg Oral TID Orvis Brill, DO   50 mg at 04/17/21 0850   influenza vac split quadrivalent PF (FLUARIX) injection 0.5  mL  0.5 mL Intramuscular Tomorrow-1000 Pray, Norwood Levo, MD       lidocaine (PF) (XYLOCAINE) 1 % injection            nicotine (NICODERM CQ - dosed in mg/24 hours) patch 21 mg  21 mg Transdermal Daily Alen Bleacher, MD   21 mg at 04/17/21 0850   QUEtiapine (SEROQUEL)  tablet 50 mg  50 mg Oral QHS Dameron, Luna Fuse, DO   50 mg at 04/16/21 2122      Review of Systems: 10 systems reviewed and negative except per interval history/subjective  Physical Exam: Vitals:   04/17/21 0916 04/17/21 0930  BP: (!) 163/124 (!) 168/120  Pulse: 100 (!) 103  Resp: 17 (!) 22  Temp:    SpO2: 99% 98%   No intake/output data recorded.  Intake/Output Summary (Last 24 hours) at 04/17/2021 1006 Last data filed at 04/17/2021 0335 Gross per 24 hour  Intake 775.37 ml  Output 1650 ml  Net -874.63 ml   Constitutional: well-appearing, no acute distress ENMT: ears and nose without scars or lesions, MMM CV: normal rate, no edema Respiratory: clear to auscultation, normal work of breathing Gastrointestinal: soft, non-tender, no palpable masses or hernias Skin: no visible lesions or rashes Psych: alert, judgement/insight appropriate, appropriate mood and affect   Test Results I personally reviewed new and old clinical labs and radiology tests Lab Results  Component Value Date   NA 133 (L) 04/17/2021   K 3.9 04/17/2021   CL 103 04/17/2021   CO2 19 (L) 04/17/2021   BUN 18 04/17/2021   CREATININE 2.41 (H) 04/17/2021   CALCIUM 7.7 (L) 04/17/2021   ALBUMIN 1.9 (L) 04/16/2021

## 2021-04-18 ENCOUNTER — Inpatient Hospital Stay (HOSPITAL_COMMUNITY): Payer: Medicaid - Out of State

## 2021-04-18 DIAGNOSIS — J810 Acute pulmonary edema: Secondary | ICD-10-CM | POA: Diagnosis not present

## 2021-04-18 DIAGNOSIS — I5021 Acute systolic (congestive) heart failure: Secondary | ICD-10-CM | POA: Diagnosis not present

## 2021-04-18 DIAGNOSIS — R0603 Acute respiratory distress: Secondary | ICD-10-CM | POA: Diagnosis not present

## 2021-04-18 DIAGNOSIS — Z8614 Personal history of Methicillin resistant Staphylococcus aureus infection: Secondary | ICD-10-CM | POA: Diagnosis not present

## 2021-04-18 DIAGNOSIS — R0600 Dyspnea, unspecified: Secondary | ICD-10-CM | POA: Diagnosis not present

## 2021-04-18 DIAGNOSIS — Z952 Presence of prosthetic heart valve: Secondary | ICD-10-CM

## 2021-04-18 DIAGNOSIS — R319 Hematuria, unspecified: Secondary | ICD-10-CM | POA: Diagnosis not present

## 2021-04-18 LAB — PROTEIN ELECTROPHORESIS, SERUM
A/G Ratio: 0.8 (ref 0.7–1.7)
Albumin ELP: 2.7 g/dL — ABNORMAL LOW (ref 2.9–4.4)
Alpha-1-Globulin: 0.3 g/dL (ref 0.0–0.4)
Alpha-2-Globulin: 0.6 g/dL (ref 0.4–1.0)
Beta Globulin: 0.8 g/dL (ref 0.7–1.3)
Gamma Globulin: 1.6 g/dL (ref 0.4–1.8)
Globulin, Total: 3.4 g/dL (ref 2.2–3.9)
M-Spike, %: 0.4 g/dL — ABNORMAL HIGH
Total Protein ELP: 6.1 g/dL (ref 6.0–8.5)

## 2021-04-18 LAB — BASIC METABOLIC PANEL
Anion gap: 9 (ref 5–15)
Anion gap: 10 (ref 5–15)
BUN: 26 mg/dL — ABNORMAL HIGH (ref 6–20)
BUN: 26 mg/dL — ABNORMAL HIGH (ref 6–20)
CO2: 20 mmol/L — ABNORMAL LOW (ref 22–32)
CO2: 22 mmol/L (ref 22–32)
Calcium: 7.8 mg/dL — ABNORMAL LOW (ref 8.9–10.3)
Calcium: 8.2 mg/dL — ABNORMAL LOW (ref 8.9–10.3)
Chloride: 105 mmol/L (ref 98–111)
Chloride: 104 mmol/L (ref 98–111)
Creatinine, Ser: 2.55 mg/dL — ABNORMAL HIGH (ref 0.44–1.00)
Creatinine, Ser: 2.52 mg/dL — ABNORMAL HIGH (ref 0.44–1.00)
GFR, Estimated: 25 mL/min — ABNORMAL LOW (ref >60)
GFR, Estimated: 25 mL/min — ABNORMAL LOW (ref >60)
Glucose, Bld: 82 mg/dL (ref 70–99)
Glucose, Bld: 74 mg/dL (ref 70–99)
Potassium: 3.9 mmol/L (ref 3.5–5.1)
Potassium: 4.5 mmol/L (ref 3.5–5.1)
Sodium: 134 mmol/L — ABNORMAL LOW (ref 135–145)
Sodium: 136 mmol/L (ref 135–145)

## 2021-04-18 LAB — HCV RNA QUANT: HCV Quantitative: NOT DETECTED IU/mL (ref >50)

## 2021-04-18 LAB — CBC
HCT: 28.7 % — ABNORMAL LOW (ref 36.0–46.0)
Hemoglobin: 9.5 g/dL — ABNORMAL LOW (ref 12.0–15.0)
MCH: 30.1 pg (ref 26.0–34.0)
MCHC: 33.1 g/dL (ref 30.0–36.0)
MCV: 90.8 fL (ref 80.0–100.0)
Platelets: 229 10*3/uL (ref 150–400)
RBC: 3.16 MIL/uL — ABNORMAL LOW (ref 3.87–5.11)
RDW: 19.8 % — ABNORMAL HIGH (ref 11.5–15.5)
WBC: 5.7 10*3/uL (ref 4.0–10.5)
nRBC: 0 % (ref 0.0–0.2)

## 2021-04-18 LAB — HISTONE ANTIBODIES, IGG, BLOOD: DNA-Histone: 2.8 Units — ABNORMAL HIGH (ref 0.0–0.9)

## 2021-04-18 LAB — PATHOLOGIST SMEAR REVIEW

## 2021-04-18 LAB — RHEUMATOID FACTOR: Rheumatoid fact SerPl-aCnc: 18.1 IU/mL — ABNORMAL HIGH (ref <14.0)

## 2021-04-18 MED ORDER — GELATIN ABSORBABLE 12-7 MM EX MISC
CUTANEOUS | Status: AC
  Filled 2021-04-18: qty 1

## 2021-04-18 MED ORDER — MIDAZOLAM HCL 2 MG/2ML IJ SOLN
INTRAMUSCULAR | Status: AC
  Filled 2021-04-18: qty 2

## 2021-04-18 MED ORDER — HYDRALAZINE HCL 20 MG/ML IJ SOLN
INTRAMUSCULAR | Status: AC | PRN
  Administered 2021-04-18: 10 mg via INTRAVENOUS

## 2021-04-18 MED ORDER — LIDOCAINE-EPINEPHRINE 1 %-1:100000 IJ SOLN
INTRAMUSCULAR | Status: AC
  Filled 2021-04-18: qty 1

## 2021-04-18 MED ORDER — FENTANYL CITRATE (PF) 100 MCG/2ML IJ SOLN
INTRAMUSCULAR | Status: AC
  Filled 2021-04-18: qty 2

## 2021-04-18 MED ORDER — FENTANYL CITRATE (PF) 100 MCG/2ML IJ SOLN
INTRAMUSCULAR | Status: AC | PRN
  Administered 2021-04-18: 50 ug via INTRAVENOUS
  Administered 2021-04-18: 25 ug via INTRAVENOUS

## 2021-04-18 MED ORDER — MIDAZOLAM HCL 2 MG/2ML IJ SOLN
INTRAMUSCULAR | Status: AC | PRN
  Administered 2021-04-18: 1 mg via INTRAVENOUS

## 2021-04-18 MED ORDER — CARVEDILOL 6.25 MG PO TABS: 6.2500 mg | ORAL_TABLET | Freq: Once | ORAL | Status: DC

## 2021-04-18 MED ORDER — CLONIDINE HCL 0.2 MG PO TABS
0.2000 mg | ORAL_TABLET | Freq: Once | ORAL | Status: AC
  Administered 2021-04-18: 0.2 mg via ORAL
  Filled 2021-04-18: qty 1

## 2021-04-18 MED ORDER — HYDRALAZINE HCL 20 MG/ML IJ SOLN
INTRAMUSCULAR | Status: AC
  Filled 2021-04-18: qty 1

## 2021-04-18 MED ORDER — CARVEDILOL 12.5 MG PO TABS
12.5000 mg | ORAL_TABLET | Freq: Two times a day (BID) | ORAL | Status: DC
  Administered 2021-04-18 – 2021-04-27 (×19): 12.5 mg via ORAL
  Filled 2021-04-18 (×20): qty 1

## 2021-04-18 NOTE — Progress Notes (Addendum)
Nephrology Follow-Up Consult note   Assessment/Recommendations: Robyn Shaffer is a/an 35 y.o. 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     Non-Oliguric/Anuric AKI (stable): Creatinine slightly bumped this morning. Likely secondary to nephritic syndrome- possibly resolving peri-infectious GN vs lupus nephritis given +ANA, +dsDNA, though would expect decreased complement. Elevated kappa and lambda free light chains could be in the setting of renal disease and not necessarily indicative of plasma disorder.  -Renal biopsy obtained today, only complication was post-procedural perinephric hematoma; will need to await biopsy results as this will help guide treatment -Continue work up for infectious source. One positive blood culture with Staph hominis, could be contaminant but would consider ID consult for input.  -Still with significant UOP, continue diuresis with Lasix 120 mg BID -Continue to monitor daily Cr, Dose meds for GFR -Monitor Daily I/Os, Daily weight  -Maintain MAP>65 for optimal renal perfusion.  -Avoid nephrotoxic medications including NSAIDs and Vanc/Zosyn combo -Currently no indication for HD  Acute HFmrEF Exacerbation: Continue diuresis. Cardiology on board.  Hx of endocarditis and TVR: On Doxycycline. 1 positive Bcx with Staph hominis. Afebrile.   Hypertension: Started on Coreg 6.125 mg BID, now increased to 12.5 mg BID. BP this AM 163/112, one dose Clonidine given. CTM.   Normocytic Anemia: Hgb improving- 8.7>9.5. Transfuse for Hgb<7 g/dL  H/o opioid use disorder: Clean for the last two months. On Suboxone. TOC provided patient with substance abuse resources for the outpatient setting.   Volume Status: Appears hypervolemic on exam. Based on  our examination and review of available imaging, our recommendation is continued diuresis.  Recommendations conveyed to primary service.    Robyn Shaffer, PGY-2 Family Medicine Resident 04/18/2021 8:25 AM  ___________________________________________________________  CC: Shortness of breath, swelling  Interval History/Subjective: Robyn Shaffer is still feeling a little swollen today. She has no pain with urination but is still having blood in her urine. She does note that her family has a history of ankylosing spondylitis and that she has never been tested for it. She feels that she has arthritis in her hips and her hands. She does note intermittent shortness of breath but doesn't use oxygen at home nor does she feel a difference when she is on oxygen here in the hospital. She denies any history of eye problems or skin infections. She notes some tenderness around her umbilicus. She denies family history of Lupus.   Medications:  Current Facility-Administered Medications  Medication Dose Route Frequency Provider Last Rate Last Admin   0.9 %  sodium chloride infusion   Intravenous PRN Robyn Chime, MD   Stopped at 04/16/21 [phone]   acetaminophen (TYLENOL) tablet 650 mg  650 mg Oral Q6H Robyn Leitz, MD   650 mg at 04/18/21 0820   buprenorphine-naloxone (SUBOXONE) 8-2 mg per SL tablet 1 tablet  1 tablet Sublingual Daily Shaffer, Marisa, DO   1 tablet at 04/17/21 0850   carvedilol (COREG) tablet 12.5 mg  12.5 mg Oral BID WC Robyn Chew, MD   12.5 mg at 04/18/21 0820   diclofenac Sodium (VOLTAREN) 1 % topical gel 4 g  4 g Topical QID PRN Robyn Chime, MD   4 g at 04/16/21 2154   doxycycline (VIBRA-TABS) tablet 100 mg  100 mg Oral Q12H Robyn Leitz, MD   100 mg at 04/17/21 2106  famotidine (PEPCID) tablet 20 mg  20 mg Oral Daily Shaffer, Marisa, DO   20 mg at 04/17/21 0850   furosemide (LASIX) 120 mg in dextrose 5 % 50 mL IVPB  120 mg Intravenous Q12H Robyn Leitz, MD   Stopped at  04/17/21 2309   hydrOXYzine (ATARAX/VISTARIL) tablet 50 mg  50 mg Oral TID Robyn Brill, DO   50 mg at 04/17/21 2106   influenza vac split quadrivalent PF (FLUARIX) injection 0.5 mL  0.5 mL Intramuscular Tomorrow-1000 Robyn Chime, MD       nicotine (NICODERM CQ - dosed in mg/24 hours) patch 21 mg  21 mg Transdermal Daily Robyn Bleacher, MD   21 mg at 04/17/21 0850   QUEtiapine (SEROQUEL) tablet 50 mg  50 mg Oral QHS Shaffer, Luna Fuse, DO   50 mg at 04/17/21 2107     Review of Systems: 10 systems reviewed and negative except per interval history/subjective  Physical Exam: Vitals:   04/18/21 0304 04/18/21 0751  BP: (!) 149/98 (!) 163/112  Pulse: 98 95  Resp: 14 20  Temp: 98.2 F (36.8 C) 97.6 F (36.4 C)  SpO2: 96% 96%   No intake/output data recorded.  Intake/Output Summary (Last 24 hours) at 04/18/2021 0825 Last data filed at 04/18/2021 0100 Gross per 24 hour  Intake 421.63 ml  Output 2000 ml  Net -1578.37 ml   Constitutional: pleasant, cooperative  ENMT: MMM, nares patent CV: normal rate, trace to 1+ pitting edema b/l lower extremities to upper shin Respiratory: normal work of breathing on room air, lungs with diffuse crackles b/l  Gastrointestinal: soft, mildly distended, with mild tenderness peri-umbilically without rebound or guarding Psych: alert, judgement/insight appropriate, appropriate mood and affect   Test Results I personally reviewed new and old clinical labs and radiology tests Lab Results  Component Value Date   NA 134 (L) 04/18/2021   K 3.9 04/18/2021   CL 105 04/18/2021   CO2 20 (L) 04/18/2021   BUN 26 (H) 04/18/2021   CREATININE 2.55 (H) 04/18/2021   CALCIUM 7.8 (L) 04/18/2021   ALBUMIN 1.9 (L) 04/16/2021

## 2021-04-18 NOTE — Procedures (Signed)
Pre Procedure Dx: Proteinuria of uncertain etiology Post Procedural Dx: Same  Technically successful US guided biopsy of inferior pole of the left kidney  EBL: Minimal Procedure complicated by development of an asymptomatic perinephric hematoma. Pt remained asymptomatic during prolonged recovery in the IR department.   Ronny Bacon, MD Pager #: [phone]

## 2021-04-18 NOTE — Progress Notes (Signed)
.Family Medicine Teaching Service Daily Progress Note Intern Pager: [phone]  Patient name: Robyn Shaffer Medical record number: [id number] Date of birth: Jul 14, 1985 Age: 35 y.o. Gender: female  Primary Care Provider: Patient, No Pcp Per (Inactive) Consultants: Nephrology, Cardiology Code Status: Full  Pt Overview and Major Events to Date:  10/30 - admitted  Assessment and Plan:  Robyn Shaffer is a 35 year old female who was admitted for shortness of breath and frank hematuria.  PMH is significant for endocarditis s/p tricuspid valve replacement, anemia, IV drug use, and polysubstance abuse.  Acute CHF exacerbation Weight 70.2 from 73.1 yesterday. She had 2 L of urine output. She reports that her breathing is improved today, but still complains of a productive cough. She is still sating in the 90's on room air. Her fluid status is improved with 1+ pitting edema in her legs and minimal pitting at the level of her hips. Her is elevated to Cr 2.55 (2.42, 2.41) today.  -Daily Weights -Daily I/O's -Continue Lasix 120 BID -Appreciate cardiology recs -Continue Doxycycline 100 mg every 12 hours for 6 months (10/14-) -BMP BID -Daily CBC -Cardiorespiratory monitoring   CKD IIIb  AKI  nephrotic kidney disease  Hematuria  HTN IR biopsy conducted today after stabilizing patient BP for procedure. Procedure ended with perinephric hematoma. Patient continues to have good UOP, with steady rise in Cr. Nephrology recommend's continuing lasix. Patient serologies showed elevated kappa and lambda free light chains, Positive ANA, elevated dsDNA Ab. Still awaiting results of Cryoglobin, Rheumatoid factor, Hep C genotype.  -Trend UOP -Trend Cr -F/u Serologies -F/u Cryglobin, Rheumatoid factor, Hep C genotype -Coreg 12.5 BID w/ clonidine 0.2 mg for BP control -Avoid nephrotoxic medications    Anemia Transfusion threshold less than 7. Hgb 9.5 today before procedure. Patient kidney biopsy resulted  in perinephric hematoma.    Polysubstance abuse History of alcohol abuse, COWS 0 today -Nicotine patch -Continue Suboxone, 8-2 mg  FEN/GI: Heart Healthy Diet PPx: Holding prophylactic due to increased risk of bleeding Dispo:Home pending clinical improvement .   Subjective:  Patient sitting comfortably in bed, complains of productive cough, but is having easier time breathing, and feels that her legs are less edematous.  Objective: Temp:  [98 F (36.7 C)-98.2 F (36.8 C)] 98.2 F (36.8 C) (11/02 0304) Pulse Rate:  [91-107] 98 (11/02 0304) Resp:  [14-22] 14 (11/02 0304) BP: (131-168)/(93-124) 149/98 (11/02 0304) SpO2:  [95 %-99 %] 96 % (11/02 0304) Weight:  [70.2 kg] 70.2 kg (11/02 0500) Physical Exam: General: Sick appearing, good affect, polite, white female, no acute signs of distress Cardiovascular: RRR, NRMG Respiratory: Coarse breath sounds diffusely with crackles heard in lower lung fields, no wheezing or stridor.  Abdomen: Soft, NTTP, non-distended, no fluid wave Extremities: Edema in lower extremities, pulses intact in all extremities, cap refill < 2 sec  Laboratory: Recent Labs  Lab 04/15/21 1800 04/16/21 0217 04/17/21 0254  WBC 6.7 5.6 4.8  HGB 8.4* 8.0* 8.7*  HCT 27.1* 25.2* 27.7*  PLT 167 144* 172   Recent Labs  Lab 04/15/21 1027 04/15/21 1031 04/16/21 0217 04/16/21 0718 04/16/21 1838 04/17/21 0701 04/17/21 1809  NA 136   < > 136   < > 138 133* 136  K 3.7   < > 3.5   < > 3.6 3.9 4.7  CL 107   < > 106   < > 107 103 105  CO2 19*   < > 21*   < > 22 19* 23  BUN  11   < > 14   < > 16 18 20   CREATININE 2.11*   < > 2.23*   < > 2.28* 2.41* 2.42*  CALCIUM 8.0*   < > 7.6*   < > 8.1* 7.7* 8.2*  PROT 7.0  --  5.5*  --   --   --   --   BILITOT 0.8  --  0.7  --   --   --   --   ALKPHOS 89  --  74  --   --   --   --   ALT 31  --  25  --   --   --   --   AST 40  --  31  --   --   --   --   GLUCOSE 107*   < > 112*   < > 78 72 78   < > = values in this  interval not displayed.     Imaging/Diagnostic Tests:   Holley Bouche, MD 04/18/2021, 5:25 AM PGY-1, [place] Intern pager: [phone], text pages welcome

## 2021-04-18 NOTE — Plan of Care (Signed)
  Problem: Education: Goal: Knowledge of General Education information will improve Description: Including pain rating scale, medication(s)/side effects and non-pharmacologic comfort measures Outcome: Progressing   Problem: Clinical Measurements: Goal: Ability to maintain clinical measurements within normal limits will improve Outcome: Progressing   Problem: Clinical Measurements: Goal: Diagnostic test results will improve Outcome: Progressing   Problem: Clinical Measurements: Goal: Respiratory complications will improve Outcome: Progressing   Problem: Clinical Measurements: Goal: Cardiovascular complication will be avoided Outcome: Progressing   Problem: Coping: Goal: Level of anxiety will decrease Outcome: Progressing   Problem: Pain Managment: Goal: General experience of comfort will improve Outcome: Progressing   Problem: Safety: Goal: Ability to remain free from injury will improve Outcome: Progressing   Problem: Skin Integrity: Goal: Risk for impaired skin integrity will decrease Outcome: Progressing

## 2021-04-18 NOTE — Consult Note (Signed)
[name] for Infectious Disease  Total days of antibiotics 4/ day 2 doxy               Reason for Consult: staph bacteremia   Referring Physician: pray  Active Problems:   Acute CHF [facility])    HPI: Robyn Shaffer is a 35 y.o. female with recent hx of IVDU, opiate/cocaine usage up until recent hospitalization in [place], where she was hospitalized for MRSA bacteremia found to have native TV endocarditis c/b septic pulmonary emboli, respiratory distress requiring intubation,transfer from local hospital to [facility]. She was persistently bacteremic from 8/18 through 9/1. She underwent bioprosthetic TVR and closure of PFO on 8/30. She was on vancomycin initially ( MRSA - vanco mic 1, dapto 0.5) from 8/23-8/31. 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 Endocarditis) Due to meeting criteria for valve replacement and bacteremic up until surgery, she would need 6 wk of abtx therapy. She was enrolled in the OPTIMAL trial ( 6wk SOC vs. 2 wk Iv + 4 wk orals experimental arm) transitioned to daptomycin plus rifampin x 2 wk followed by 4 wk of linezolid + rifampin to end on 10/13, with plan of 6 months of doxy for chronic suppression. On admit she had still been taking oral doxycycline. She has not had IV drug use but drug screen +amphetamines. On this admission found to have acute SOB, DOE, LE swelling, elevated BNP of > 4,500. Blood cx showing 1 of 4 bottles with staph hominis. ID consult asked to weigh in if CoNS is contaminant vs. True pathogen. She is afebrile, no leukocytosis, no systemic signs of infection. TTE does not show any new vegetation, sign of TVR and residual chordae tendinae.  She is now relocating to [place] from [facility] to live with mom. Her mom has dogs and kittens. She also reports non-healing ulcer on right lower leg from previous skin biopsy site which is draining pinpoint purulence.  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 Renita Papa - Microbiology: [facility]: - 02/01/2021 Blood cultures X 2 pediatric sets: MRSA (vancomycin MIC 2, rifampin MIC <1, tetracycline MIC <4) in 2 of 2 bottles - 02/02/2021 Blood cultures X 2 sets: MRSA in 2 of 4 bottles (both sets) - 02/02/2021 Sputum culture: MRSA as well as yeast and usual flora - 02/04/2021 Blood cultures X 2 sets: MRSA in 2 of 4 bottles (both sets) [facility]: - 02/05/2021 BAL culture: 15,000 MRSA - 02/05/2021 Blood cultures X 2 sets: MRSA (vancomycin MIC 1, daptomycin MIC 0.5, ceftaroline susceptible, rifampin MIC <=0.5, Linezolid MIC 2, tetracycline MIC <=1, doxycycline MIC <=0.5) in 2 of 2 sets - 02/07/2021 Blood cultures X 2 sets: MRSA in 2 of 2 sets - 02/09/2021 Blood cultures X 2 sets: MRSA in 2 of 2 sets - 02/11/2021 Blood cultures X 2 sets: MRSA in 2 of 2 sets - 02/13/2021 Blood cultures X 2 sets: MRSA in 2 of 2 sets - 02/13/2021 Operative culture from tricuspid valve tissue: MRSA - 02/13/2021 BAL culture: MRSA - 02/14/2021 Blood cultures X 2 sets: MRSA in 2 of 2 sets - 02/15/2021 Blood cultures X 2 sets:  MRSA in 2 of 2 sets - 02/16/2021 Blood cultures X 2 sets: No growth (clearance of bacteremia achieved) - 02/23/2021 Right pleural fluid culture: No growth - 02/25/2021 Blood cultures X 2 sets: Pending - Pathology: 02/13/2021 Surgical pathology: Valve tissue with fibrosis and adherent vegetations containing numerous colonies of bacterial organisms (predominantly gram positive cocci with nonviable forms) consistent  with endocarditis - Antimicrobial Therapy: Daptomycin 8 mg/kg IV Q24 hours + rifampin 300 mg PO BID - Duration of antimicrobial therapy and end date: 6 weeks (2 weeks IV + 4 weeks PO) dated from negative blood cultures (02/16/2021 = day 1); end date 03/29/2021 - Transition to linezolid 600 mg PO BID and rifampin 300 mg PO BID for the last 4 weeks (03/02/2021-03/29/2021) of a total 6-week antimicrobial treatment   Past Medical History:  Diagnosis Date   Endocarditis    s/p tricuspid valve replacement 01/2021   Kidney infection    Polysubstance abuse ([place])     Allergies:  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     MEDICATIONS:  acetaminophen  650 mg Oral Q6H   buprenorphine-naloxone  1 tablet Sublingual Daily   carvedilol  12.5 mg Oral BID WC   doxycycline  100 mg Oral Q12H   famotidine  20 mg Oral Daily   gelatin adsorbable       hydrOXYzine  50 mg Oral TID   influenza vac split quadrivalent PF  0.5 mL Intramuscular Tomorrow-1000   lidocaine-EPINEPHrine       nicotine  21 mg Transdermal Daily   QUEtiapine  50 mg Oral QHS    Social History   Tobacco Use   Smoking status: Every Day    Types: Cigarettes   Smokeless tobacco: Never  Vaping Use   Vaping Use: Unknown  Substance Use Topics   Drug use: No    Family History  Problem Relation Age of Onset   Hypertension Mother    Hypertension Father    Heart attack Maternal Grandfather    Review of Systems  Constitutional: Negative for fever, chills, diaphoresis, activity change, appetite change, fatigue and unexpected weight change.  HENT: Negative for congestion, sore throat, rhinorrhea, sneezing, trouble swallowing and sinus pressure.  Eyes: Negative for photophobia and visual disturbance.  Respiratory:  positive for cough, chest tightness, shortness of breath, wheezing and stridor.  Cardiovascular: Negative for chest pain, palpitations and leg swelling.   Gastrointestinal: Negative for nausea, vomiting, abdominal pain, diarrhea, constipation, blood in stool, abdominal distention and anal bleeding.  Genitourinary: Negative for dysuria, hematuria, flank pain and difficulty urinating.  Musculoskeletal: Negative for myalgias, back pain, joint swelling, arthralgias and gait problem.  Skin: +wound/scabs. Negative for color change, pallor, rash and wound.  Neurological: Negative for dizziness, tremors, weakness and light-headedness.  Hematological: Negative for adenopathy. Does not bruise/bleed easily.  Psychiatric/Behavioral: Negative for behavioral problems, confusion, sleep disturbance, dysphoric mood, decreased concentration and agitation.    OBJECTIVE: Temp:  [97.6 F (36.4 C)-98.2 F (36.8 C)] 98 F (36.7 C) (11/02 1215) Pulse Rate:  [86-101] 86 (11/02 1215) Resp:  [14-22] 20 (11/02 1215) BP: (122-166)/(74-116) 122/78 (11/02 1215) SpO2:  [95 %-100 %] 95 % (11/02 1215) Weight:  [70.2 kg] 70.2 kg (11/02 0500) Physical Exam  Constitutional:  oriented to person, place, and time. appears well-developed and well-nourished. No distress.  HENT: [place]/AT, PERRLA, no scleral icterus Mouth/Throat: Oropharynx is clear and moist. No oropharyngeal exudate.  Cardiovascular: Normal rate, regular rhythm and  normal heart sounds. Exam reveals no gallop and no friction rub.  No murmur heard.  Pulmonary/Chest: Effort normal and breath sounds normal. No respiratory distress.  has no wheezes.  Neck = supple, no nuchal rigidity Abdominal: Soft. Bowel sounds are normal.  exhibits no distension. There is no tenderness.  Lymphadenopathy: no cervical adenopathy. No axillary adenopathy Neurological: alert and oriented to person, place, and time.  Skin: Skin is warm and dry. Numerous scars to arms and legs. Right lower leg small ulcer with pinpoint purulence slightly tender, small mm edge of erythema. Psychiatric: a normal mood and affect.  behavior is normal.     LABS: Results for orders placed or performed during the hospital encounter of 04/15/21 (from the past 48 hour(s))  Basic metabolic panel     Status: Abnormal   Collection Time: 04/16/21  6:38 PM  Result Value Ref Range   Sodium 138 135 - 145 mmol/L   Potassium 3.6 3.5 - 5.1 mmol/L   Chloride 107 98 - 111 mmol/L   CO2 22 22 - 32 mmol/L   Glucose, Bld 78 70 - 99 mg/dL    Comment: Glucose reference range applies only to samples taken after fasting for at least 8 hours.   BUN 16 6 - 20 mg/dL   Creatinine, Ser 2.28 (H) 0.44 - 1.00 mg/dL   Calcium 8.1 (L) 8.9 - 10.3 mg/dL   GFR, Estimated 28 (L) >60 mL/min    Comment: (NOTE) Calculated using the CKD-EPI Creatinine Equation (2021)    Anion gap 9 5 - 15    Comment: Performed at [facility] [address], [place], [place] [address]  CBC     Status: Abnormal   Collection Time: 04/17/21  2:54 AM  Result Value Ref Range   WBC 4.8 4.0 - 10.5 K/uL   RBC 2.94 (L) 3.87 - 5.11 MIL/uL   Hemoglobin 8.7 (L) 12.0 - 15.0 g/dL   HCT 27.7 (L) 36.0 - 46.0 %   MCV 94.2 80.0 - 100.0 fL   MCH 29.6 26.0 - 34.0 pg   MCHC 31.4 30.0 - 36.0 g/dL   RDW 19.7 (H) 11.5 - 15.5 %   Platelets 172 150 - 400 K/uL   nRBC 0.0 0.0 - 0.2 %    Comment: Performed at [facility], [place] [address] [place], [place] [address]  Protime-INR     Status: None   Collection Time: 04/17/21  2:54 AM  Result Value Ref Range   Prothrombin Time 14.9 11.4 - 15.2 seconds   INR 1.2 0.8 - 1.2    Comment: (NOTE) INR goal varies based on device and disease states. Performed at [facility], [place] [address] [place], [place] [address]   Basic metabolic panel     Status: Abnormal   Collection Time: 04/17/21  7:01 AM  Result Value Ref Range   Sodium 133 (L) 135 - 145 mmol/L   Potassium 3.9 3.5 - 5.1 mmol/L   Chloride 103 98 - 111 mmol/L   CO2 19 (L) 22 - 32 mmol/L   Glucose, Bld 72 70 - 99 mg/dL    Comment: Glucose reference range applies only to samples taken  after fasting for at least 8 hours.   BUN 18 6 - 20 mg/dL   Creatinine, Ser 2.41 (H) 0.44 - 1.00 mg/dL   Calcium 7.7 (L) 8.9 - 10.3 mg/dL   GFR, Estimated 26 (L) >60 mL/min    Comment: (NOTE) Calculated using the  CKD-EPI Creatinine Equation (2021)    Anion gap 11 5 - 15    Comment: Performed at [facility], [place] [address] [place], [place] [address]  Rheumatoid factor     Status: Abnormal   Collection Time: 04/17/21  7:58 AM  Result Value Ref Range   Rhuematoid fact SerPl-aCnc 18.1 (H) <14.0 IU/mL    Comment: (NOTE) Performed At: [facility] [place], [place] [id number] Rush Karis MD [id number]   HCV RNA quant     Status: None   Collection Time: 04/17/21  7:58 AM  Result Value Ref Range   HCV Quantitative HCV Not Detected >50 IU/mL   Test Information Comment     Comment: (NOTE) The quantitative range of this assay is 15 IU/mL to 100 million IU/mL. Performed At: [facility] [place], [place] [id number] Rush Dildine MD [id number]   Basic metabolic panel     Status: Abnormal   Collection Time: 04/17/21  6:09 PM  Result Value Ref Range   Sodium 136 135 - 145 mmol/L   Potassium 4.7 3.5 - 5.1 mmol/L    Comment: NO VISIBLE HEMOLYSIS   Chloride 105 98 - 111 mmol/L   CO2 23 22 - 32 mmol/L   Glucose, Bld 78 70 - 99 mg/dL    Comment: Glucose reference range applies only to samples taken after fasting for at least 8 hours.   BUN 20 6 - 20 mg/dL   Creatinine, Ser 2.42 (H) 0.44 - 1.00 mg/dL   Calcium 8.2 (L) 8.9 - 10.3 mg/dL   GFR, Estimated 26 (L) >60 mL/min    Comment: (NOTE) Calculated using the CKD-EPI Creatinine Equation (2021)    Anion gap 8 5 - 15    Comment: Performed at [facility] [address], [place], [place] [address]  Save Smear     Status: None   Collection Time: 04/17/21  6:09 PM  Result Value Ref Range   Smear Review SMEAR STAINED AND AVAILABLE FOR REVIEW     Comment: Performed at [facility], [place] [address], [place], [place] [address]  CBC     Status: Abnormal   Collection Time: 04/18/21  5:00 AM  Result Value Ref Range   WBC 5.7 4.0 - 10.5 K/uL   RBC 3.16 (L) 3.87 - 5.11 MIL/uL   Hemoglobin 9.5 (L) 12.0 - 15.0 g/dL   HCT 28.7 (L) 36.0 - 46.0 %   MCV 90.8 80.0 - 100.0 fL   MCH 30.1 26.0 - 34.0 pg   MCHC 33.1 30.0 - 36.0 g/dL   RDW 19.8 (H) 11.5 - 15.5 %   Platelets 229 150 - 400 K/uL   nRBC 0.0 0.0 - 0.2 %    Comment: Performed at [facility], [place] [address], [place], [place] [address]  Basic metabolic panel     Status: Abnormal   Collection Time: 04/18/21  5:00 AM  Result Value Ref Range   Sodium 134 (L) 135 - 145 mmol/L   Potassium 3.9 3.5 - 5.1 mmol/L    Comment: DELTA CHECK NOTED   Chloride 105 98 - 111 mmol/L   CO2 20 (L) 22 - 32 mmol/L   Glucose, Bld 82 70 - 99 mg/dL    Comment: Glucose reference range applies only to samples taken after fasting for at least 8 hours.   BUN 26 (H) 6 - 20 mg/dL   Creatinine, Ser 2.55 (H) 0.44 - 1.00  mg/dL   Calcium 7.8 (L) 8.9 - 10.3 mg/dL   GFR, Estimated 25 (L) >60 mL/min    Comment: (NOTE) Calculated using the CKD-EPI Creatinine Equation (2021)    Anion gap 9 5 - 15    Comment: Performed at [facility], [place] [address] [place], [place] [address]    MICRO: 10/30 1 of 4 bottles staph hominis IMAGING: US BIOPSY (KIDNEY)  Result Date: 04/18/2021 INDICATION: Acute kidney injury with concern for nephrotic syndrome. Please perform renal biopsy for tissue diagnostic purposes. EXAM: ULTRASOUND GUIDED RENAL BIOPSY COMPARISON:  Renal ultrasound-04/15/2021; CT abdomen and pelvis-01/22/2021 MEDICATIONS: None. ANESTHESIA/SEDATION: Moderate (conscious) sedation was employed during this procedure as administered by the Interventional Radiology RN. A total of Versed 1 mg and Fentanyl 75 mcg was administered intravenously. Moderate Sedation Time: 14 minutes. The patient's level of consciousness and vital signs  were monitored continuously by radiology nursing throughout the procedure under my direct supervision. COMPLICATIONS: SIR Level A - No therapy, no consequence. Procedure complicated by development of a postprocedural perinephric hematoma. The patient remained asymptomatic during prolonged observation in the interventional radiology department. PROCEDURE: Informed written consent was obtained from the patient after a discussion of the risks, benefits and alternatives to treatment. The patient understands and consents the procedure. A timeout was performed prior to the initiation of the procedure. Ultrasound scanning was performed of the bilateral flanks. The inferior pole of the left kidney was selected for biopsy due to location and sonographic window. The procedure was planned. The operative site was prepped and draped in the usual sterile fashion. The overlying soft tissues were anesthetized with 1% lidocaine with epinephrine. A 17 gauge core needle biopsy device was advanced into the inferior cortex of the left kidney and 3 core biopsies were obtained under direct ultrasound guidance. Images were saved for documentation purposes. The biopsy device was removed and superficial hemostasis was obtained with manual compression. Post procedural scanning demonstrated a moderate-sized though non enlarging and asymptomatic perinephric hematoma. A dressing was applied. The patient otherwise tolerated the procedure well without immediate postprocedural complication and remained asymptomatic during prolonged recovery in the interventional radiology department. IMPRESSION: Technically successful ultrasound guided left renal biopsy. Procedure complicated by development an asymptomatic postprocedural perinephric hematoma. Electronically Signed   By: Sandi Mariscal M.D.   On: 04/18/2021 11:26     Assessment/Plan:  35yo F with recent bioprosthetic TV replacement due to MRSA bacteremia, native valve endocarditis c/b septic  pulmonary emboli/MRSA pna with resp distress requiring intubation, AKI, treated with 6 wk of abtx, but now on chronic suppression with doxycycline since 10/14 - admitted for CHF but found to have staph.hominis in 1 of 4 bottles.   - this likely represents contaminant. Consider repeat blood cx on 11/4 to see if still finding persistent bacteremia - can continue on doxycycline for chronic suppression x 6 months. We will reach out to clinical trial team ato [place] to give update.  Leg wound = recommend mupirocin to wound to see if improve, may be MRSA -Resistant to Tetracycline. In theory, this should have healed from weeks ago. Will do sup cx to see if not MRSA vs. Other zoonotic associated infection like pasteurella.  Chronic hep C = unclear if treated. Recommend to check Hep C and hep C viral load.

## 2021-04-18 NOTE — Progress Notes (Signed)
FPTS Interim Progress Note  S:Patient seen at bedside on nighttime rounds. Sleeping comfortably.  O: BP (!) 143/106 (BP Location: Right Arm)   Pulse 91   Temp 98.2 F (36.8 C) (Oral)   Resp 20   Ht 5\' 9"  (1.753 m)   Wt 73.1 kg   SpO2 95%   BMI 23.80 kg/m   General: Sleeping  Resp: Normal chest rise and fall  A/P: - Remains NPO sips with meds, in anticipation of potential renal biopsy - Continue current management per day team  VSS- BP remains elevated 140s/90s-100s. Labs and orders reviewed. No changes.  Orvis Brill, DO 04/18/2021, 1:31 AM PGY-1, [place] Medicine Service pager [phone]

## 2021-04-18 NOTE — Progress Notes (Signed)
Mobility Specialist Progress Note    04/18/21 1703  Mobility  Activity Ambulated in hall  Level of Assistance Independent after set-up  Assistive Device None  Distance Ambulated (ft) 470 ft  Mobility Ambulated independently in hallway  Mobility Response Tolerated well  Mobility performed by Mobility specialist  $Mobility charge 1 Mobility   Pt received in bed and agreeable. No complaints on walk. Returned to bed with call bell in reach.   Hildred Alamin Mobility Specialist  Mobility Specialist Phone: [phone]

## 2021-04-19 DIAGNOSIS — R0600 Dyspnea, unspecified: Secondary | ICD-10-CM | POA: Diagnosis not present

## 2021-04-19 DIAGNOSIS — R0603 Acute respiratory distress: Secondary | ICD-10-CM | POA: Diagnosis not present

## 2021-04-19 DIAGNOSIS — R319 Hematuria, unspecified: Secondary | ICD-10-CM | POA: Diagnosis not present

## 2021-04-19 DIAGNOSIS — I509 Heart failure, unspecified: Secondary | ICD-10-CM

## 2021-04-19 DIAGNOSIS — J810 Acute pulmonary edema: Secondary | ICD-10-CM | POA: Diagnosis not present

## 2021-04-19 LAB — CBC
HCT: 26.3 % — ABNORMAL LOW (ref 36.0–46.0)
Hemoglobin: 8.8 g/dL — ABNORMAL LOW (ref 12.0–15.0)
MCH: 29.9 pg (ref 26.0–34.0)
MCHC: 33.5 g/dL (ref 30.0–36.0)
MCV: 89.5 fL (ref 80.0–100.0)
Platelets: 239 10*3/uL (ref 150–400)
RBC: 2.94 MIL/uL — ABNORMAL LOW (ref 3.87–5.11)
RDW: 19.3 % — ABNORMAL HIGH (ref 11.5–15.5)
WBC: 6 10*3/uL (ref 4.0–10.5)
nRBC: 0 % (ref 0.0–0.2)

## 2021-04-19 LAB — BASIC METABOLIC PANEL
Anion gap: 9 (ref 5–15)
Anion gap: 8 (ref 5–15)
BUN: 32 mg/dL — ABNORMAL HIGH (ref 6–20)
BUN: 31 mg/dL — ABNORMAL HIGH (ref 6–20)
CO2: 22 mmol/L (ref 22–32)
CO2: 24 mmol/L (ref 22–32)
Calcium: 8.1 mg/dL — ABNORMAL LOW (ref 8.9–10.3)
Calcium: 8 mg/dL — ABNORMAL LOW (ref 8.9–10.3)
Chloride: 103 mmol/L (ref 98–111)
Chloride: 105 mmol/L (ref 98–111)
Creatinine, Ser: 2.53 mg/dL — ABNORMAL HIGH (ref 0.44–1.00)
Creatinine, Ser: 2.61 mg/dL — ABNORMAL HIGH (ref 0.44–1.00)
GFR, Estimated: 25 mL/min — ABNORMAL LOW (ref >60)
GFR, Estimated: 24 mL/min — ABNORMAL LOW (ref >60)
Glucose, Bld: 90 mg/dL (ref 70–99)
Glucose, Bld: 96 mg/dL (ref 70–99)
Potassium: 3.5 mmol/L (ref 3.5–5.1)
Potassium: 3.7 mmol/L (ref 3.5–5.1)
Sodium: 134 mmol/L — ABNORMAL LOW (ref 135–145)
Sodium: 137 mmol/L (ref 135–145)

## 2021-04-19 MED ORDER — FUROSEMIDE 10 MG/ML IJ SOLN
120.0000 mg | Freq: Two times a day (BID) | INTRAVENOUS | Status: DC
  Administered 2021-04-19 (×2): 120 mg via INTRAVENOUS
  Filled 2021-04-19 (×2): qty 10
  Filled 2021-04-19 (×2): qty 12

## 2021-04-19 NOTE — Progress Notes (Signed)
Nephrology Follow-Up Consult note   Assessment/Recommendations: Robyn Shaffer is a/an 35 y.o. 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     Non-Oliguric AKI (stable): Creatinine stable this morning. Likely secondary to nephrotic vs nephrotic syndrome. Differential includes resolving peri-infectious GN vs possibly lupus nephritis vs cryoglobulinemia. Still appears volume up, continue diuresis. -Continue diuresis with Lasix 120 mg q12h -Continue workup for infectious source. Per ID recommend repeat Bcx 11/4 -Follow up cryoglobulin -Follow up renal biopsy results -Continue to monitor daily Cr, Dose meds for GFR -Monitor Daily I/Os, Daily weight  -Maintain MAP>65 for optimal renal perfusion.  -Avoid nephrotoxic medications including NSAIDs and Vanc/Zosyn combo -Currently no indication for HD  Acute HFmrEF Exacerbation: Continue diuresis. Cardiology on board.   Hx of endocarditis and TVR: On Doxycycline x 6 months for chronic suppression. 1 of 4 positive Bcx with Staph hominis. ID consulted and believe this to likely be a contaminant and recommend redraw on 11/4. She has remained afebrile, WBC 6.    Hypertension: Started on Coreg 12.5 mg BID. BP more controlled.   Normocytic Anemia: Hgb stable- 9.5>8.8. Transfuse for Hgb<7 g/dL   H/o opioid use disorder: Clean for the last two months. On Suboxone. TOC provided patient with substance abuse resources for the outpatient setting.    Volume Status: Appears hypervolemic on exam. With 4.7L UOP yesterday, net negative 6L this admission. Weight is down 2.9 kg since admission. Based on our examination and review of available imaging (CXR on 11/2 with small right pleural effusion), our recommendation is continued  diuresis.  [name], PGY-2 Family Medicine Resident 04/19/2021 8:15 AM  ___________________________________________________________  Interval History/Subjective: Robyn Shaffer is feeling well today. She does not have any specific complaints. She feels that the swelling in her legs has gone down considerably, states she used to not be able to bend her legs but now can. She does note a sore on her right leg that occurred since her stay at [facility]. It occurred after biopsy but recently turned into a "sore" about 2 weeks ago. It is not painful.    Medications:  Current Facility-Administered Medications  Medication Dose Route Frequency Provider Last Rate Last Admin   0.9 %  sodium chloride infusion   Intravenous PRN Lenoria Chime, MD   Stopped at 04/16/21 [phone]   acetaminophen (TYLENOL) tablet 650 mg  650 mg Oral Q6H Carollee Leitz, MD   650 mg at 04/18/21 2032   buprenorphine-naloxone (SUBOXONE) 8-2 mg per SL tablet 1 tablet  1 tablet Sublingual Daily Dameron, Marisa, DO   1 tablet at 04/18/21 1214   carvedilol (COREG) tablet 12.5 mg  12.5 mg Oral BID WC [name], MD   12.5 mg at 04/18/21 1744   diclofenac Sodium (VOLTAREN) 1 % topical gel 4 g  4 g Topical QID PRN Lenoria Chime, MD   4 g at 04/16/21 2154   doxycycline (VIBRA-TABS) tablet 100 mg  100 mg Oral Q12H Carollee Leitz, MD   100 mg at 04/18/21 2032   famotidine (PEPCID) tablet 20 mg  20 mg Oral Daily Dameron, Marisa, DO   20 mg at 04/18/21 1214   furosemide (LASIX) 120 mg in dextrose 5 % 50 mL IVPB  120 mg Intravenous BID Kinnie Feil, MD       hydrOXYzine (ATARAX/VISTARIL) tablet 50  mg  50 mg Oral TID Orvis Brill, DO   50 mg at 04/18/21 2032   influenza vac split quadrivalent PF (FLUARIX) injection 0.5 mL  0.5 mL Intramuscular Tomorrow-1000 Pray, Norwood Levo, MD       nicotine (NICODERM CQ - dosed in mg/24 hours) patch 21 mg  21 mg Transdermal Daily Alen Bleacher, MD   21 mg at 04/18/21 1219   QUEtiapine  (SEROQUEL) tablet 50 mg  50 mg Oral QHS Dameron, Luna Fuse, DO   50 mg at 04/18/21 2032      Review of Systems: 10 systems reviewed and negative except per interval history/subjective  Physical Exam: Vitals:   04/18/21 2332 04/19/21 0406  BP: 117/61 (!) 154/99  Pulse: 92 93  Resp: (!) 23 16  Temp: 98.7 F (37.1 C) 98.6 F (37 C)  SpO2: 96% 98%   No intake/output data recorded.  Intake/Output Summary (Last 24 hours) at 04/19/2021 0815 Last data filed at 04/19/2021 0400 Gross per 24 hour  Intake 1311.8 ml  Output 4700 ml  Net -3388.2 ml   Constitutional: well-appearing, pleasant, sitting upright eating breakfast, no acute distress ENMT: ears and nose without scars or lesions, MMM CV: normal rate, trace to 1+ pitting edema b/l shins  Respiratory: normal work of breathing with crackles appreciated to mid-lung b/l, scant expiratory wheezing Gastrointestinal: soft, non-tender, no palpable masses or hernias Skin: Approximately 2 x2 cm wound to inferior medial right shin without surrounding erythema, non-tender, no drainage Psych: alert, judgement/insight appropriate, appropriate mood and affect   Test Results I personally reviewed new and old clinical labs and radiology tests Lab Results  Component Value Date   NA 134 (L) 04/19/2021   K 3.5 04/19/2021   CL 103 04/19/2021   CO2 22 04/19/2021   BUN 32 (H) 04/19/2021   CREATININE 2.53 (H) 04/19/2021   CALCIUM 8.1 (L) 04/19/2021   ALBUMIN 1.9 (L) 04/16/2021

## 2021-04-19 NOTE — Progress Notes (Signed)
FPTS - Holley Bouche Progress note  Spoke with Dr. Ronny Bacon, IR doctor who performed the kidney biopsy, about perinephric hematoma and restarting anticoagulation. He states that they would ideally like to hold off, but would be willing to start if it was necessary. He proposed holding off on anticoagulation today, and considering starting it tomorrow. Ideally we would run a heparin drip for 24 hours and watch the HCT. If the HCT is fine, then we can start warfarin. He notes that there is no need to re-image the kidneys as it wouldn't change management. He said if the hematoma was worsening they'd consider doing an arterial embolization, something he'd like to avoid in a patient with renal insufficiency. He also appreciates that because she has no signs or symptoms of worsening hematoma, that there is no need for imaging (HCT with slight drop from 28.7 to 26.3, negative flank pain or biopsy sight changes).  -Holley Bouche, MD  PGY1 FPTS

## 2021-04-19 NOTE — Progress Notes (Signed)
Mobility Specialist Progress Note    04/19/21 1529  Mobility  Activity Ambulated in hall  Level of Assistance Independent  Assistive Device None  Distance Ambulated (ft) 470 ft  Mobility Ambulated independently in hallway  Mobility Response Tolerated well  Mobility performed by Mobility specialist  $Mobility charge 1 Mobility   Pt received in bed and agreeable. No complaints on walk. Feeling nervous about the outcome of her procedure. Returned to bed with call bell in reach.   Hildred Alamin Mobility Specialist  Mobility Specialist Phone: [phone]

## 2021-04-19 NOTE — Progress Notes (Addendum)
Chart reviewed. Diuresing well. Will Sign off.

## 2021-04-19 NOTE — Hospital Course (Addendum)
Robyn Shaffer is a 35 y.o. female admitted for acute hypoxic respiratory failure 2/2 CHF exacerbation, frank hematuria s/p renal biopsy, and AKI in the setting of CKD stage IIIb.  PMH is significant for MRSA endocarditis and bacteremia s/p Tricuspid valve replacement, septic pulmonary emboli, nephrotic syndrome, and IVDU currently on Suboxone.   Acute hypoxic respiratory failure secondary to acute CHF exacerbation Patient was admitted for 1 day history of dyspnea, worse with laying supine. On arrival to ED she was tachycardic, tachypneic, and hypertensive. Echo from [facility] admission, 02/26/21, showed EF 62.6%. CXR showed cardiomegaly and small pleural effusion. BNP was greater that 4,500 and troponin was slightly elevated to 34. On exam patient had diffuse crackles, intermittent wheezing, abdominal distress, 2+ BLE edema and no JVD. Echo this admission showed LVEF of 45-50%. Patient was treated with Lasix until fluid status was euvolemic. Patient weight was 71.2 kg on admission and was 61.5 kg by discharge. By discharge patient respiratory status was improved.    Hx of Endocarditis s/p bioprosthetic Tricuspid valve replacement Patient had Tricuspid valve replacement (02/13/21) for endocarditis with MRSA bacteremia at [facility] where she was admitted from 02/05/21 - 916/22. Hospital course was complicated by pleural effusion, pneumothorax, and multiple septic emboli in bilateral lungs. On discharge she was supposed to take warfarin for 3 months (through the end of November 2022), but she had run out of warfarin days before admission. During admission, anticoagulation was started, but discontinued because of decreasing hemoglobin, hematuria, and perinephric hematoma. Patient also taking Doxycycline 100 mg BID, started 03/30/21 due to last 6 months (09/28/20).   Hematuria   AKI  Nephrotic Kidney Disease  CKD IIIb On admission patients Cr was 2.11 and GFR of 32. Cr 2 months prior was 1.43. Hematuria began around  September, is painless and not associated with menses. Patient was supposed to receive renal biopsy at [facility], but left AMA. 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 Patient also developed perinephric hematoma from the renal biopsy. On repeat Renal U/S, perinephric hematoma not present, so IR consulted. IR saw no acute signs of instability, so advised to continue to monitor.   Anemia Hgb on admission was 9.0, with a transfusion threshold of 8. Patient's hemoglobin dropped to 7.7, and nephrology gave a dosage of Aranesp 40 mcg. Repeat Hgb was 7.3, and patient transfused 1 unit pRBC on 11/8. Repeat Hgb on day of discharge 9.5.   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 She reports she's been clean for 2 months. UDS was positive for amphetamines. Patient remained on Suboxone during admission, and will continue upon discharge.  Hypertension  On Carvedilol 12.5 mg BID, Amlodipine 10 mg. Per nephrology, hydralazine d/c and Lasix 40 mg PO three times a week for HTN and edema.    Issues for PCP f/u  Patient was switched from metoprolol to Carvedilol, please confirm she is still taking this.  Follow-up on Hgb/HCT. Re-evaluate to consider restarting anticoagulation. Continue doxycycline until 09/28/2021, 6 months total treatment regimen. Will ensure she has follow-up with [place] [facility] - [name].

## 2021-04-19 NOTE — Progress Notes (Signed)
[facility] for Infectious Disease    Date of Admission:  04/15/2021      ID: Robyn Shaffer is a 35 y.o. female with prolonged MRSA bacteremia with TVE s/p TV replacement, on OPTIMAL trial, to received doxy x 6 months starting 03/30/21 Active Problems:   Acute CHF (HCC)    Subjective: Afebrile, less swelling of legs. No drainage from right leg wound  Medications:   acetaminophen  650 mg Oral Q6H   buprenorphine-naloxone  1 tablet Sublingual Daily   carvedilol  12.5 mg Oral BID WC   doxycycline  100 mg Oral Q12H   famotidine  20 mg Oral Daily   hydrOXYzine  50 mg Oral TID   influenza vac split quadrivalent PF  0.5 mL Intramuscular Tomorrow-1000   nicotine  21 mg Transdermal Daily   QUEtiapine  50 mg Oral QHS    Objective: Vital signs in last 24 hours: Temp:  [98 F (36.7 C)-98.7 F (37.1 C)] 98.4 F (36.9 C) (11/03 1531) Pulse Rate:  [88-95] 90 (11/03 1531) Resp:  [15-23] 18 (11/03 1531) BP: (117-154)/(61-99) 141/91 (11/03 1531) SpO2:  [96 %-100 %] 99 % (11/03 1531) Weight:  [68.3 kg] 68.3 kg (11/03 0406) Physical Exam  Constitutional:  oriented to person, place, and time. appears well-developed and well-nourished. No distress.  HENT: [place]/AT, PERRLA, no scleral icterus Mouth/Throat: Oropharynx is clear and moist. No oropharyngeal exudate.  Cardiovascular: Normal rate, regular rhythm and normal heart sounds. Exam reveals no gallop and no friction rub.  No murmur heard.  Pulmonary/Chest: Effort normal and breath sounds normal. No respiratory distress.  has no wheezes.  Neck = supple, no nuchal rigidity Abdominal: Soft. Bowel sounds are normal.  exhibits no distension. There is no tenderness.  Lymphadenopathy: no cervical adenopathy. No axillary adenopathy Neurological: alert and oriented to person, place, and time.  Skin: Skin is warm and dry. No rash noted. No erythema.  Psychiatric: a normal mood and affect.  behavior is normal.   Lab Results Recent Labs     04/18/21 0500 04/18/21 1634 04/19/21 0545  WBC 5.7  --  6.0  HGB 9.5*  --  8.8*  HCT 28.7*  --  26.3*  NA 134* 136 134*  K 3.9 4.5 3.5  CL 105 104 103  CO2 20* 22 22  BUN 26* 26* 32*  CREATININE 2.55* 2.52* 2.53*   Liver Panel  Microbiology: reviewed Studies/Results: DG Chest 2 View  Result Date: 04/18/2021 CLINICAL DATA:  Productive cough x1 week. EXAM: CHEST - 2 VIEW COMPARISON:  April 15, 2021 FINDINGS: Multiple sternal wires are seen. Mild, stable increased lung markings are noted bilaterally. Mild atelectasis and/or infiltrate is seen within the right lung base. This is decreased in severity when compared to the prior study. A small right pleural effusion is present. This is also very mildly decreased in size. No pneumothorax is identified. The heart size and mediastinal contours are within normal limits. The visualized skeletal structures are unremarkable. IMPRESSION: 1. Mild right basilar atelectasis and/or infiltrate, decreased in size when compared to the prior study. 2. Small right pleural effusion. Electronically Signed   By: Virgina Norfolk M.D.   On: 04/18/2021 20:09   US BIOPSY (KIDNEY)  Result Date: 04/18/2021 INDICATION: Acute kidney injury with concern for nephrotic syndrome. Please perform renal biopsy for tissue diagnostic purposes. EXAM: ULTRASOUND GUIDED RENAL BIOPSY COMPARISON:  Renal ultrasound-04/15/2021; CT abdomen and pelvis-01/22/2021 MEDICATIONS: None. ANESTHESIA/SEDATION: Moderate (conscious) sedation was employed during this procedure as administered  by the Interventional Radiology RN. A total of Versed 1 mg and Fentanyl 75 mcg was administered intravenously. Moderate Sedation Time: 14 minutes. The patient's level of consciousness and vital signs were monitored continuously by radiology nursing throughout the procedure under my direct supervision. COMPLICATIONS: SIR Level A - No therapy, no consequence. Procedure complicated by development of a  postprocedural perinephric hematoma. The patient remained asymptomatic during prolonged observation in the interventional radiology department. PROCEDURE: Informed written consent was obtained from the patient after a discussion of the risks, benefits and alternatives to treatment. The patient understands and consents the procedure. A timeout was performed prior to the initiation of the procedure. Ultrasound scanning was performed of the bilateral flanks. The inferior pole of the left kidney was selected for biopsy due to location and sonographic window. The procedure was planned. The operative site was prepped and draped in the usual sterile fashion. The overlying soft tissues were anesthetized with 1% lidocaine with epinephrine. A 17 gauge core needle biopsy device was advanced into the inferior cortex of the left kidney and 3 core biopsies were obtained under direct ultrasound guidance. Images were saved for documentation purposes. The biopsy device was removed and superficial hemostasis was obtained with manual compression. Post procedural scanning demonstrated a moderate-sized though non enlarging and asymptomatic perinephric hematoma. A dressing was applied. The patient otherwise tolerated the procedure well without immediate postprocedural complication and remained asymptomatic during prolonged recovery in the interventional radiology department. IMPRESSION: Technically successful ultrasound guided left renal biopsy. Procedure complicated by development an asymptomatic postprocedural perinephric hematoma. Electronically Signed   By: Sandi Mariscal M.D.   On: 04/18/2021 11:26     Assessment/Plan: Hx of MRSA TV endocarditis = continue on doxycycline through mid April per clinical trial. No new signs of infective PVE  Right leg wound = cx to see if she has doxy resistant MRSA. Appears slow healing. Continue to wash with soap and water, local wound care. Can do daily mupirocin ointment.    [facility] Pager: [phone]  04/19/2021, 4:44 PM

## 2021-04-19 NOTE — Progress Notes (Signed)
FPTS Brief Progress Note  S:Patient asleep. Discussed with RN. No concerns at this time.    O: BP 117/61 (BP Location: Right Arm)   Pulse 92   Temp 98.7 F (37.1 C) (Oral)   Resp (!) 23   Ht 5\' 9"  (1.753 m)   Wt 70.2 kg   SpO2 96%   BMI 22.86 kg/m     A/P:  Acute CHF exacerbation  AKI on CKD3b Overnight output currently recorded at 1,500 mL. Awaiting morning weight and labs. No change to plan from day team.   - Orders reviewed. Labs for AM ordered, which was adjusted as needed.    Ezequiel Essex, MD 04/19/2021, 3:14 AM PGY-2, [place] [facility] Night Resident  Please page [phone] with questions.

## 2021-04-19 NOTE — Progress Notes (Signed)
Family Medicine Teaching Service Daily Progress Note Intern Pager: [phone]  Patient name: Robyn Shaffer Medical record number: [id number] Date of birth: 05/04/1986 Age: 35 y.o. Gender: female  Primary Care Provider: Patient, No Pcp Per (Inactive) Consultants: Nephrology, ID, Cardiology, IR Code Status: Full  Pt Overview and Major Events to Date:  10/30 - admitted 11/2 - Lft Kidney US Biopsy  Assessment and Plan:  Sathvika B. Pickerill is a 35 year old female who was admitted for shortness of breath and frank hematuria.  PMH is significant for endocarditis s/p tricuspid valve replacement, anemia, IV drug use, and polysubstance abuse.   Acute CHF exacerbation Patient weight 68.3 kg today, from 70.2 kg yesterday, patient output 4.7 L yesterday.  Patient complaining of productive cough, obtained CXR concerning for atelectasis or infiltrate, patient without systemic symptoms at this point. Less concerned for PNA. Symptoms likely 2/2 CHF.  -Daily Weights -Daily I/O's -Continue Lasix 120 BID -Appreciate cardiology recs -Continue Doxycycline 100 mg every q 12 hours for 6 months (10/14-) -BMP BID -Daily CBC -CCM   CKD IIIb  AKI  nephrotic kidney disease  Hematuria  HTN Patient had kidney biopsy yesterday. Awaiting results of biopsy to discern kidney pathology. At this time she has overlapping concern for nephritic/nephrotic kidney disease. Patient developed perinephritic hematoma after procedure, will follow up with IR about reassessment. Cr today 2.53 (2.52, 2.55) -F/u with IR about reassessing hematoma and starting anticoagulation  Anemia Transfusion threshold less than 7, Hgb 8.8 today (9.5, 8.7).  Polysubstance abuse History of alcohol abuse, COWS 1 today  FEN/GI: Heart Healthy Diet PPx: Holding prophylactic due to increased bleeding risk Dispo: Home pending clinical improvement     Subjective:  Patient has no complaints today and is having improved respiratory status. Patient  also less volume overloaded on exam today, notable only minimally in hips and almost absent in lower extremities.  Objective: Temp:  [97.6 F (36.4 C)-98.7 F (37.1 C)] 98.6 F (37 C) (11/03 0406) Pulse Rate:  [86-101] 93 (11/03 0406) Resp:  [15-23] 16 (11/03 0406) BP: (117-166)/(61-116) 154/99 (11/03 0406) SpO2:  [95 %-100 %] 98 % (11/03 0406) Weight:  [68.3 kg] 68.3 kg (11/03 0406) Physical Exam: General: Alert, cooperative, good affect, no acute distress, white woman Cardiovascular: RRR, [facility] Respiratory: Coarse breath sounds diffusely with crackles in lower lung fields. No wheezes Abdomen: Soft, NTTP, Non-distended Extremities: Pulses intact in all extremities, cap refill < 2 sec  Laboratory: Recent Labs  Lab 04/16/21 0217 04/17/21 0254 04/18/21 0500  WBC 5.6 4.8 5.7  HGB 8.0* 8.7* 9.5*  HCT 25.2* 27.7* 28.7*  PLT 144* 172 229   Recent Labs  Lab 04/15/21 1027 04/15/21 1031 04/16/21 0217 04/16/21 0718 04/17/21 1809 04/18/21 0500 04/18/21 1634  NA 136   < > 136   < > 136 134* 136  K 3.7   < > 3.5   < > 4.7 3.9 4.5  CL 107   < > 106   < > 105 105 104  CO2 19*   < > 21*   < > 23 20* 22  BUN 11   < > 14   < > 20 26* 26*  CREATININE 2.11*   < > 2.23*   < > 2.42* 2.55* 2.52*  CALCIUM 8.0*   < > 7.6*   < > 8.2* 7.8* 8.2*  PROT 7.0  --  5.5*  --   --   --   --   BILITOT 0.8  --  0.7  --   --   --   --  ALKPHOS 89  --  74  --   --   --   --   ALT 31  --  25  --   --   --   --   AST 40  --  31  --   --   --   --   GLUCOSE 107*   < > 112*   < > 78 82 74   < > = values in this interval not displayed.     Imaging/Diagnostic Tests:   Holley Bouche, MD 04/19/2021, 5:05 AM PGY-1, [place] Intern pager: [phone], text pages welcome

## 2021-04-19 NOTE — Progress Notes (Signed)
FPTS Brief Progress Note  S:Patient sleeping soundly in bed. I did not awake patient.    O: BP 130/86 (BP Location: Right Arm)   Pulse 78   Temp 98.1 F (36.7 C) (Oral)   Resp 13   Ht 5\' 9"  (1.753 m)   Wt 68.3 kg   SpO2 98%   BMI 22.24 kg/m     A/P:  Acute CHF exacerbation Daily weight this AM 68.3 kg, down from previous day. Will await AM weight tomorrow. Current I/Os indicate 1,000 mL urine output thus far tonight, will continue to monitor. Pm Bmp stable compared to AM. Of note, patient is persistently hypocalcemic. Will check CMP tomorrow morning so we may calculate correct calcium and act accordingly. No changes to plan from day team.   Hx ETOH and Polysubstance abuse Day time COWS of 0. Will continue to monitor.     - Orders reviewed. Labs for AM ordered, which was adjusted as needed.  - If condition changes, plan includes consulting nephro.   Ezequiel Essex, MD 04/19/2021, 11:18 PM PGY-2, [place] Night Resident  Please page [phone] with questions.

## 2021-04-19 NOTE — Plan of Care (Signed)
  Problem: Health Behavior/Discharge Planning: Goal: Ability to manage health-related needs will improve 04/19/2021 2152 by Kaylyn Lim, RN Outcome: Progressing 04/19/2021 2152 by Kaylyn Lim, RN Outcome: Progressing   Problem: Clinical Measurements: Goal: Ability to maintain clinical measurements within normal limits will improve 04/19/2021 2152 by Kaylyn Lim, RN Outcome: Progressing 04/19/2021 2152 by Kaylyn Lim, RN Outcome: Progressing   Problem: Clinical Measurements: Goal: Diagnostic test results will improve 04/19/2021 2152 by Kaylyn Lim, RN Outcome: Progressing   Problem: Clinical Measurements: Goal: Respiratory complications will improve 04/19/2021 2152 by Kaylyn Lim, RN Outcome: Progressing 04/19/2021 2152 by Kaylyn Lim, RN Outcome: Progressing   Problem: Clinical Measurements: Goal: Cardiovascular complication will be avoided 04/19/2021 2152 by Kaylyn Lim, RN Outcome: Progressing   Problem: Coping: Goal: Level of anxiety will decrease 04/19/2021 2152 by Kaylyn Lim, RN Outcome: Progressing 04/19/2021 2152 by Kaylyn Lim, RN Outcome: Progressing   Problem: Safety: Goal: Ability to remain free from injury will improve 04/19/2021 2152 by Kaylyn Lim, RN Outcome: Progressing 04/19/2021 2152 by Kaylyn Lim, RN Outcome: Progressing   Problem: Skin Integrity: Goal: Risk for impaired skin integrity will decrease 04/19/2021 2152 by Kaylyn Lim, RN Outcome: Progressing 04/19/2021 2152 by Kaylyn Lim, RN Outcome: Progressing

## 2021-04-20 DIAGNOSIS — J810 Acute pulmonary edema: Secondary | ICD-10-CM | POA: Diagnosis not present

## 2021-04-20 DIAGNOSIS — R319 Hematuria, unspecified: Secondary | ICD-10-CM | POA: Diagnosis not present

## 2021-04-20 DIAGNOSIS — R0600 Dyspnea, unspecified: Secondary | ICD-10-CM | POA: Diagnosis not present

## 2021-04-20 DIAGNOSIS — R0603 Acute respiratory distress: Secondary | ICD-10-CM | POA: Diagnosis not present

## 2021-04-20 LAB — COMPREHENSIVE METABOLIC PANEL
ALT: 19 U/L (ref 0–44)
AST: 38 U/L (ref 15–41)
Albumin: 2 g/dL — ABNORMAL LOW (ref 3.5–5.0)
Alkaline Phosphatase: 73 U/L (ref 38–126)
Anion gap: 10 (ref 5–15)
BUN: 34 mg/dL — ABNORMAL HIGH (ref 6–20)
CO2: 23 mmol/L (ref 22–32)
Calcium: 8.2 mg/dL — ABNORMAL LOW (ref 8.9–10.3)
Chloride: 104 mmol/L (ref 98–111)
Creatinine, Ser: 2.66 mg/dL — ABNORMAL HIGH (ref 0.44–1.00)
GFR, Estimated: 23 mL/min — ABNORMAL LOW (ref >60)
Glucose, Bld: 95 mg/dL (ref 70–99)
Potassium: 3.5 mmol/L (ref 3.5–5.1)
Sodium: 137 mmol/L (ref 135–145)
Total Bilirubin: 0.6 mg/dL (ref 0.3–1.2)
Total Protein: 5.6 g/dL — ABNORMAL LOW (ref 6.5–8.1)

## 2021-04-20 LAB — CBC
HCT: 26.5 % — ABNORMAL LOW (ref 36.0–46.0)
Hemoglobin: 8.9 g/dL — ABNORMAL LOW (ref 12.0–15.0)
MCH: 30.2 pg (ref 26.0–34.0)
MCHC: 33.6 g/dL (ref 30.0–36.0)
MCV: 89.8 fL (ref 80.0–100.0)
Platelets: 235 10*3/uL (ref 150–400)
RBC: 2.95 MIL/uL — ABNORMAL LOW (ref 3.87–5.11)
RDW: 18.9 % — ABNORMAL HIGH (ref 11.5–15.5)
WBC: 5.1 10*3/uL (ref 4.0–10.5)
nRBC: 0 % (ref 0.0–0.2)

## 2021-04-20 LAB — HEMOGLOBIN A1C
Hgb A1c MFr Bld: 4.5 % — ABNORMAL LOW (ref 4.8–5.6)
Mean Plasma Glucose: 82.45 mg/dL

## 2021-04-20 LAB — CULTURE, BLOOD (ROUTINE X 2): Culture: NO GROWTH

## 2021-04-20 LAB — TSH: TSH: 4.079 u[IU]/mL (ref 0.350–4.500)

## 2021-04-20 LAB — MAGNESIUM
Magnesium: 1.3 mg/dL — ABNORMAL LOW (ref 1.7–2.4)
Magnesium: 1.8 mg/dL (ref 1.7–2.4)

## 2021-04-20 LAB — HEPATITIS C GENOTYPE

## 2021-04-20 MED ORDER — NYSTATIN 100000 UNIT/GM EX CREA
TOPICAL_CREAM | Freq: Two times a day (BID) | CUTANEOUS | Status: DC
  Administered 2021-04-22 – 2021-04-24 (×2): 1 via TOPICAL
  Filled 2021-04-20 (×2): qty 15

## 2021-04-20 MED ORDER — MAGNESIUM SULFATE 2 GM/50ML IV SOLN
2.0000 g | Freq: Once | INTRAVENOUS | Status: AC
  Administered 2021-04-20: 2 g via INTRAVENOUS
  Filled 2021-04-20: qty 50

## 2021-04-20 MED ORDER — AMLODIPINE BESYLATE 5 MG PO TABS
5.0000 mg | ORAL_TABLET | Freq: Every day | ORAL | Status: DC
  Administered 2021-04-20 – 2021-04-23 (×4): 5 mg via ORAL
  Filled 2021-04-20 (×4): qty 1

## 2021-04-20 NOTE — Progress Notes (Signed)
Nephrology Follow-Up Consult note   Assessment/Recommendations: Robyn Shaffer is a/an 35 y.o. 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      Non-Oliguric AKI (stable): Creatinine stable this morning, 2.61>2.66. Likely secondary to nephrotic vs nephrotic syndrome vs combination. Differential includes resolving peri-infectious GN vs possibly lupus nephritis vs cryoglobulinemia. M spike present, immunofixation is still pending. Has been diuresing well, UOP 3.7L yesterday and neg negative 9.2L this admission.  -Hold diuresis -Follow up cryoglobulin, immunofixation -Follow up renal biopsy results -Continue to monitor daily Cr, Dose meds for GFR -Monitor Daily I/Os, Daily weight  -Maintain MAP>65 for optimal renal perfusion.  -Avoid nephrotoxic medications including NSAIDs -Currently no indication for HD  Hypomagnesemia: Mag 1.3. Replete.    Acute HFmrEF Exacerbation: Has diuresed well, cardiology has signed off.   Hx of endocarditis and TVR: On Doxycycline x 6 months for chronic suppression. 1 of 4 positive Bcx with Staph hominis. ID consulted and believe this to likely be a contaminant and recommend redraw on 11/4. She has remained afebrile, WBC 5.    Hypertension: Started on Coreg 12.5 mg BID. BP still hypertensive to 157/106 this AM. Consider starting additional regimen such as CCB.    Normocytic Anemia: Hgb stable- 9.5>8.8. Transfuse for Hgb<7 g/dL   H/o opioid use disorder: Clean for the last two months. On Suboxone. TOC provided patient with substance abuse resources for the outpatient setting.    [name], PGY-2 Family Medicine Resident  04/20/2021 8:19 AM  ___________________________________________________________  Interval  History/Subjective: Robyn Shaffer feels well this morning. She states that her urine is "not as bloody" as before but more of a "beige" color. She has no specific complaints. Feels that swelling is still improved.     Medications:  Current Facility-Administered Medications  Medication Dose Route Frequency Provider Last Rate Last Admin   0.9 %  sodium chloride infusion   Intravenous PRN Lenoria Chime, MD   Stopped at 04/16/21 [phone]   acetaminophen (TYLENOL) tablet 650 mg  650 mg Oral Q6H Carollee Leitz, MD   650 mg at 04/19/21 2057   amLODipine (NORVASC) tablet 5 mg  5 mg Oral Daily [name], MD       buprenorphine-naloxone (SUBOXONE) 8-2 mg per SL tablet 1 tablet  1 tablet Sublingual Daily Dameron, Marisa, DO   1 tablet at 04/19/21 1023   carvedilol (COREG) tablet 12.5 mg  12.5 mg Oral BID WC [name], MD   12.5 mg at 04/20/21 8250   diclofenac Sodium (VOLTAREN) 1 % topical gel 4 g  4 g Topical QID PRN Lenoria Chime, MD   4 g at 04/16/21 2154   doxycycline (VIBRA-TABS) tablet 100 mg  100 mg Oral Q12H Carollee Leitz, MD   100 mg at 04/19/21 2057   famotidine (PEPCID) tablet 20 mg  20 mg Oral Daily Dameron, Marisa, DO   20 mg at 04/19/21 1023   hydrOXYzine (ATARAX/VISTARIL) tablet 50 mg  50 mg Oral TID Orvis Brill, DO   50 mg at 04/19/21 2057   influenza vac split quadrivalent PF (FLUARIX) injection 0.5 mL  0.5 mL Intramuscular Tomorrow-1000 Lenoria Chime, MD       magnesium sulfate IVPB 2 g 50 mL  2 g Intravenous Once Orvis Brill, DO 50 mL/hr at 04/20/21 0745 2 g at 04/20/21 0745  nicotine (NICODERM CQ - dosed in mg/24 hours) patch 21 mg  21 mg Transdermal Daily Alen Bleacher, MD   21 mg at 04/19/21 1023   QUEtiapine (SEROQUEL) tablet 50 mg  50 mg Oral QHS Dameron, Luna Fuse, DO   50 mg at 04/19/21 2057      Review of Systems: 10 systems reviewed and negative except per interval history/subjective  Physical Exam: Vitals:   04/20/21 0404 04/20/21 0732  BP: (!)  140/95 (!) 157/106  Pulse: 86 92  Resp: 13 20  Temp: 98.1 F (36.7 C) 98.1 F (36.7 C)  SpO2: 98% 98%   No intake/output data recorded.  Intake/Output Summary (Last 24 hours) at 04/20/2021 0819 Last data filed at 04/20/2021 0700 Gross per 24 hour  Intake 421.67 ml  Output 3700 ml  Net -3278.33 ml   Constitutional: well-appearing, pleasant, sitting upright eating breakfast, no acute distress ENMT: ears and nose without scars or lesions, MMM CV: normal rate, trace to 1+ pitting edema b/l shins  Respiratory: normal work of breathing with coarse crackles b/l to mid-lung Gastrointestinal: soft, non-tender, no rebound/guarding, normoactive bowel sounds Skin: Approximately 2 x2 cm wound to inferior medial right shin without surrounding erythema, non-tender, no drainage Psych: alert, judgement/insight appropriate, appropriate mood and affect   Test Results I personally reviewed new and old clinical labs and radiology tests Lab Results  Component Value Date   NA 137 04/20/2021   K 3.5 04/20/2021   CL 104 04/20/2021   CO2 23 04/20/2021   BUN 34 (H) 04/20/2021   CREATININE 2.66 (H) 04/20/2021   CALCIUM 8.2 (L) 04/20/2021   ALBUMIN 2.0 (L) 04/20/2021

## 2021-04-20 NOTE — Progress Notes (Signed)
Mobility Specialist Progress Note    04/20/21 1156  Mobility  Activity Ambulated in hall  Level of Assistance Independent  Assistive Device None  Distance Ambulated (ft) 450 ft  Mobility Ambulated independently in hallway  Mobility Response Tolerated well  Mobility performed by Mobility specialist  $Mobility charge 1 Mobility   Pt received in bed and agreeable. No complaints on walk. Returned to sitting EOB with call bell in reach.   Hildred Alamin Mobility Specialist  Mobility Specialist Phone: [phone]

## 2021-04-20 NOTE — Progress Notes (Signed)
Family Medicine Teaching Service Daily Progress Note Intern Pager: [phone]  Patient name: Robyn Shaffer Medical record number: [id number] Date of birth: Jul 10, 1985 Age: 35 y.o. Gender: female  Primary Care Provider: Patient, No Pcp Per (Inactive) Consultants: Nephrology, ID Code Status: Full  Pt Overview and Major Events to Date:  10/30 - admitted 11/2 - Lft Kidney US Biopsy - IR  Assessment and Plan:  Kerry-Anne B. Gertz is a 35 year old female who was admitted for shortness of breath and frank hematuria.  PMH is significant for endocarditis s/p tricuspid valve replacement, anemia, IV drug use, and polysubstance abuse.   Acute CHF exacerbation Her weight is 65 kg, down 3.8 kg today, with 2. 7 L output. Her volume status is... Her respiratory status is... Her exam... No growth in wound Cx. -Hold diuretics per nephro -Consider starting heparin for anticoagulation tomorrow -Nephrology following appreciate recs -Daily Weights -Daily I/O -Continue lasix 120 BID -Continue Doxycycline 100 mg q 12 Hr for 6 months (10/14) -BMP daily -Daily CBC -CCM  CKD IIIb  AKI  nephrotic kidney disease  Hematuria   Awaiting results of kidney biopsy and serologies. Cr today slightly up at 2.66 from 2.61. She continues to have blood in urine. Mg was 1.3 today and was repleted over night. -F/u cryoglobulin, renal biopsy -Mg 1.3 today replete with 2g mg  Anemia Hgb stable at 8.9 today. Transfusion threshold < 7  Polysubstance abuse COWS 0 today, History of alcohol abuse   FEN/GI: Heart Healthy PPx: Holding prophylactic due to increased bleeding risk Dispo:Home pending clinical improvement .   Subjective:  Feels well today, no complaints, still has light cough but is improved.   Objective: Temp:  [98.1 F (36.7 C)-98.4 F (36.9 C)] 98.1 F (36.7 C) (11/04 0404) Pulse Rate:  [78-90] 78 (11/03 2018) Resp:  [12-18] 13 (11/04 0404) BP: (130-144)/(86-95) 140/95 (11/04 0404) SpO2:  [98 %-99  %] 98 % (11/04 0028) Weight:  [65 kg] 65 kg (11/04 0404) Physical Exam: General: Polite, well appearing, white woman, good affect Cardiovascular: RRR, NRMG Respiratory: Diffuses crackles in lower lung fields Abdomen: Soft, NTTP, non-distended, no fluid wave Extremities: Pulses intact in all extremities, cap refill < 2 sec  Laboratory: Recent Labs  Lab 04/18/21 0500 04/19/21 0545 04/20/21 0414  WBC 5.7 6.0 5.1  HGB 9.5* 8.8* 8.9*  HCT 28.7* 26.3* 26.5*  PLT 229 239 235   Recent Labs  Lab 04/15/21 1027 04/15/21 1031 04/16/21 0217 04/16/21 0718 04/19/21 0545 04/19/21 1635 04/20/21 0414  NA 136   < > 136   < > 134* 137 137  K 3.7   < > 3.5   < > 3.5 3.7 3.5  CL 107   < > 106   < > 103 105 104  CO2 19*   < > 21*   < > 22 24 23   BUN 11   < > 14   < > 32* 31* 34*  CREATININE 2.11*   < > 2.23*   < > 2.53* 2.61* 2.66*  CALCIUM 8.0*   < > 7.6*   < > 8.1* 8.0* 8.2*  PROT 7.0  --  5.5*  --   --   --  5.6*  BILITOT 0.8  --  0.7  --   --   --  0.6  ALKPHOS 89  --  74  --   --   --  73  ALT 31  --  25  --   --   --  19  AST 40  --  31  --   --   --  38  GLUCOSE 107*   < > 112*   < > 90 96 95   < > = values in this interval not displayed.      Imaging/Diagnostic Tests:   Holley Bouche, MD 04/20/2021, 5:46 AM PGY-1, [place] Intern pager: [phone], text pages welcome

## 2021-04-20 NOTE — Care Management (Signed)
Case Manager received a consult for medication assistance. Patient has [facility] in [phone]. Patient states she will be able to use her [facility] Medicaid to get her medications at the above pharmacy. An appointment was made at the [facility]- patient states she will cancel if she cannot make the appointment. No further needs from Case Manager.

## 2021-04-20 NOTE — Progress Notes (Addendum)
FPTS Interim Progress Note  S:Patient seen at bedside on nighttime rounds. Only complaint is itchy/irritated inguinal folds that she thinks is yeast due to antibiotic use. No other complaints.  O: BP (!) 137/92 (BP Location: Right Arm)   Pulse 82   Temp 98.5 F (36.9 C) (Oral)   Resp 17   Ht 5\' 9"  (1.753 m)   Wt 65 kg   SpO2 99%   BMI 21.18 kg/m   General: Resting comfortably in phone on bed Resp: Normal work of breathing on room air Skin: Inguinal folds erythematous, pruritic with small raised bumps consistent with candidal infection SCDs on and working on bilateral legs  A/P: Acute CHF Exacerbation s/p tricuspid valve replacement Holding diuretic per nephro. No volume overload on exam - Consider starting Heparin tomorrow for [facility] - Continue doxycycline for chronic suppression - Monitor BMP  Non-oliguric AKI Holding diuresis, as iterated above. Will monitor UOP.   HTN Coreg 12.5mg  BID started. Most recent BP 137/92. - Monitor VS - Consider additional regimen like CCB if persistent HTN  Candidal skin infection Bilateral inguinal groins. Pt states areas are itchy and she thinks due to antbiotic use.  - Start topical nystatin BID  Labs and orders reviewed.   VSS.   VTE PPX: SCDs on legs bilaterally.  Orvis Brill, DO 04/20/2021, 11:07 PM PGY-1, [place] Medicine Service pager [phone]

## 2021-04-21 ENCOUNTER — Encounter (HOSPITAL_COMMUNITY): Payer: Self-pay | Admitting: Student

## 2021-04-21 DIAGNOSIS — R0600 Dyspnea, unspecified: Secondary | ICD-10-CM | POA: Diagnosis not present

## 2021-04-21 DIAGNOSIS — J810 Acute pulmonary edema: Secondary | ICD-10-CM | POA: Diagnosis not present

## 2021-04-21 DIAGNOSIS — R0603 Acute respiratory distress: Secondary | ICD-10-CM | POA: Diagnosis not present

## 2021-04-21 DIAGNOSIS — R319 Hematuria, unspecified: Secondary | ICD-10-CM | POA: Diagnosis not present

## 2021-04-21 LAB — HEPARIN LEVEL (UNFRACTIONATED): Heparin Unfractionated: <0.1 IU/mL — ABNORMAL LOW (ref 0.30–0.70)

## 2021-04-21 LAB — COMPREHENSIVE METABOLIC PANEL
ALT: 19 U/L (ref 0–44)
AST: 44 U/L — ABNORMAL HIGH (ref 15–41)
Albumin: 2 g/dL — ABNORMAL LOW (ref 3.5–5.0)
Alkaline Phosphatase: 75 U/L (ref 38–126)
Anion gap: 9 (ref 5–15)
BUN: 34 mg/dL — ABNORMAL HIGH (ref 6–20)
CO2: 21 mmol/L — ABNORMAL LOW (ref 22–32)
Calcium: 8.2 mg/dL — ABNORMAL LOW (ref 8.9–10.3)
Chloride: 106 mmol/L (ref 98–111)
Creatinine, Ser: 2.5 mg/dL — ABNORMAL HIGH (ref 0.44–1.00)
GFR, Estimated: 25 mL/min — ABNORMAL LOW (ref >60)
Glucose, Bld: 85 mg/dL (ref 70–99)
Potassium: 3.9 mmol/L (ref 3.5–5.1)
Sodium: 136 mmol/L (ref 135–145)
Total Bilirubin: 0.4 mg/dL (ref 0.3–1.2)
Total Protein: 5.6 g/dL — ABNORMAL LOW (ref 6.5–8.1)

## 2021-04-21 LAB — AEROBIC CULTURE W GRAM STAIN (SUPERFICIAL SPECIMEN)
Culture: NO GROWTH
Special Requests: NORMAL

## 2021-04-21 LAB — LIPID PANEL
Cholesterol: 155 mg/dL (ref 0–200)
HDL: 32 mg/dL — ABNORMAL LOW (ref >40)
LDL Cholesterol: 89 mg/dL (ref 0–99)
Total CHOL/HDL Ratio: 4.8 RATIO
Triglycerides: 172 mg/dL — ABNORMAL HIGH (ref <150)
VLDL: 34 mg/dL (ref 0–40)

## 2021-04-21 LAB — HEMOGLOBIN AND HEMATOCRIT, BLOOD
HCT: 25.1 % — ABNORMAL LOW (ref 36.0–46.0)
HCT: 31.1 % — ABNORMAL LOW (ref 36.0–46.0)
Hemoglobin: 8.2 g/dL — ABNORMAL LOW (ref 12.0–15.0)
Hemoglobin: 9.4 g/dL — ABNORMAL LOW (ref 12.0–15.0)

## 2021-04-21 LAB — MAGNESIUM: Magnesium: 2.1 mg/dL (ref 1.7–2.4)

## 2021-04-21 MED ORDER — HEPARIN (PORCINE) 25000 UT/250ML-% IV SOLN
1550.0000 [IU]/h | INTRAVENOUS | Status: DC
  Administered 2021-04-21: 800 [IU]/h via INTRAVENOUS
  Administered 2021-04-22: 1200 [IU]/h via INTRAVENOUS
  Administered 2021-04-23: 1550 [IU]/h via INTRAVENOUS
  Filled 2021-04-21 (×3): qty 250

## 2021-04-21 MED ORDER — MUPIROCIN 2 % EX OINT
TOPICAL_OINTMENT | Freq: Two times a day (BID) | CUTANEOUS | Status: DC
  Administered 2021-04-22: 1 via TOPICAL
  Filled 2021-04-21 (×3): qty 22

## 2021-04-21 NOTE — Progress Notes (Signed)
Family Medicine Teaching Service Daily Progress Note Intern Pager: [phone]  Patient name: Robyn Shaffer Medical record number: [id number] Date of birth: Jun 10, 1986 Age: 35 y.o. Gender: female  Primary Care Provider: Patient, No Pcp Per (Inactive) Consultants: Cardiology, nephrology, infectious disease Code Status: Full  Pt Overview and Major Events to Date:  10/30 admitted 11/2 left kidney biopsy with IR  Assessment and Plan: Ms. Hutchins is a 35 year old female admitted for acute CHF exacerbation, hematuria, and AKI on CKD 3B.  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  Acute hypoxic respiratory failure secondary to acute CHF exacerbation Cardiology signed off 11/3. Output recorded as 3L last 24 hours.  Weight slowly downtrending, 64 kg today, down from 65 kg yesterday. - Continue strict I's and O's - Daily weights - Daily BMP - CBC every 48 hours  History of tricuspid valve endocarditis, MRSA bacteremia s/p TVR Received 33 mm bioprosthetic TVR 02/13/2021 at [facility]. Per cardiology, should be on warfarin through end of November.  Anticoagulation has been held this admission for left kidney biopsy on 11/2, discussed below.  Also should be on doxycycline x6 months for chronic suppression, started 03/30/2021; continue through mid April 2023.  Afebrile over this entire admission. - Continue doxycycline 100 mg every 12 x6 months (ending 09/28/2021) - For anticoagulation resumption, start heparin drip x24 hours (plan to transition to warfarin if hemoglobin stable) - Scheduled H&H for Hgb monitoring at noon, 4 PM, 8 PM - Per ID recs, collected repeat blood culture this AM to see if still finding persistent bacteremia  Right leg wound Cultured by ID on 11/3 due to slow healing status, wonder if Doxy resistant MRSA.  ID recommends continue to wash soap and water  and perform local wound care. - Daily mupirocin ointment  AKI and hematuria  CKD 3B  Hx temporary dialysis Of note, during admission 8/22-9/16 at [facility] for drug overdose, she required temporary dialysis.  Underwent left kidney biopsy with IR on 11/2, nephrologist [name] has called the [facility] nephrology pathology lab 11/4 in an attempt to get a preliminary read on the biopsy.  Per nephro, could discharge over the weekend if kidney function is stable, but would prefer to have pulmonary biopsy read.  DC'd Lasix yesterday.  Output recorded as 3L last 24 hours.  - Awaiting results of cryoglobulin, immunofixation, renal biopsy results - Maintain MAP >65 for renal perfusion - For anticoagulation resumption, start heparin drip x24 hours (plan to transition to warfarin if hemoglobin stable) - Scheduled H&H for Hgb monitoring at noon, 4 PM, 8 PM  Hypertension Currently on carvedilol 12.5 mg twice daily since 11/2.  Started amlodipine 5 mg daily on 11/4.  Evening and overnight blood pressures improved, last 124/81 with pulse 85. - Continue carvedilol 12.5 mg twice daily - Continue amlodipine 5 mg daily  Acute on chronic anemia Transfusion threshold <7.  No CBC this morning, however yesterday's hemoglobin 8.9.  We will obtain CBC Sunday morning. - CBC every 48 hours  History of IV drug use  Tobacco use COWS overnight  score of 1 for pulse rate. - Continue Suboxone 8-2 mg sublingual tablet daily - Nicotine patch 20 mg daily - TOC to provide clinic resources for continuation of Suboxone   FEN/GI: Regular diet PPx: SCDs Dispo:Home  possibly over the weekend . Barriers include continued clinical stability, receiving biopsy results, stable Hgb on resumption  of anticoagulation.   Subjective:  Patient sleeping comfortably in bed, awakened easily. No complaints. Reports breathing better. Discussed starting of heparin today and transition to warfarin tomorrow, all questions  answered.   Objective: Temp:  [98.1 F (36.7 C)-98.5 F (36.9 C)] 98.5 F (36.9 C) (11/05 0527) Pulse Rate:  [82-92] 90 (11/05 0527) Resp:  [15-20] 16 (11/05 0527) BP: (124-157)/(81-106) 148/98 (11/05 0527) SpO2:  [98 %-99 %] 99 % (11/05 0527) Weight:  [64 kg] 64 kg (11/05 0527) Physical Exam: General: awake, alert, no distress Cardiovascular: RRR, no murmur Respiratory: CTAB Extremities: trace edema in legs, leg wound stable  Laboratory: Recent Labs  Lab 04/18/21 0500 04/19/21 0545 04/20/21 0414  WBC 5.7 6.0 5.1  HGB 9.5* 8.8* 8.9*  HCT 28.7* 26.3* 26.5*  PLT 229 239 235   Recent Labs  Lab 04/16/21 0217 04/16/21 0718 04/19/21 1635 04/20/21 0414 04/21/21 0144  NA 136   < > 137 137 136  K 3.5   < > 3.7 3.5 3.9  CL 106   < > 105 104 106  CO2 21*   < > 24 23 21*  BUN 14   < > 31* 34* 34*  CREATININE 2.23*   < > 2.61* 2.66* 2.50*  CALCIUM 7.6*   < > 8.0* 8.2* 8.2*  PROT 5.5*  --   --  5.6* 5.6*  BILITOT 0.7  --   --  0.6 0.4  ALKPHOS 74  --   --  73 75  ALT 25  --   --  19 19  AST 31  --   --  38 44*  GLUCOSE 112*   < > 96 95 85   < > = values in this interval not displayed.    Imaging/Diagnostic Tests: None last 24 hours.   Ezequiel Essex, MD 04/21/2021, 6:51 AM PGY-2, [place] Intern pager: [phone], text pages welcome

## 2021-04-21 NOTE — Progress Notes (Signed)
The patient endorses feeling well today, and her bloody urine has cleared a lot. She denies any pain.  Exam: Gen: Sleepy Neuro: Grossly intact Heart: S1 S2 normal, RRR, no murmur Lungs: Air entry equal with faint rhonchi globally Ext: B/L Trace edema with well-healing right shin ulcer.  A/P: Acute respiratory Failure: Acute diastolic CHF with borderline EF (45-50%) S/P IV Lasix with great response. Currently off oxygen. We are holding her Lasix for now, per nephrology. She might need to go home on Lasix. Continue Coreg 12.5 mg BID F/U FLP Monitor I&Os  Hx of TV endocarditis S/P TVR (Bacteremia): Continue Doxy F/U repeat blood culture.  AKI with gross hematuria: Improving Per IR, ok to resume anticoagulant with Heparin. I discussed the risk and the benefit with the patient. She is amendable to resuming Heparin and monitoring her urine closely. I appreciate nephrology consultation.  Acute on Chronic Normocytic anemia: Monitor CBC closely  HTN: On Coreg, as above Started Norvasc 5 mg QD yesterday - may increase the dose if her BP remains elevated.

## 2021-04-21 NOTE — Progress Notes (Addendum)
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    Patient Measurements: Height: 5\' 9"  (175.3 cm) Weight: 64 kg (141 lb 1.6 oz) IBW/kg (Calculated) : 66.2 Heparin Dosing Weight: 72.7 kg  Vital Signs: Temp: 98.5 F (36.9 C) (11/05 1200) Temp Source: Oral (11/05 1200) BP: 133/85 (11/05 1200) Pulse Rate: 65 (11/05 1200)  Labs: Recent Labs    04/19/21 0545 04/19/21 1635 04/20/21 0414 04/21/21 0144 04/21/21 1143 04/21/21 1612  HGB 8.8*  --  8.9*  --  8.2*  --   HCT 26.3*  --  26.5*  --  25.1*  --   PLT 239  --  235  --   --   --   HEPARINUNFRC  --   --   --   --   --  <0.10*  CREATININE 2.53* 2.61* 2.66* 2.50*  --   --      Estimated Creatinine Clearance: 31.7 mL/min (A) (by C-G formula based on SCr of 2.5 mg/dL (H)).   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   Kidney infection    Polysubstance abuse (HCC)     Medications:  Scheduled:   acetaminophen  650 mg Oral Q6H   amLODipine  5 mg Oral Daily   buprenorphine-naloxone  1 tablet Sublingual Daily   carvedilol  12.5 mg Oral BID WC   doxycycline  100 mg Oral Q12H   famotidine  20 mg Oral Daily   hydrOXYzine  50 mg Oral TID   influenza vac split quadrivalent PF  0.5 mL Intramuscular Tomorrow-1000   mupirocin ointment   Topical BID   nicotine  21 mg Transdermal Daily   nystatin cream   Topical BID   QUEtiapine  50 mg Oral QHS    Assessment: 35 yo F presenting with acute CHF exacerbation, hematuria, and AKI on CKD 3B. Patient has history of tricuspid valve endocarditis and MRSA bacteremia s/p TVR (02/13/21) on warfarin PTA (INR goal 1.8-2.2).    Per cardiology, patient should be on warfarin through the end of November. Anticoagulation has been held this admission for left kidney biopsy on  11/2. Pharmacy consulted for heparin dosing.   Plans noted to start heparin gtt x 24 hours, then transition to warfarin if Hgb stable. -heparin level < 0.1, hg= 8.2 (last was 8.9)  Goal of Therapy:  Heparin level 0.2-0.5 units/ml Monitor platelets by anticoagulation protocol: Yes   Plan:  -Increase heparin to 1000 units/hr -Heparin level in 8 hours and daily wth CBC daily  Hildred Laser, PharmD Clinical Pharmacist **Pharmacist phone directory can now be found on [email or web address] ([email or web address]).  Listed under [place].

## 2021-04-21 NOTE — Progress Notes (Signed)
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   Kidney infection    Polysubstance abuse (HCC)     Medications:  Scheduled:   acetaminophen  650 mg Oral Q6H   amLODipine  5 mg Oral Daily   buprenorphine-naloxone  1 tablet Sublingual Daily   carvedilol  12.5 mg Oral BID WC   doxycycline  100 mg Oral Q12H   famotidine  20 mg Oral Daily   hydrOXYzine  50 mg Oral TID   influenza vac split quadrivalent PF  0.5 mL Intramuscular Tomorrow-1000   mupirocin ointment   Topical BID   nicotine  21 mg Transdermal Daily   nystatin cream   Topical BID   QUEtiapine  50 mg Oral QHS    Assessment: 35 yo F presenting with acute CHF exacerbation, hematuria, and AKI on CKD 3B. Patient has history of tricuspid valve endocarditis and MRSA bacteremia s/p TVR (02/13/21) on warfarin PTA (INR goal 1.8-2.2).    Per cardiology, patient should be on warfarin through the end of November. Anticoagulation has been held this admission for left kidney biopsy on 11/2. Pharmacy consulted for heparin dosing.   Hgb 8.9, plt 235 - stable. No signs/symptoms of bleeding  noted.  Plans noted to start heparin gtt x 24 hours, then transition to warfarin if Hgb stable. Will start heparin gtt at low infusion rate (no bolus).   Goal of Therapy:  Heparin level 0.2-0.5 units/ml Monitor platelets by anticoagulation protocol: Yes   Plan:  Start heparin infusion at 800 units/hr. Check ~6hr heparin level, CBC. Daily CBC, heparin level, INR. Monitor for signs/symptoms of bleeding. F/u plans to transition to warfarin, if Hgb stable.   Vance Peper, PharmD PGY1 Pharmacy Resident Phone [phone] 04/21/2021 8:21 AM   Please check AMION for all [facility] phone numbers After 10:00 PM, call [place] [phone]

## 2021-04-21 NOTE — Progress Notes (Addendum)
Family Medicine Teaching Service Daily Progress Note Intern Pager: [phone]  Patient name: Robyn Shaffer Medical record number: [id number] Date of birth: 1985-11-08 Age: 35 y.o. Gender: female  Primary Care Provider: Patient, No Pcp Per (Inactive) Consultants: nephrology, ID Code Status: Full  Pt Overview and Major Events to Date:  10/30 admitted 11/2 left kidney biopsy with IR  Assessment and Plan: Robyn Shaffer is a 35 year old female admitted for acute CHF exacerbation, hematuria, and AKI on CKD 3B.  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   History of tricuspid valve endocarditis, MRSA bacteremia s/p TVR Anticoagulation was held for kidney biopsy and restarted on 11/5. Patient was on 24 hour heparin to transition to warfarin (see below), however hemoglobin decreased overnight from 9.4 to 8. Will monitor today on heparin, consider restart of warfarin once stable.  Patient received 33 mm bioprosthetic TVR on February 13, 2021 at [facility].  Per cardiology, she should be on warfarin through end of November.  She should also be on doxycycline x6 months for chronic suppression, started 03/30/2021 and to continue through mid April 2023.  So far afebrile. - Hgb at 1300 - Continue heparin - Continue doxycycline 100 mg every 12 hours x6 months (and 09/28/2021) - Per ID recs, follow-up on blood culture for persistent bacteremia  AKI and hematuria  CKD 3B Awaiting results of left kidney biopsy, will return on Monday 11/7.  Lasix stopped 11/4.  Today renal function has mildly improved s cr 2.5>2.4. - Awaiting results of cryoglobulin, immunofixation, renal biopsy - Maintain MAP greater than 65 for renal perfusion - AM BMP  Acute hypoxic respiratory failure secondary to acute CHF exacerbation Lasix discontinued 11/4, cardiology s/o 11/3. UOP from  11/5 not charted. Weight today is 142.2 lbs, up 1 lb from yesterday.  - Strict I/Os - Daily weights - Daily BMP  Right leg wound Culture obtained by ED on 11/3 due to slow healing status.  Continue to perform local wound care. - Daily mupirocin ointment  Hypertension Currently on carvedilol 12.5 mg twice daily, amlodipine 5 mg daily.  BP in the last 24 hours shows. - Continue carvedilol 12.5 mg twice daily - Continue amlodipine 5 mg daily  Acute on chronic anemia Transfusion threshold 7 g/dL. CBC today shows hgb at 8, decreased from 9.4. - CBC every 48 hours  OUD with history of IVDU  Tobacco use COWS overnight score of 1. - Continue Suboxone 8-2 mg sublingual tablet daily -  Nicotine patch 21 mg daily -  TOC to provide clinic resources for continuation of Suboxone  FEN/GI: Diet PPx: Heparin Dispo:Home tomorrow. Barriers include transition to warfarin and stability of hgb.   Subjective:  Patient reports she is feeling fine, and does not note any concerns at this time.  Objective: Temp:  [97.9 F (36.6 C)-98.6 F (37 C)] 97.9 F (36.6 C) (11/05 1921) Pulse Rate:  [65-90] 82 (11/05 1921) Resp:  [15-18] 16 (11/05 1921) BP: (124-151)/(81-105) 139/95 (11/05 1921) SpO2:  [98 %-99 %] 98 % (11/05 1200) Weight:  [64 kg] 64 kg (11/05 0527) Physical Exam: General: appears older than stated age, WW, NAD, resting in bed, WNWD Cardiovascular: RRR, click after S2, no rub or gallop, 2+ radial pulses Respiratory: CTAB Abdomen: soft, NTND Extremities: warm, no edema, 2+ DP  Laboratory: Recent Labs  Lab 04/18/21 0500 04/19/21 0545 04/20/21 0414 04/21/21 1143 04/21/21 1938  WBC 5.7 6.0 5.1  --   --   HGB 9.5* 8.8* 8.9* 8.2* 9.4*  HCT 28.7* 26.3* 26.5* 25.1* 31.1*  PLT 229 239 235  --   --    Recent Labs  Lab 04/16/21 0217 04/16/21 0718 04/19/21 1635 04/20/21 0414 04/21/21 0144  NA 136   < > 137 137 136  K 3.5   < > 3.7 3.5 3.9  CL 106   < > 105 104 106  CO2 21*   <  > 24 23 21*  BUN 14   < > 31* 34* 34*  CREATININE 2.23*   < > 2.61* 2.66* 2.50*  CALCIUM 7.6*   < > 8.0* 8.2* 8.2*  PROT 5.5*  --   --  5.6* 5.6*  BILITOT 0.7  --   --  0.6 0.4  ALKPHOS 74  --   --  73 75  ALT 25  --   --  19 19  AST 31  --   --  38 44*  GLUCOSE 112*   < > 96 95 85   < > = values in this interval not displayed.   Imaging/Diagnostic Tests: No results found.  Gladys Damme, MD 04/21/2021, 11:40 PM PGY-3, [facility] Intern pager: [phone], text pages welcome

## 2021-04-21 NOTE — Progress Notes (Signed)
Nephrology Follow-Up Consult note   Assessment/Recommendations: Robyn Shaffer is a/an 35 y.o. 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      Non-Oliguric AKI (stable)/nephrotic syndrome: Differential is broad at this time but is concerning for glomerular disease possibly.  Periinfectious GN most likely but lupus is also possible based on serologies; other etiologies still possible.  Kidney function slightly improved today with good urine output despite holding diuresis. -The patient's biopsy will not be back till Monday.  Given her kidney function is stable and somewhat improved I think she is okay to discharge and follow-up with me in clinic.  I will arrange labs next week and call her when the biopsy returns.  I will see her in 2 to 3 weeks in clinic. -Follow up cryoglobulin, immunofixation -Follow up renal biopsy results -Continue to monitor daily Cr, Dose meds for GFR -Monitor Daily I/Os, Daily weight  -Maintain MAP>65 for optimal renal perfusion.  -Avoid nephrotoxic medications including NSAIDs -Currently no indication for HD   Acute HFmrEF Exacerbation: Has diuresed well, cardiology has signed off.  Urine output good despite Lasix.  Recommend discharging with Lasix 40 mg as needed for weight gain.  Would recommend that she watch her weight closely and take a dose if needed for fluid accumulation.   Hx of endocarditis and TVR: On Doxycycline x 6 months for chronic suppression. 1 of 4 positive Bcx with Staph hominis. ID consulted and believe this to likely be a contaminant and recommend redraw on 11/4. She has remained afebrile, WBC 5.    Hypertension: Continue carvedilol and amlodipine   Normocytic Anemia: Hgb stable- 9.5>8.8. Transfuse for Hgb<7 g/dL    H/o opioid use disorder: Clean for the last two months. On Suboxone. TOC provided patient with substance abuse resources for the outpatient setting.    [name], PGY-2 Family Medicine Resident  04/21/2021 10:19 AM  ___________________________________________________________  Interval History/Subjective: Resting this morning with no complaints.   Medications:  Current Facility-Administered Medications  Medication Dose Route Frequency Provider Last Rate Last Admin   0.9 %  sodium chloride infusion   Intravenous PRN Lenoria Chime, MD   Stopped at 04/16/21 [phone]   acetaminophen (TYLENOL) tablet 650 mg  650 mg Oral Q6H Carollee Leitz, MD   650 mg at 04/21/21 0925   amLODipine (NORVASC) tablet 5 mg  5 mg Oral Daily [name], MD   5 mg at 04/21/21 4315   buprenorphine-naloxone (SUBOXONE) 8-2 mg per SL tablet 1 tablet  1 tablet Sublingual Daily Dameron, Marisa, DO   1 tablet at 04/21/21 0925   carvedilol (COREG) tablet 12.5 mg  12.5 mg Oral BID WC [name], MD   12.5 mg at 04/21/21 4008   diclofenac Sodium (VOLTAREN) 1 % topical gel 4 g  4 g Topical QID PRN Lenoria Chime, MD   4 g at 04/16/21 2154   doxycycline (VIBRA-TABS) tablet 100 mg  100 mg Oral Q12H Carollee Leitz, MD   100 mg at 04/21/21 0925   famotidine (PEPCID) tablet 20 mg  20 mg Oral Daily Dameron, Marisa, DO   20 mg at 04/21/21 0925   heparin ADULT infusion 100 units/mL (25000 units/254mL)  800 Units/hr Intravenous Continuous Marcene Corning, RPH 8 mL/hr at 04/21/21 0934 800 Units/hr at 04/21/21 0934   hydrOXYzine (ATARAX/VISTARIL)  tablet 50 mg  50 mg Oral TID Orvis Brill, DO   50 mg at 04/21/21 0925   influenza vac split quadrivalent PF (FLUARIX) injection 0.5 mL  0.5 mL Intramuscular Tomorrow-1000 Pray, Norwood Levo, MD       mupirocin ointment (BACTROBAN) 2 %   Topical BID Ezequiel Essex, MD       nicotine (NICODERM CQ - dosed in mg/24 hours) patch 21 mg  21 mg Transdermal Daily Alen Bleacher, MD   21  mg at 04/21/21 7106   nystatin cream (MYCOSTATIN)   Topical BID Orvis Brill, DO   Given by Other at 04/21/21 0824   QUEtiapine (SEROQUEL) tablet 50 mg  50 mg Oral QHS Orvis Brill, DO   50 mg at 04/20/21 2215      Review of Systems: 10 systems reviewed and negative except per interval history/subjective  Physical Exam: Vitals:   04/21/21 0527 04/21/21 0814  BP: (!) 148/98 (!) 151/105  Pulse: 90 87  Resp: 16 18  Temp: 98.5 F (36.9 C) 98.6 F (37 C)  SpO2: 99% 98%   No intake/output data recorded.  Intake/Output Summary (Last 24 hours) at 04/21/2021 1019 Last data filed at 04/21/2021 2694 Gross per 24 hour  Intake 480 ml  Output 3000 ml  Net -2520 ml   Constitutional: well-appearing, sitting in bed in no distress ENMT: ears and nose without scars or lesions, MMM CV: normal rate, trace to 1+ pitting edema b/l shins  Respiratory: normal work of breathing with coarse crackles b/l to mid-lung Gastrointestinal: soft, non-tender, no rebound/guarding, normoactive bowel sounds Skin: Approximately 2 x2 cm wound to inferior medial right shin without surrounding erythema, non-tender, no drainage Psych: alert, judgement/insight appropriate, appropriate mood and affect   Test Results I personally reviewed new and old clinical labs and radiology tests Lab Results  Component Value Date   NA 136 04/21/2021   K 3.9 04/21/2021   CL 106 04/21/2021   CO2 21 (L) 04/21/2021   BUN 34 (H) 04/21/2021   CREATININE 2.50 (H) 04/21/2021   CALCIUM 8.2 (L) 04/21/2021   ALBUMIN 2.0 (L) 04/21/2021

## 2021-04-22 LAB — BASIC METABOLIC PANEL
Anion gap: 7 (ref 5–15)
BUN: 31 mg/dL — ABNORMAL HIGH (ref 6–20)
CO2: 22 mmol/L (ref 22–32)
Calcium: 8.4 mg/dL — ABNORMAL LOW (ref 8.9–10.3)
Chloride: 107 mmol/L (ref 98–111)
Creatinine, Ser: 2.44 mg/dL — ABNORMAL HIGH (ref 0.44–1.00)
GFR, Estimated: 26 mL/min — ABNORMAL LOW (ref >60)
Glucose, Bld: 89 mg/dL (ref 70–99)
Potassium: 3.8 mmol/L (ref 3.5–5.1)
Sodium: 136 mmol/L (ref 135–145)

## 2021-04-22 LAB — HEPARIN LEVEL (UNFRACTIONATED)
Heparin Unfractionated: <0.1 IU/mL — ABNORMAL LOW (ref 0.30–0.70)
Heparin Unfractionated: 0.13 IU/mL — ABNORMAL LOW (ref 0.30–0.70)
Heparin Unfractionated: <0.1 IU/mL — ABNORMAL LOW (ref 0.30–0.70)

## 2021-04-22 LAB — CBC
HCT: 24.3 % — ABNORMAL LOW (ref 36.0–46.0)
HCT: 30.2 % — ABNORMAL LOW (ref 36.0–46.0)
Hemoglobin: 8 g/dL — ABNORMAL LOW (ref 12.0–15.0)
Hemoglobin: 9.8 g/dL — ABNORMAL LOW (ref 12.0–15.0)
MCH: 30.1 pg (ref 26.0–34.0)
MCH: 29.9 pg (ref 26.0–34.0)
MCHC: 32.9 g/dL (ref 30.0–36.0)
MCHC: 32.5 g/dL (ref 30.0–36.0)
MCV: 91.4 fL (ref 80.0–100.0)
MCV: 92.1 fL (ref 80.0–100.0)
Platelets: 239 10*3/uL (ref 150–400)
Platelets: 288 10*3/uL (ref 150–400)
RBC: 2.66 MIL/uL — ABNORMAL LOW (ref 3.87–5.11)
RBC: 3.28 MIL/uL — ABNORMAL LOW (ref 3.87–5.11)
RDW: 17.7 % — ABNORMAL HIGH (ref 11.5–15.5)
RDW: 17.7 % — ABNORMAL HIGH (ref 11.5–15.5)
WBC: 5.2 10*3/uL (ref 4.0–10.5)
WBC: 6.3 10*3/uL (ref 4.0–10.5)
nRBC: 0 % (ref 0.0–0.2)
nRBC: 0 % (ref 0.0–0.2)

## 2021-04-22 LAB — PROTIME-INR
INR: 1 (ref 0.8–1.2)
INR: 1 (ref 0.8–1.2)
INR: 1 (ref 0.8–1.2)
Prothrombin Time: 13.4 seconds (ref 11.4–15.2)
Prothrombin Time: 13.6 seconds (ref 11.4–15.2)
Prothrombin Time: 13.3 seconds (ref 11.4–15.2)

## 2021-04-22 LAB — CRYOGLOBULIN

## 2021-04-22 NOTE — Progress Notes (Signed)
Spoke with Case Manager Tomi Bamberger who states since the offices are closed she will be unable to find pt a PCP today. She will get her team to see the patient tomorrow.  Patient will also need to establish with an area cardiologist.    Lyndee Hensen, DO

## 2021-04-22 NOTE — Progress Notes (Signed)
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    Patient Measurements: Height: 5\' 9"  (175.3 cm) Weight: 64.5 kg (142 lb 3.2 oz) IBW/kg (Calculated) : 66.2 Heparin Dosing Weight: 72.7 kg  Vital Signs: Temp: 97.7 F (36.5 C) (11/06 0431) Temp Source: Oral (11/06 0431) BP: 147/99 (11/06 0431) Pulse Rate: 80 (11/06 0431)  Labs: Recent Labs    04/20/21 0414 04/21/21 0144 04/21/21 1143 04/21/21 1612 04/21/21 1938 04/22/21 0235  HGB 8.9*  --  8.2*  --  9.4* 8.0*  HCT 26.5*  --  25.1*  --  31.1* 24.3*  PLT 235  --   --   --   --  239  LABPROT  --   --   --   --   --  13.4  INR  --   --   --   --   --  1.0  HEPARINUNFRC  --   --   --  <0.10*  --  <0.10*  CREATININE 2.66* 2.50*  --   --   --  2.44*    Estimated Creatinine Clearance: 32.8 mL/min (A) (by C-G formula based on SCr of 2.44 mg/dL (H)).   Medications:  Scheduled:   acetaminophen  650 mg Oral Q6H   amLODipine  5 mg Oral Daily   buprenorphine-naloxone  1 tablet Sublingual Daily   carvedilol  12.5 mg Oral BID WC   doxycycline  100 mg Oral Q12H   famotidine  20 mg Oral Daily   hydrOXYzine  50 mg Oral TID   influenza vac split quadrivalent PF  0.5 mL Intramuscular Tomorrow-1000   mupirocin ointment   Topical BID   nicotine  21 mg Transdermal Daily   nystatin cream   Topical BID   QUEtiapine  50 mg Oral QHS   Infusions:   sodium chloride Stopped (04/16/21 0953)   heparin 1,200 Units/hr (04/22/21 0418)    Assessment: 35 yo F presenting with acute CHF exacerbation, hematuria, and AKI on CKD 3B. Patient has history of tricuspid valve endocarditis and MRSA bacteremia s/p TVR (02/13/21) on warfarin PTA (INR goal 1.8-2.2).     Per cardiology, patient should be on warfarin through the end of November. Anticoagulation has been held this admission for  left kidney biopsy on 11/2. Pharmacy consulted for heparin dosing.   Plans noted to start heparin gtt x 24 hours, then transition to warfarin if Hgb stable.   INR 1.0 today, and heparin level subtherapeutic at 0.13, on 1200 units/hr. Hgb 8.0>9.8, plt 239>288. No line issues or signs/symptoms of bleeding noted per RN.  Goal of Therapy:  Heparin level 0.2-0.5 units/ml Monitor platelets by anticoagulation protocol: Yes   Plan:  Increase heparin to 1300 units/hr. Check ~6hr heparin level.  Daily CBC, heparin level, INR.  Monitor for signs/symptoms of bleeding.    Vance Peper, PharmD PGY1 Pharmacy Resident Phone [phone] 04/22/2021 7:22 AM   Please check AMION for all [facility] phone numbers After 10:00 PM, call [place] [phone]

## 2021-04-22 NOTE — Progress Notes (Signed)
Nephrology Follow-Up Consult note   Assessment/Recommendations: Robyn Shaffer is a/an 35 y.o. 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      Non-Oliguric AKI (stable)/nephrotic syndrome: Differential is broad at this time but is concerning for glomerular disease possibly.  Periinfectious GN most likely but lupus is also possible based on serologies; other etiologies still possible.  Kidney function slightly improved today with good urine output despite holding diuresis. -Hopefully will have biopsy results tomorrow -Follow up immunofixation -Continue to monitor daily Cr, Dose meds for GFR -Monitor Daily I/Os, Daily weight  -Maintain MAP>65 for optimal renal perfusion.  -Avoid nephrotoxic medications including NSAIDs -Currently no indication for HD   Acute HFmrEF Exacerbation: Has diuresed well, cardiology has signed off.  Urine output good despite Lasix.  Continue off diuretics for now   Hx of endocarditis and TVR: On Doxycycline x 6 months for chronic suppression. 1 of 4 positive Bcx with Staph hominis. ID consulted and believe this to likely be a contaminant and recommend redraw on 11/4. She has remained afebrile, WBC 5.  On heparin infusion for anticoagulation.   Hypertension: Continue carvedilol and amlodipine   Normocytic Anemia: Hgb stable- 9.5>8.8. Transfuse for Hgb<7 g/dL   H/o opioid use disorder: Clean for the last two months. On Suboxone. TOC provided patient with substance abuse resources for the outpatient setting.    [name], PGY-2 Family Medicine Resident  04/22/2021 9:09 AM  ___________________________________________________________  Interval History/Subjective: Patient feels well today with no complaints   Medications:   Current Facility-Administered Medications  Medication Dose Route Frequency Provider Last Rate Last Admin   0.9 %  sodium chloride infusion   Intravenous PRN Lenoria Chime, MD   Stopped at 04/16/21 0953   acetaminophen (TYLENOL) tablet 650 mg  650 mg Oral Q6H Carollee Leitz, MD   650 mg at 04/22/21 0853   amLODipine (NORVASC) tablet 5 mg  5 mg Oral Daily [name], MD   5 mg at 04/22/21 0853   buprenorphine-naloxone (SUBOXONE) 8-2 mg per SL tablet 1 tablet  1 tablet Sublingual Daily Dameron, Marisa, DO   1 tablet at 04/22/21 0852   carvedilol (COREG) tablet 12.5 mg  12.5 mg Oral BID WC [name], MD   12.5 mg at 04/22/21 3790   diclofenac Sodium (VOLTAREN) 1 % topical gel 4 g  4 g Topical QID PRN Lenoria Chime, MD   4 g at 04/21/21 2141   doxycycline (VIBRA-TABS) tablet 100 mg  100 mg Oral Q12H Carollee Leitz, MD   100 mg at 04/22/21 0853   famotidine (PEPCID) tablet 20 mg  20 mg Oral Daily Dameron, Marisa, DO   20 mg at 04/22/21 0853   heparin ADULT infusion 100 units/mL (25000 units/258mL)  1,200 Units/hr Intravenous Continuous Erenest Blank, RPH 12 mL/hr at 04/22/21 0418 1,200 Units/hr at 04/22/21 0418   hydrOXYzine (ATARAX/VISTARIL) tablet 50 mg  50 mg Oral TID Orvis Brill, DO   50 mg at 04/22/21 0853   influenza vac split quadrivalent PF (FLUARIX) injection 0.5 mL  0.5 mL Intramuscular Tomorrow-1000 Lenoria Chime, MD       mupirocin ointment (BACTROBAN) 2 %   Topical BID Ezequiel Essex, MD   1 application at 24/09/73 0900   nicotine (NICODERM CQ - dosed in mg/24 hours) patch 21 mg  21 mg Transdermal Daily Alen Bleacher, MD   21 mg at 04/22/21 4920   nystatin cream (MYCOSTATIN)   Topical BID Orvis Brill, DO   1 application at 03/24/11 0902   QUEtiapine (SEROQUEL) tablet 50 mg  50 mg Oral QHS Orvis Brill, DO   50 mg at 04/21/21 2137      Review of Systems: 10 systems reviewed and negative except per interval history/subjective  Physical Exam: Vitals:    04/22/21 0431 04/22/21 0852  BP: (!) 147/99 (!) 165/11  Pulse: 80 82  Resp: 16   Temp: 97.7 F (36.5 C)   SpO2:     Total I/O In: 480 [P.O.:480] Out: 800 [Urine:800]  Intake/Output Summary (Last 24 hours) at 04/22/2021 1975 Last data filed at 04/22/2021 0800 Gross per 24 hour  Intake 764.22 ml  Output 1375 ml  Net -610.78 ml   Constitutional: well-appearing, sitting in bed in no distress ENMT: ears and nose without scars or lesions, MMM CV: normal rate, trace to 1+ pitting edema b/l shins  Respiratory: normal work of breathing with coarse crackles b/l to mid-lung Gastrointestinal: soft, non-tender, no rebound/guarding, normoactive bowel sounds Psych: alert, judgement/insight appropriate, appropriate mood and affect   Test Results I personally reviewed new and old clinical labs and radiology tests Lab Results  Component Value Date   NA 136 04/22/2021   K 3.8 04/22/2021   CL 107 04/22/2021   CO2 22 04/22/2021   BUN 31 (H) 04/22/2021   CREATININE 2.44 (H) 04/22/2021   CALCIUM 8.4 (L) 04/22/2021   ALBUMIN 2.0 (L) 04/21/2021

## 2021-04-22 NOTE — Progress Notes (Signed)
FPTS Brief Note Reviewed patient's vitals, recent notes.  Vitals:   04/21/21 1732 04/21/21 1921  BP: (!) 146/100 (!) 139/95  Pulse: 79 82  Resp: 18 16  Temp: 98.3 F (36.8 C) 97.9 F (36.6 C)  SpO2:     At this time, no change in plan from day progress note.  Gladys Damme, MD Page [phone] with questions about this patient.

## 2021-04-22 NOTE — Progress Notes (Addendum)
Family Medicine Teaching Service Daily Progress Note Intern Pager: [phone]  Patient name: Robyn Shaffer Medical record number: [id number] Date of birth: 06-24-1985 Age: 35 y.o. Gender: female  Primary Care Provider: Patient, No Pcp Per (Inactive) Consultants: Neprhology, IR, Cardiology (signed off), ID. Code Status: Full  Pt Overview and Major Events to Date:  10/30: Patient admitted to FMTS 11/2: Left Renal US-guided biopsy  Assessment and Plan: Robyn Shaffer is a 35 year old female admitted for acute CHF exacerbation treated with diuresis that is now resolved, hematuria s/p kidney biopsy, AKI in setting of CKD stage IIIb.  Past medical history significant for MRSA endocarditis and bacteremia s/p TVR, septic pulmonary emboli, nephrotic syndrome, IV drug use currently on Suboxone.  History of tricuspid valve endocarditis MRSA bacteremia s/p TVR Patient is continued on heparin gtt. as hemoglobin was not in appropriate range to consider restarting warfarin.  Per cardiology, she should remain on warfarin through the end of November 2022.  Will remain on doxycycline x6 months for chronic suppression (start date 03/30/2021; end date 09/2021).  Patient remains afebrile. Hemoglobin this morning 7.7; normocytic anemia, MCV 92.4 -Hold Heparin d/t decreased Hgb  Nonoliguric AKI/nephrotic syndrome Differential remains broad but includes periinfectious glomerulonephritis, others.  Nephrology is following and awaiting renal biopsy results. UOP 2.25 L; Cr stable at 2.41, dec from 2.44.  -Nephrology following, greatly appreciate their care patient will follow up outpatient with Robyn Shaffer in 2 to 3 weeks in clinic and will call her with biopsy results (C3+ DPGN w/ 46% crescents c/w peri-infectious GN). -Monitor daily I's/O's, daily weights -Avoid nephrotoxic medications -D/c anticoagulation until able to get PCP or cardiology follow-up  Perinephric Hematoma  Per IR: if worsened (flank pain, significant drop  in HCT), consider arterial embolization. -Would first obtain Renal US prior to IR consult.  Hypertension BP 147-152/104-115 -Increase Norvasc to 10 mg -Cont. Coreg 12.5 mg BID  Normocytic anemia Hgb 7.7 -Iron, TIBC, and ferritin ordered -Nephrology giving ESA; per their recs, will hold off on transfusion at this time, as patient is asymptomatic, and transfusion can complicate ability to obtain transplant in the future  In the case patient becomes symptomatic, consent obtained for transfusion. -Transfusion threshold Hgb <8.0; continue to monitor with daily CBC  History of opioid use disorder Overnight COWS scores of 0-1. -TOC provided patient with substance abuse resources for outpatient setting -Discontinue COWS monitoring for >48 hours of scores less than 5. -Nicotine patch 20 mg daily -Suboxone 8/2 mg sublingual tablet daily  Acute HFmrERF exacerbation-resolved No longer diuresing. Patient asymptomatic.  Cardiology has signed off.  Nephrology recommends discharge with Lasix 40 mg as needed for weight gain and that she watches her weight closely. -Patient will need outpatient cardiology follow-up -If gaining weight or pedal edema, give PRN Lasix 40 mg.   FEN/GI: Heart healthy PPx: Holding due to increased bleeding risk Dispo:Home pending clinical improvement.  Subjective:  Patient feels well today with no complaints. She still endorses a cough, but denies lightheadedness, HA, chest pain, or abnormalities in voiding. Hematuria improving.   Objective: Temp:  [97.7 F (36.5 C)-98.1 F (36.7 C)] 98 F (36.7 C) (11/06 2022) Pulse Rate:  [79-85] 82 (11/06 2022) Resp:  [16-26] 26 (11/06 2022) BP: (138-165)/(95-111) 160/104 (11/06 2022) SpO2:  [98 %] 98 % (11/06 2022) Weight:  [64.5 kg] 64.5 kg (11/06 0431) Physical Exam: General: Female appearing older than stated age, in no apparent distress, sitting up in bed Cardiovascular: RRR, no abnormal heart sounds auscultated  (although she has  recently had a click after S2), no murmurs appreciated. Respiratory: CTA x 2, normal effort on room air Abdomen: Soft, non-tender, non-distended Extremities: Non-edematous LE, warm, with well-healed scarring bilaterally  Laboratory: Recent Labs  Lab 04/20/21 0414 04/21/21 1143 04/21/21 1938 04/22/21 0235 04/22/21 1155  WBC 5.1  --   --  5.2 6.3  HGB 8.9*   < > 9.4* 8.0* 9.8*  HCT 26.5*   < > 31.1* 24.3* 30.2*  PLT 235  --   --  239 288   < > = values in this interval not displayed.   Recent Labs  Lab 04/16/21 0217 04/16/21 0718 04/20/21 0414 04/21/21 0144 04/22/21 0235  NA 136   < > 137 136 136  K 3.5   < > 3.5 3.9 3.8  CL 106   < > 104 106 107  CO2 21*   < > 23 21* 22  BUN 14   < > 34* 34* 31*  CREATININE 2.23*   < > 2.66* 2.50* 2.44*  CALCIUM 7.6*   < > 8.2* 8.2* 8.4*  PROT 5.5*  --  5.6* 5.6*  --   BILITOT 0.7  --  0.6 0.4  --   ALKPHOS 74  --  73 75  --   ALT 25  --  19 19  --   AST 31  --  38 44*  --   GLUCOSE 112*   < > 95 85 89   < > = values in this interval not displayed.     Imaging/Diagnostic Tests: No results found.   Robyn Schlatter, MD PGY-1 04/23/2021 [place] Intern pager: [phone], text pages welcome

## 2021-04-22 NOTE — Progress Notes (Signed)
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    Patient Measurements: Height: 5\' 9"  (175.3 cm) Weight: 64 kg (141 lb 1.6 oz) IBW/kg (Calculated) : 66.2 Heparin Dosing Weight: 72.7 kg  Vital Signs: Temp: 97.9 F (36.6 C) (11/05 1921) Temp Source: Oral (11/05 1921) BP: 139/95 (11/05 1921) Pulse Rate: 82 (11/05 1921)  Labs: Recent Labs    04/19/21 0545 04/19/21 1635 04/20/21 0414 04/21/21 0144 04/21/21 1143 04/21/21 1612 04/21/21 1938 04/22/21 0235  HGB 8.8*  --  8.9*  --  8.2*  --  9.4* 8.0*  HCT 26.3*  --  26.5*  --  25.1*  --  31.1* 24.3*  PLT 239  --  235  --   --   --   --  239  LABPROT  --   --   --   --   --   --   --  13.4  INR  --   --   --   --   --   --   --  1.0  HEPARINUNFRC  --   --   --   --   --  <0.10*  --  <0.10*  CREATININE 2.53*   < > 2.66* 2.50*  --   --   --  2.44*   < > = values in this interval not displayed.     Estimated Creatinine Clearance: 32.5 mL/min (A) (by C-G formula based on SCr of 2.44 mg/dL (H)).   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   Kidney infection    Polysubstance abuse (HCC)     Medications:  Scheduled:   acetaminophen  650 mg Oral Q6H   amLODipine  5 mg Oral Daily   buprenorphine-naloxone  1 tablet Sublingual Daily   carvedilol  12.5 mg Oral BID WC   doxycycline  100 mg Oral Q12H   famotidine  20 mg Oral Daily   hydrOXYzine  50 mg Oral TID   influenza vac split quadrivalent PF  0.5 mL Intramuscular Tomorrow-1000   mupirocin ointment   Topical BID   nicotine  21 mg Transdermal Daily   nystatin cream   Topical BID   QUEtiapine  50 mg Oral QHS    Assessment: 35 yo F presenting with acute CHF exacerbation, hematuria, and AKI on CKD 3B. Patient has history of tricuspid valve endocarditis  and MRSA bacteremia s/p TVR (02/13/21) on warfarin PTA (INR goal 1.8-2.2).    Per cardiology, patient should be on warfarin through the end of November. Anticoagulation has been held this admission for left kidney biopsy on 11/2. Pharmacy consulted for heparin dosing.   Plans noted to start heparin gtt x 24 hours, then transition to warfarin if Hgb stable. -heparin level < 0.1, hg= 8.2 (last was 8.9)  11/6 AM update:  Heparin level low  Goal of Therapy:  Heparin level 0.2-0.5 units/ml Monitor platelets by anticoagulation protocol: Yes   Plan:  -Inc heparin to 1200 units/hr -1200 heparin level  Narda Bonds, PharmD, BCPS Clinical Pharmacist Phone: [phone]

## 2021-04-22 NOTE — Progress Notes (Addendum)
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    Patient Measurements: Height: 5\' 9"  (175.3 cm) Weight: 64.5 kg (142 lb 3.2 oz) IBW/kg (Calculated) : 66.2 Heparin Dosing Weight: 72.7 kg  Vital Signs: Temp: 98 F (36.7 C) (11/06 2022) Temp Source: Oral (11/06 1251) BP: 160/104 (11/06 2022) Pulse Rate: 82 (11/06 2022)  Labs: Recent Labs    04/20/21 0414 04/21/21 0144 04/21/21 1143 04/21/21 1938 04/22/21 0235 04/22/21 1155 04/22/21 1836  HGB 8.9*  --    < > 9.4* 8.0* 9.8*  --   HCT 26.5*  --    < > 31.1* 24.3* 30.2*  --   PLT 235  --   --   --  239 288  --   LABPROT  --   --   --   --  13.4 13.6 13.3  INR  --   --   --   --  1.0 1.0 1.0  HEPARINUNFRC  --   --    < >  --  <0.10* 0.13* <0.10*  CREATININE 2.66* 2.50*  --   --  2.44*  --   --    < > = values in this interval not displayed.     Estimated Creatinine Clearance: 32.8 mL/min (A) (by C-G formula based on SCr of 2.44 mg/dL (H)).   Medications:  Scheduled:   acetaminophen  650 mg Oral Q6H   amLODipine  5 mg Oral Daily   buprenorphine-naloxone  1 tablet Sublingual Daily   carvedilol  12.5 mg Oral BID WC   doxycycline  100 mg Oral Q12H   famotidine  20 mg Oral Daily   hydrOXYzine  50 mg Oral TID   mupirocin ointment   Topical BID   nicotine  21 mg Transdermal Daily   nystatin cream   Topical BID   QUEtiapine  50 mg Oral QHS   Infusions:   sodium chloride Stopped (04/16/21 0953)   heparin 1,300 Units/hr (04/22/21 1511)    Assessment: 35 yo F presenting with acute CHF exacerbation, hematuria, and AKI on CKD 3B. Patient has history of tricuspid valve endocarditis and MRSA bacteremia s/p TVR (02/13/21) on warfarin PTA (INR goal 1.8-2.2).     Per cardiology, patient should be on warfarin through the end of November. Anticoagulation has  been held this admission for left kidney biopsy on 11/2. Pharmacy consulted for heparin dosing.   Plans noted to start heparin gtt, then transition to warfarin if Hgb stable.   -heparin level < 0.1 (about 3 hours after the last rate change)  Goal of Therapy:  Heparin level 0.2-0.5 units/ml Monitor platelets by anticoagulation protocol: Yes   Plan:  Increase heparin to 1450 units/hr. Daily CBC, heparin level, INR.  Monitor for signs/symptoms of bleeding.   Hildred Laser, PharmD Clinical Pharmacist **Pharmacist phone directory can now be found on [email or web address] ([email or web address]).  Listed under [place].

## 2021-04-23 DIAGNOSIS — R319 Hematuria, unspecified: Secondary | ICD-10-CM | POA: Diagnosis not present

## 2021-04-23 DIAGNOSIS — J810 Acute pulmonary edema: Secondary | ICD-10-CM | POA: Diagnosis not present

## 2021-04-23 DIAGNOSIS — R0600 Dyspnea, unspecified: Secondary | ICD-10-CM | POA: Diagnosis not present

## 2021-04-23 DIAGNOSIS — R0603 Acute respiratory distress: Secondary | ICD-10-CM | POA: Diagnosis not present

## 2021-04-23 LAB — BASIC METABOLIC PANEL
Anion gap: 9 (ref 5–15)
BUN: 34 mg/dL — ABNORMAL HIGH (ref 6–20)
CO2: 19 mmol/L — ABNORMAL LOW (ref 22–32)
Calcium: 8.3 mg/dL — ABNORMAL LOW (ref 8.9–10.3)
Chloride: 109 mmol/L (ref 98–111)
Creatinine, Ser: 2.41 mg/dL — ABNORMAL HIGH (ref 0.44–1.00)
GFR, Estimated: 26 mL/min — ABNORMAL LOW (ref >60)
Glucose, Bld: 93 mg/dL (ref 70–99)
Potassium: 3.8 mmol/L (ref 3.5–5.1)
Sodium: 137 mmol/L (ref 135–145)

## 2021-04-23 LAB — IRON AND TIBC
Iron: 61 ug/dL (ref 28–170)
Saturation Ratios: 30 % (ref 10.4–31.8)
TIBC: 206 ug/dL — ABNORMAL LOW (ref 250–450)
UIBC: 145 ug/dL

## 2021-04-23 LAB — CBC
HCT: 24.4 % — ABNORMAL LOW (ref 36.0–46.0)
Hemoglobin: 7.7 g/dL — ABNORMAL LOW (ref 12.0–15.0)
MCH: 29.2 pg (ref 26.0–34.0)
MCHC: 31.6 g/dL (ref 30.0–36.0)
MCV: 92.4 fL (ref 80.0–100.0)
Platelets: 281 10*3/uL (ref 150–400)
RBC: 2.64 MIL/uL — ABNORMAL LOW (ref 3.87–5.11)
RDW: 17.3 % — ABNORMAL HIGH (ref 11.5–15.5)
WBC: 5.8 10*3/uL (ref 4.0–10.5)
nRBC: 0 % (ref 0.0–0.2)

## 2021-04-23 LAB — PROTIME-INR
INR: 1.1 (ref 0.8–1.2)
Prothrombin Time: 14.2 seconds (ref 11.4–15.2)

## 2021-04-23 LAB — PREPARE RBC (CROSSMATCH)

## 2021-04-23 LAB — FERRITIN: Ferritin: 342 ng/mL — ABNORMAL HIGH (ref 11–307)

## 2021-04-23 LAB — HEMOGLOBIN AND HEMATOCRIT, BLOOD
HCT: 23.9 % — ABNORMAL LOW (ref 36.0–46.0)
Hemoglobin: 7.7 g/dL — ABNORMAL LOW (ref 12.0–15.0)

## 2021-04-23 LAB — ABO/RH: ABO/RH(D): O POS

## 2021-04-23 LAB — HEPARIN LEVEL (UNFRACTIONATED): Heparin Unfractionated: 0.16 IU/mL — ABNORMAL LOW (ref 0.30–0.70)

## 2021-04-23 MED ORDER — AMLODIPINE BESYLATE 10 MG PO TABS
10.0000 mg | ORAL_TABLET | Freq: Every day | ORAL | Status: DC
  Administered 2021-04-24 – 2021-04-27 (×4): 10 mg via ORAL
  Filled 2021-04-23 (×4): qty 1

## 2021-04-23 MED ORDER — SODIUM CHLORIDE 0.9% IV SOLUTION: Freq: Once | INTRAVENOUS | Status: DC

## 2021-04-23 MED ORDER — DARBEPOETIN ALFA 40 MCG/0.4ML IJ SOSY
40.0000 ug | PREFILLED_SYRINGE | Freq: Once | INTRAMUSCULAR | Status: AC
  Administered 2021-04-23: 40 ug via SUBCUTANEOUS
  Filled 2021-04-23: qty 0.4

## 2021-04-23 MED ORDER — AMLODIPINE BESYLATE 5 MG PO TABS
5.0000 mg | ORAL_TABLET | Freq: Once | ORAL | Status: AC
  Administered 2021-04-23: 5 mg via ORAL
  Filled 2021-04-23: qty 1

## 2021-04-23 NOTE — Progress Notes (Addendum)
[facility] Progress Note  Name: Robyn Shaffer MRN: [id number] DOB: 08/18/1985  Chief Complaint:  Hematuria  Subjective:  [name] feels well this morning. States that her hematuria is about the same. She has an intermittent clear productive cough but feels that her breathing is much improved from admission. She does not feel that she has any swelling anymore.   Review of systems:  Denies abdominal pain, dysuria, edema, difficulty breathing, nausea, vomiting, diarrhea.  Positive for cough and hematuria.    Intake/Output Summary (Last 24 hours) at 04/23/2021 0827 Last data filed at 04/23/2021 0256 Gross per 24 hour  Intake 240 ml  Output 1450 ml  Net -1210 ml    Vitals:  Vitals:   04/22/21 1800 04/22/21 2022 04/22/21 2359 04/23/21 0403  BP: (!) 155/108 (!) 160/104 125/84 (!) 151/115  Pulse: 85 82 86 85  Resp:  (!) 26 15 18   Temp:  98 F (36.7 C) 98.6 F (37 C) 98.1 F (36.7 C)  TempSrc:   Oral Oral  SpO2:  98% 99% 99%  Weight:    64 kg  Height:         Physical Exam:  General: Awake, alert, watching TV in no acute distress, pleasant and cooperative with examination HEENT: Normocephalic, atraumatic, nares patent Cardio: RRR without murmur, 2+ radial, DP pulses b/l, trace to 1+ pitting edema b/l lower extremities to knee Respiratory: Normal work of breathing. Rhonchorous breath sounds throughout  Abdomen: Soft, non-tender to palpation of all quadrants, non-distended, no rebound/guarding MSK: Able to move all extremities spontaneously without obvious deformities Neuro: Speech is clear and intact, no focal deficits Psych: Normal mood and affect  Medications reviewed   Labs:  BMP Latest Ref Rng & Units 04/23/2021 04/22/2021 04/21/2021  Glucose 70 - 99 mg/dL 93 89 85  BUN 6 - 20 mg/dL 34(H) 31(H) 34(H)  Creatinine 0.44 - 1.00 mg/dL 2.41(H) 2.44(H) 2.50(H)  Sodium 135 - 145 mmol/L 137 136 136  Potassium 3.5 - 5.1 mmol/L 3.8 3.8 3.9  Chloride 98 - 111  mmol/L 109 107 106  CO2 22 - 32 mmol/L 19(L) 22 21(L)  Calcium 8.9 - 10.3 mg/dL 8.3(L) 8.4(L) 8.2(L)     Assessment/Plan:  Robyn Shaffer is a 35 y.o. 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  Non-Oliguric AKI/Nephrotic Syndrome:  Differential is broad but continues to be concerning for glomerular disease given proteinuria and hematuria. Given her history of recent endocarditis, it is possible that she may have a resolving peri-infectious GN. Lupus nephritis also possible. Creatinine is stable 2.44>2.41. Hematuria appears stable. Cryoglobulin was negative x72 hours. Immunofixation and renal biopsy results are still pending. Medications reviewed, voltraen gel d/c as patient has not been using frequently and this could affect renal function slightly.   Acute HFmrEF Excacerbation: Improved. Per primary.  Hx of Endocarditis and TVR: Continues on Doxycycline x6 months for chronic suppression. Anticoagulated on warfarin PTA. Plan was for heparin gtt x24 hours then transition to warfarin but the heparin drip was discontinued this morning given drop in hgb.   Hypertension: Still hypertensive, ranging 389'H systolic and 734'K diastolic. On Amlodipine and Carvedilol. Consider increasing dosage or addition of another agent for improved control. Will need to establish with PCP prior to d/c.   Normocytic Anemia: Hgb 7.7. Heparin  gtt stopped this morning. Continue to monitor for signs/symptoms of bleeding. Ideally would like Hgb >8 given renal dysfunction. Will check iron panel. May start on Epo if patient amenable.    H/o Opioid Use Disorder: In remission. On Suboxone.    Robyn Settler, DO 04/23/2021 8:27 AM   Seen and examined independently.  Agree  with note and exam as documented above by [name] and as noted here.  [name] was contacted with the biopsy result.  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 Felt to be consistent with peri-infectious GN  General adult female in bed in no acute distress HEENT normocephalic atraumatic extraocular movements intact sclera anicteric Neck supple trachea midline Lungs normal work of breathing at rest on room air Abdomen soft nontender nondistended Extremities no edema  Psych normal mood and affect  AKI secondary to peri-infectious GN.  Result was discussed with [name] who contacted me and this is being faxed to the HD unit as well.  Continue supportive care.    Hx endocarditis - continue doxycycline  Normocytic Anemia - setting of renal failure and acute illness.  she has a remote hx of malignancy - cervical cancer 16 years ago per her report.  Aranesp 40 mcg once today.  We discussed risks/benefits/indications and she does agree to ESA  HTN - increase amlodipine to 10 mg daily   Robyn Desanctis, MD 04/23/2021 10:56 AM

## 2021-04-23 NOTE — Progress Notes (Signed)
Paged family medicine, H&H resulted.

## 2021-04-23 NOTE — Progress Notes (Signed)
Hgb dropped 2 points. Stopped heparin drip. STAT H&H.   Ezequiel Essex, MD

## 2021-04-23 NOTE — Progress Notes (Signed)
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10* 0.16*  CREATININE 2.50*  --  2.44*  --   --  2.41*   < > = values in this interval not displayed.     Estimated Creatinine Clearance: 32.9 mL/min (A) (by C-G formula based on SCr of 2.41 mg/dL (H)).   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 Patient has history of tricuspid valve endocarditis and MRSA bacteremia s/p TVR (02/13/21) on warfarin PTA (INR goal 1.8-2.2).    Per cardiology, patient should be on warfarin through the end of November. Anticoagulation has  been held this admission for left kidney biopsy on 11/2. Pharmacy consulted for heparin dosing.   Plans noted to start heparin gtt x 24 hours, then transition to warfarin if Hgb stable. -heparin level < 0.1, hg= 8.2 (last was 8.9)  11/7 AM update:  Heparin level low but trending up  Goal of Therapy:  Heparin level 0.2-0.5 units/ml Monitor platelets by anticoagulation protocol: Yes   Plan:  -Inc heparin to 1550 units/hr -1300 heparin level  Narda Bonds, PharmD, BCPS Clinical Pharmacist Phone: [phone]

## 2021-04-23 NOTE — Progress Notes (Signed)
Mobility Specialist: Progress Note   04/23/21 1640  Mobility  Activity Refused mobility   Pt refused mobility, no reason specified.   [facility] Johnnie Moten Mobility Specialist Mobility Specialist Phone: [phone]

## 2021-04-23 NOTE — Progress Notes (Signed)
FPTS Brief Progress Note  S:Patient resting comfortably in the bed. Nurse reports no complaints, say's patient is doing well.    O: BP 125/84   Pulse 86   Temp 98.6 F (37 C) (Oral)   Resp 15   Ht 5\' 9"  (1.753 m)   Wt 64.5 kg   SpO2 99%   BMI 21.00 kg/m     A/P: History of tricuspid valve endocarditis, MRSA bacteremia s/p TVR -Plan per day team   - Orders reviewed. Labs for AM ordered, which was adjusted as needed.    Holley Bouche, MD 04/23/2021, 1:43 AM PGY-1, [facility] Night Resident  Please page [phone] with questions.

## 2021-04-23 NOTE — Progress Notes (Signed)
Heparin stopped per verbal order from Dr Jeani Hawking. Orders placed for STAT H&H by MD. Will continue to monitor. Jessie Foot, RN

## 2021-04-24 ENCOUNTER — Inpatient Hospital Stay (HOSPITAL_COMMUNITY): Payer: Medicaid - Out of State

## 2021-04-24 DIAGNOSIS — R310 Gross hematuria: Secondary | ICD-10-CM

## 2021-04-24 DIAGNOSIS — I509 Heart failure, unspecified: Secondary | ICD-10-CM | POA: Diagnosis not present

## 2021-04-24 DIAGNOSIS — D649 Anemia, unspecified: Secondary | ICD-10-CM

## 2021-04-24 DIAGNOSIS — I5021 Acute systolic (congestive) heart failure: Secondary | ICD-10-CM | POA: Diagnosis not present

## 2021-04-24 LAB — BASIC METABOLIC PANEL
Anion gap: 10 (ref 5–15)
BUN: 34 mg/dL — ABNORMAL HIGH (ref 6–20)
CO2: 19 mmol/L — ABNORMAL LOW (ref 22–32)
Calcium: 8.4 mg/dL — ABNORMAL LOW (ref 8.9–10.3)
Chloride: 108 mmol/L (ref 98–111)
Creatinine, Ser: 2.31 mg/dL — ABNORMAL HIGH (ref 0.44–1.00)
GFR, Estimated: 28 mL/min — ABNORMAL LOW (ref >60)
Glucose, Bld: 88 mg/dL (ref 70–99)
Potassium: 3.9 mmol/L (ref 3.5–5.1)
Sodium: 137 mmol/L (ref 135–145)

## 2021-04-24 LAB — IMMUNOFIXATION ELECTROPHORESIS
IgA: 383 mg/dL — ABNORMAL HIGH (ref 87–352)
IgG (Immunoglobin G), Serum: 1509 mg/dL (ref 586–1602)
IgM (Immunoglobulin M), Srm: 219 mg/dL — ABNORMAL HIGH (ref 26–217)
Total Protein ELP: 5.9 g/dL — ABNORMAL LOW (ref 6.0–8.5)

## 2021-04-24 LAB — CBC
HCT: 22.6 % — ABNORMAL LOW (ref 36.0–46.0)
Hemoglobin: 7.3 g/dL — ABNORMAL LOW (ref 12.0–15.0)
MCH: 29.8 pg (ref 26.0–34.0)
MCHC: 32.3 g/dL (ref 30.0–36.0)
MCV: 92.2 fL (ref 80.0–100.0)
Platelets: 256 10*3/uL (ref 150–400)
RBC: 2.45 MIL/uL — ABNORMAL LOW (ref 3.87–5.11)
RDW: 17.1 % — ABNORMAL HIGH (ref 11.5–15.5)
WBC: 5 10*3/uL (ref 4.0–10.5)
nRBC: 0 % (ref 0.0–0.2)

## 2021-04-24 LAB — PREPARE RBC (CROSSMATCH)

## 2021-04-24 LAB — HEPARIN LEVEL (UNFRACTIONATED): Heparin Unfractionated: <0.1 IU/mL — ABNORMAL LOW (ref 0.30–0.70)

## 2021-04-24 LAB — HEMOGLOBIN AND HEMATOCRIT, BLOOD
HCT: 30.5 % — ABNORMAL LOW (ref 36.0–46.0)
HCT: 30.6 % — ABNORMAL LOW (ref 36.0–46.0)
Hemoglobin: 10.4 g/dL — ABNORMAL LOW (ref 12.0–15.0)
Hemoglobin: 10.3 g/dL — ABNORMAL LOW (ref 12.0–15.0)

## 2021-04-24 LAB — PROTIME-INR
INR: 1.1 (ref 0.8–1.2)
Prothrombin Time: 14 seconds (ref 11.4–15.2)

## 2021-04-24 MED ORDER — SODIUM CHLORIDE 0.9% IV SOLUTION: Freq: Once | INTRAVENOUS | Status: AC

## 2021-04-24 MED ORDER — HYDRALAZINE HCL 25 MG PO TABS
25.0000 mg | ORAL_TABLET | Freq: Three times a day (TID) | ORAL | Status: DC
  Administered 2021-04-24 – 2021-04-25 (×4): 25 mg via ORAL
  Filled 2021-04-24 (×4): qty 1

## 2021-04-24 MED ORDER — FUROSEMIDE 10 MG/ML IJ SOLN
60.0000 mg | Freq: Once | INTRAMUSCULAR | Status: AC
  Administered 2021-04-24: 60 mg via INTRAVENOUS
  Filled 2021-04-24: qty 6

## 2021-04-24 MED ORDER — ACETAMINOPHEN 325 MG PO TABS: 650.0000 mg | ORAL_TABLET | Freq: Four times a day (QID) | ORAL | Status: DC | PRN

## 2021-04-24 NOTE — Progress Notes (Signed)
FPTS Brief Progress Note  S:Patient resting comfortably in bed. Nursing had no complaints or issues.    O: BP 138/88 (BP Location: Right Arm)   Pulse 83   Temp 98.9 F (37.2 C) (Oral)   Resp 19   Ht 5\' 9"  (1.753 m)   Wt 64 kg   SpO2 98%   BMI 20.84 kg/m     A/P: History of tricuspid valve endocarditis MRSA bacteremia s/p TVR Continue plans per day team  - Orders reviewed. Labs for AM ordered, which was adjusted as needed.    Holley Bouche, MD 04/24/2021, 2:51 AM PGY-1, [facility] Night Resident  Please page [phone] with questions.

## 2021-04-24 NOTE — Progress Notes (Addendum)
[facility] Progress Note  Name: Robyn Shaffer MRN: [id number] DOB: December 03, 1985  Chief Complaint:  Hematuria  Subjective:  [name] feels well this morning. States that she has not had any hematuria today. She is thankful to know about the biopsy results. She has noticed her blood pressure has been elevated but denies any symptoms of chest pain, headache or vision changes. She states she was only on Metoprolol for her blood pressure prior to the hospitalization.   Review of systems:  Denies hematuria, chest pain, headache, vision changes, abdominal pain, shortness of breath, swelling, lightheaded, dizzy.  Positive for cough.   No intake or output data in the 24 hours ending 04/24/21 0808  Vitals:  Vitals:   04/23/21 1620 04/23/21 1651 04/23/21 2213 04/24/21 0544  BP: (!) 134/93 (!) 134/93 138/88 (!) 146/109  Pulse: 85 85 83 79  Resp:  18 19 15   Temp:  97.6 F (36.4 C) 98.9 F (37.2 C) 98 F (36.7 C)  TempSrc:  Oral Oral Oral  SpO2:      Weight:    63.6 kg  Height:         Physical Exam:  General: Awake, alert, oriented, in no acute distress, pleasant and cooperative with examination HEENT: Normocephalic, atraumatic, nares patent Cardio: RRR without murmur, 2+ radial, DP pulses b/l, trace edema b/l lower extremities  Respiratory: Normal work of breathing. Scant expiratory wheeze. Coarse crackles b/l bases  Abdomen: Soft, non-tender to palpation of all quadrants, non-distended, no rebound/guarding MSK: Able to move all extremities spontaneously, no deformities Neuro: Speech is clear and intact, no focal deficits, no facial asymmetry, follows commands  Psych: Normal mood and affect  Medications reviewed   Labs:  BMP Latest Ref Rng & Units 04/24/2021 04/23/2021 04/22/2021  Glucose 70 - 99 mg/dL 88 93 89  BUN 6 - 20 mg/dL 34(H) 34(H) 31(H)  Creatinine 0.44 - 1.00 mg/dL 2.31(H) 2.41(H) 2.44(H)  Sodium 135 - 145 mmol/L 137 137 136  Potassium 3.5 - 5.1 mmol/L  3.9 3.8 3.8  Chloride 98 - 111 mmol/L 108 109 107  CO2 22 - 32 mmol/L 19(L) 19(L) 22  Calcium 8.9 - 10.3 mg/dL 8.4(L) 8.3(L) 8.4(L)    Assessment/Plan:  Robyn Shaffer is a 35 y.o. 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   Non-oliguric AKI 2/2 Peri-infectious GN: Biopsy resulted with C3+ diffuse proliferative GN. Appears to be self-resolving. Creatinine is slightly improved from yesterday, 2.41>2.31. Continue supportive care, no role for immunosuppressants at this time.   Hx of endocarditis with TVR: Continue doxycycline  Normocytic Anemia: Hgb 7.7>7.3, receiving 1U PRBC transfusion. Received a dose of Aranesp 40 mcg yesterday. Iron panel with normal iron and iron saturation, slightly low TIBC. Follow up post-transfusion H&H. Monitor for signs/symptoms of continued bleed.   HTN: Amlodipine increased 10 mg yesterday. Continue Carvedilol. BP appears mildly improved but still with elevated diastolic readings in the 38G-665L. She is asymptomatic.   Acute on Chronic HFmrEF: Appears improved/euvolemic.   Hx of drug use: on Suboxone.    [name], DO 04/24/2021 8:08 AM  Seen and examined independently.  Agree with note and exam as documented above by [name] and as noted here.  General adult female n bed in no acute distress. Appears older  than stated age 47 normocephalic atraumatic extraocular movements intact sclera anicteric Neck supple trachea midline Lungs clear to auscultation bilaterally normal work of breathing at rest  Heart regular rate and rhythm no rubs or gallops appreciated Abdomen soft nontender nondistended Extremities trace edema  Psych normal mood and affect Neuro  - awake and conversant provides hx  AKI  - 2/2 perinfectious GN.  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  No immunosuppression   HTN - lasix 60 mg IV once now. Start hydralazine 25 mg TID   Normocytic Anemia - agree with PRBC and s/p aranesp once  Disposition - watching for improvement in anemia.  Getting PRBC's. nephrology arranging follow-up with [name] at [facility] post discharge.    [name], MD 04/24/2021  10:23 AM

## 2021-04-24 NOTE — Progress Notes (Signed)
Family Medicine Teaching Service Daily Progress Note Intern Pager: [phone]  Patient name: Robyn Shaffer Medical record number: [id number] Date of birth: 1986-01-02 Age: 36 y.o. Gender: female  Primary Care Provider: Patient, No Pcp Per (Inactive) Consultants: Nephrology, IR, cardiology (signed off), ID Code Status: Full  Pt Overview and Major Events to Date:  10/30: Patient admitted to FMTS 11/2: Left Renal US-guided biopsy 11/8: Transfused 1 unit PRBC.  Obtained renal ultrasound for continued drop in hemoglobin.  Assessment and Plan: Patient is a 35 year old female admitted for acute hypoxic respiratory failure 2/2 CHF exacerbation treated with diuresis (now resolved), frank hematuria s/p renal biopsy, and AKI in the setting of CKD stage IIIb. 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  Per cardiology, patient to start warfarin when hemoglobin in the appropriate range, and should remain on warfarin through the end of November 2022.  Will remain on doxycycline x6 months for chronic suppression (03/30/2021 to 09/2021).  Patient remains afebrile.  Hemoglobin this morning 7.3; normocytic anemia with MCV 92.2. - Continue to hold heparin d/t decreased hemoglobin; will transfuse and reassess -Will hold off on anticoagulation during remainder of admission.   Normocytic anemia Hgb 7.3, decreased from 7.7 - Ordered 1 unit PRBC - Nephrology gave 1 dose ESA (Aranesp 40 mcg) on 11/7, and transfusion held; no improvement in Hgb, so we will initiate transfusion today. - Repeat CBC this afternoon - Transfusion threshold Hgb less than 8.0; continue to monitor with daily CBC.  Perinephric Hematoma 2/2 continued decrease in Hgb, will obtain Renal U/S. Per IR: If worsened, consider arterial  embolization. -Patient denies all pain, so Tylenol 650 mg q6h moved to PRN  HTN 24 hr BP 134-176/88-117; After IV Lasix- 128/86 -Norvasc increased to 10 mg -Continue Coreg 12.5 mg BID -Per nephrology, Initiate hydralazine 25 mg TID and one time Lasix 60 mg IV  Nonoliguric AKI/current DPGN H/o nephrotic syndrome Per renal biopsy, found to be peri-infectious glomerulonephritis, C3+ DPGN w/ 46% crescents c/w peri-infectious GN.  UOP not reported over the past 24 hours; Cr downtrending-2.31 from 2.41. - Nephrology following, greatly appreciate their care.  Patient to follow up outpatient with Dr. Osborne Casco in 2 to 3 weeks post discharge. - Monitor daily I/O, daily weights - Avoid nephrotoxic agents - D/C anticoagulation until able to get PCP or cardiology follow-up.  History of opioid use disorder - TOC provided patient with outpatient substance use resources. -Continue nicotine patch 21 mg daily - Continue Suboxone 8/2 sublingual tablet daily   Acute HFmrERF exacerbation, resolved Patient asymptomatic.  Cardiology signed off.  No longer scheduled diuresis. - Per nephrology, Lasix 40 mg as needed weight gain or pedal edema; advised to watch weight closely upon discharge. - Patient will need outpatient cardiology follow-up  FEN/GI: Heart healthy PPx: SCDs Dispo: Home pending clinical improvement  Subjective:  Patient feels well today with no complaints.  Denies lightheadedness, headache, chest pain, dyspnea, and other symptoms of fluid overload.  Hematuria improving.  Objective: Temp:  [97.6 F (36.4 C)-98.9 F (37.2 C)] 98 F (36.7 C) (11/08 0544) Pulse Rate:  [77-85] 79 (11/08 0544) Resp:  [15-19] 15 (11/08 0544) BP: (134-152)/(88-109) 146/109 (11/08 0544) SpO2:  [98 %] 98 % (11/07 1157) Weight:  [63.6 kg] 63.6 kg (11/08 0544) Physical Exam: General: Female appearing older than stated age, in no apparent  distress, sitting up in bed Cardiovascular: RRR, no abnormal heart  sounds auscultated (although she has recently had a click after S2), no murmurs appreciated Respiratory: CTA x2, normal effort on room air Abdomen: Soft, NTND Extremities: Nonedematous LE, warm, with well-healed scarring bilaterally  Laboratory: Recent Labs  Lab 04/22/21 1155 04/23/21 0400 04/23/21 0715 04/24/21 0209  WBC 6.3 5.8  --  5.0  HGB 9.8* 7.7* 7.7* 7.3*  HCT 30.2* 24.4* 23.9* 22.6*  PLT 288 281  --  256   Recent Labs  Lab 04/20/21 0414 04/21/21 0144 04/22/21 0235 04/23/21 0400 04/24/21 0209  NA 137 136 136 137 137  K 3.5 3.9 3.8 3.8 3.9  CL 104 106 107 109 108  CO2 23 21* 22 19* 19*  BUN 34* 34* 31* 34* 34*  CREATININE 2.66* 2.50* 2.44* 2.41* 2.31*  CALCIUM 8.2* 8.2* 8.4* 8.3* 8.4*  PROT 5.6* 5.6*  --   --   --   BILITOT 0.6 0.4  --   --   --   ALKPHOS 73 75  --   --   --   ALT 19 19  --   --   --   AST 38 44*  --   --   --   GLUCOSE 95 85 89 93 88   Heparin level less than 0.10   Imaging/Diagnostic Tests: Renal ultrasound pending  Rosezetta Schlatter, MD 04/24/2021, 7:32 AM PGY-1, [place] Intern pager: [phone], text pages welcome

## 2021-04-24 NOTE — Progress Notes (Signed)
AM Hgb 7.3, down trending from 7.7 yesterday. Discussed with attending Dr. Gwendlyn Deutscher. Will give 1 unit pRBC.   Ezequiel Essex, MD

## 2021-04-24 NOTE — Progress Notes (Signed)
Mobility Specialist Progress Note    04/24/21 1507  Mobility  Activity Ambulated in hall  Level of Assistance Independent  Assistive Device None  Distance Ambulated (ft) 715 ft  Mobility Ambulated independently in hallway  Mobility Response Tolerated well  Mobility performed by Mobility specialist  $Mobility charge 1 Mobility   During Mobility: 93 HR Post-Mobility: 85 HR  Pt received in bed and agreeable. No complaints on walk. Returned to sitting EOB with call bell in reach.   Hildred Alamin Mobility Specialist  Mobility Specialist Phone: [phone]

## 2021-04-25 ENCOUNTER — Encounter (HOSPITAL_COMMUNITY): Payer: Self-pay | Admitting: Student in an Organized Health Care Education/Training Program

## 2021-04-25 DIAGNOSIS — J810 Acute pulmonary edema: Secondary | ICD-10-CM | POA: Diagnosis not present

## 2021-04-25 DIAGNOSIS — R319 Hematuria, unspecified: Secondary | ICD-10-CM | POA: Diagnosis not present

## 2021-04-25 DIAGNOSIS — I509 Heart failure, unspecified: Secondary | ICD-10-CM | POA: Diagnosis not present

## 2021-04-25 DIAGNOSIS — R0600 Dyspnea, unspecified: Secondary | ICD-10-CM | POA: Diagnosis not present

## 2021-04-25 LAB — CBC
HCT: 27.9 % — ABNORMAL LOW (ref 36.0–46.0)
HCT: 30 % — ABNORMAL LOW (ref 36.0–46.0)
Hemoglobin: 9.3 g/dL — ABNORMAL LOW (ref 12.0–15.0)
Hemoglobin: 10.2 g/dL — ABNORMAL LOW (ref 12.0–15.0)
MCH: 29.5 pg (ref 26.0–34.0)
MCH: 30.3 pg (ref 26.0–34.0)
MCHC: 33.3 g/dL (ref 30.0–36.0)
MCHC: 34 g/dL (ref 30.0–36.0)
MCV: 88.6 fL (ref 80.0–100.0)
MCV: 89 fL (ref 80.0–100.0)
Platelets: 280 10*3/uL (ref 150–400)
Platelets: 290 10*3/uL (ref 150–400)
RBC: 3.15 MIL/uL — ABNORMAL LOW (ref 3.87–5.11)
RBC: 3.37 MIL/uL — ABNORMAL LOW (ref 3.87–5.11)
RDW: 16.8 % — ABNORMAL HIGH (ref 11.5–15.5)
RDW: 16.6 % — ABNORMAL HIGH (ref 11.5–15.5)
WBC: 5.3 10*3/uL (ref 4.0–10.5)
WBC: 5.4 10*3/uL (ref 4.0–10.5)
nRBC: 0 % (ref 0.0–0.2)
nRBC: 0 % (ref 0.0–0.2)

## 2021-04-25 LAB — PROTIME-INR
INR: 1.1 (ref 0.8–1.2)
Prothrombin Time: 13.9 seconds (ref 11.4–15.2)

## 2021-04-25 LAB — BPAM RBC
Blood Product Expiration Date: 202212012359
ISSUE DATE / TIME: 202211080814
Unit Type and Rh: 5100

## 2021-04-25 LAB — TYPE AND SCREEN
ABO/RH(D): O POS
Antibody Screen: NEGATIVE
Unit division: 0

## 2021-04-25 LAB — SURGICAL PATHOLOGY

## 2021-04-25 MED ORDER — FUROSEMIDE 40 MG PO TABS
40.0000 mg | ORAL_TABLET | ORAL | Status: DC
  Administered 2021-04-25 – 2021-04-27 (×2): 40 mg via ORAL
  Filled 2021-04-25 (×2): qty 1

## 2021-04-25 NOTE — Nursing Note (Signed)
Multiple attempts to contact patient for an STS quality review follow up after cardiac surgery.  After no answer and finding consistent documentation in the chart that she has not been reachable I utilized social media to contact the patient validated by her profile matching her home town information and she was active as of October 4th. Waverly Ferrari, RN  04/25/2021, 16:04

## 2021-04-25 NOTE — Progress Notes (Addendum)
[facility] Progress Note  Name: Robyn Shaffer MRN: [id number] DOB: 22-Aug-1985  Chief Complaint:  Hematuria  Subjective:  Robyn Shaffer feels well this morning. She notes that she did not have any episodes of hematuria yesterday, though, when talking with the primary team, it appears that the nurse had mentioned to them that she had 2L of frank hematuria yesterday. She denies feelings of dizziness or lightheadedness. States she was able to get up out of bed yesterday without difficulty. She has no pain.   Review of systems:  Per HPI    Intake/Output Summary (Last 24 hours) at 04/25/2021 0807 Last data filed at 04/24/2021 1257 Gross per 24 hour  Intake 665.67 ml  Output 2000 ml  Net -1334.33 ml    Vitals:  Vitals:   04/24/21 1344 04/24/21 1626 04/24/21 2100 04/25/21 0442  BP: 134/86 (!) 153/103 (!) 143/97 (!) 141/97  Pulse: 77 85 88 82  Resp: 14 15 19 17   Temp: 98.2 F (36.8 C) 97.8 F (36.6 C) 98.4 F (36.9 C) 98.2 F (36.8 C)  TempSrc: Oral Axillary Oral Oral  SpO2:  98% 100% 100%  Weight:      Height:         Physical Exam:  General: Awake, alert, oriented, in no acute distress, pleasant and cooperative with examination. Appears older than stated age  54: Normocephalic, atraumatic, nares patent Cardio: RRR without murmur, 2+ radial, pulses b/l, trace pitting edema to upper shin b/l Respiratory: Normal work of breathing, coarse crackles b/l lower bases  Abdomen: Soft, normal active bowel sounds, non-distended Psych: Normal mood and affect  Medications reviewed   Labs:  BMP Latest Ref Rng & Units 04/24/2021 04/23/2021 04/22/2021  Glucose 70 - 99 mg/dL 88 93 89  BUN 6 - 20 mg/dL 34(H) 34(H) 31(H)  Creatinine 0.44 - 1.00 mg/dL 2.31(H) 2.41(H) 2.44(H)  Sodium 135 - 145 mmol/L 137 137 136  Potassium 3.5 - 5.1 mmol/L 3.9 3.8 3.8  Chloride 98 - 111 mmol/L 108 109 107  CO2 22 - 32 mmol/L 19(L) 19(L) 22  Calcium 8.9 - 10.3 mg/dL 8.4(L) 8.3(L) 8.4(L)    Assessment/Plan:  ALFA LEIBENSPERGER is a 35 y.o. female with PMHx significant for heroin use disorder now in remission, infective endocarditis s/p TVR who was admitted for ongoing hematuria and acute hypoxic respiratory failure in the setting of peri-infectious GN and heart failure exacerbation.   AKI 2/2 Peri-infectious GN: Creatinine has been stable. Can assess labs again outpatient to ensure continued improvement. Continue supportive care.   Normocytic Anemia: Hgb 7.3>1UPRBC>9.3. Stable. Renal U/S with stable mild pelviectasis of right kidney but no hematoma was appreciated. Primary has consulted IR to examine biopsy site due to concern for possible bleed given her frank hematuria and the perinephric hematoma that was noted s/p biopsy.  HTN: On Carvedilol 12.5 mg BID, Amlodipine 10 mg and hydralazine 25 mg TID. S/p one dose of 60 mg IV lasix yesterday with 2L UOP. BP are improved, ranging 347-425 systolic and 95-63 diastolic. Can follow up outpatient for continued management.   Acute on Chronic HFmrEF: Appears improved  Hx of Endocarditis with TVR: continue Doxycycline for suppression  Hx of drug use: on Suboxone.   Robyn Settler, DO 04/25/2021 8:07 AM  Seen and examined independently.  Agree with note and exam as documented above by [name] and as noted here.    General adult female in bed in no acute distress HEENT normocephalic atraumatic extraocular movements intact sclera anicteric  Neck supple trachea midline Lungs clear to auscultation bilaterally normal work of breathing at rest  Heart S1S2 no rub Abdomen soft nontender nondistended Extremities trace edema  Psych normal mood and affect  She states that she specifically didn't have any blood in her urine - she states RN may have misunderstand a statement about a prior issue  AKI 2/2 periinfectious GN  - continue supportive care; no role for immunosuppression at this time   HTN - stop hydralazine  - Would start  Lasix 40 mg PO three times a week as needed for swelling   Hx endocarditis  - on doxycycline for suppression   Hx of drug use - states that she is committed to cessation; living with mother now   She gives me the following phone numbers:  Pt cell (sometimes is off due to pre-paid) [phone] Robyn Shaffer (mother)   Nephrology will sign off.  Will ensure she has follow-up with [facility] - Dr. Kate Sable, MD 04/25/2021 10:53 AM

## 2021-04-25 NOTE — Progress Notes (Signed)
FPTS Brief Progress Note  S:Patient resting comfortably in bed. No complaint or concerns from the nurse.    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 Labs for AM ordered, which was adjusted as needed.    Holley Bouche, MD 04/25/2021, 1:45 AM PGY-1, [facility] Night Resident  Please page [phone] with questions.

## 2021-04-25 NOTE — Progress Notes (Signed)
OT Cancellation Note  Patient Details Name: Robyn Shaffer MRN: [id number] DOB: 09/09/1985   Cancelled Treatment:    Reason Eval/Treat Not Completed: OT screened, no needs identified, will sign off. Pt is functioning independently as she was on 10/31. No OT needs.   [name] 04/25/2021, 11:03 AM Nestor Lewandowsky, OTR/L Acute Rehabilitation Services Pager: [phone] Office: [phone]

## 2021-04-25 NOTE — Progress Notes (Signed)
Referring Physician(s): FMTS  Supervising Physician: Ruthann Cancer  Patient Status:  [facility] - In-pt  Chief Complaint: Hematuria  Subjective:  Patient sitting in bed using cell phone, she denies any hematuria yesterday or today. She denies any flank pain, abdominal pain, n/v, dizziness, headache or trouble breathing.   Allergies: Vancomycin  Medications: Prior to Admission medications   Medication Sig Start Date End Date Taking? Authorizing Provider  B Complex-C-Folic Acid (VIRT-CAPS) 1 MG CAPS Take 1 mg by mouth daily. 03/03/21  Yes [provider]  famotidine (PEPCID) 20 MG tablet Take 20 mg by mouth daily. 03/03/21  Yes [provider]  furosemide (LASIX) 40 MG tablet Take 40 mg by mouth daily. 03/13/21  Yes [provider]  hydrOXYzine (VISTARIL) 50 MG capsule Take 50 mg by mouth 4 (four) times daily as needed for anxiety. 03/03/21  Yes [provider]  linezolid (ZYVOX) 600 MG tablet Take 600 mg by mouth daily.   Yes [provider]  melatonin 3 MG TABS tablet Take 6 mg by mouth at bedtime. 03/03/21  Yes [provider]  metoprolol tartrate (LOPRESSOR) 25 MG tablet Take 25 mg by mouth 2 (two) times daily. 03/03/21  Yes [provider]  naproxen (NAPROSYN) 500 MG tablet Take 500 mg by mouth 2 (two) times daily as needed for mild pain.   Yes [provider]  QUEtiapine (SEROQUEL) 50 MG tablet Take 50 mg by mouth at bedtime. 03/03/21  Yes [provider]  rifampin (RIFADIN) 300 MG capsule Take 300 mg by mouth daily.   Yes [provider]  sevelamer carbonate (RENVELA) 800 MG tablet Take 800 mg by mouth 3 (three) times daily with meals.   Yes [provider]  warfarin (COUMADIN) 2 MG tablet Take 3 mg by mouth daily. 03/15/21  Yes [provider]  doxycycline (VIBRA-TABS) 100 MG tablet Take 100 mg by mouth 2 (two) times daily. 03/28/21   [provider]     Vital Signs: BP  (!) 139/91 (BP Location: Right Arm)   Pulse 82   Temp 98 F (36.7 C) (Oral)   Resp 14   Ht 5\' 9"  (1.753 m)   Wt 140 lb 3.2 oz (63.6 kg)   SpO2 100%   BMI 20.70 kg/m   Physical Exam Vitals and nursing note reviewed.  Constitutional:      General: She is not in acute distress. HENT:     Head: Normocephalic.  Cardiovascular:     Rate and Rhythm: Normal rate.  Pulmonary:     Effort: Pulmonary effort is normal.  Abdominal:     Palpations: Abdomen is soft.  Skin:    General: Skin is warm and dry.     Coloration: Skin is not pale.  Neurological:     Mental Status: She is alert.  Psychiatric:        Mood and Affect: Mood normal.        Behavior: Behavior normal.        Thought Content: Thought content normal.        Judgment: Judgment normal.    Imaging: US RENAL  Result Date: 04/24/2021 CLINICAL DATA:  Anemia, renal biopsy 04/18/2021 EXAM: RENAL / URINARY TRACT ULTRASOUND COMPLETE COMPARISON:  04/15/2021 FINDINGS: Right Kidney: Renal measurements: 12.4 x 5.0 x 7.5 cm = volume: 242 mL. Normal cortical thickness. Increased cortical echogenicity. No mass or shadowing calcification. Mild pelviectasis noted, unchanged. Left Kidney: Renal measurements: 16.0 x 5.3 x 5.3 cm =  volume: 234 mL. Normal cortical thickness. Increased cortical echogenicity. No mass, hydronephrosis, or shadowing calcification. Bladder: Contains only a small amount of urine, no gross abnormality seen. Other: No perinephric collection/hematoma identified IMPRESSION: Medical renal disease changes of both kidneys. Stable mild pelviectasis of RIGHT kidney. Electronically Signed   By: Lavonia Dana M.D.   On: 04/24/2021 15:17    Labs:  CBC: Recent Labs    04/22/21 1155 04/23/21 0400 04/23/21 0715 04/24/21 0209 04/24/21 1435 04/24/21 1601 04/25/21 0401  WBC 6.3 5.8  --  5.0  --   --  5.3  HGB 9.8* 7.7*   < > 7.3* 10.4* 10.3* 9.3*  HCT 30.2* 24.4*   < > 22.6* 30.5* 30.6* 27.9*  PLT 288 281  --  256  --   --   280   < > = values in this interval not displayed.    COAGS: Recent Labs    04/22/21 1836 04/23/21 0400 04/24/21 0209 04/25/21 0401  INR 1.0 1.1 1.1 1.1    BMP: Recent Labs    04/21/21 0144 04/22/21 0235 04/23/21 0400 04/24/21 0209  NA 136 136 137 137  K 3.9 3.8 3.8 3.9  CL 106 107 109 108  CO2 21* 22 19* 19*  GLUCOSE 85 89 93 88  BUN 34* 31* 34* 34*  CALCIUM 8.2* 8.4* 8.3* 8.4*  CREATININE 2.50* 2.44* 2.41* 2.31*  GFRNONAA 25* 26* 26* 28*    LIVER FUNCTION TESTS: Recent Labs    04/15/21 1027 04/16/21 0217 04/20/21 0414 04/21/21 0144  BILITOT 0.8 0.7 0.6 0.4  AST 40 31 38 44*  ALT 31 25 19 19   ALKPHOS 89 74 73 75  PROT 7.0 5.5* 5.6* 5.6*  ALBUMIN 2.5* 1.9* 2.0* 2.0*    Assessment and Plan:  35 y/o F with asymptomatic perinephric hematoma following random renal biopsy 11/2 in IR seen today due to new onset hematuria and drop in hgb requiring 1 unit PRBCs. IR has been asked to see patient for possible angiogram and embolization.  Patient reports no further hematuria in over 24 hours, she has been feeling well with no overt s/s of ongoing bleeding. She has no abdominal or flank pain. She was previously tolerating diet without issue.  CBC at 0401 today with stable hgb at 9.3 (10.3 > 10.4) and hct at 27.9 (30.6 > 30.5), plt improved to 280 (previously 256). Afebrile, BP 125/78, HR 72, RR 13, spO2 100% on RA. Renal US yesterday showed no perinephric hematoma.  Given patient remains hemodynamically stable with appropriate response to transfusion and without further hematuria in over 24 hours no current indication for angiogram with possible embolization. Recommendation has been made to [facility] to repeat H/H this afternoon - IR will follow up on these results however no plans for intervention unless there is significant drop in hgb with hemodynamic instability.   Electronically Signed: Joaquim Nam, PA-C 04/25/2021, 11:14 AM   I spent a total of 25 Minutes at  the the patient's bedside AND on the patient's hospital floor or unit, greater than 50% of which was counseling/coordinating care for hematuria.

## 2021-04-25 NOTE — Progress Notes (Signed)
Family Medicine Teaching Service Daily Progress Note Intern Pager: [phone]  Patient name: Robyn Shaffer Medical record number: [id number] Date of birth: [birth year] Age: 35 y.o. Gender: female  Primary Care Provider: Patient, No Pcp Per (Inactive) Consultants: Nephrology (signed off), IR, cardiology (signed off), ID Code Status: Full  Pt Overview and Major Events to Date:  10/30: Patient admitted to FMTS 11/2: Left Renal US-guided biopsy 11/8: Transfused 1 unit PRBC.  Obtained renal ultrasound for continued drop in hemoglobin.  Assessment and Plan: Patient is a 35 year old female admitted for acute hypoxic respiratory failure with 2/2 CHF exacerbation treated with diuresis (now resolved), frank hematuria s/p renal biopsy, and AKI the setting of CKD stage IIIb.  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  Per cardiology, patient to start warfarin when hemoglobin in the appropriate range, and should remain on warfarin through the end of November 2022.  Will remain on doxycycline x6 months for chronic suppression (03/30/2021 to 09/2021).  Patient remains afebrile.  Hemoglobin this morning 9.3; normocytic anemia with MCV 88.6. - Continue to hold heparin due to decreased hemoglobin; we will hold off on anticoagulation during the remainder of admission.  Perinephric hematoma No longer present on renal ultrasound 11/8; patient voided 2 L hematuria on 11/8. - Consult IR to consider arterial embolization, per previous recommendation; appreciate current recs. - Patient continues to deny pain, Tylenol 650 mg every 6 hours available as needed  HTN 24-hour BP 141-153/97-103, most recent 139/91 - Continue Norvasc 10 mg - Continue Coreg 12.5 mg twice daily - Continue Lasix 40 mg PO 3 times daily PRN  edema  Normocytic anemia Hgb 9.3, decreased from posttransfusion Hgb 10.3. 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  UOP 2 L hematuria over the past 24 hours; Cr yesterday 2.31. - Nephrology following, greatly appreciate their care.  Patient to follow up with [name] in 2 to 3 weeks post discharge. - Monitor daily I/O, daily weights - Avoid nephrotoxic agents  History of opioid use disorder - [facility] provided patient with outpatient substance use resources - Continue nicotine patch 21 mg daily - Continue Suboxone 8/2 sublingual tablet daily  Acute HFmrERF exacerbation, resolved Patient asymptomatic.  Cardiology signed off.  No longer receiving scheduled diuresis - Per nephrology, Lasix 40 mg as needed weight gain or pedal edema; advised to watch weight closely upon discharge. - Patient will need outpatient cardiology follow-up.   FEN/GI: NPO pending IR evaluation; then to return to [facility] PPx: SCDs Dispo: Home pending clinical improvement  Subjective:  Patient denies pain and denies hematuria; she reports that the commode given by her RN had blood previously in it.   Objective: Temp:  [97.8 F (36.6 C)-98.4 F (36.9 C)] 98.2 F (36.8 C) (11/09 0442) Pulse Rate:  [67-88] 82 (11/09 0442) Resp:  [14-19] 17 (11/09 0442) BP: (121-176)/(75-117) 141/97 (11/09 0442) SpO2:  [98 %-100 %] 100 % (11/09 0442) Physical Exam: General: Female appearing older than stated age, in no apparent distress, sitting up in bed Cardiovascular: RRR, no abnormal heart sounds, no murmurs appreciated Respiratory: CTA x 2; normal  effort on room air Abdomen: Soft, NTND Extremities: Nonedematous LE, warm, with well-healed  scarring bilaterally  Laboratory: Recent Labs  Lab 04/23/21 0400 04/23/21 0715 04/24/21 0209 04/24/21 1435 04/24/21 1601 04/25/21 0401  WBC 5.8  --  5.0  --   --  5.3  HGB 7.7*   < > 7.3* 10.4* 10.3* 9.3*  HCT 24.4*   < > 22.6* 30.5* 30.6* 27.9*  PLT 281  --  256  --   --  280   < > = values in this interval not displayed.   Recent Labs  Lab 04/20/21 0414 04/21/21 0144 04/22/21 0235 04/23/21 0400 04/24/21 0209  NA 137 136 136 137 137  K 3.5 3.9 3.8 3.8 3.9  CL 104 106 107 109 108  CO2 23 21* 22 19* 19*  BUN 34* 34* 31* 34* 34*  CREATININE 2.66* 2.50* 2.44* 2.41* 2.31*  CALCIUM 8.2* 8.2* 8.4* 8.3* 8.4*  PROT 5.6* 5.6*  --   --   --   BILITOT 0.6 0.4  --   --   --   ALKPHOS 73 75  --   --   --   ALT 19 19  --   --   --   AST 38 44*  --   --   --   GLUCOSE 95 85 89 93 88     Imaging/Diagnostic Tests: RENAL / URINARY TRACT ULTRASOUND COMPLETE   FINDINGS: Right Kidney:  Renal measurements: 12.4 x 5.0 x 7.5 cm = volume: 242 mL. Normal cortical thickness. Increased cortical echogenicity. No mass or shadowing calcification. Mild pelviectasis noted, unchanged.   Left Kidney:  Renal measurements: 16.0 x 5.3 x 5.3 cm = volume: 234 mL. Normal cortical thickness. Increased cortical echogenicity. No mass, hydronephrosis, or shadowing calcification.   Bladder:  Contains only a small amount of urine, no gross abnormality seen.   Other:  No perinephric collection/hematoma identified   IMPRESSION: Medical renal disease changes of both kidneys. Stable mild pelviectasis of RIGHT kidney.    Electronically Signed   By: Lavonia Dana M.D.   On: 04/24/2021 15:17  Rosezetta Schlatter, MD 04/25/2021, 7:23 AM PGY-1, [facility] Intern pager: [phone], text pages welcome

## 2021-04-25 NOTE — Evaluation (Signed)
Physical Therapy Evaluation Patient Details Name: Robyn Shaffer MRN: [id number] DOB: 26-Mar-1986 Today's Date: 04/25/2021  History of Present Illness  35 yo female presenting to ED on 10/30 with shortness of breath. 11/2: Left Renal US-guided biopsy. 11/8: Transfused 1 unit PRBC. Scheduled for kidney ultrasound 11/9 PMH including endocarditis, kidney infection, polysubstance abuse, and mitral valve replacement.  Clinical Impression  Pt evaluated on 10/31 by PT and mobility has improved since that time. Pt is independent with mobility. Pt scored 24/24 on DGI and is at low risk of fall. PT has no recommendations for further services or equipment needs. PT signing off.       Recommendations for follow up therapy are one component of a multi-disciplinary discharge planning process, led by the attending physician.  Recommendations may be updated based on patient status, additional functional criteria and insurance authorization.  Follow Up Recommendations No PT follow up    Assistance Recommended at Discharge PRN  Functional Status Assessment Patient has not had a recent decline in their functional status  Equipment Recommendations  None recommended by PT    Recommendations for Other Services       Precautions / Restrictions Precautions Precautions: None Restrictions Weight Bearing Restrictions: No      Mobility  Bed Mobility Overal bed mobility: Independent                  Transfers Overall transfer level: Modified independent                      Ambulation/Gait Ambulation/Gait assistance: Modified independent (Device/Increase time) Gait Distance (Feet): 1200 Feet Assistive device: None Gait Pattern/deviations: Step-through pattern;WFL(Within Functional Limits) Gait velocity: WFL     General Gait Details: strong, steady ambulation       Balance                                 Standardized Balance Assessment Standardized Balance  Assessment : Dynamic Gait Index   Dynamic Gait Index Level Surface: Normal Change in Gait Speed: Normal Gait with Horizontal Head Turns: Normal Gait with Vertical Head Turns: Normal Gait and Pivot Turn: Normal Step Over Obstacle: Normal Step Around Obstacles: Normal Steps: Normal Total Score: 24       Pertinent Vitals/Pain Pain Assessment: No/denies pain    Home Living Family/patient expects to be discharged to:: Private residence Living Arrangements: Parent Available Help at Discharge: Family;Available 24 hours/day Type of Home: House Home Access: Level entry       Home Layout: One level Home Equipment: None      Prior Function Prior Level of Function : Independent/Modified Independent               ADLs Comments: Does ADLs and IADLs. Not working or driving. Mother assists as well     Hand Dominance   Dominant Hand: Right    Extremity/Trunk Assessment   Upper Extremity Assessment Upper Extremity Assessment: Overall WFL for tasks assessed    Lower Extremity Assessment Lower Extremity Assessment: Overall WFL for tasks assessed    Cervical / Trunk Assessment Cervical / Trunk Assessment: Normal  Communication   Communication: No difficulties  Cognition Arousal/Alertness: Awake/alert Behavior During Therapy: WFL for tasks assessed/performed Overall Cognitive Status: Within Functional Limits for tasks assessed  General Comments: frustrated about staying another day for ultrasound but doesn't want to have to come back to hospital        General Comments General comments (skin integrity, edema, etc.): rest HR 75 BP 123/78 max noted HR 96, after ambulation HR 76bpm, BP 139/80        Assessment/Plan    PT Assessment Patient does not need any further PT services         PT Goals (Current goals can be found in the Care Plan section)  Acute Rehab PT Goals Patient Stated Goal: go home PT Goal  Formulation: All assessment and education complete, DC therapy     AM-PAC PT "6 Clicks" Mobility  Outcome Measure Help needed turning from your back to your side while in a flat bed without using bedrails?: None Help needed moving from lying on your back to sitting on the side of a flat bed without using bedrails?: None Help needed moving to and from a bed to a chair (including a wheelchair)?: None Help needed standing up from a chair using your arms (e.g., wheelchair or bedside chair)?: None Help needed to walk in hospital room?: None Help needed climbing 3-5 steps with a railing? : None 6 Click Score: 24    End of Session   Activity Tolerance: Patient tolerated treatment well Patient left: with call bell/phone within reach;in bed Nurse Communication: Mobility status PT Visit Diagnosis: Other abnormalities of gait and mobility (R26.89)    Time: 8413-2440 PT Time Calculation (min) (ACUTE ONLY): 16 min   Charges:   PT Evaluation $PT Eval Moderate Complexity: 1 Mod          Arman Loy B. Migdalia Dk PT, DPT Acute Rehabilitation Services Pager [phone] Office [phone]   [place] 04/25/2021, 10:41 AM

## 2021-04-25 NOTE — Progress Notes (Signed)
Mobility Specialist Progress Note    04/25/21 1200  Mobility  Activity Ambulated in hall  Level of Assistance Independent after set-up  Assistive Device None  Distance Ambulated (ft) 480 ft  Mobility Ambulated independently in hallway  Mobility Response Tolerated well  Mobility performed by Mobility specialist  $Mobility charge 1 Mobility   Pt received sitting EOB and agreeable. No complaints on walk. Returned to sitting EOB with call bell in reach.   Hildred Alamin Mobility Specialist  Mobility Specialist Phone: [phone]

## 2021-04-26 ENCOUNTER — Other Ambulatory Visit (HOSPITAL_COMMUNITY): Payer: Self-pay

## 2021-04-26 DIAGNOSIS — D6489 Other specified anemias: Secondary | ICD-10-CM

## 2021-04-26 DIAGNOSIS — I509 Heart failure, unspecified: Secondary | ICD-10-CM | POA: Diagnosis not present

## 2021-04-26 DIAGNOSIS — F1910 Other psychoactive substance abuse, uncomplicated: Secondary | ICD-10-CM

## 2021-04-26 DIAGNOSIS — R319 Hematuria, unspecified: Secondary | ICD-10-CM | POA: Diagnosis not present

## 2021-04-26 DIAGNOSIS — R058 Other specified cough: Secondary | ICD-10-CM | POA: Diagnosis not present

## 2021-04-26 DIAGNOSIS — J810 Acute pulmonary edema: Secondary | ICD-10-CM | POA: Diagnosis not present

## 2021-04-26 LAB — URINALYSIS, MICROSCOPIC (REFLEX)
RBC / HPF: >50 RBC/hpf (ref 0–5)
Squamous Epithelial / HPF: NONE SEEN (ref 0–5)

## 2021-04-26 LAB — URINALYSIS, ROUTINE W REFLEX MICROSCOPIC
Bilirubin Urine: NEGATIVE
Glucose, UA: NEGATIVE mg/dL
Ketones, ur: NEGATIVE mg/dL
Leukocytes,Ua: NEGATIVE
Nitrite: NEGATIVE
Protein, ur: 100 mg/dL — AB
Specific Gravity, Urine: 1.015 (ref 1.005–1.030)
pH: 6.5 (ref 5.0–8.0)

## 2021-04-26 LAB — CBC
HCT: 27.3 % — ABNORMAL LOW (ref 36.0–46.0)
Hemoglobin: 9.2 g/dL — ABNORMAL LOW (ref 12.0–15.0)
MCH: 29.9 pg (ref 26.0–34.0)
MCHC: 33.7 g/dL (ref 30.0–36.0)
MCV: 88.6 fL (ref 80.0–100.0)
Platelets: 250 10*3/uL (ref 150–400)
RBC: 3.08 MIL/uL — ABNORMAL LOW (ref 3.87–5.11)
RDW: 16.3 % — ABNORMAL HIGH (ref 11.5–15.5)
WBC: 5.4 10*3/uL (ref 4.0–10.5)
nRBC: 0 % (ref 0.0–0.2)

## 2021-04-26 LAB — PROTIME-INR
INR: 1.1 (ref 0.8–1.2)
Prothrombin Time: 14.3 seconds (ref 11.4–15.2)

## 2021-04-26 LAB — CULTURE, BLOOD (ROUTINE X 2)
Culture: NO GROWTH
Culture: NO GROWTH

## 2021-04-26 LAB — HEMOGLOBIN AND HEMATOCRIT, BLOOD
HCT: 27.8 % — ABNORMAL LOW (ref 36.0–46.0)
HCT: 32.3 % — ABNORMAL LOW (ref 36.0–46.0)
Hemoglobin: 9.3 g/dL — ABNORMAL LOW (ref 12.0–15.0)
Hemoglobin: 10.6 g/dL — ABNORMAL LOW (ref 12.0–15.0)

## 2021-04-26 LAB — TYPE AND SCREEN
ABO/RH(D): O POS
Antibody Screen: NEGATIVE

## 2021-04-26 MED ORDER — FUROSEMIDE 40 MG PO TABS
40.0000 mg | ORAL_TABLET | ORAL | 0 refills | Status: DC
  Filled 2021-04-26: qty 14, 33d supply, fill #0

## 2021-04-26 MED ORDER — MUPIROCIN 2 % EX OINT
TOPICAL_OINTMENT | Freq: Two times a day (BID) | CUTANEOUS | 0 refills | Status: AC
  Filled 2021-04-26: qty 22, 30d supply, fill #0

## 2021-04-26 MED ORDER — DOXYCYCLINE HYCLATE 100 MG PO TABS
100.0000 mg | ORAL_TABLET | Freq: Two times a day (BID) | ORAL | 0 refills | Status: DC
Start: 2021-04-26 — End: 2021-05-22
  Filled 2021-04-26: qty 60, 30d supply, fill #0

## 2021-04-26 MED ORDER — NICOTINE 21 MG/24HR TD PT24
21.0000 mg | MEDICATED_PATCH | Freq: Every day | TRANSDERMAL | 0 refills | Status: AC
  Filled 2021-04-26: qty 28, 28d supply, fill #0

## 2021-04-26 MED ORDER — BUPRENORPHINE HCL-NALOXONE HCL 8-2 MG SL SUBL
1.0000 | SUBLINGUAL_TABLET | Freq: Every day | SUBLINGUAL | 0 refills | Status: DC
Start: 2021-04-27 — End: 2021-06-01
  Filled 2021-04-26: qty 30, 30d supply, fill #0

## 2021-04-26 MED ORDER — HYDROXYZINE HCL 25 MG PO TABS
50.0000 mg | ORAL_TABLET | Freq: Three times a day (TID) | ORAL | 0 refills | Status: AC
  Filled 2021-04-26: qty 60, 10d supply, fill #0

## 2021-04-26 MED ORDER — AMLODIPINE BESYLATE 10 MG PO TABS
10.0000 mg | ORAL_TABLET | Freq: Every day | ORAL | 0 refills | Status: DC
  Filled 2021-04-26: qty 30, 30d supply, fill #0

## 2021-04-26 MED ORDER — CARVEDILOL 12.5 MG PO TABS
12.5000 mg | ORAL_TABLET | Freq: Two times a day (BID) | ORAL | 0 refills | Status: DC
  Filled 2021-04-26: qty 60, 30d supply, fill #0

## 2021-04-26 NOTE — Discharge Instructions (Addendum)
Follow up with Cardiology on 11/18 at 8:25 AM at [facility] for management of your anticoagulation and antibiotics (until 09/28/21) for your MRSA endocarditis.  Follow-up with your primary care provider for monitoring of your hemoglobin and medications.  Schedule your follow-up appointment with nephrology 1-2 weeks after discharge with [name] at [facility].  Continue to take all medications as prescribed. Avoid use of alcohol or illegal drugs with your medications.   Should an emergency arise or you experience worsening of your symptoms (blood in urine, dizziness, weakness, headaches, shortness of breath, chest pain), call 911 or visit the nearest ED.

## 2021-04-26 NOTE — Plan of Care (Signed)

## 2021-04-26 NOTE — Discharge Summary (Addendum)
[facility] Discharge Summary  Patient name: Robyn Shaffer Medical record number: [id number] Date of birth: December 24, 1985 Age: 35 y.o. 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 Hypertension Normocytic anemia Diffuse Proliferative Glomerulonephritis/ Nonoliguric AKI History of opioid use disorder  Resolved during this hospitalization: Perinephric hematoma Acute HFmrEF   Disposition: Home  Discharge Condition: Stable  Discharge Exam:  General: Female appearing older than stated age, in no apparent distress, sitting up in bed Cardiovascular: RRR, no abnormal heart sounds, no murmurs appreciated Respiratory: CTA x2; normal effort on room air Abdomen: Soft, NTND Extremities: Nonedematous lower extremities, warm, no SCDs in place at this time Neurological: A and O x4; no weakness Psychiatric: Normal mood and affect  Brief Hospital Course:  Robyn Shaffer is a 35 y.o. female admitted for acute hypoxic respiratory failure 2/2 CHF exacerbation, frank hematuria s/p renal biopsy, and AKI in the setting of CKD stage IIIb.  PMH is significant for MRSA endocarditis and bacteremia s/p Tricuspid valve replacement, septic pulmonary emboli, nephrotic syndrome, and IVDU currently on Suboxone.   Acute hypoxic respiratory failure secondary to acute CHF exacerbation Patient was admitted for 1 day history of dyspnea, worse with laying supine. On arrival to ED she was tachycardic, tachypneic, and hypertensive. Echo from [facility] admission, 02/26/21, showed EF 62.6%. CXR showed cardiomegaly and small  pleural effusion. BNP was greater that 4,500 and troponin was slightly elevated to 34. On exam patient had diffuse crackles, intermittent wheezing, abdominal distress, 2+ BLE edema and no JVD. Echo this admission showed LVEF of 45-50%. Patient was treated with Lasix until fluid status was euvolemic. Patient weight was 71.2 kg on admission and was 61.5 kg by discharge. By discharge patient respiratory status was improved.    Hx of Endocarditis s/p bioprosthetic Tricuspid valve replacement Patient had Tricuspid valve replacement (02/13/21) for endocarditis with MRSA bacteremia at [facility] where she was admitted from 02/05/21 - 916/22. Hospital course was complicated by pleural effusion, pneumothorax, and multiple septic emboli in bilateral lungs. On discharge she was supposed to take warfarin for 3 months (through the end of November 2022), but she had run out of warfarin days before admission. During admission, anticoagulation was started, but discontinued because of decreasing hemoglobin, hematuria, and perinephric hematoma. Patient also taking Doxycycline 100 mg BID, started 03/30/21 due to last 6 months (09/28/20).   Hematuria   AKI  Nephrotic Kidney Disease  CKD IIIb On admission patients Cr was 2.11 and GFR of 32. Cr 2 months prior was 1.43. Hematuria began around September, is painless and not associated with menses. Patient was supposed to receive renal biopsy at [facility], but left AMA. 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 Patient also developed perinephric hematoma from the renal biopsy. On repeat Renal U/S, perinephric hematoma not present, so IR consulted. IR saw no acute signs of instability, so advised to continue to monitor.   Anemia Hgb on admission was 9.0, with a transfusion threshold of 8. Patient's hemoglobin dropped to 7.7, and nephrology gave a dosage of Aranesp 40 mcg. Repeat  Hgb was 7.3, and patient transfused  1 unit pRBC on 11/8. Repeat Hgb on day of discharge 9.5.   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 She reports she's been clean for 2 months. UDS was positive for amphetamines. Patient remained on Suboxone during admission, and will continue upon discharge.  Hypertension  On Carvedilol 12.5 mg BID, Amlodipine 10 mg. Per nephrology, hydralazine d/c and Lasix 40 mg PO three times a week for HTN and edema.    Issues for PCP f/u  Patient was switched from metoprolol to Carvedilol, please confirm she is still taking this.  Follow-up on Hgb/HCT. Re-evaluate to consider restarting anticoagulation. Continue doxycycline until 09/28/2021, 6 months total treatment regimen. Will ensure she has follow-up with [facility] - [name].    Significant Procedures: Renal biopsy: complicated by asymptomatic postprocedural perinephric hematoma. Results: C3+ DPGN with 46% crescents.  Significant Labs and Imaging:  Recent Labs  Lab 04/25/21 1619 04/26/21 0239 04/26/21 0830 04/26/21 1249 04/27/21 0317  WBC 5.4 5.4  --   --  5.6  HGB 10.2* 9.2* 9.3* 10.6* 9.5*  HCT 30.0* 27.3* 27.8* 32.3* 28.2*  PLT 290 250  --   --  260   Recent Labs  Lab 04/20/21 1748 04/21/21 0144 04/21/21 0144 04/22/21 0235 04/23/21 0400 04/24/21 0209  NA  --  136  --  136 137 137  K  --  3.9   < > 3.8 3.8 3.9  CL  --  106  --  107 109 108  CO2  --  21*  --  22 19* 19*  GLUCOSE  --  85  --  89 93 88  BUN  --  34*  --  31* 34* 34*  CREATININE  --  2.50*  --  2.44* 2.41* 2.31*  CALCIUM  --  8.2*  --  8.4* 8.3* 8.4*  MG 1.8 2.1  --   --   --   --   ALKPHOS  --  75  --   --   --   --   AST  --  44*  --   --   --   --   ALT  --  19  --   --   --   --   ALBUMIN  --  2.0*  --   --   --   --    < > = values in this interval not displayed.     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   buprenorphine-naloxone 8-2 mg Subl SL tablet Commonly known as: SUBOXONE Place 1 tablet under the tongue  daily.   carvedilol 12.5 MG tablet Commonly known as: COREG Take 1 tablet (12.5 mg total) by mouth 2 (two) times daily with a meal.   doxycycline 100 MG tablet Commonly known as: VIBRA-TABS Take 1 tablet (100 mg total) by mouth every 12 (twelve) hours. What changed: when to take this   famotidine 20 MG tablet Commonly known as: PEPCID Take 20 mg by mouth daily.   furosemide 40 MG tablet Commonly known as: LASIX Take 1 tablet (40 mg total) by mouth every Monday, Wednesday, and Friday. What changed: when to take this   hydrOXYzine 25 MG tablet Commonly known as: ATARAX/VISTARIL Take 2 tablets (50 mg total) by mouth 3 (three) times daily.   melatonin 3 MG Tabs tablet Take 6 mg by mouth at bedtime.   mupirocin ointment 2 % Commonly known as: BACTROBAN Apply topically 2 (two) times daily.   nicotine 21 mg/24hr patch Commonly known as: NICODERM CQ - dosed in mg/24 hours Place 1 patch (21 mg total) onto the skin daily.   QUEtiapine 50 MG tablet Commonly known as: SEROQUEL Take 50 mg by mouth at bedtime.   Virt-Caps 1 MG Caps Take 1 mg by  mouth daily.        Discharge Instructions: Please refer to Patient Instructions section of EMR for full details.  Patient was counseled important signs and symptoms that should prompt return to medical care, changes in medications, dietary instructions, activity restrictions, and follow up appointments.   Follow-Up Appointments:  Follow-up [place] Follow up on 05/22/2021.   Why: @ 9:30 am for hospital follow up appointment with Dr. Wynetta Emery. If you cannot make this scheduled appointment; please call the office to reschedule. Contact information: [place] [address] [phone]        [name], MD. Schedule an appointment as soon as possible for a visit in 1 week(s).   Specialty: Internal Medicine Why: Make an appointment with the nephrologist (kidney doctor) for 1-2 weeks after discharge. Contact information: [place] [address] [phone]         Belva Crome, MD. Schedule an appointment as soon as possible for a visit in 1 week(s).   Specialty: Cardiology Why: Make an appointment with the cardiologist (heart doctor) for 1-2 weeks after discharge. Contact information: [address] [place] [address] [phone]                 Rosezetta Schlatter, MD 04/27/2021, 10:45 AM PGY-1, [place] Upper-Level Resident Addendum   I have independently interviewed and examined the patient. I have discussed the above with the original author and agree with their documentation. My edits for correction/addition/clarification are in within the document. Please see also any attending notes.   Shary Key, DO PGY-2, [place] [facility] 04/30/2021 1:09 PM  FPTS Service pager: [phone] (text pages welcome through [facility])

## 2021-04-26 NOTE — Progress Notes (Signed)
Mobility Specialist Progress Note    04/26/21 1219  Mobility  Activity Ambulated in hall  Level of Assistance Independent  Assistive Device None  Distance Ambulated (ft) 700 ft  Mobility Ambulated independently in hallway  Mobility Response Tolerated well  Mobility performed by Mobility specialist  $Mobility charge 1 Mobility   Pt received in bed and agreeable. No complaints on walk. Returned to sitting EOB with call bell in reach.   Hildred Alamin Mobility Specialist  Mobility Specialist Phone: [phone]

## 2021-04-26 NOTE — Progress Notes (Signed)
FPTS Brief Progress Note  S:Patient resting comfortably in bed. Nurse reports no complaints or concerns.    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 Labs for AM ordered, which was adjusted as needed.    Holley Bouche, MD 04/26/2021, 3:21 AM PGY-1, [facility] Family Medicine Night Resident  Please page [phone] with questions.

## 2021-04-26 NOTE — Progress Notes (Signed)
Family Medicine Teaching Service Daily Progress Note Intern Pager: [phone]  Patient name: Robyn Shaffer Medical record number: [id number] Date of birth: [birth year] Age: 35 y.o. Gender: female  Primary Care Provider: Patient, No Pcp Per (Inactive) Consultants: Nephrology (signed off), IR (signed off), cardiology (signed off), ID (signed off) Code Status: Full   Pt Overview and Major Events to Date:  10/30: Patient admitted to [facility] 11/2: Left Renal US-guided biopsy 11/8: Transfused 1 unit PRBC.  Obtained renal ultrasound for continued drop in hemoglobin.    Assessment and Plan: Patient is a 35 year old female admitted for acute hypoxic respiratory failure 2/2 CHF exacerbation treated with diuresis (now resolved), frank hematuria s/p renal biopsy, and AKI in the setting of CKD stage IIIb.  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  Per cardiology, patient to start warfarin when hemoglobin in the appropriate range, and should remain on warfarin through the end of November 2022.  Will remain on doxycycline x6 months for chronic suppression (03/30/2021 to 09/28/2021).  Patient remains afebrile.  Hemoglobin this morning 9.3, and 10.6 on afternoon repeat. -Continue to hold heparin due to decreased hemoglobin; we will hold off on anticoagulation during the remainder of admission. - Nephrology gave 1 dose ESA on 11/7 - Transfusion threshold hemoglobin less than 8.0 - Cardiology follow-up 11/18 at 8:25 AM with [facility]  HTN 24-hour BP 118-131/77-80 - Continued Norvasc 10 mg daily - Continue Coreg 12.5 mg twice daily - Continue Lasix 40 mg p.o. 3 times per week  Nonoliguric AKI/current DPGN  H/o nephrotic syndrome Per renal biopsy, found to be peri-infectious  glomerulonephritis, C3 positive DPGN with 46% crescents, c/w peri-infectious GN.  24-hour UOP not documented accurately.  Repeat UA found to have greater than 50 RBC per HPF. - Patient to follow with nephrologist, [name] in 2 to 3 weeks post discharge - Continue to monitor daily I/O, daily weights - Continue to avoid nephrotoxic agents  History of opioid use disorder - TOC provided patient with outpatient substance use resources - Continue nicotine patch 21 mg daily - Continue Suboxone 8/2 sublingual tablet daily  Acute HFmrEF exacerbation, resolved Patient asymptomatic.  Cardiology signed off.  -Per nephrology, Lasix 40 mg 3 times weekly; advised to watch weight closely upon discharge   FEN/GI: Heart healthy diet PPx: SCDs Dispo: Home pending medication recs  Subjective:  Patient denies pain and denies hematuria.  She reports feeling ready to go home.  All questions were addressed, and patient has no concerns at this time.  Objective: Temp:  [98.1 F (36.7 C)-98.6 F (37 C)] 98.6 F (37 C) (11/10 1204) Pulse Rate:  [70-83] 70 (11/10 1204) Resp:  [14-16] 16 (11/10 1204) BP: (110-136)/(77-98) 110/86 (11/10 1204) SpO2:  [99 %-100 %] 100 % (11/10 1204) Weight:  [62.8 kg] 62.8 kg (11/10 0515) Physical Exam: General: Female appearing older than stated age, in no apparent distress, sitting up in bed Cardiovascular: RRR, no abnormal heart sounds, no murmurs appreciated Respiratory: CTA x2; normal effort on room air Abdomen: Soft, NTND Extremities: Nonedematous LE, warm  Laboratory: Recent Labs  Lab 04/25/21 0401 04/25/21 1619 04/26/21 0239 04/26/21 0830 04/26/21 1249  WBC 5.3 5.4 5.4  --   --   HGB 9.3* 10.2* 9.2* 9.3* 10.6*  HCT 27.9* 30.0* 27.3* 27.8* 32.3*  PLT 280  290 250  --   --    Recent Labs  Lab 04/20/21 0414 04/21/21 0144 04/22/21 0235 04/23/21 0400 04/24/21 0209  NA 137 136 136 137 137  K 3.5 3.9 3.8 3.8 3.9  CL 104 106 107 109 108  CO2 23 21* 22  19* 19*  BUN 34* 34* 31* 34* 34*  CREATININE 2.66* 2.50* 2.44* 2.41* 2.31*  CALCIUM 8.2* 8.2* 8.4* 8.3* 8.4*  PROT 5.6* 5.6*  --   --   --   BILITOT 0.6 0.4  --   --   --   ALKPHOS 73 75  --   --   --   ALT 19 19  --   --   --   AST 38 44*  --   --   --   GLUCOSE 95 85 89 93 88   Urinalysis, Microscopic (reflex) Order: [id number] Status: Final result   Visible to patient: No (scheduled for 04/26/2021  5:21 PM)     0 Result Notes Component Ref Range & Units 12:35 10 d ago 11 d ago 3 mo ago 10 yr ago  RBC / HPF 0 - 5 RBC/hpf >50  >50 High   >50  >50 High   TOO NUMEROUS TO COUNT R   WBC, UA 0 - 5 WBC/hpf 0-5  11-20  0-5  >50 High   21-50 R   Bacteria, UA NONE SEEN FEW Abnormal   RARE Abnormal   NONE SEEN CM  RARE Abnormal   MANY Abnormal  R   Squamous Epithelial / LPF 0 - 5 NONE SEEN  6-10  0-5  21-50  FEW Abnormal  R   Budding Yeast  PRESENT    PRESENT CM        Imaging/Diagnostic Tests: None new  Rosezetta Schlatter, MD 04/26/2021, 5:27 PM PGY-1, [place] Intern pager: [phone], text pages welcome

## 2021-04-27 ENCOUNTER — Other Ambulatory Visit (HOSPITAL_COMMUNITY): Payer: Self-pay

## 2021-04-27 LAB — CBC
HCT: 28.2 % — ABNORMAL LOW (ref 36.0–46.0)
Hemoglobin: 9.5 g/dL — ABNORMAL LOW (ref 12.0–15.0)
MCH: 30 pg (ref 26.0–34.0)
MCHC: 33.7 g/dL (ref 30.0–36.0)
MCV: 89 fL (ref 80.0–100.0)
Platelets: 260 10*3/uL (ref 150–400)
RBC: 3.17 MIL/uL — ABNORMAL LOW (ref 3.87–5.11)
RDW: 15.9 % — ABNORMAL HIGH (ref 11.5–15.5)
WBC: 5.6 10*3/uL (ref 4.0–10.5)
nRBC: 0 % (ref 0.0–0.2)

## 2021-04-27 LAB — PROTIME-INR
INR: 1.1 (ref 0.8–1.2)
Prothrombin Time: 13.9 seconds (ref 11.4–15.2)

## 2021-04-27 MED ORDER — DOXYCYCLINE HYCLATE 100 MG PO TABS
100.0000 mg | ORAL_TABLET | Freq: Two times a day (BID) | ORAL | 0 refills | Status: AC
Start: 2021-04-27 — End: 2021-05-27

## 2021-04-27 NOTE — Care Management (Signed)
04-27-21 1205 Late Entry: Case Manager spoke with the patient this am at 8:45 regarding medications for home and cost. Patient stated she would be able to afford her medications since she [place]. Case Manager relayed the information to [place] and the Attending. Per Family Medicine, the patient will have f/u for her Suboxone. No further needs identified at this time from Case Manager.

## 2021-04-27 NOTE — Plan of Care (Signed)

## 2021-04-27 NOTE — Progress Notes (Signed)
FPTS Brief Progress Note  S:Patient sleeping comfortably in bed. No concerns or complaints from nurse.   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 Labs for AM ordered, which was adjusted as needed.    Holley Bouche, MD 04/27/2021, 3:23 AM PGY-1, [place] [facility] Night Resident  Please page [phone] with questions.

## 2021-04-27 NOTE — Plan of Care (Signed)
  Problem: Education: Goal: Knowledge of General Education information will improve Description: Including pain rating scale, medication(s)/side effects and non-pharmacologic comfort measures 04/27/2021 1331 by Drucie Ip I, RN Outcome: Adequate for Discharge 04/27/2021 1326 by Drucie Ip I, RN Outcome: Adequate for Discharge   Problem: Health Behavior/Discharge Planning: Goal: Ability to manage health-related needs will improve 04/27/2021 1331 by Drucie Ip I, RN Outcome: Adequate for Discharge 04/27/2021 1326 by Drucie Ip I, RN Outcome: Adequate for Discharge   Problem: Clinical Measurements: Goal: Ability to maintain clinical measurements within normal limits will improve 04/27/2021 1331 by Drucie Ip I, RN Outcome: Adequate for Discharge 04/27/2021 1326 by Drucie Ip I, RN Outcome: Adequate for Discharge Goal: Will remain free from infection 04/27/2021 1331 by Drucie Ip I, RN Outcome: Adequate for Discharge 04/27/2021 1326 by Drucie Ip I, RN Outcome: Adequate for Discharge Goal: Diagnostic test results will improve 04/27/2021 1331 by Drucie Ip I, RN Outcome: Adequate for Discharge 04/27/2021 1326 by Drucie Ip I, RN Outcome: Adequate for Discharge Goal: Respiratory complications will improve 04/27/2021 1331 by Drucie Ip I, RN Outcome: Adequate for Discharge 04/27/2021 1326 by Drucie Ip I, RN Outcome: Adequate for Discharge Goal: Cardiovascular complication will be avoided 04/27/2021 1331 by Drucie Ip I, RN Outcome: Adequate for Discharge 04/27/2021 1326 by Drucie Ip I, RN Outcome: Adequate for Discharge   Problem: Activity: Goal: Risk for activity intolerance will decrease 04/27/2021 1331 by Drucie Ip I, RN Outcome: Adequate for Discharge 04/27/2021 1326 by Drucie Ip I, RN Outcome: Adequate for Discharge   Problem: Nutrition: Goal: Adequate  nutrition will be maintained 04/27/2021 1331 by Drucie Ip I, RN Outcome: Adequate for Discharge 04/27/2021 1326 by Drucie Ip I, RN Outcome: Adequate for Discharge   Problem: Coping: Goal: Level of anxiety will decrease 04/27/2021 1331 by Drucie Ip I, RN Outcome: Adequate for Discharge 04/27/2021 1326 by Drucie Ip I, RN Outcome: Adequate for Discharge   Problem: Elimination: Goal: Will not experience complications related to bowel motility 04/27/2021 1331 by Drucie Ip I, RN Outcome: Adequate for Discharge 04/27/2021 1326 by Drucie Ip I, RN Outcome: Adequate for Discharge Goal: Will not experience complications related to urinary retention 04/27/2021 1331 by Drucie Ip I, RN Outcome: Adequate for Discharge 04/27/2021 1326 by Drucie Ip I, RN Outcome: Adequate for Discharge   Problem: Pain Managment: Goal: General experience of comfort will improve 04/27/2021 1331 by Drucie Ip I, RN Outcome: Adequate for Discharge 04/27/2021 1326 by Drucie Ip I, RN Outcome: Adequate for Discharge   Problem: Safety: Goal: Ability to remain free from injury will improve 04/27/2021 1331 by Drucie Ip I, RN Outcome: Adequate for Discharge 04/27/2021 1326 by Drucie Ip I, RN Outcome: Adequate for Discharge   Problem: Skin Integrity: Goal: Risk for impaired skin integrity will decrease 04/27/2021 1331 by Drucie Ip I, RN Outcome: Adequate for Discharge 04/27/2021 1326 by Drucie Ip I, RN Outcome: Adequate for Discharge

## 2021-05-04 ENCOUNTER — Ambulatory Visit (HOSPITAL_BASED_OUTPATIENT_CLINIC_OR_DEPARTMENT_OTHER): Payer: Medicaid - Out of State | Admitting: Family

## 2021-05-04 NOTE — Progress Notes (Deleted)
Office Visit    Patient Name: Robyn Shaffer Date of Encounter: 05/04/2021  PCP:  Patient, No Pcp Per (Inactive)   [place]  Cardiologist:  None *** Advanced Practice Provider:  No care team member to display Electrophysiologist:  None      Chief Complaint    Robyn Shaffer is a 35 y.o. 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  She was admitted to [facility] 02/06/2019 - 03/02/2021 for endocarditis with MRSA bacteremia and she was treated with tricuspid valve replacement.  Hospital course was complicated by pleural effusion, pneumothorax, multiple septic emboli in bilateral lungs.  She was discharged with warfarin for 3 months however ran out prior to her most recent admission.  Was admitted 04/15/2021 - 04/27/2021 due to acute hypoxic respiratory failure in setting of heart failure exacerbation.  Anticoagulation initiated given recent admission with septic emboli but had to be discontinued due to hematuria, perinephric hematoma.  Per ID she was recommended for doxycycline 100 mg twice daily starting 03/30/2021 to the last 6 months (end date 09/28/2020).  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  Post biopsy she did have perinephric hematoma and IR recommended for continued monitoring.***  EKGs/Labs/Other Studies Reviewed:   The following studies were reviewed today: *** Echo 04/16/21 1. Left ventricular ejection fraction, by estimation, is 45 to 50%. The  left ventricle has mildly decreased function. The left ventricle  demonstrates global hypokinesis. There is mild concentric left ventricular  hypertrophy. Left ventricular diastolic  function could not be evaluated.   2. Right ventricular systolic function is normal. The right ventricular  size is normal. Tricuspid regurgitation signal is inadequate for assessing  PA pressure.   3. The pericardial effusion is anterior to the right ventricle.   4. The mitral valve is normal in structure. Trivial mitral valve  regurgitation. No evidence of mitral stenosis.   5. The tricuspid valve is has been repaired/replaced.   6. The aortic valve is tricuspid. Aortic valve regurgitation is trivial.  Mild aortic valve sclerosis is present, with no evidence of aortic valve  stenosis. Aortic valve mean gradient measures 3.0 mmHg. Aortic valve Vmax  measures 1.16 m/s.   7. The inferior vena cava is dilated in size with <50% respiratory  variability, suggesting right atrial pressure of 15 mmHg.   8. No prior images to compare side by side. Compared to report of Aug  2022, LVF appears to have declined.    Echo 02/26/2021 Findings  Left Ventricle:   Normal left ventricular size. Normal geometry. Left ventricular systolic function is normal. Ejection Fraction is  61.2 %. No segmental/regional wall motion abnormalities identified.  Left ventricular diastolic parameters are normal.  Right Ventricle:   Normal right ventricular size. Normal right ventricular systolic function. Right ventricular systolic pressure is indeterminate.  Left Atrium:   The left atrium is normal in size.  Right Atrium:   The right atrium is of normal  size.  Mitral Valve:   The mitral valve is not well visualized.  Tricuspid Valve:   A 33 mm bioprosthesis is present in the tricuspid position. There is normal leaflet motion of the tricuspid prosthesis. No paravalvular tricuspid regurgitation present.  Aortic Valve:   The mean gradient across the aortic valve is 4.0 mmHg . No Aortic valve stenosis.  Pulmonic Valve:   The pulmonic valve is normal.  Pulmonary Artery:   The pulmonary artery appears normal.  Atrial Septum:   Agitated saline bubble study is negative for intracardiac shunt.  IVC/Hepatic Veins:   Normal IVC size with >50% inspiratory collapse (estimated RA pressure 3 mmHg).  Aorta:   The aortic root is of normal size. The ascending aorta is normal in size.  Pericardium/Pleural space:   There is a trivial pericardial effusion.  EKG:  EKG is  ordered today.  The ekg ordered today demonstrates ***  Recent Labs: 04/15/2021: B Natriuretic Peptide >4,500.0 04/20/2021: TSH 4.079 04/21/2021: ALT 19; Magnesium 2.1 04/24/2021: BUN 34; Creatinine, Ser 2.31; Potassium 3.9; Sodium 137 04/27/2021: Hemoglobin 9.5; Platelets 260  Recent Lipid Panel    Component Value Date/Time   CHOL 155 04/21/2021 1143   TRIG 172 (H) 04/21/2021 1143   HDL 32 (L) 04/21/2021 1143   CHOLHDL 4.8 04/21/2021 1143   VLDL 34 04/21/2021 1143   LDLCALC 89 04/21/2021 1143    Risk Assessment/Calculations:  {Does this patient have ATRIAL FIBRILLATION?:[phone]}  Home Medications   No outpatient medications have been marked as taking for the 05/04/21 encounter (Appointment) with Loel Dubonnet, NP.     Review of Systems   ***   All other systems reviewed and are otherwise negative except as noted above.  Physical Exam    VS:  There were no vitals taken for this visit. , BMI There is no height or weight on file to calculate BMI.  Wt Readings from Last 3 Encounters:  04/27/21 135 lb 9.6 oz (61.5 kg)  01/22/21 157 lb (71.2 kg)  04/15/19 158 lb (71.7 kg)      GEN: Well nourished, well developed, in no acute distress. HEENT: normal. Neck: Supple, no JVD, carotid bruits, or masses. Cardiac: ***RRR, no murmurs, rubs, or gallops. No clubbing, cyanosis, edema.  ***Radials/PT 2+ and equal bilaterally.  Respiratory:  ***Respirations regular and unlabored, clear to auscultation bilaterally. GI: Soft, nontender, nondistended. MS: No deformity or atrophy. Skin: Warm and dry, no rash. Neuro:  Strength and sensation are intact. Psych: Normal affect.  Assessment & Plan    CHF - Echo 02/26/21 EF 62%. Echo 04/2021 LVEF 45-50%. ***  History of endocarditis s/p bioprosthetic tricuspid valve replacement 02/13/2021 at [facility] -   CKD -   Anemia -   Polysubstance use -   Hypertension -   Disposition: Follow up {follow up:15908} with None or APP.  Signed, Loel Dubonnet, NP 05/04/2021, 7:59 AM [place]

## 2021-05-08 ENCOUNTER — Encounter (HOSPITAL_COMMUNITY): Payer: Self-pay

## 2021-05-22 ENCOUNTER — Other Ambulatory Visit: Payer: Self-pay

## 2021-05-22 ENCOUNTER — Ambulatory Visit: Payer: Medicaid - Out of State | Attending: Internal Medicine | Admitting: Internal Medicine

## 2021-05-22 ENCOUNTER — Telehealth: Payer: Self-pay

## 2021-05-22 ENCOUNTER — Encounter: Payer: Self-pay | Admitting: Internal Medicine

## 2021-05-22 VITALS — BP 160/100 | HR 74 | Resp 16 | Ht 67.0 in | Wt 145.4 lb

## 2021-05-22 DIAGNOSIS — F1911 Other psychoactive substance abuse, in remission: Secondary | ICD-10-CM | POA: Insufficient documentation

## 2021-05-22 DIAGNOSIS — Z954 Presence of other heart-valve replacement: Secondary | ICD-10-CM

## 2021-05-22 DIAGNOSIS — N059 Unspecified nephritic syndrome with unspecified morphologic changes: Secondary | ICD-10-CM

## 2021-05-22 DIAGNOSIS — N1832 Chronic kidney disease, stage 3b: Secondary | ICD-10-CM

## 2021-05-22 DIAGNOSIS — Z7689 Persons encountering health services in other specified circumstances: Secondary | ICD-10-CM

## 2021-05-22 DIAGNOSIS — F17200 Nicotine dependence, unspecified, uncomplicated: Secondary | ICD-10-CM

## 2021-05-22 DIAGNOSIS — I5022 Chronic systolic (congestive) heart failure: Secondary | ICD-10-CM

## 2021-05-22 DIAGNOSIS — Z8679 Personal history of other diseases of the circulatory system: Secondary | ICD-10-CM

## 2021-05-22 DIAGNOSIS — I10 Essential (primary) hypertension: Secondary | ICD-10-CM

## 2021-05-22 DIAGNOSIS — D649 Anemia, unspecified: Secondary | ICD-10-CM

## 2021-05-22 MED ORDER — VIRT-CAPS 1 MG PO CAPS
1.0000 mg | ORAL_CAPSULE | Freq: Every day | ORAL | 1 refills | Status: AC
  Filled 2021-05-22: qty 100, fill #0

## 2021-05-22 MED ORDER — FUROSEMIDE 40 MG PO TABS
40.0000 mg | ORAL_TABLET | ORAL | 4 refills | Status: AC
  Filled 2021-05-22 – 2021-06-25 (×2): qty 12, 28d supply, fill #0
  Filled 2021-07-23: qty 12, 28d supply, fill #1
  Filled 2021-07-23 (×2): qty 12, 28d supply, fill #0

## 2021-05-22 MED ORDER — CARVEDILOL 12.5 MG PO TABS
12.5000 mg | ORAL_TABLET | Freq: Two times a day (BID) | ORAL | 3 refills | Status: DC
  Filled 2021-05-22: qty 60, 30d supply, fill #0

## 2021-05-22 MED ORDER — DOXYCYCLINE HYCLATE 100 MG PO TABS
100.0000 mg | ORAL_TABLET | Freq: Two times a day (BID) | ORAL | 0 refills | Status: AC
  Filled 2021-05-22: qty 60, 30d supply, fill #0

## 2021-05-22 MED ORDER — AMLODIPINE BESYLATE 10 MG PO TABS
10.0000 mg | ORAL_TABLET | Freq: Every day | ORAL | 3 refills | Status: AC
  Filled 2021-05-22: qty 30, 30d supply, fill #0

## 2021-05-22 NOTE — Telephone Encounter (Addendum)
Patient left the clinic before this CM was able to meet with her regarding applying for Medicaid and suboxone   This CM spoke to Jacqlyn Krauss, RN CM who explained that Family Medicine was supposed to be setting this up suboxone for the patient.   Call placed to [facility] # [phone]  message left on clinical voicemail with call back requested to this CM.  Attempted to contact patient.  # [phone], the message indicates this is Zenda Alpers - voicemail full ,unable to leave a message. Calls also placed to # [phone] and [phone] and the recording stated that the call could not be completed at this time.  Dr Wynetta Emery sent patient's new prescriptions for [facility].  As per Georgiann Mohs, [facility], the order is not ready for patient yet.  He will leave a note with the order for patient to contact this CM when she picks up her medications.   Call received from Hannah,nurse/Family Medicine who explained that the patient has not been seen in their clinic and they do not manage suboxone.

## 2021-05-22 NOTE — Progress Notes (Signed)
Patient ID: Robyn Shaffer, female    DOB: Oct 26, 1985  MRN: [id number]  CC: Hospitalization Follow-up   Subjective: Robyn Shaffer is a 35 y.o. female who presents for new pt visit and hosp f/u Her concerns today include:  Patient with history of polysub abuse (EtOH, cocaine, IVDA and opioid abuse), HTN, tob dep, diffuse proliferative GN with CKD 3, MRSA endocarditis status post bioprosthetic tricuspid valve replacement 01/2021 at [facility] (hospital course was complicated with pleural effusion, pneumothorax, multiple septic emboli BL lungs.  She was supposed to take warfarin for 3 months post surgery through the end of November 2022), CHFrEF  Relocated recently from [place].  Patient hospitalized 10/30-11/04/2021 with acute hypoxic respiratory failure and hematuria x1 month. -Hypoxic respiratory failure secondary to CHF.  Echo with LVEF of 45 to 50%.  Previous echo done 02/2021 at [facility] showed EF of 62%.  Patient diuresed. -Patient was supposed to be on Coumadin for 3 months post hospitalization in September a few.  However she ran out of Coumadin shortly before this hospitalization.  Restarted on Coumadin during the hospitalization but it was discontinued due to drop in hemoglobin, hematuria and perinephritic hematoma post kidney bx.  Creatinine 2.1 with GFR of 32.  CR 2 months ago 1.43.  Renal biopsy done revealed diffuse proliferative GN felt to be consistent with peri-infectious GN.  Perinephritic hematoma resolved on renal U/S by the time of discharge.  Transfused 1 unit PRBC with increase in hemoglobin from 7.3 to 9.5 -Patient on Suboxone for polysubstance abuse.  UDS was positive for amphetamines. -On carvedilol, amiodarone furosemide 40 mg 3 times per week.  Plan is for her to follow-up with cardiology and nephrology  Today: CHF/HTN: Weight has increased 10 pounds since hospital discharge.  She is out of furosemide and  needs refill on carvedilol and.  She has not taken the carvedilol or  Norvasc as yet for the morning.. Denies any increased shortness of breath, no PND or lower extremity edema.  No appointment scheduled with cardiology as yet.  She is in the process of trying to have her Medicaid transferred from [place] to [place].  CKD 3/glomerulonephritis: No hematuria since being home.  Not discharged on Coumadin.  Discharge summary advised to consider restarting Coumadin.  She had completed about 2 out of the 3 months of the Coumadin. She does not have an appointment with nephrology [name] as yet.  Reports compliance with taking doxycycline.  Plan is to have her continue the medication until 09/28/2020.  Polysubstance abuse: Reports being clean for the past 2 months.  She was started on Suboxone from [place].  She has 3 days left of Suboxone.  States she was told that I would be able to prescribe it for her.  She does not have any information about Suboxone programs that may be available in [place].  Currently living with her mother in [place]. Smokes about half a pack of cigarettes per day.  She is smoked for 23 years.  She is not ready to quit.  Patient Active Problem List   Diagnosis Date Noted   Substance abuse in remission ([place]) 05/22/2021   Tobacco dependence 05/22/2021   Stage 3b chronic kidney disease ([place]) 05/22/2021   Acute pulmonary edema (HCC)    Dyspnea    Hematuria    Acute CHF ([place]) 04/15/2021     Current Outpatient Medications on File Prior to Visit  Medication Sig Dispense Refill   buprenorphine-naloxone (SUBOXONE)  8-2 mg SUBL SL tablet Place 1 tablet under the tongue daily. 30 tablet 0   doxycycline (VIBRA-TABS) 100 MG tablet Take 1 tablet (100 mg total) by mouth every 12 (twelve) hours. 60 tablet 0   famotidine (PEPCID) 20 MG tablet Take 20 mg by mouth daily.     hydrOXYzine (ATARAX/VISTARIL) 25 MG tablet Take 2 tablets (50 mg total) by mouth 3 (three) times daily. 60 tablet 0   melatonin 3 MG TABS tablet  Take 6 mg by mouth at bedtime.     mupirocin ointment (BACTROBAN) 2 % Apply topically 2 (two) times daily. 22 g 0   nicotine (NICODERM CQ - DOSED IN MG/24 HOURS) 21 mg/24hr patch Place 1 patch (21 mg total) onto the skin daily. 28 patch 0   QUEtiapine (SEROQUEL) 50 MG tablet Take 50 mg by mouth at bedtime.     No current facility-administered medications on file prior to visit.    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    Social History   Socioeconomic History   Marital status: Legally Separated    Spouse name: Not on file   Number of children: Not on file   Years of education: Not on file   Highest education level: Not on file  Occupational History   Not on file  Tobacco Use   Smoking status: Every Day    Types: Cigarettes   Smokeless tobacco: Never  Vaping Use   Vaping Use: Unknown  Substance and Sexual Activity   Alcohol use: Not on file    Comment: rarely   Drug use: No   Sexual activity: Not on file  Other Topics Concern   Not on file  Social History Narrative   Not on file   Social Determinants of Health   Financial Resource Strain: Not on file  Food Insecurity: Not on file  Transportation Needs: Not on file  Physical Activity: Not on file  Stress: Not on file  Social Connections: Not on file  Intimate Partner Violence: Not on file    Family History  Problem Relation Age of Onset   Hypertension Mother    Hypertension Father    Heart attack Maternal Grandfather     Past Surgical History:  Procedure Laterality Date   ABDOMINAL HYSTERECTOMY     KIDNEY STONE SURGERY     MITRAL VALVE REPLACEMENT      ROS: Review of Systems Negative except as stated above  PHYSICAL EXAM: BP (!) 160/100 (BP Location: Right Arm, Patient Position: Sitting, Cuff Size: Small)   Pulse 74   Resp 16   Ht 5\' 7"  (1.702 m)   Wt 145 lb 6.4 oz (66 kg)   SpO2 99%   BMI 22.77 kg/m   Physical  Exam  General appearance -home to middle-age Caucasian female who appears chronically ill. Mental status -patient is alert and oriented.  She answers questions appropriately. Eyes - pupils equal and reactive, extraocular eye movements intact Mouth - mucous membranes moist, pharynx normal without lesions Neck - supple, no significant adenopathy Chest - clear to auscultation, no wheezes, rales or rhonchi, symmetric air entry Heart - normal rate, regular rhythm, normal S1, S2, no murmurs,  Extremities -trace to 1+ nonpitting edema of the lower one third of both legs and ankles  Depression screen [facility] 2/9 05/22/2021  Decreased Interest 0  Down, Depressed, Hopeless 0  PHQ - 2 Score 0    CMP Latest Ref  Rng & Units 04/24/2021 04/23/2021 04/22/2021  Glucose 70 - 99 mg/dL 88 93 89  BUN 6 - 20 mg/dL 34(H) 34(H) 31(H)  Creatinine 0.44 - 1.00 mg/dL 2.31(H) 2.41(H) 2.44(H)  Sodium 135 - 145 mmol/L 137 137 136  Potassium 3.5 - 5.1 mmol/L 3.9 3.8 3.8  Chloride 98 - 111 mmol/L 108 109 107  CO2 22 - 32 mmol/L 19(L) 19(L) 22  Calcium 8.9 - 10.3 mg/dL 8.4(L) 8.3(L) 8.4(L)  Total Protein 6.5 - 8.1 g/dL - - -  Total Bilirubin 0.3 - 1.2 mg/dL - - -  Alkaline Phos 38 - 126 U/L - - -  AST 15 - 41 U/L - - -  ALT 0 - 44 U/L - - -   Lipid Panel     Component Value Date/Time   CHOL 155 04/21/2021 1143   TRIG 172 (H) 04/21/2021 1143   HDL 32 (L) 04/21/2021 1143   CHOLHDL 4.8 04/21/2021 1143   VLDL 34 04/21/2021 1143   LDLCALC 89 04/21/2021 1143    CBC    Component Value Date/Time   WBC 5.6 04/27/2021 0317   RBC 3.17 (L) 04/27/2021 0317   HGB 9.5 (L) 04/27/2021 0317   HGB 12.7 11/20/2011 1429   HCT 28.2 (L) 04/27/2021 0317   HCT 36.4 11/20/2011 1429   PLT 260 04/27/2021 0317   PLT 166 11/20/2011 1429   MCV 89.0 04/27/2021 0317   MCV 88 11/20/2011 1429   MCH 30.0 04/27/2021 0317   MCHC 33.7 04/27/2021 0317   RDW 15.9 (H) 04/27/2021 0317   RDW 13.4 11/20/2011 1429   LYMPHSABS 1.6 04/15/2021  1027   MONOABS 0.4 04/15/2021 1027   EOSABS 0.1 04/15/2021 1027   BASOSABS 0.0 04/15/2021 1027    ASSESSMENT AND PLAN: 1. Establishing care with new doctor, encounter for   2. Systolic CHF, chronic (HCC) Weight is up 10 pounds since hospital discharge.  She has some lower extremity edema today but has been out of furosemide.  Refill sent on carvedilol and furosemide to our pharmacy.  Encourage low-salt diet. - Ambulatory referral to Cardiology - carvedilol (COREG) 12.5 MG tablet; Take 1 tablet (12.5 mg total) by mouth 2 (two) times daily with a meal.  Dispense: 180 tablet; Refill: 3 - furosemide (LASIX) 40 MG tablet; Take 1 tablet (40 mg total) by mouth every Monday, Wednesday, and Friday.  Dispense: 30 tablet; Refill: 4  3. Essential hypertension Not at goal.  She has not taken medicines as yet today.  She will do so once she gets her medicines filled.  Encourage compliance. - amLODipine (NORVASC) 10 MG tablet; Take 1 tablet (10 mg total) by mouth daily.  Dispense: 90 tablet; Refill: 3 - carvedilol (COREG) 12.5 MG tablet; Take 1 tablet (12.5 mg total) by mouth 2 (two) times daily with a meal.  Dispense: 180 tablet; Refill: 3  4. History of endocarditis - Ambulatory referral to Cardiology - doxycycline (VIBRA-TABS) 100 MG tablet; Take 1 tablet (100 mg total) by mouth every 12 (twelve) hours.  Dispense: 60 tablet; Refill: 0  5. Status post tricuspid valve replacement - Ambulatory referral to Cardiology  6. 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 Stage 3b chronic kidney disease ([place]) - Comprehensive metabolic panel - Ambulatory referral to Nephrology  8. Substance abuse in remission [facility]) Commended her on being drug-free for 2 months.  She has 3 days left of Suboxone.  I told her that we do  not have a license to dispense Suboxone from our clinic.  I will have our caseworker speak with her today and try  to get her into a Suboxone clinic in [place] as soon as possible to prevent relapse of IVDA  9. Tobacco dependence Advised to quit.  Patient not ready to give a trial of quitting.  10. Normocytic anemia With recent hospitalization for hematuria and subsequently developed perinephric hematoma post kidney biopsy.  Recheck CBC today.  We will make a determination once we get the results of whether to put her back on Coumadin for 1 to 2 months. - CBC    Patient was given the opportunity to ask questions.  Patient verbalized understanding of the plan and was able to repeat key elements of the plan.   Orders Placed This Encounter  Procedures   CBC   Comprehensive metabolic panel   Ambulatory referral to Nephrology   Ambulatory referral to Cardiology     Requested Prescriptions   Signed Prescriptions Disp Refills   amLODipine (NORVASC) 10 MG tablet 90 tablet 3    Sig: Take 1 tablet (10 mg total) by mouth daily.   carvedilol (COREG) 12.5 MG tablet 180 tablet 3    Sig: Take 1 tablet (12.5 mg total) by mouth 2 (two) times daily with a meal.   furosemide (LASIX) 40 MG tablet 30 tablet 4    Sig: Take 1 tablet (40 mg total) by mouth every Monday, Wednesday, and Friday.   B Complex-C-Folic Acid (VIRT-CAPS) 1 MG CAPS 100 capsule 1    Sig: Take 1 mg by mouth daily.   doxycycline (VIBRA-TABS) 100 MG tablet 60 tablet 0    Sig: Take 1 tablet (100 mg total) by mouth every 12 (twelve) hours.    Return in about 6 weeks (around 07/03/2021).  Karle Plumber, MD, FACP

## 2021-05-23 ENCOUNTER — Other Ambulatory Visit: Payer: Self-pay

## 2021-05-23 ENCOUNTER — Telehealth: Payer: Self-pay

## 2021-05-23 ENCOUNTER — Other Ambulatory Visit: Payer: Self-pay | Admitting: Internal Medicine

## 2021-05-23 LAB — CBC
Hematocrit: 25.3 % — ABNORMAL LOW (ref 34.0–46.6)
Hemoglobin: 8.4 g/dL — ABNORMAL LOW (ref 11.1–15.9)
MCH: 27.7 pg (ref 26.6–33.0)
MCHC: 33.2 g/dL (ref 31.5–35.7)
MCV: 84 fL (ref 79–97)
Platelets: 277 x10E3/uL (ref 150–450)
RBC: 3.03 x10E6/uL — ABNORMAL LOW (ref 3.77–5.28)
RDW: 12 % (ref 11.7–15.4)
WBC: 5.2 x10E3/uL (ref 3.4–10.8)

## 2021-05-23 LAB — COMPREHENSIVE METABOLIC PANEL WITH GFR
ALT: 11 IU/L (ref 0–32)
AST: 21 IU/L (ref 0–40)
Albumin/Globulin Ratio: 0.9 — ABNORMAL LOW (ref 1.2–2.2)
Albumin: 3.3 g/dL — ABNORMAL LOW (ref 3.8–4.8)
Alkaline Phosphatase: 102 IU/L (ref 44–121)
BUN/Creatinine Ratio: 14 (ref 9–23)
BUN: 28 mg/dL — ABNORMAL HIGH (ref 6–20)
Bilirubin Total: <0.2 mg/dL (ref 0.0–1.2)
CO2: 19 mmol/L — ABNORMAL LOW (ref 20–29)
Calcium: 8.8 mg/dL (ref 8.7–10.2)
Chloride: 111 mmol/L — ABNORMAL HIGH (ref 96–106)
Creatinine, Ser: 2.06 mg/dL — ABNORMAL HIGH (ref 0.57–1.00)
Globulin, Total: 3.8 g/dL (ref 1.5–4.5)
Glucose: 78 mg/dL (ref 70–99)
Potassium: 4.9 mmol/L (ref 3.5–5.2)
Sodium: 142 mmol/L (ref 134–144)
Total Protein: 7.1 g/dL (ref 6.0–8.5)
eGFR: 32 mL/min/1.73 — ABNORMAL LOW

## 2021-05-23 MED ORDER — FERROUS SULFATE 325 (65 FE) MG PO TABS
325.0000 mg | ORAL_TABLET | Freq: Every day | ORAL | 0 refills | Status: DC
  Filled 2021-05-23: qty 30, 30d supply, fill #0

## 2021-05-23 NOTE — Telephone Encounter (Signed)
Contacted pt to go over lab results pt didn't answer and was unable to lvm due to vm being full    Sent a CRM and forward labs to NT to give pt labs when they call back

## 2021-05-23 NOTE — Telephone Encounter (Signed)
Call received from patient.    Regarding Medicaid, she is waiting for the closure letter from [facility] and then she can apply for [facility] Medicaid.  She is planning to stay in this area.  Regarding suboxone, she said she has called some of the resources on the list that she was given at the hospital but she has not had any luck finding any agency to help her. She was not sure who she called except [facility] and [facility]  Explained to her that this CM will check for resources and call her back.  She provided the best number to reach her-  [phone].   Call placed to [facility] # [phone], message left with call back requested to this CM.  Call placed to [facility] # [phone], spoke to [place] who confirmed that the [facility] office manages suboxone - $125/week or $375/month plus the cost of the medication.  Call placed to [place] #  [phone] regarding suboxone treatment. Spoke to [employer] who explained they are a Energy manager that just started and they should be able to help the patient.  No cost to the patient.  He asked that the patient call him to discuss. His # [phone].   Call placed to the patient and explained above information from [place]. Provided her with his phone number and she said she would call. Instructed her to call this CM back with any questions.

## 2021-05-23 NOTE — Progress Notes (Signed)
Let patient know that her anemia is slightly worse compared to when she left the hospital.  I recommend taking iron supplement daily for at least 1 to 2 months.  Prescription sent to our pharmacy for pickup.  I will hold off on restarting Coumadin because her blood count has not stabilized as yet.  Kidney function not 100% but stable.

## 2021-05-28 ENCOUNTER — Other Ambulatory Visit: Payer: Self-pay

## 2021-05-30 ENCOUNTER — Telehealth: Payer: Self-pay

## 2021-05-30 NOTE — Telephone Encounter (Signed)
Pt given results per notes of Dr. Wynetta Emery on 05/23/21. Pt verbalized understanding.

## 2021-06-01 ENCOUNTER — Other Ambulatory Visit (HOSPITAL_BASED_OUTPATIENT_CLINIC_OR_DEPARTMENT_OTHER): Payer: Self-pay

## 2021-06-01 MED ORDER — BUPRENORPHINE HCL-NALOXONE HCL 8-2 MG SL SUBL
SUBLINGUAL_TABLET | SUBLINGUAL | 0 refills | Status: DC
  Filled 2021-06-01: qty 14, 7d supply, fill #0

## 2021-06-02 ENCOUNTER — Telehealth: Payer: Self-pay | Admitting: Internal Medicine

## 2021-06-02 NOTE — Telephone Encounter (Signed)
-----   Message from Ena Dawley sent at 06/01/2021  4:58 PM EST ----- Regarding: RE: Nephrology and cardiology referrals Nephrology referral is Under review   and cardiology  they were trying to contact patient  no return call and mailed a letter to patient   General 06/01/2021 9:55 AM Sheryn Bison, [place] Referral - Note   Multiple attempts made to contact patient with no response, expunge letter sent, referral closed kst      ----- Message ----- From: Ladell Pier, MD Sent: 05/22/2021  10:07 PM EST To: Ena Dawley Subject: Nephrology and cardiology referrals            Please try to get her in with nephrology and cardiology as soon as possible for post hospital follow-up.

## 2021-06-08 ENCOUNTER — Other Ambulatory Visit (HOSPITAL_BASED_OUTPATIENT_CLINIC_OR_DEPARTMENT_OTHER): Payer: Self-pay

## 2021-06-08 MED ORDER — GABAPENTIN 300 MG PO CAPS
ORAL_CAPSULE | ORAL | 0 refills | Status: AC
  Filled 2021-06-08: qty 42, 15d supply, fill #0

## 2021-06-08 MED ORDER — BUPRENORPHINE HCL-NALOXONE HCL 8-2 MG SL SUBL
SUBLINGUAL_TABLET | SUBLINGUAL | 0 refills | Status: AC
  Filled 2021-06-08: qty 28, 14d supply, fill #0

## 2021-06-22 ENCOUNTER — Other Ambulatory Visit (HOSPITAL_BASED_OUTPATIENT_CLINIC_OR_DEPARTMENT_OTHER): Payer: Self-pay

## 2021-06-25 ENCOUNTER — Other Ambulatory Visit: Payer: Self-pay

## 2021-07-05 ENCOUNTER — Ambulatory Visit: Payer: Medicaid - Out of State | Admitting: Internal Medicine

## 2021-07-13 ENCOUNTER — Other Ambulatory Visit (HOSPITAL_COMMUNITY): Payer: Self-pay

## 2021-07-17 ENCOUNTER — Other Ambulatory Visit: Payer: Self-pay | Admitting: Internal Medicine

## 2021-07-17 DIAGNOSIS — I10 Essential (primary) hypertension: Secondary | ICD-10-CM

## 2021-07-17 DIAGNOSIS — I5022 Chronic systolic (congestive) heart failure: Secondary | ICD-10-CM

## 2021-07-17 NOTE — Telephone Encounter (Signed)
Medication Refill - Medication:  carvedilol (COREG) 12.5 MG tablet   Has the patient contacted their pharmacy? Yes.   Contact PCP-pt needed appt, now has one scheduled  Preferred Pharmacy (with phone number or street name):  [place] [facility] Phone:  [phone]  Fax:  [phone]      Has the patient been seen for an appointment in the last year OR does the patient have an upcoming appointment? Yes.    Agent: Please be advised that RX refills may take up to 3 business days. We ask that you follow-up with your pharmacy.

## 2021-07-18 ENCOUNTER — Other Ambulatory Visit: Payer: Self-pay

## 2021-07-18 MED ORDER — CARVEDILOL 12.5 MG PO TABS
12.5000 mg | ORAL_TABLET | Freq: Two times a day (BID) | ORAL | 1 refills | Status: AC
  Filled 2021-07-18: qty 60, 30d supply, fill #0

## 2021-07-18 NOTE — Telephone Encounter (Signed)
Requested medication (s) are due for refill today: Refills available  Requested medication (s) are on the active medication list: yes    Last refill: 05/22/21  #180  3 refills  Future visit scheduled yes 09/11/21  Notes to clinic:Failed due to labs, please review. Thank you.  Requested Prescriptions  Pending Prescriptions Disp Refills   carvedilol (COREG) 12.5 MG tablet 180 tablet 3    Sig: Take 1 tablet (12.5 mg total) by mouth 2 (two) times daily with a meal.     Cardiovascular: Beta Blockers 3 Failed - 07/17/2021  3:28 PM      Failed - Cr in normal range and within 360 days    Creatinine  Date Value Ref Range Status  11/20/2011 0.67 0.60 - 1.30 mg/dL Final   Creatinine, Ser  Date Value Ref Range Status  05/22/2021 2.06 (H) 0.57 - 1.00 mg/dL Final   Creatinine, Urine  Date Value Ref Range Status  04/16/2021 36.46 mg/dL Final          Failed - Last BP in normal range    BP Readings from Last 1 Encounters:  05/22/21 (!) 160/100          Passed - AST in normal range and within 360 days    AST  Date Value Ref Range Status  05/22/2021 21 0 - 40 IU/L Final   SGOT(AST)  Date Value Ref Range Status  11/20/2011 40 (H) 15 - 37 Unit/L Final          Passed - ALT in normal range and within 360 days    ALT  Date Value Ref Range Status  05/22/2021 11 0 - 32 IU/L Final   SGPT (ALT)  Date Value Ref Range Status  11/20/2011 35 U/L Final    Comment:    12-78 NOTE: NEW REFERENCE RANGE 05/10/2011           Passed - Last Heart Rate in normal range    Pulse Readings from Last 1 Encounters:  05/22/21 74          Passed - Valid encounter within last 6 months    Recent Outpatient Visits           1 month ago Establishing care with new doctor, encounter for   Bracey, MD       Future Appointments             In 1 month Ladell Pier, MD [place]

## 2021-07-23 ENCOUNTER — Other Ambulatory Visit: Payer: Self-pay

## 2021-07-23 ENCOUNTER — Other Ambulatory Visit: Payer: Self-pay | Admitting: Internal Medicine

## 2021-07-24 ENCOUNTER — Other Ambulatory Visit: Payer: Self-pay

## 2021-07-24 MED ORDER — FERROUS SULFATE 325 (65 FE) MG PO TABS
325.0000 mg | ORAL_TABLET | Freq: Every day | ORAL | 0 refills | Status: AC
Start: 2021-07-24 — End: ?
  Filled 2021-07-24: qty 60, 60d supply, fill #0

## 2021-07-30 ENCOUNTER — Other Ambulatory Visit: Payer: Self-pay

## 2021-09-11 ENCOUNTER — Ambulatory Visit: Payer: 59 | Admitting: Internal Medicine

## 2022-05-29 ENCOUNTER — Inpatient Hospital Stay
Admission: RE | Admit: 2022-05-29 | Discharge: 2022-05-29 | Disposition: A | Discharge status: Home / Self Care | Payer: Medicare (Managed Care) | Source: Ambulatory Visit | Attending: Internal Medicine | Admitting: Internal Medicine

## 2022-05-29 ENCOUNTER — Other Ambulatory Visit: Payer: Self-pay

## 2022-05-29 DIAGNOSIS — R9431 Abnormal electrocardiogram [ECG] [EKG]: Secondary | ICD-10-CM | POA: Insufficient documentation

## 2022-05-29 DIAGNOSIS — I517 Cardiomegaly: Secondary | ICD-10-CM | POA: Insufficient documentation

## 2022-05-29 DIAGNOSIS — I38 Endocarditis, valve unspecified: Secondary | ICD-10-CM

## 2022-05-29 LAB — ECG 12 LEAD
Atrial Rate: 65 {beats}/min
Calculated P Axis: 80 degrees
Calculated R Axis: 75 degrees
Calculated T Axis: 54 degrees
PR Interval: 162 ms
QRS Duration: 78 ms
QT Interval: 418 ms
QTC Calculation: 434 ms
Ventricular rate: 65 {beats}/min

## 2022-10-29 IMAGING — US US RENAL
1 series · 14 of 25 positions shown · non-contrast
Comparison: 04/15/2021

CLINICAL DATA: Anemia, renal biopsy 04/18/2021

EXAM:
RENAL / URINARY TRACT ULTRASOUND COMPLETE

[Series 1: us renal · 58 acquisitions, 14 frames shown]
[im 1/58]
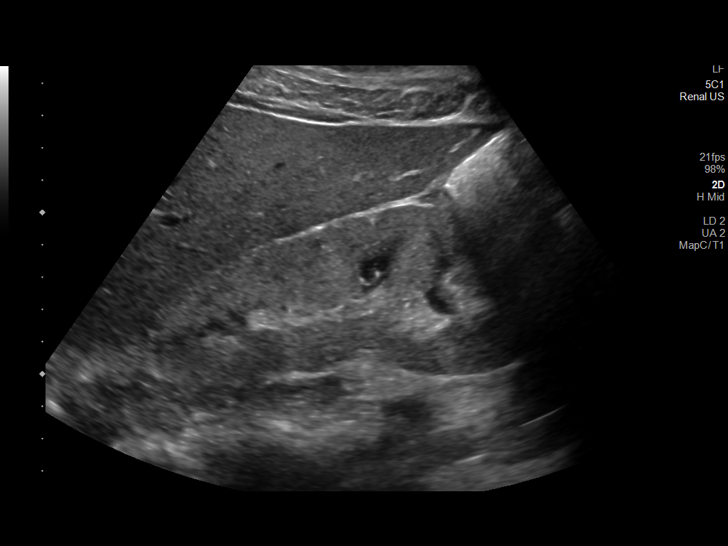
[im 5/58]
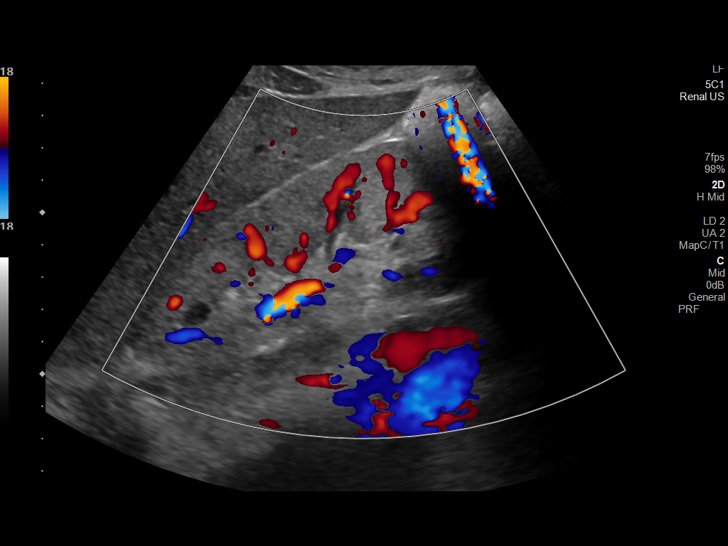
[im 10/58]
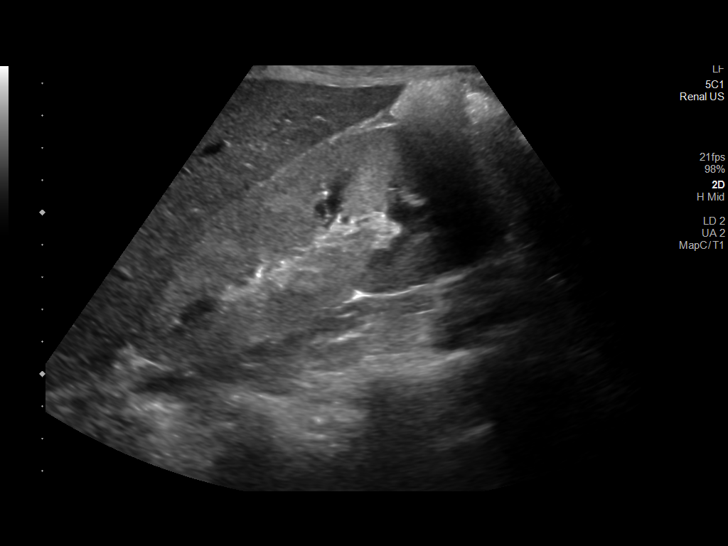
[im 15/58]
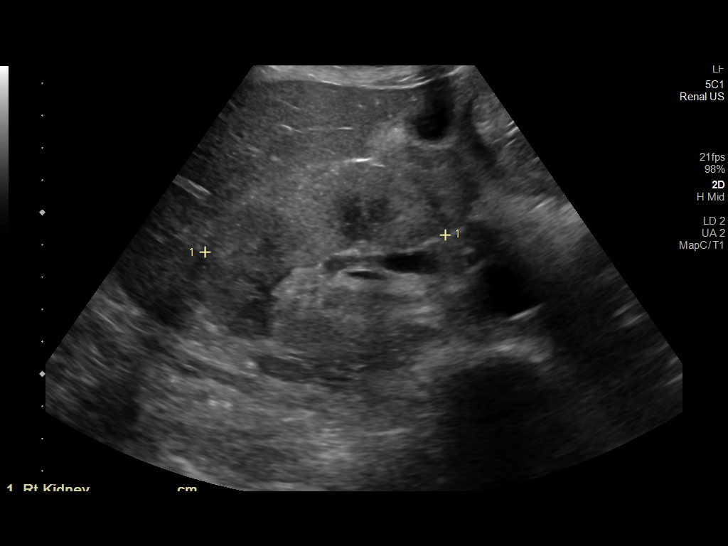
[im 20/58]
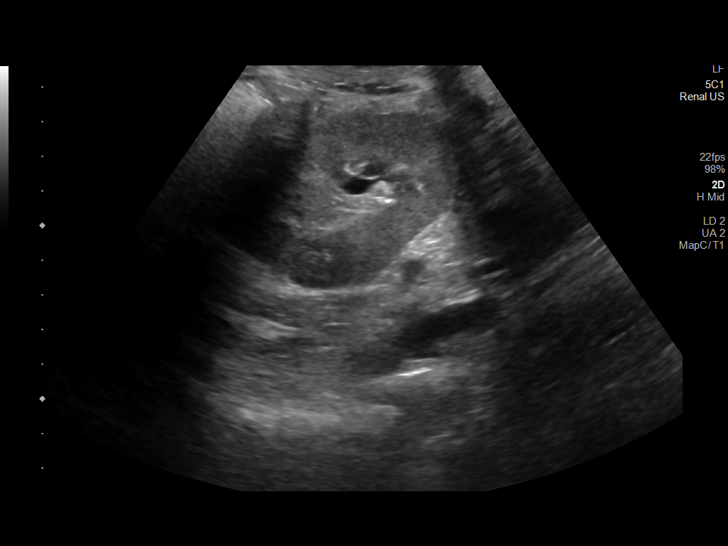
[im 22/58]
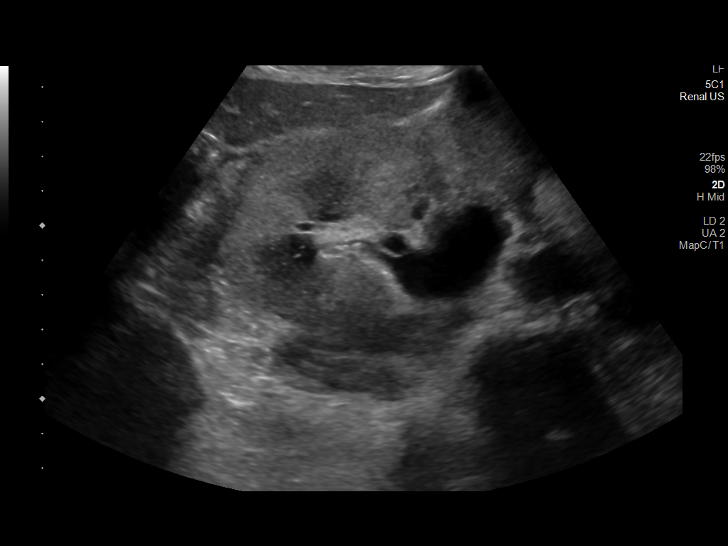
[im 27/58]
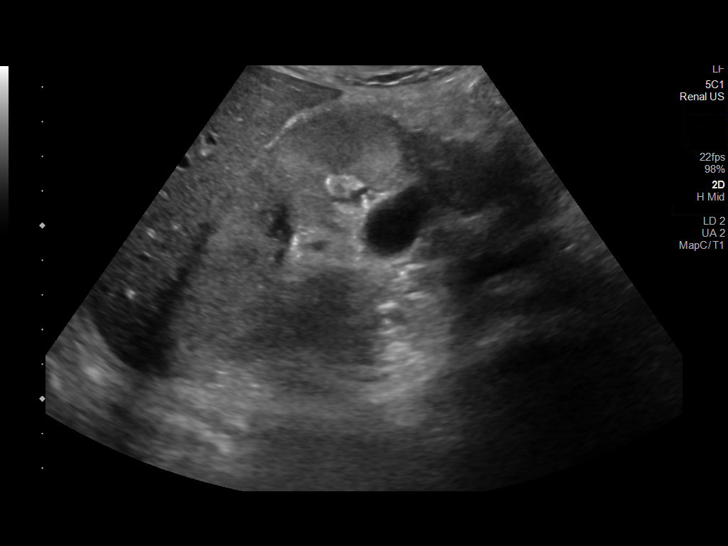
[im 31/58]
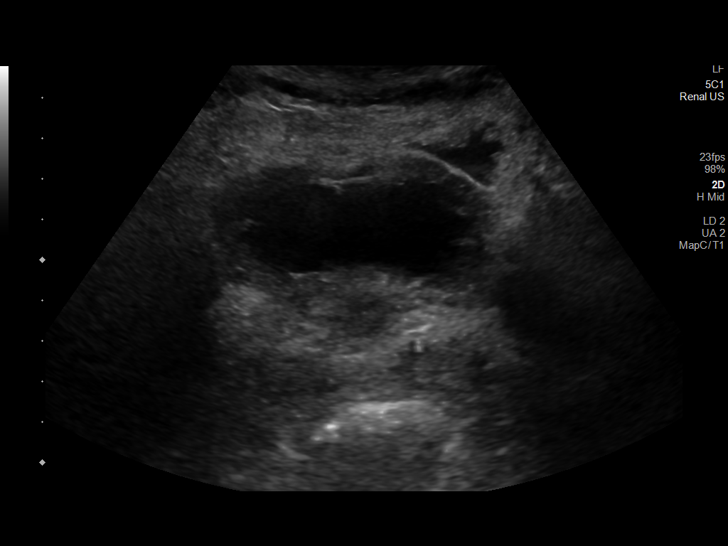
[im 36/58]
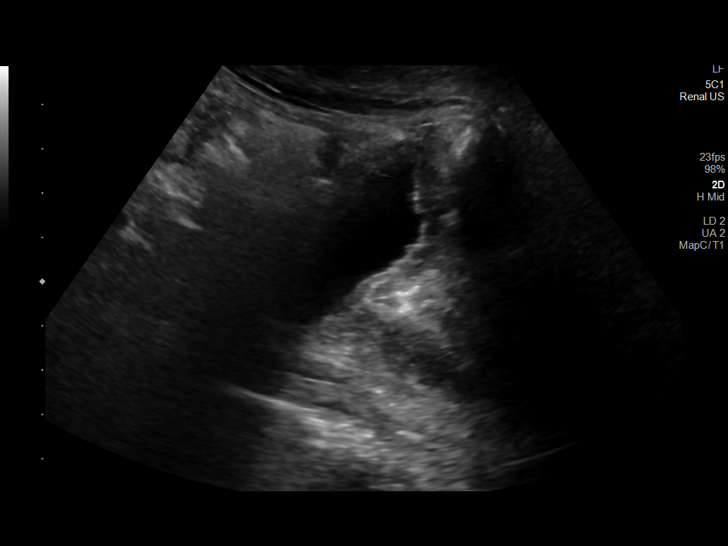
[im 39/58]
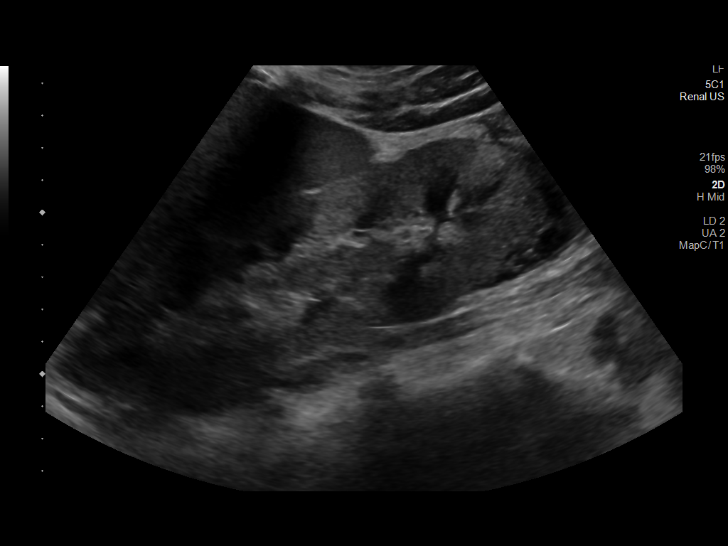
[im 43/58]
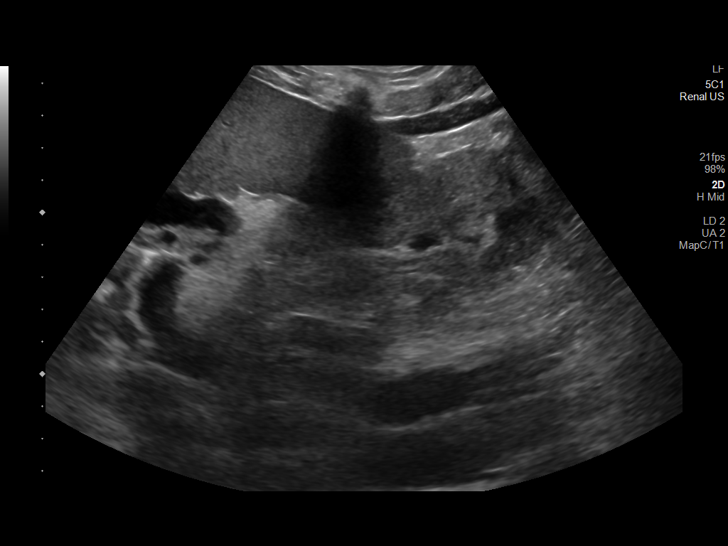
[im 48/58]
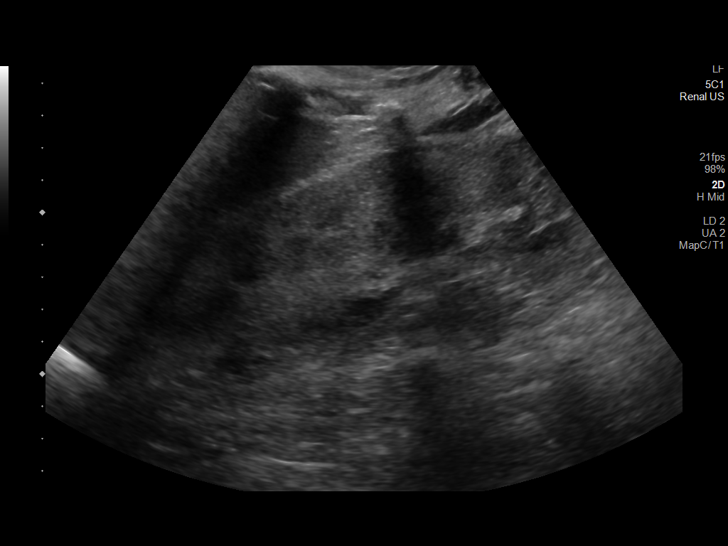
[im 53/58]
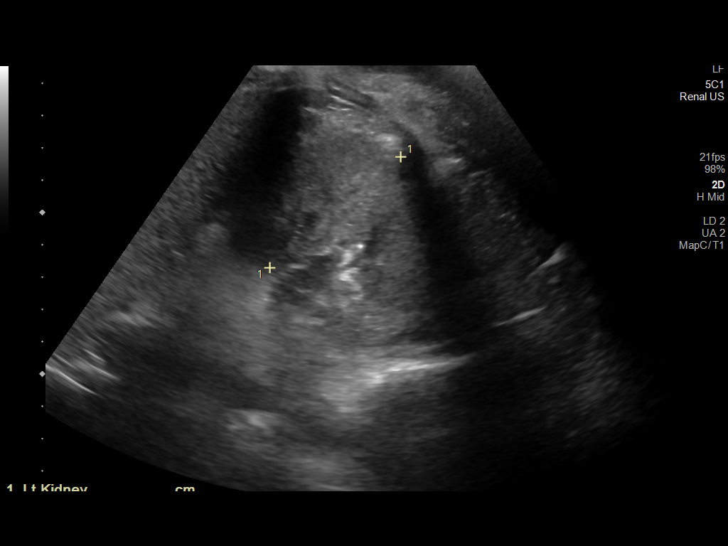
[im 58/58]
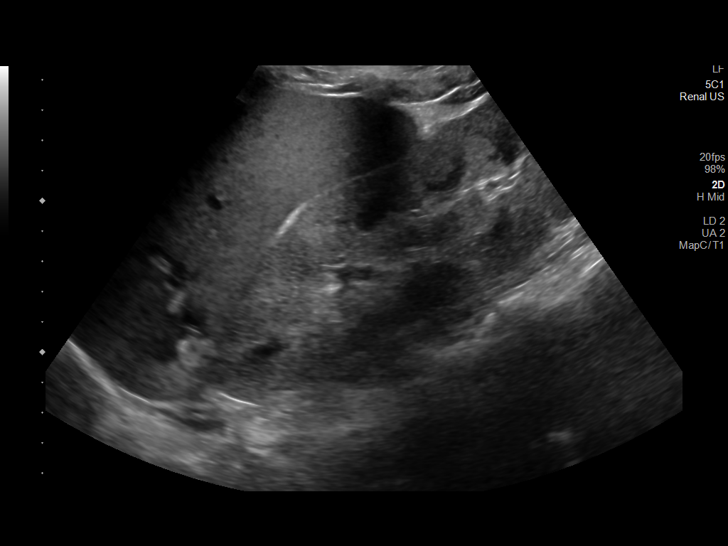

[14 of 25 positions shown; findings below may reference images not displayed]

FINDINGS: Right Kidney:

Renal measurements: 12.4 x 5.0 x 7.5 cm = volume: 242 mL. Normal
cortical thickness. Increased cortical echogenicity. No mass or
shadowing calcification. Mild pelviectasis noted, unchanged.

Left Kidney:

Renal measurements: 16.0 x 5.3 x 5.3 cm = volume: 234 mL. Normal
cortical thickness. Increased cortical echogenicity. No mass,
hydronephrosis, or shadowing calcification.

Bladder:

Contains only a small amount of urine, no gross abnormality seen.

Other:

No perinephric collection/hematoma identified
IMPRESSION: Medical renal disease changes of both kidneys.

Stable mild pelviectasis of RIGHT kidney.

## 2022-11-21 ENCOUNTER — Other Ambulatory Visit: Payer: Self-pay

## 2022-11-21 ENCOUNTER — Other Ambulatory Visit: Payer: Medicare (Managed Care) | Attending: Emergency Medicine

## 2022-11-21 DIAGNOSIS — L400 Psoriasis vulgaris: Secondary | ICD-10-CM | POA: Insufficient documentation

## 2022-11-21 LAB — COMPREHENSIVE METABOLIC PANEL, NON-FASTING
ALBUMIN/GLOBULIN RATIO: 1.5 — ABNORMAL HIGH (ref 0.8–1.4)
ALBUMIN: 3.8 g/dL (ref 3.5–5.7)
ALKALINE PHOSPHATASE: 58 U/L (ref 34–104)
ALT (SGPT): 20 U/L (ref 7–52)
ANION GAP: 4 mmol/L (ref 4–13)
AST (SGOT): 32 U/L (ref 13–39)
BILIRUBIN TOTAL: 0.7 mg/dL (ref 0.3–1.2)
BUN/CREA RATIO: 12 (ref 6–22)
BUN: 13 mg/dL (ref 7–25)
CALCIUM, CORRECTED: 9.3 mg/dL (ref 8.9–10.8)
CALCIUM: 9.1 mg/dL (ref 8.6–10.3)
CHLORIDE: 106 mmol/L (ref 98–107)
CO2 TOTAL: 29 mmol/L (ref 21–31)
CREATININE: 1.09 mg/dL (ref 0.60–1.30)
ESTIMATED GFR: 67 mL/min/{1.73_m2} (ref >59)
GLOBULIN: 2.5 — ABNORMAL LOW (ref 2.9–5.4)
GLUCOSE: 79 mg/dL (ref 74–109)
OSMOLALITY, CALCULATED: 277 mOsm/kg (ref 270–290)
POTASSIUM: 3.8 mmol/L (ref 3.5–5.1)
PROTEIN TOTAL: 6.3 g/dL — ABNORMAL LOW (ref 6.4–8.9)
SODIUM: 139 mmol/L (ref 136–145)

## 2022-11-21 LAB — CBC WITH DIFF
BASOPHIL #: 0 10*3/uL (ref 0.00–0.10)
BASOPHIL %: 1 % (ref 0–1)
EOSINOPHIL #: 0.1 10*3/uL (ref 0.00–0.50)
EOSINOPHIL %: 1 %
HCT: 46.9 % — ABNORMAL HIGH (ref 31.2–41.9)
HGB: 15.8 g/dL — ABNORMAL HIGH (ref 10.9–14.3)
LYMPHOCYTE #: 3 10*3/uL (ref 1.00–3.00)
LYMPHOCYTE %: 35 % (ref 16–44)
MCH: 30.2 pg (ref 24.7–32.8)
MCHC: 33.8 g/dL (ref 32.3–35.6)
MCV: 89.2 fL (ref 75.5–95.3)
MONOCYTE #: 0.7 10*3/uL (ref 0.30–1.00)
MONOCYTE %: 8 % (ref 5–13)
MPV: 8.3 fL (ref 7.9–10.8)
NEUTROPHIL #: 4.7 10*3/uL (ref 1.85–7.80)
NEUTROPHIL %: 56 % (ref 43–77)
PLATELETS: 174 10*3/uL (ref 140–440)
RBC: 5.25 10*6/uL — ABNORMAL HIGH (ref 3.63–4.92)
RDW: 14 % (ref 12.3–17.7)
WBC: 8.5 10*3/uL (ref 3.8–11.8)

## 2022-11-25 LAB — QUANTIFERON TB MITOGEN: QFT MITOGEN: >10 IU/mL

## 2022-11-25 LAB — QUANTIFERON TB NIL: QFT NIL: 0.132 IU/mL

## 2022-11-25 LAB — QUANTIFERON TB2: QFT TB2: 0.39 IU/mL

## 2022-11-25 LAB — QUANTIFERON
QFT MITOGEN: >10 IU/mL
QFT NIL: 0.132 IU/mL
QFT QUALITATIVE: NEGATIVE
QFT TB1: 0.3 IU/mL
QFT TB2: 0.39 IU/mL

## 2022-11-25 LAB — QUANTIFERON TB1: QFT TB1: 0.3 IU/mL

## 2023-02-17 ENCOUNTER — Emergency Department
Admission: EM | Admit: 2023-02-17 | Discharge: 2023-02-18 | Disposition: A | Discharge status: Home / Self Care | Payer: Medicare (Managed Care) | Attending: Emergency Medicine | Admitting: Emergency Medicine

## 2023-02-17 ENCOUNTER — Inpatient Hospital Stay (HOSPITAL_BASED_OUTPATIENT_CLINIC_OR_DEPARTMENT_OTHER): Payer: Medicare (Managed Care)

## 2023-02-17 ENCOUNTER — Emergency Department (HOSPITAL_BASED_OUTPATIENT_CLINIC_OR_DEPARTMENT_OTHER): Payer: Medicare (Managed Care)

## 2023-02-17 ENCOUNTER — Other Ambulatory Visit: Payer: Self-pay

## 2023-02-17 DIAGNOSIS — Z1152 Encounter for screening for COVID-19: Secondary | ICD-10-CM | POA: Insufficient documentation

## 2023-02-17 DIAGNOSIS — Z3200 Encounter for pregnancy test, result unknown: Secondary | ICD-10-CM | POA: Insufficient documentation

## 2023-02-17 DIAGNOSIS — R001 Bradycardia, unspecified: Secondary | ICD-10-CM | POA: Insufficient documentation

## 2023-02-17 DIAGNOSIS — R0789 Other chest pain: Secondary | ICD-10-CM

## 2023-02-17 DIAGNOSIS — J209 Acute bronchitis, unspecified: Secondary | ICD-10-CM | POA: Insufficient documentation

## 2023-02-17 LAB — CBC WITH DIFF
BASOPHIL #: 0.02 10*3/uL (ref 0.00–0.30)
BASOPHIL %: 0 % (ref 0–3)
EOSINOPHIL #: 0.09 10*3/uL (ref 0.00–0.80)
EOSINOPHIL %: 1 % (ref 1–7)
HCT: 42.4 % (ref 37.0–47.0)
HGB: 14.4 g/dL (ref 12.5–16.0)
LYMPHOCYTE #: 2.38 10*3/uL (ref 1.10–5.00)
LYMPHOCYTE %: 33 % (ref 25–45)
MCH: 31 pg (ref 27.0–32.0)
MCHC: 34.1 g/dL (ref 32.0–36.0)
MCV: 91 fL (ref 78.0–99.0)
MONOCYTE #: 0.5 10*3/uL (ref 0.00–1.30)
MONOCYTE %: 7 % (ref 0–12)
MPV: 7.4 fL (ref 7.4–10.4)
NEUTROPHIL #: 4.32 10*3/uL (ref 1.80–8.40)
NEUTROPHIL %: 59 % (ref 40–76)
PLATELETS: 237 10*3/uL (ref 140–440)
RBC: 4.66 10*6/uL (ref 4.20–5.40)
RDW: 16.7 % — ABNORMAL HIGH (ref 11.6–14.8)
WBC: 7.3 10*3/uL (ref 4.0–10.5)

## 2023-02-17 LAB — COMPREHENSIVE METABOLIC PANEL, NON-FASTING
ALBUMIN/GLOBULIN RATIO: 0.8 (ref 0.8–1.4)
ALBUMIN: 2.9 g/dL — ABNORMAL LOW (ref 3.4–5.0)
ALKALINE PHOSPHATASE: 90 U/L (ref 46–116)
ALT (SGPT): 23 U/L (ref <=78)
ANION GAP: 6 mmol/L (ref 4–13)
AST (SGOT): 28 U/L (ref 15–37)
BILIRUBIN TOTAL: 0.5 mg/dL (ref 0.2–1.0)
BUN/CREA RATIO: 11
BUN: 13 mg/dL (ref 7–18)
CALCIUM, CORRECTED: 9.4 mg/dL
CALCIUM: 8.5 mg/dL (ref 8.5–10.1)
CHLORIDE: 106 mmol/L (ref 98–107)
CO2 TOTAL: 28 mmol/L (ref 21–32)
CREATININE: 1.15 mg/dL — ABNORMAL HIGH (ref 0.55–1.02)
ESTIMATED GFR: 63 mL/min/{1.73_m2} (ref >59)
GLOBULIN: 3.6
GLUCOSE: 73 mg/dL — ABNORMAL LOW (ref 74–106)
OSMOLALITY, CALCULATED: 278 mOsm/kg (ref 270–290)
POTASSIUM: 3.3 mmol/L — ABNORMAL LOW (ref 3.5–5.1)
PROTEIN TOTAL: 6.5 g/dL (ref 6.4–8.2)
SODIUM: 140 mmol/L (ref 136–145)

## 2023-02-17 LAB — COVID-19, FLU A/B, RSV RAPID BY PCR
INFLUENZA VIRUS TYPE A: NOT DETECTED
INFLUENZA VIRUS TYPE B: NOT DETECTED
RESPIRATORY SYNCTIAL VIRUS (RSV): NOT DETECTED
SARS-CoV-2: NOT DETECTED

## 2023-02-17 LAB — TROPONIN-I
TROPONIN I: 6 ng/L (ref <15)
TROPONIN I: 5 ng/L (ref <15)

## 2023-02-17 LAB — HCG QUALITATIVE PREGNANCY, SERUM: PREGNANCY, SERUM QUALITATIVE: NEGATIVE

## 2023-02-17 LAB — D-DIMER: D-DIMER: 814 ng/mL FEU (ref 215–500)

## 2023-02-17 MED ORDER — SODIUM CHLORIDE 0.9 % (FLUSH) INJECTION SYRINGE
3.0000 mL | INJECTION | INTRAMUSCULAR | Status: DC | PRN
Start: 2023-02-17 — End: 2023-02-18

## 2023-02-17 MED ORDER — SODIUM CHLORIDE 0.9 % (FLUSH) INJECTION SYRINGE
3.0000 mL | INJECTION | Freq: Three times a day (TID) | INTRAMUSCULAR | Status: DC
Start: 2023-02-17 — End: 2023-02-18
  Administered 2023-02-17: 3 mL

## 2023-02-17 MED ORDER — ASPIRIN 81 MG CHEWABLE TABLET
324.0000 mg | CHEWABLE_TABLET | ORAL | Status: AC
Start: 2023-02-17 — End: 2023-02-17
  Administered 2023-02-17: 324 mg via ORAL

## 2023-02-17 MED ORDER — ASPIRIN 81 MG CHEWABLE TABLET
CHEWABLE_TABLET | ORAL | Status: AC
Start: 2023-02-17 — End: 2023-02-17
  Filled 2023-02-17: qty 4

## 2023-02-17 NOTE — ED Nurses Note (Signed)
Critical d-dimer reported to Dr Tawanna Solo of 814 at this time, called from lab. No new orders at this time.

## 2023-02-17 NOTE — ED Attending Note (Signed)
Patient transferred to my service at 11:00 p.m.    Patient awaiting troponin and CTA chest to rule out pulmonary embolus.      Atypical pneumonia versus pulmonary emboli

## 2023-02-17 NOTE — ED Triage Notes (Signed)
Pt states rt shoulder pain 3 days ago and then tonight mid chest pain dull, tightness, especially when lying flat. Dry cough starting today as well. Pt hx tricuspid replacement 2022.

## 2023-02-17 NOTE — ED Provider Notes (Signed)
[facility], [facility] Emergency Department  ED Primary Provider Note  History of Present Illness   Chief Complaint   Patient presents with    Chest Pain     Shoulder Pain     Arrival: The patient arrived by Car  [name] [name] is a 37 y.o. female who  has rt shoulder pain and chest pain. Pt states in aug of 22 had tricuspid valve replaced at [facility] no drug use since then states had rt shoulder pain into chest inter sob. Sx pta.   Review of Systems   Constitutional: No fever, chills or weakness   Skin: No rash or diaphoresis  HENT: No headaches, or congestion  Eyes: No vision changes or photophobia   Cardio: + chest pain,  no palpitations or leg swelling   Respiratory: No cough, wheezing + interm  SOB  GI:  No nausea, vomiting or stool changes  GU:  No dysuria, hematuria, or increased frequency  MSK: No muscle aches, joint or back pain  Neuro: No seizures, LOC, numbness, tingling, or focal weakness  Psychiatric: No depression, SI or substance abuse  All other systems reviewed and are negative.    History Reviewed This Encounter: all noted and reviewed.     Physical Exam   ED Triage Vitals [02/17/23 2152]   BP (Non-Invasive) (!) 119/100   Heart Rate 58   Respiratory Rate 18   Temperature 36.7 C (98.1 F)   SpO2 100 %   Weight 67.1 kg (148 lb)   Height 1.702 m (5\' 7" )       Constitutional:  37 y.o. female who appears in no distress. Normal color, no cyanosis.   HENT:   Head: Normocephalic and atraumatic.   Mouth/Throat: Oropharynx is clear and moist.   Eyes: EOMI, PERRL   Neck: Trachea midline. Neck supple.  Cardiovascular: RRR, No murmurs, rubs or gallops. Intact distal pulses.  Pulmonary/Chest: BS equal bilaterally. No respiratory distress. No wheezes, rales or chest tenderness.   Abdominal: Bowel sounds present and normal. Abdomen soft, no tenderness, no rebound and no guarding.  Back: No midline spinal tenderness, no paraspinal tenderness, no CVA tenderness.           Musculoskeletal:  No edema, tenderness or deformity.  Skin: warm and dry. No rash, erythema, pallor or cyanosis  Psychiatric: normal mood and affect. Behavior is normal.   Neurological: Patient keenly alert and responsive, easily able to raise eyebrows, facial muscles/expressions symmetric, speaking in fluent sentences, moving all extremities equally and fully, normal gait  Patient Data     Labs Ordered/Reviewed   COMPREHENSIVE METABOLIC PANEL, NON-FASTING - Abnormal; Notable for the following components:       Result Value    POTASSIUM 3.3 (*)     CREATININE 1.15 (*)     ALBUMIN 2.9 (*)     GLUCOSE 73 (*)     All other components within normal limits    Narrative:     Estimated Glomerular Filtration Rate (eGFR) is calculated using the CKD-EPI (2021) equation, intended for patients 50 years of age and older. If gender is not documented or "unknown", there will be no eGFR calculation.   CBC WITH DIFF - Abnormal; Notable for the following components:    RDW 16.7 (*)     All other components within normal limits   D-DIMER - Abnormal; Notable for the following components:    D-DIMER 814 (*)     All other components within normal limits  Narrative:     D-Dimers are reported in FEU per ng/mL.    IF PATIENT IS EXHIBITING SYMPTOMS DVT/PE, THIS D-DIMER RESULT MAY INDICATE A NEED FOR FURTHER TESTING FOR THESE CONDITIONS. IF PATIENT IS SUSPECTED OF DIC AND SYMPTOMS WORSEN OR PERSIST, A REPEAT DIC WORKUP SHOULD BE CONSIDERED.    NOTE: ALTHOUGH THE NORMAL RANGE FOR THIS TEST IS 215-500 ng/mL FEU, LITERATURE RECOMMENDS FURTHER TESTING FOR ANY RESULT >500ng/mL FEU.    A cut off value of 500ng/mL FEU or below can be used as an aid in the diagnosis of Thromboembolism when used in conjunction with the patient's medical history, clinical presentation and other findings. Results of 500ng/mL FEU or below have a negative predictive value of 100%.     TROPONIN-I - Normal    Narrative:     Values received on females ranging between 12-15 ng/L MUST include  the next serial troponin to review changes in the delta differences as the reference range for the Access II chemistry analyzer is lower than the established reference range.     COVID-19, FLU A/B, RSV RAPID BY PCR - Normal    Narrative:     Results are for the simultaneous qualitative identification of SARS-CoV-2 (formerly 2019-nCoV), Influenza A, Influenza B, and RSV RNA. These etiologic agents are generally detectable in nasopharyngeal and nasal swabs during the ACUTE PHASE of infection. Hence, this test is intended to be performed on respiratory specimens collected from individuals with signs and symptoms of upper respiratory tract infection who meet Centers for Disease Control and Prevention (CDC) clinical and/or epidemiological criteria for Coronavirus Disease 2019 (COVID-19) testing. CDC COVID-19 criteria for testing on human specimens is available at [facility] webpage information for Healthcare Professionals: Coronavirus Disease 2019 (COVID-19) ([email or web address]).     False-negative results may occur if the virus has genomic mutations, insertions, deletions, or rearrangements or if performed very early in the course of illness. Otherwise, negative results indicate virus specific RNA targets are not detected, however negative results do not preclude SARS-CoV-2 infection/COVID-19, Influenza, or Respiratory syncytial virus infection. Results should not be used as the sole basis for patient management decisions. Negative results must be combined with clinical observations, patient history, and epidemiological information. If upper respiratory tract infection is still suspected based on exposure history together with other clinical findings, re-testing should be considered.    Test methodology:   Cepheid Xpert Xpress SARS-CoV-2/Flu/RSV Assay real-time polymerase chain reaction (RT-PCR) test on the GeneXpert Dx and Xpert Xpress systems.   HCG QUALITATIVE PREGNANCY, SERUM -  Normal   TROPONIN-I - Normal    Narrative:     Values received on females ranging between 12-15 ng/L MUST include the next serial troponin to review changes in the delta differences as the reference range for the Access II chemistry analyzer is lower than the established reference range.     CBC/DIFF    Narrative:     The following orders were created for panel order CBC/DIFF.  Procedure                               Abnormality         Status                     ---------                               -----------         ------  CBC WITH ZOXW[[id number]]                Abnormal            Final result                 Please view results for these tests on the individual orders.   TROPONIN-I     CT ANGIO CHEST FOR PULMONARY EMBOLUS W IV CONTRAST   Final Result by Edi, Radresults In (09/03 0110)   No pulmonary thromboembolism. No acute aortic abnormality.      There is bilateral atelectasis.      There is bronchial wall thickening which can be seen in smokers or in bronchitis.         One or more dose reduction techniques were used (e.g., Automated exposure control, adjustment of the mA and/or kV according to patient size, use of iterative reconstruction technique).         Radiologist location ID: [id number]         XR CHEST AP   Final Result by Edi, Radresults In (09/02 2243)   Question pneumonia within the bilateral upper lobes. Probable atelectasis in right lung base.            Radiologist location ID: [id number]           Medical Decision Making   Diff dx of mi pe cad.    First trop 6 wbc 7.3 potasium 3.3 EKG brady sinus rate. As of 2300 care left to dr. Tawanna Solo.       Medications Administered in the ED   NS flush syringe (3 mL Intracatheter Given 02/17/23 2200)   NS flush syringe (has no administration in time range)   aspirin chewable tablet 324 mg (324 mg Oral Given 02/17/23 2213)   iohexol (OMNIPAQUE 350) infusion (100 mL Intravenous Given 02/18/23 0030)     Clinical Impression   Atypical  chest pain (Primary)   Acute bronchitis, unspecified organism       Disposition: Discharged

## 2023-02-18 DIAGNOSIS — R9431 Abnormal electrocardiogram [ECG] [EKG]: Secondary | ICD-10-CM

## 2023-02-18 DIAGNOSIS — R001 Bradycardia, unspecified: Secondary | ICD-10-CM

## 2023-02-18 LAB — ECG 12 LEAD
Atrial Rate: 52 {beats}/min
Calculated P Axis: 66 degrees
Calculated R Axis: 47 degrees
Calculated T Axis: -1 degrees
PR Interval: 142 ms
QRS Duration: 84 ms
QT Interval: 466 ms
QTC Calculation: 433 ms
Ventricular rate: 52 {beats}/min

## 2023-02-18 LAB — TROPONIN-I: TROPONIN I: 6 ng/L (ref <15)

## 2023-02-18 MED ORDER — IOHEXOL 350 MG IODINE/ML INTRAVENOUS SOLUTION
100.0000 mL | INTRAVENOUS | Status: AC
Start: 2023-02-18 — End: 2023-02-18
  Administered 2023-02-18: 100 mL via INTRAVENOUS

## 2023-02-18 MED ORDER — AZITHROMYCIN 250 MG TABLET
ORAL_TABLET | ORAL | 0 refills | Status: AC
Start: 2023-02-18 — End: ?

## 2023-02-18 NOTE — ED Nurses Note (Signed)
Patient discharged home with family.  AVS reviewed with patient/care giver.  A written copy of the AVS and discharge instructions was given to the patient/care giver. Scripts called to pharmacy of choice. Questions sufficiently answered as needed.  Patient/care giver encouraged to follow up with PCP as indicated.  In the event of an emergency, patient/care giver instructed to call 911 or go to the nearest emergency room.

## 2023-02-18 NOTE — Discharge Instructions (Addendum)
-   Take Tylenol or ibuprofen every 4-6 hours as needed for pain
